# Patient Record
Sex: Male | Born: 1985 | Race: Black or African American | Hispanic: No | Marital: Single | Smoking: Current every day smoker
Health system: Southern US, Community
[De-identification: ages and names within clinical notes are randomized; demographics above are authoritative.]

## PROBLEM LIST (undated history)

## (undated) DIAGNOSIS — C189 Malignant neoplasm of colon, unspecified: Secondary | ICD-10-CM

## (undated) DIAGNOSIS — F319 Bipolar disorder, unspecified: Secondary | ICD-10-CM

## (undated) DIAGNOSIS — F4310 Post-traumatic stress disorder, unspecified: Secondary | ICD-10-CM

## (undated) HISTORY — DX: Malignant neoplasm of colon, unspecified: C18.9

---

## 2017-10-12 ENCOUNTER — Other Ambulatory Visit: Payer: Self-pay

## 2017-10-12 ENCOUNTER — Emergency Department
Admission: EM | Admit: 2017-10-12 | Discharge: 2017-10-12 | Disposition: A | Discharge status: Home / Self Care | Payer: Medicaid Other | Attending: Emergency Medicine | Admitting: Emergency Medicine

## 2017-10-12 ENCOUNTER — Encounter: Payer: Self-pay | Admitting: Emergency Medicine

## 2017-10-12 DIAGNOSIS — E860 Dehydration: Secondary | ICD-10-CM | POA: Diagnosis not present

## 2017-10-12 DIAGNOSIS — R55 Syncope and collapse: Secondary | ICD-10-CM

## 2017-10-12 DIAGNOSIS — F17210 Nicotine dependence, cigarettes, uncomplicated: Secondary | ICD-10-CM | POA: Insufficient documentation

## 2017-10-12 HISTORY — DX: Post-traumatic stress disorder, unspecified: F43.10

## 2017-10-12 HISTORY — DX: Bipolar disorder, unspecified: F31.9

## 2017-10-12 LAB — CBC
HCT: 42.8 % (ref 40.0–52.0)
HEMOGLOBIN: 14.5 g/dL (ref 13.0–18.0)
MCH: 31.5 pg (ref 26.0–34.0)
MCHC: 33.8 g/dL (ref 32.0–36.0)
MCV: 93.3 fL (ref 80.0–100.0)
Platelets: 238 10*3/uL (ref 150–440)
RBC: 4.59 MIL/uL (ref 4.40–5.90)
RDW: 12.9 % (ref 11.5–14.5)
WBC: 6.8 10*3/uL (ref 3.8–10.6)

## 2017-10-12 LAB — URINALYSIS, COMPLETE (UACMP) WITH MICROSCOPIC
Bacteria, UA: NONE SEEN
Bilirubin Urine: NEGATIVE
Glucose, UA: NEGATIVE mg/dL
Hgb urine dipstick: NEGATIVE
KETONES UR: NEGATIVE mg/dL
LEUKOCYTES UA: NEGATIVE
NITRITE: NEGATIVE
PH: 8 (ref 5.0–8.0)
Protein, ur: NEGATIVE mg/dL
Specific Gravity, Urine: 1.01 (ref 1.005–1.030)
Squamous Epithelial / LPF: NONE SEEN (ref 0–5)

## 2017-10-12 LAB — BASIC METABOLIC PANEL
ANION GAP: 6 (ref 5–15)
BUN: 10 mg/dL (ref 6–20)
CALCIUM: 8.7 mg/dL — AB (ref 8.9–10.3)
CHLORIDE: 108 mmol/L (ref 98–111)
CO2: 24 mmol/L (ref 22–32)
Creatinine, Ser: 1.06 mg/dL (ref 0.61–1.24)
Glucose, Bld: 123 mg/dL — ABNORMAL HIGH (ref 70–99)
POTASSIUM: 4 mmol/L (ref 3.5–5.1)
SODIUM: 138 mmol/L (ref 135–145)

## 2017-10-12 LAB — URINE DRUG SCREEN, QUALITATIVE (ARMC ONLY)
AMPHETAMINES, UR SCREEN: NOT DETECTED
BENZODIAZEPINE, UR SCRN: NOT DETECTED
COCAINE METABOLITE, UR ~~LOC~~: NOT DETECTED
Cannabinoid 50 Ng, Ur ~~LOC~~: NOT DETECTED
MDMA (Ecstasy)Ur Screen: NOT DETECTED
METHADONE SCREEN, URINE: NOT DETECTED
OPIATE, UR SCREEN: NOT DETECTED
PHENCYCLIDINE (PCP) UR S: NOT DETECTED
Tricyclic, Ur Screen: NOT DETECTED

## 2017-10-12 LAB — TROPONIN I: Troponin I: <0.03 ng/mL (ref <0.03)

## 2017-10-12 MED ORDER — SODIUM CHLORIDE 0.9 % IV BOLUS
1000.0000 mL | Freq: Once | INTRAVENOUS | Status: AC
  Administered 2017-10-12: 1000 mL via INTRAVENOUS

## 2017-10-12 NOTE — Discharge Instructions (Signed)

## 2017-10-12 NOTE — ED Notes (Signed)
Spoke with Dorris Singh from group home who states that pt is new to him but pt has told him that he has hx/o a seizure while he was in jail. Per [place], pt was not diagnosed with seizure disorder.

## 2017-10-12 NOTE — ED Notes (Signed)
Pt reports that this morning when he woke up that the was not feeling well. Pt states that he went outside to smoke and while sitting outside he had a syncopal episode. Pt reports that has had similar episodes in the past. Pt states that he is having generalized weakness but denies any pain currently. Pt is A & O x 4.

## 2017-10-12 NOTE — ED Triage Notes (Signed)
Pt to ED via [facility] from group home for syncopal episode this morning. Pt hypotensive on scene at 77/51, Sinus brady on monitor, c/o weakness and pain all over. CBG 117. Pt give 500 bolus in NS in route with blood pressure improving to 108/64 with HR in the mid 60's. Pt has hx/o PTSD.  Pt currently c/o weakness, in NAD at this time.

## 2017-10-12 NOTE — ED Notes (Signed)
Pt given sandwich tray and sprite.  

## 2017-10-12 NOTE — ED Provider Notes (Signed)
[facility] Emergency Department Provider Note  ____________________________________________   First MD Initiated Contact with Patient 10/12/17 [phone]     (approximate)  I have reviewed the triage vital signs and the nursing notes.   HISTORY  Chief Complaint Near Syncope    HPI Connor Wilkinson is a 32 y.o. male reports a previous history of seizure, but no other major history except for depression  Patient reports that he went outside, was sitting in a chair smoking a cigarette when he became lightheaded felt as though he was going to faint and then fainted.  Evidently he did not fall, EMS reports on scene he arrived and the patient was seated in a chair blood pressure 70/40 and his heart rate was also in the 40s.  He was somnolent at that time, but alert.  Patient reports he felt like he was going to blackout, felt very weak and tired during the episode which lasted a few minutes.  No chest pain or trouble breathing.  No fevers or chills.  No headache.  He did not bite his tongue, did not urinate on himself.  He also relates that over the last year he has had 2 other episodes one where he went to a hospital and reports he was told they did not find anything wrong, and then had another episode a few months back where he passed out similarly but did not seek medical care  Denies any fall or injury.  No headaches.  Not complaining of any chest pain or chest discomfort.  No fevers or chills  Patient reports he feels a whole lot better now has no complaints.  Was given 500 mL's of normal saline by EMS with improvement in heart rate and blood pressure.  EMS reports he seems much improved now.  Reports he was out yesterday in the hot sun fair amount, did not drink much water.  Also reports he had not had anything to eat or drink yet this morning.  Past Medical History:  Diagnosis Date  . Bipolar 1 disorder ([place])   . PTSD (post-traumatic stress disorder)      There are no active problems to display for this patient.  No leg swelling.  No history of blood clots.   Prior to Admission medications   Not on File    Allergies Patient has no allergy information on record.  No family history on file.  Social History Social History   Tobacco Use  . Smoking status: Current Every Day Smoker  . Smokeless tobacco: Never Used  Substance Use Topics  . Alcohol use: Not Currently  . Drug use: Not Currently  Denies drug use Reports he is a smoker  Review of Systems Constitutional: No fever/chills Eyes: No visual changes. ENT: No sore throat. Cardiovascular: Denies chest pain. Respiratory: Denies shortness of breath. Gastrointestinal: No abdominal pain.  No nausea, no vomiting.  No diarrhea.  No constipation. Genitourinary: Negative for dysuria. Musculoskeletal: Negative for back pain. Skin: Negative for rash. Neurological: Negative for headaches, focal weakness or numbness.  Reports he felt like his whole body felt fatigued this morning.    ____________________________________________   PHYSICAL EXAM:  VITAL SIGNS: ED Triage Vitals  Enc Vitals Group     BP      Pulse      Resp      Temp      Temp src      SpO2      Weight      Height  Head Circumference      Peak Flow      Pain Score      Pain Loc      Pain Edu?      Excl. in [place]?     Constitutional: Alert and oriented. Well appearing and in no acute distress. Eyes: Conjunctivae are normal. Head: Atraumatic. Nose: No congestion/rhinnorhea. Mouth/Throat: Mucous membranes are moist. Neck: No stridor.   Cardiovascular: Normal rate, regular rhythm. Grossly normal heart sounds.  Good peripheral circulation. Respiratory: Normal respiratory effort.  No retractions. Lungs CTAB. Gastrointestinal: Soft and nontender. No distention. Musculoskeletal: No lower extremity tenderness nor edema. Neurologic:  Normal speech and language. No gross focal neurologic deficits are  appreciated.  Skin:  Skin is warm, dry and intact. No rash noted. Psychiatric: Mood and affect are normal. Speech and behavior are normal.  ____________________________________________   LABS (all labs ordered are listed, but only abnormal results are displayed)  Labs Reviewed  BASIC METABOLIC PANEL - Abnormal; Notable for the following components:      Result Value   Glucose, Bld 123 (*)    Calcium 8.7 (*)    All other components within normal limits  URINALYSIS, COMPLETE (UACMP) WITH MICROSCOPIC - Abnormal; Notable for the following components:   Color, Urine YELLOW (*)    APPearance CLEAR (*)    All other components within normal limits  URINE DRUG SCREEN, QUALITATIVE ([facility] ONLY) - Abnormal; Notable for the following components:   Barbiturates, Ur Screen   (*)    Value: Result not available. Reagent lot number recalled by manufacturer.   All other components within normal limits  CBC  TROPONIN I   ____________________________________________  EKG  Reviewed enterotomy at 8:05 AM Heart rate 70 QRS 80 QTc 400 Normal sinus rhythm, mild ST elevation across majority of leads, appears likely early repolarization abnormality.  No convexity or ischemic changes denoted ____________________________________________  RADIOLOGY  No indication for imaging denoted at this time.  No chest pain no headache.  No focal deficits.  Symptoms seem inconsistent with seizure at this time. ____________________________________________   PROCEDURES  Procedure(s) performed: None  Procedures  Critical Care performed: No  ____________________________________________   INITIAL IMPRESSION / ASSESSMENT AND PLAN / ED COURSE  Pertinent labs & imaging results that were available during my care of the patient were reviewed by me and considered in my medical decision making (see chart for details).  Patient returns for evaluation after episode of syncope.  Seems to be associated with being  outside yesterday, feeling he did not drink enough fluid, and hypotension with some bradycardia on EMS arrival.  After fluids his vital signs have normalized and he reports he feels much improved.  He denies any seizure symptoms, did not bite his tongue, no incontinence, no headache.  Does not appear that there is a clear postictal state.  Does report syncope at least twice in the past, roughly in the last year.  EKG does not demonstrate any prolonged QT, [place] or notable electrocardiographic abnormality other than some mild what appears to be repolarization abnormality.  Is not complaining of any chest pain has no fever, does not appear consistent with acute pericarditis.  Does not appear consistent with acute ischemia either, patient denies any chest pain or other cardiac or pulmonary symptoms.    Clinical Course as of Oct 12 1129  Fri Oct 12, 2017  0908 Patient reports he is feeling better, but feels that he was going to stand up he probably little lightheaded.  Will give additional fluid and he is hungry requesting for some to eat this time.  Appears well.  Not complaining of any pain.  No headache no chest pain no trouble breathing.   [MQ]    Clinical Course User Index [MQ] Delman Kitten, MD   ----------------------------------------- 11:30 AM on 10/12/2017 -----------------------------------------  Patient ambulatory, no distress.  Reports he feels much improved.  Well-appearing with stable hemodynamics.  Suspect mild dehydration with likely vasovagal type syncope, no evidence of acute cardiac etiology.  Asymptomatic reassuring exam at this time.  Return precautions and treatment recommendations and follow-up discussed with the patient who is agreeable with the plan.   ____________________________________________   FINAL CLINICAL IMPRESSION(S) / ED DIAGNOSES  Final diagnoses:  Mild dehydration  Syncope and collapse      NEW MEDICATIONS STARTED DURING THIS VISIT:  New  Prescriptions   No medications on file     Note:  This document was prepared using Dragon voice recognition software and may include unintentional dictation errors.     Delman Kitten, MD 10/12/17 1131

## 2017-10-15 ENCOUNTER — Telehealth: Payer: Self-pay

## 2017-10-15 NOTE — Telephone Encounter (Signed)
Lmov for patient to call and schedule ED fu  Seen on 10/12/17 Will try again at a later time

## 2017-10-16 NOTE — Telephone Encounter (Signed)
Scheduled for 10/23/17 with Dr Rockey Situ

## 2017-10-19 DIAGNOSIS — R55 Syncope and collapse: Secondary | ICD-10-CM | POA: Insufficient documentation

## 2017-10-19 NOTE — Progress Notes (Signed)
Cardiology Office Note  Date:  10/23/2017   ID:  Connor Wilkinson, DOB 01-01-1986, MRN [id number]  PCP:  [name], MD   Chief Complaint  Patient presents with  . Other    ED follow up. Seen on 10/13/17 for syncope and collapse. Patient c/o being very weak. Meds reviewed verbally with patient.     HPI:  Connor Franta is a 32 y.o. male history of  seizure depression Smoker Who presents by referral from the emergency room for hypotension and syncope  He reports that he lost significant weight approximately 2 years ago Weight down from 200 pounds down to 180 pounds Since that time has struggled with near syncope and syncope Frequent episodes of near syncope as well as syncope  Recent episode was sitting in a chair smoking a cigarette, still up became lightheaded then fainted.   EMS reports  the patient was seated in a chair blood pressure 70/40  He was somnolent at that time, but alert. He reports this is a chronic issue In the emergency room was given IV fluids and discharged Reports he was out in the hot sun Since then has been trying to drink more fluids daily even when he wakes up but has still been symptomatic  Several other episodes detailed typically worse with standing associated with syncope Many episodes associated with near-syncope with standing Denies having significant symptoms from sitting or supine position Other episodes did not seek medical care  EKG personally reviewed by myself on todays visit Shows normal sinus rhythm with rate 67 bpm no significant ST or T-wave changes Repolarization abnormality noted  Orthostatics done in the office today show climb in heart rate from 67 up to 93 supine to standing position Drop in pressure 118/80 down to 99/64 with standing recovery 115/80 after 3 minutes   PMH:   has a past medical history of Bipolar 1 disorder ([facility]) and PTSD (post-traumatic stress disorder).  PSH:   History reviewed. No pertinent surgical  history.  Current Outpatient Medications  Medication Sig Dispense Refill  . benztropine (COGENTIN) 1 MG tablet Take 1 mg by mouth 2 (two) times daily.    . midodrine (PROAMATINE) 10 MG tablet Take 1 tablet (10 mg total) by mouth 3 (three) times daily as needed. 90 tablet 1  . prazosin (MINIPRESS) 1 MG capsule Take 1 capsule (1 mg total) by mouth at bedtime.     No current facility-administered medications for this visit.      Allergies:   Patient has no allergy information on record.   Social History:  The patient  reports that he has been smoking.  He has never used smokeless tobacco. He reports that he drank alcohol. He reports that he has current or past drug history.   Family History:   family history is not on file.    Review of Systems: Review of Systems  Constitutional: Negative.   Respiratory: Negative.   Cardiovascular: Negative.   Gastrointestinal: Negative.   Musculoskeletal: Negative.   Neurological: Positive for dizziness and loss of consciousness.  Psychiatric/Behavioral: Negative.   All other systems reviewed and are negative.    PHYSICAL EXAM: VS:  BP 123/83 (BP Location: Right Arm, Patient Position: Sitting, Cuff Size: Normal)   Pulse 67   Ht 6' (1.829 m)   Wt 187 lb 4 oz (84.9 kg)   BMI 25.40 kg/m  , BMI Body mass index is 25.4 kg/m. GEN: Well nourished, well developed, in no acute distress  HEENT: normal  Neck:  no JVD, carotid bruits, or masses Cardiac: RRR; no murmurs, rubs, or gallops,no edema  Respiratory:  clear to auscultation bilaterally, normal work of breathing GI: soft, nontender, nondistended, + BS MS: no deformity or atrophy  Skin: warm and dry, no rash Neuro:  Strength and sensation are intact Psych: euthymic mood, full affect   Recent Labs: 10/12/2017: BUN 10; Creatinine, Ser 1.06; Hemoglobin 14.5; Platelets 238; Potassium 4.0; Sodium 138    Lipid Panel No results found for: CHOL, HDL, LDLCALC, TRIG    Wt Readings from Last 3  Encounters:  10/23/17 187 lb 4 oz (84.9 kg)  10/12/17 170 lb (77.1 kg)       ASSESSMENT AND PLAN:  Syncope, unspecified syncope type - Plan: EKG 12-Lead Secondary to orthostasis Recommended he continue fluid loading, salt loading Compression hose back brace if needed Reports that he is doing much of this and continues to have symptoms Drop in pressure likely exacerbated by 20 pound weight loss 2 years ago He would like medication given he is very symptomatic Discussed midodrine and Florinef with him in detail We'll start midodrine at his request 5 mill grams 3 times a day titrating up to 10 mg 3 times a day if needed He likes be able to take this as needed  Orthostasis Plan as above Etiology of his symptoms possibly exacerbated by weight loss 2 years ago We'll try midodrine for symptom relief orthostatic on today's visit drop 20 points with standing  Weight loss Recommended he try not to lose any further weight  Nightmares Takes prazosin at nighttime This can potentially drop pressures but orthostasis symptoms presented prior to starting the medication  Seizures ([place]) Denies any recent seizures  Depression, unspecified depression type Management primary care On Cogentin  Disposition:   F/U as needed   Total encounter time more than 60 minutes  Greater than 50% was spent in counseling and coordination of care with the patient    Orders Placed This Encounter  Procedures  . EKG 12-Lead     Signed, Esmond Plants, M.D., Ph.D. 10/23/2017  [place], [place]

## 2017-10-23 ENCOUNTER — Encounter: Payer: Self-pay | Admitting: Cardiovascular Disease

## 2017-10-23 ENCOUNTER — Ambulatory Visit (INDEPENDENT_AMBULATORY_CARE_PROVIDER_SITE_OTHER): Payer: Medicaid Other | Admitting: Cardiovascular Disease

## 2017-10-23 DIAGNOSIS — R569 Unspecified convulsions: Secondary | ICD-10-CM

## 2017-10-23 DIAGNOSIS — R55 Syncope and collapse: Secondary | ICD-10-CM | POA: Diagnosis not present

## 2017-10-23 DIAGNOSIS — R634 Abnormal weight loss: Secondary | ICD-10-CM | POA: Diagnosis not present

## 2017-10-23 DIAGNOSIS — F32A Depression, unspecified: Secondary | ICD-10-CM

## 2017-10-23 DIAGNOSIS — I951 Orthostatic hypotension: Secondary | ICD-10-CM | POA: Insufficient documentation

## 2017-10-23 DIAGNOSIS — F329 Major depressive disorder, single episode, unspecified: Secondary | ICD-10-CM | POA: Diagnosis not present

## 2017-10-23 DIAGNOSIS — F515 Nightmare disorder: Secondary | ICD-10-CM | POA: Insufficient documentation

## 2017-10-23 MED ORDER — MIDODRINE HCL 10 MG PO TABS: 10.0000 mg | ORAL_TABLET | Freq: Three times a day (TID) | ORAL | 1 refills | Status: DC | PRN

## 2017-10-23 NOTE — Patient Instructions (Addendum)
Try not to lose weight Gain weight Stay hydrated Drink first thing in the morning Compression socks, back brace  Medication Instructions:   Please start midodrine 5 to 10 mg three times a day as needed  Labwork:  No new labs needed  Testing/Procedures:  No further testing at this time   Follow-Up: It was a pleasure seeing you in the office today. Please call us if you have new issues that need to be addressed before your next appt.  [phone]  Your physician wants you to follow-up in:  As needed  If you need a refill on your cardiac medications before your next appointment, please call your pharmacy.  For educational health videos Log in to : [email or web address] Or : [email or web address], password : [email or web address]

## 2017-11-29 ENCOUNTER — Other Ambulatory Visit: Payer: Self-pay | Admitting: *Deleted

## 2017-11-29 MED ORDER — MIDODRINE HCL 10 MG PO TABS: 10.0000 mg | ORAL_TABLET | Freq: Three times a day (TID) | ORAL | 2 refills | Status: DC | PRN

## 2021-07-05 ENCOUNTER — Inpatient Hospital Stay
Admission: EM | Admit: 2021-07-05 | Discharge: 2021-07-15 | DRG: 330 | Disposition: A | Discharge status: Nursing Home (Includes ICF) | Payer: Medicaid Other | Attending: Obstetrics and Gynecology | Admitting: Obstetrics and Gynecology

## 2021-07-05 ENCOUNTER — Other Ambulatory Visit: Payer: Self-pay

## 2021-07-05 ENCOUNTER — Encounter: Payer: Self-pay | Admitting: Internal Medicine

## 2021-07-05 DIAGNOSIS — D75838 Other thrombocytosis: Secondary | ICD-10-CM | POA: Diagnosis present

## 2021-07-05 DIAGNOSIS — D630 Anemia in neoplastic disease: Secondary | ICD-10-CM | POA: Diagnosis present

## 2021-07-05 DIAGNOSIS — C772 Secondary and unspecified malignant neoplasm of intra-abdominal lymph nodes: Secondary | ICD-10-CM | POA: Diagnosis present

## 2021-07-05 DIAGNOSIS — C184 Malignant neoplasm of transverse colon: Principal | ICD-10-CM | POA: Diagnosis present

## 2021-07-05 DIAGNOSIS — F4310 Post-traumatic stress disorder, unspecified: Secondary | ICD-10-CM | POA: Diagnosis present

## 2021-07-05 DIAGNOSIS — D509 Iron deficiency anemia, unspecified: Secondary | ICD-10-CM | POA: Diagnosis not present

## 2021-07-05 DIAGNOSIS — F17290 Nicotine dependence, other tobacco product, uncomplicated: Secondary | ICD-10-CM | POA: Diagnosis present

## 2021-07-05 DIAGNOSIS — C189 Malignant neoplasm of colon, unspecified: Secondary | ICD-10-CM

## 2021-07-05 DIAGNOSIS — F319 Bipolar disorder, unspecified: Secondary | ICD-10-CM | POA: Diagnosis present

## 2021-07-05 DIAGNOSIS — E611 Iron deficiency: Secondary | ICD-10-CM | POA: Diagnosis not present

## 2021-07-05 DIAGNOSIS — Z888 Allergy status to other drugs, medicaments and biological substances status: Secondary | ICD-10-CM

## 2021-07-05 DIAGNOSIS — F17200 Nicotine dependence, unspecified, uncomplicated: Secondary | ICD-10-CM | POA: Diagnosis present

## 2021-07-05 DIAGNOSIS — D508 Other iron deficiency anemias: Secondary | ICD-10-CM | POA: Diagnosis present

## 2021-07-05 DIAGNOSIS — K6389 Other specified diseases of intestine: Secondary | ICD-10-CM | POA: Diagnosis present

## 2021-07-05 DIAGNOSIS — Z806 Family history of leukemia: Secondary | ICD-10-CM

## 2021-07-05 DIAGNOSIS — D649 Anemia, unspecified: Principal | ICD-10-CM

## 2021-07-05 DIAGNOSIS — Z79899 Other long term (current) drug therapy: Secondary | ICD-10-CM

## 2021-07-05 LAB — HEPATIC FUNCTION PANEL
ALT: 9 U/L (ref 0–44)
AST: 14 U/L — ABNORMAL LOW (ref 15–41)
Albumin: 3.5 g/dL (ref 3.5–5.0)
Alkaline Phosphatase: 56 U/L (ref 38–126)
Bilirubin, Direct: <0.1 mg/dL (ref 0.0–0.2)
Total Bilirubin: 0.4 mg/dL (ref 0.3–1.2)
Total Protein: 6.5 g/dL (ref 6.5–8.1)

## 2021-07-05 LAB — CBC
HCT: 18.7 % — ABNORMAL LOW (ref 39.0–52.0)
Hemoglobin: 4.5 g/dL — CL (ref 13.0–17.0)
MCH: 15.2 pg — ABNORMAL LOW (ref 26.0–34.0)
MCHC: 24.1 g/dL — ABNORMAL LOW (ref 30.0–36.0)
MCV: 63 fL — ABNORMAL LOW (ref 80.0–100.0)
Platelets: 667 10*3/uL — ABNORMAL HIGH (ref 150–400)
RBC: 2.97 MIL/uL — ABNORMAL LOW (ref 4.22–5.81)
RDW: 19.9 % — ABNORMAL HIGH (ref 11.5–15.5)
WBC: 8.8 10*3/uL (ref 4.0–10.5)
nRBC: 0.3 % — ABNORMAL HIGH (ref 0.0–0.2)

## 2021-07-05 LAB — URINALYSIS, ROUTINE W REFLEX MICROSCOPIC
Bilirubin Urine: NEGATIVE
Glucose, UA: NEGATIVE mg/dL
Hgb urine dipstick: NEGATIVE
Ketones, ur: NEGATIVE mg/dL
Leukocytes,Ua: NEGATIVE
Nitrite: NEGATIVE
Protein, ur: NEGATIVE mg/dL
Specific Gravity, Urine: 1.024 (ref 1.005–1.030)
pH: 5 (ref 5.0–8.0)

## 2021-07-05 LAB — BASIC METABOLIC PANEL
Anion gap: 7 (ref 5–15)
BUN: 17 mg/dL (ref 6–20)
CO2: 25 mmol/L (ref 22–32)
Calcium: 8.6 mg/dL — ABNORMAL LOW (ref 8.9–10.3)
Chloride: 105 mmol/L (ref 98–111)
Creatinine, Ser: 0.9 mg/dL (ref 0.61–1.24)
GFR, Estimated: >60 mL/min (ref >60)
Glucose, Bld: 119 mg/dL — ABNORMAL HIGH (ref 70–99)
Potassium: 4 mmol/L (ref 3.5–5.1)
Sodium: 137 mmol/L (ref 135–145)

## 2021-07-05 LAB — IRON AND TIBC
Iron: 9 ug/dL — ABNORMAL LOW (ref 45–182)
Saturation Ratios: 2 % — ABNORMAL LOW (ref 17.9–39.5)
TIBC: 428 ug/dL (ref 250–450)
UIBC: 419 ug/dL

## 2021-07-05 LAB — FOLATE: Folate: 17.2 ng/mL (ref >5.9)

## 2021-07-05 LAB — FERRITIN: Ferritin: 1 ng/mL — ABNORMAL LOW (ref 24–336)

## 2021-07-05 LAB — PREPARE RBC (CROSSMATCH)

## 2021-07-05 LAB — LACTATE DEHYDROGENASE: LDH: 83 U/L — ABNORMAL LOW (ref 98–192)

## 2021-07-05 LAB — ABO/RH: ABO/RH(D): O POS

## 2021-07-05 MED ORDER — ACETAMINOPHEN 325 MG PO TABS: 650.0000 mg | ORAL_TABLET | Freq: Four times a day (QID) | ORAL | Status: DC | PRN

## 2021-07-05 MED ORDER — PRAZOSIN HCL 1 MG PO CAPS
2.0000 mg | ORAL_CAPSULE | Freq: Two times a day (BID) | ORAL | Status: DC
  Administered 2021-07-05 – 2021-07-15 (×20): 2 mg via ORAL
  Filled 2021-07-05 (×10): qty 2
  Filled 2021-07-05: qty 1
  Filled 2021-07-05 (×10): qty 2

## 2021-07-05 MED ORDER — BUSPIRONE HCL 10 MG PO TABS
10.0000 mg | ORAL_TABLET | Freq: Two times a day (BID) | ORAL | Status: DC
  Administered 2021-07-05 – 2021-07-15 (×20): 10 mg via ORAL
  Filled 2021-07-05 (×20): qty 1

## 2021-07-05 MED ORDER — BENZTROPINE MESYLATE 1 MG PO TABS
2.0000 mg | ORAL_TABLET | Freq: Every day | ORAL | Status: DC
  Administered 2021-07-05 – 2021-07-14 (×10): 2 mg via ORAL
  Filled 2021-07-05 (×10): qty 2

## 2021-07-05 MED ORDER — SODIUM CHLORIDE 0.9 % IV SOLN: 250.0000 mL | INTRAVENOUS | Status: DC | PRN

## 2021-07-05 MED ORDER — POLYETHYLENE GLYCOL 3350 17 GM/SCOOP PO POWD
1.0000 | Freq: Once | ORAL | Status: AC
  Administered 2021-07-05: 255 g via ORAL
  Filled 2021-07-05 (×2): qty 255

## 2021-07-05 MED ORDER — SODIUM CHLORIDE 0.9% FLUSH: 3.0000 mL | INTRAVENOUS | Status: DC | PRN

## 2021-07-05 MED ORDER — FLUOXETINE HCL 20 MG PO CAPS
20.0000 mg | ORAL_CAPSULE | Freq: Every day | ORAL | Status: DC
  Administered 2021-07-06 – 2021-07-15 (×10): 20 mg via ORAL
  Filled 2021-07-05 (×10): qty 1

## 2021-07-05 MED ORDER — NICOTINE POLACRILEX 2 MG MT GUM
2.0000 mg | CHEWING_GUM | OROMUCOSAL | Status: DC | PRN
  Administered 2021-07-05 – 2021-07-15 (×22): 2 mg via ORAL
  Filled 2021-07-05 (×31): qty 1

## 2021-07-05 MED ORDER — SODIUM CHLORIDE 0.9 % IV SOLN: 10.0000 mL/h | Freq: Once | INTRAVENOUS | Status: DC

## 2021-07-05 MED ORDER — ONDANSETRON HCL 4 MG PO TABS: 4.0000 mg | ORAL_TABLET | Freq: Four times a day (QID) | ORAL | Status: DC | PRN

## 2021-07-05 MED ORDER — ONDANSETRON HCL 4 MG/2ML IJ SOLN: 4.0000 mg | Freq: Four times a day (QID) | INTRAMUSCULAR | Status: DC | PRN

## 2021-07-05 MED ORDER — ARIPIPRAZOLE 10 MG PO TABS
10.0000 mg | ORAL_TABLET | Freq: Every day | ORAL | Status: DC
  Administered 2021-07-06 – 2021-07-15 (×10): 10 mg via ORAL
  Filled 2021-07-05 (×11): qty 1

## 2021-07-05 MED ORDER — SODIUM CHLORIDE 0.9% FLUSH
3.0000 mL | Freq: Two times a day (BID) | INTRAVENOUS | Status: DC
  Administered 2021-07-05 – 2021-07-12 (×15): 3 mL via INTRAVENOUS

## 2021-07-05 MED ORDER — ACETAMINOPHEN 650 MG RE SUPP: 650.0000 mg | Freq: Four times a day (QID) | RECTAL | Status: DC | PRN

## 2021-07-05 MED ORDER — SODIUM CHLORIDE 0.9 % IV SOLN: INTRAVENOUS | Status: DC

## 2021-07-05 NOTE — Assessment & Plan Note (Addendum)
Status post 4 units total packed red blood cells.  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  Underwent iron transfusion on 3/23.  Hemoglobin 8.6. Overall, anemia likely should improve once mass is resected.  IV iron and blood transfusions as needed before surgery to get hemoglobin > 8.5. ?

## 2021-07-05 NOTE — Assessment & Plan Note (Signed)
Smoking cessation has been discussed with patient in detail ?We will start him on nicotine gum 2 mg as needed ?

## 2021-07-05 NOTE — Assessment & Plan Note (Addendum)
Stable ?Continue Abilify, Cogentin, fluoxetine and buspirone.  Patient is a ward of the state and decisions to be made through his guardian. ?

## 2021-07-05 NOTE — Progress Notes (Signed)
Admission profile updated. ?

## 2021-07-05 NOTE — Consult Note (Signed)
? ? ? ?Connor Darby, MD ?[address]  ?[address]  ?[place], [name] [address]  ?Main: [phone]  ?Fax: [phone] ?Pager: [phone] ? ? Consultation ? ?Referring Provider:     No ref. provider found ?Primary Care Physician:  [name], MD ?Primary Gastroenterologist: [name]    ?Reason for Consultation:     Severe iron deficiency anemia ? ?Date of Admission:  07/05/2021 ?Date of Consultation:  07/05/2021 ?       ? HPI:   ?Connor Wilkinson is a 36 y.o. male with history of bipolar, PTSD is admitted with low hemoglobin.  He got his labs checked as outpatient, found to have hemoglobin of 4, therefore he was advised to go to the ER.  Patient reports generalized weakness, severe fatigue, lightheadedness over the last few weeks, also has unusual cravings to eat ice.  Patient denies any vomiting, abdominal pain, hematemesis, melena, rectal bleeding, weight loss.  He states that he has not been eating well in the last 2 weeks.  Repeat hemoglobin in the ER was 4.5, MCV 63, ferritin 1, serum iron 9, reactive thrombocytosis 667.  Hemoglobin was 14 in 2019, patient is admitted, receiving blood transfusion, GI is consulted for severe iron deficiency anemia. ? ?Patient denies any family history of GI malignancy ? ?NSAIDs: None ? ?Antiplts/Anticoagulants/Anti thrombotics: None ? ?GI Procedures: None ? ?Past Medical History:  ?Diagnosis Date  ? Bipolar 1 disorder ([place])   ? PTSD (post-traumatic stress disorder)   ? ? ?History reviewed. No pertinent surgical history. ? ?Prior to Admission medications   ?Medication Sig Start Date End Date Taking? Authorizing Provider  ?ARIPiprazole (ABILIFY) 10 MG tablet Take 10 mg by mouth daily.   Yes [provider]  ?ARIPiprazole ER (ABILIFY MAINTENA) 400 MG PRSY prefilled syringe Inject 400 mg into the muscle every 21 ( twenty-one) days.   Yes [provider]  ?benztropine (COGENTIN) 2 MG tablet Take 2 mg by mouth at bedtime.   Yes [provider]   ?busPIRone (BUSPAR) 10 MG tablet Take 10 mg by mouth 2 (two) times daily.   Yes [provider]  ?FLUoxetine (PROZAC) 20 MG capsule Take 20 mg by mouth daily.   Yes [provider]  ?prazosin (MINIPRESS) 2 MG capsule Take 2 mg by mouth 2 (two) times daily.   Yes [name], MD  ? ? ?Current Facility-Administered Medications:  ?  0.9 %  sodium chloride infusion, 10 mL/hr, Intravenous, Once, [name], MD ?  0.9 %  sodium chloride infusion, 250 mL, Intravenous, PRN, Agbata, Tochukwu, MD ?  0.9 %  sodium chloride infusion, , Intravenous, Continuous, [name], Tally Due, MD ?  acetaminophen (TYLENOL) tablet 650 mg, 650 mg, Oral, Q6H PRN **OR** acetaminophen (TYLENOL) suppository 650 mg, 650 mg, Rectal, Q6H PRN, Agbata, Tochukwu, MD ?  ARIPiprazole (ABILIFY) tablet 10 mg, 10 mg, Oral, Daily, Agbata, Tochukwu, MD ?  benztropine (COGENTIN) tablet 2 mg, 2 mg, Oral, QHS, Agbata, Tochukwu, MD ?  busPIRone (BUSPAR) tablet 10 mg, 10 mg, Oral, BID, Agbata, Tochukwu, MD ?  FLUoxetine (PROZAC) capsule 20 mg, 20 mg, Oral, Daily, Agbata, Tochukwu, MD ?  nicotine polacrilex (NICORETTE) gum 2 mg, 2 mg, Oral, PRN, Agbata, Tochukwu, MD ?  ondansetron (ZOFRAN) tablet 4 mg, 4 mg, Oral, Q6H PRN **OR** ondansetron (ZOFRAN) injection 4 mg, 4 mg, Intravenous, Q6H PRN, Agbata, Tochukwu, MD ?  polyethylene glycol powder (GLYCOLAX/MIRALAX) container 255 g, 1 Container, Oral, Once, [name], Tally Due, MD ?  prazosin (MINIPRESS)  capsule 2 mg, 2 mg, Oral, BID, Agbata, Tochukwu, MD ?  sodium chloride flush (NS) 0.9 % injection 3 mL, 3 mL, Intravenous, Q12H, Agbata, Tochukwu, MD, 3 mL at 07/05/21 1455 ?  sodium chloride flush (NS) 0.9 % injection 3 mL, 3 mL, Intravenous, PRN, Agbata, Tochukwu, MD ? ?History reviewed. No pertinent family history.  ? ?Social History  ? ?Tobacco Use  ? Smoking status: Every Day  ? Smokeless tobacco: Never  ?Vaping Use  ? Vaping Use: Every day  ?Substance Use Topics  ? Alcohol use: Not  Currently  ? Drug use: Not Currently  ? ? ?Allergies as of 07/05/2021 - Review Complete 07/05/2021  ?Allergen Reaction Noted  ? Geodon [ziprasidone hcl] Anaphylaxis 07/05/2021  ? ? ?Review of Systems:    ?All systems reviewed and negative except where noted in HPI. ? ? Physical Exam:  ?Vital signs in last 24 hours: ?Temp:  [98.2 ?F (36.8 ?C)-98.3 ?F (36.8 ?C)] 98.2 ?F (36.8 ?C) (03/21 1445) ?Pulse Rate:  [76-106] 76 (03/21 1445) ?Resp:  [15-17] 16 (03/21 1445) ?BP: (112-129)/(63-75) 115/65 (03/21 1445) ?SpO2:  [95 %-100 %] 100 % (03/21 1445) ?Weight:  [79.4 kg] 79.4 kg (03/21 1006) ?Last BM Date : 07/03/21 ?General:   Pleasant, cooperative in NAD ?Head:  Normocephalic and atraumatic. ?Eyes:   No icterus.   Conjunctiva pale. PERRLA. ?Ears:  Normal auditory acuity. ?Neck:  Supple; no masses or thyroidomegaly ?Lungs: Respirations even and unlabored. Lungs clear to auscultation bilaterally.   No wheezes, crackles, or rhonchi.  ?Heart:  Regular rate and rhythm;  Without murmur, clicks, rubs or gallops ?Abdomen:  Soft, nondistended, nontender. Normal bowel sounds. No appreciable masses or hepatomegaly.  No rebound or guarding.  ?Rectal:  Not performed. ?Msk:  Symmetrical without gross deformities.  Strength generalized weakness ?Extremities:  Without edema, cyanosis or clubbing. ?Neurologic:  Alert and oriented x3;  grossly normal neurologically. ?Skin:  Intact without significant lesions or rashes. ?Psych:  Alert and cooperative. Normal affect. ? ?LAB RESULTS: ?CBC Latest Ref Rng & Units 07/05/2021 10/12/2017  ?WBC 4.0 - 10.5 K/uL 8.8 6.8  ?Hemoglobin 13.0 - 17.0 g/dL 4.5(LL) 14.5  ?Hematocrit 39.0 - 52.0 % 18.7(L) 42.8  ?Platelets 150 - 400 K/uL 667(H) 238  ? ? ?BMET ?BMP Latest Ref Rng & Units 07/05/2021 10/12/2017  ?Glucose 70 - 99 mg/dL 119(H) 123(H)  ?BUN 6 - 20 mg/dL 17 10  ?Creatinine 0.61 - 1.24 mg/dL 0.90 1.06  ?Sodium 135 - 145 mmol/L 137 138  ?Potassium 3.5 - 5.1 mmol/L 4.0 4.0  ?Chloride 98 - 111 mmol/L 105 108   ?CO2 22 - 32 mmol/L 25 24  ?Calcium 8.9 - 10.3 mg/dL 8.6(L) 8.7(L)  ? ? ?LFT ?Hepatic Function Latest Ref Rng & Units 07/05/2021  ?Total Protein 6.5 - 8.1 g/dL 6.5  ?Albumin 3.5 - 5.0 g/dL 3.5  ?AST 15 - 41 U/L 14(L)  ?ALT 0 - 44 U/L 9  ?Alk Phosphatase 38 - 126 U/L 56  ?Total Bilirubin 0.3 - 1.2 mg/dL 0.4  ?Bilirubin, Direct 0.0 - 0.2 mg/dL <0.1  ? ? ? ?STUDIES: ?No results found. ? ? ? Impression / Plan:  ? ?Connor Neglia is a 36 y.o. male with history of bipolar, PTSD is admitted with severe symptomatic iron deficiency anemia with no evidence of active GI bleed ? ?Severe iron deficiency anemia: ?No evidence of active GI bleed ?Blood transfusion to maintain hemoglobin above 7, currently receiving first unit of PRBCs ?Recommend parenteral iron therapy ?Check B12 and folate  levels, TSH levels ?Recommend upper endoscopy as well as colonoscopy +/- VCE for further evaluation, plan for tomorrow ?Continue clear liquid diet ?Bowel prep today, n.p.o. effective 5 AM tomorrow ?If GI work-up is negative, consult hematology for further evaluation ? ?Thank you for involving me in the care of this patient.   ? ? ? LOS: 0 days  ? ?Sherri Sear, MD  07/05/2021, 4:34 PM ? ? ? Note: This dictation was prepared with Dragon dictation along with smaller phrase technology. Any transcriptional errors that result from this process are unintentional.  ? ?

## 2021-07-05 NOTE — ED Notes (Signed)
Informed RN bed assigned 

## 2021-07-05 NOTE — ED Provider Notes (Signed)
? ?[facility] ?Provider Note ? ? ? Event Date/Time  ? First MD Initiated Contact with Patient 07/05/21 1100   ?  (approximate) ? ? ?History  ? ?abnormal labs and Weakness ? ? ?HPI ? ?Connor Wilkinson is a 36 y.o. male   with a history of bipolar disorder who is sent to the ED today due to abnormally low hemoglobin on outpatient labs performed yesterday.  He does report that he has been having generalized weakness, severe fatigue, lightheadedness with standing over the past week or 2.  He also has unusual cravings to eat ice. ? ?Denies any vomiting, no abdominal pain or black or bloody stool.  No recent wounds or hemorrhage.  No fever or chills. ? ?  ? ? ?Physical Exam  ? ?Triage Vital Signs: ?ED Triage Vitals  ?Enc Vitals Group  ?   BP 07/05/21 1005 129/70  ?   Pulse Rate 07/05/21 1005 (!) 106  ?   Resp 07/05/21 1005 16  ?   Temp 07/05/21 1005 98.3 ?F (36.8 ?C)  ?   Temp Source 07/05/21 1005 Oral  ?   SpO2 07/05/21 1005 98 %  ?   Weight 07/05/21 1006 175 lb (79.4 kg)  ?   Height 07/05/21 1006 6' (1.829 m)  ?   Head Circumference --   ?   Peak Flow --   ?   Pain Score 07/05/21 1006 0  ?   Pain Loc --   ?   Pain Edu? --   ?   Excl. in [place]? --   ? ? ?Most recent vital signs: ?Vitals:  ? 07/05/21 1100 07/05/21 1130  ?BP: 116/63 113/65  ?Pulse: 85 83  ?Resp: 15 15  ?Temp:    ?SpO2: 100% 100%  ? ? ? ?General: Awake, no distress.  ?CV:  Good peripheral perfusion.  Regular rate and rhythm ?Resp:  Normal effort.  Clear to auscultation bilaterally ?Abd:  No distention.  Soft nontender ?Other:  No jaundice, no rash, no petechia. ? ? ?ED Results / Procedures / Treatments  ? ?Labs ?(all labs ordered are listed, but only abnormal results are displayed) ?Labs Reviewed  ?BASIC METABOLIC PANEL - Abnormal; Notable for the following components:  ?    Result Value  ? Glucose, Bld 119 (*)   ? Calcium 8.6 (*)   ? All other components within normal limits  ?CBC - Abnormal; Notable for the following components:  ? RBC  2.97 (*)   ? Hemoglobin 4.5 (*)   ? HCT 18.7 (*)   ? MCV 63.0 (*)   ? MCH 15.2 (*)   ? MCHC 24.1 (*)   ? RDW 19.9 (*)   ? Platelets 667 (*)   ? nRBC 0.3 (*)   ? All other components within normal limits  ?URINALYSIS, ROUTINE W REFLEX MICROSCOPIC - Abnormal; Notable for the following components:  ? Color, Urine YELLOW (*)   ? APPearance CLEAR (*)   ? All other components within normal limits  ?IRON AND TIBC - Abnormal; Notable for the following components:  ? Iron 9 (*)   ? Saturation Ratios 2 (*)   ? All other components within normal limits  ?FERRITIN - Abnormal; Notable for the following components:  ? Ferritin 1 (*)   ? All other components within normal limits  ?HEPATIC FUNCTION PANEL - Abnormal; Notable for the following components:  ? AST 14 (*)   ? All other components within normal limits  ?LACTATE DEHYDROGENASE -  Abnormal; Notable for the following components:  ? LDH 83 (*)   ? All other components within normal limits  ?HAPTOGLOBIN  ?PREPARE RBC (CROSSMATCH)  ?TYPE AND SCREEN  ?ABO/RH  ? ? ? ?EKG ? ? ? ? ?RADIOLOGY ? ? ? ? ?PROCEDURES: ? ?Critical Care performed: Yes, see critical care procedure note(s) ? ?.Critical Care ?Performed by: [name], MD ?Authorized by: [name], MD  ? ?Critical care provider statement:  ?  Critical care time (minutes):  35 ?  Critical care time was exclusive of:  Separately billable procedures and treating other patients ?  Critical care was necessary to treat or prevent imminent or life-threatening deterioration of the following conditions:  Circulatory failure ?  Critical care was time spent personally by me on the following activities:  Development of treatment plan with patient or surrogate, discussions with consultants, evaluation of patient's response to treatment, examination of patient, obtaining history from patient or surrogate, ordering and performing treatments and interventions, ordering and review of laboratory studies, ordering and review of  radiographic studies, pulse oximetry, re-evaluation of patient's condition and review of old charts ? ? ?MEDICATIONS ORDERED IN ED: ?Medications  ?0.9 %  sodium chloride infusion (10 mL/hr Intravenous Not Given 07/05/21 1114)  ? ? ? ?IMPRESSION / MDM / ASSESSMENT AND PLAN / ED COURSE  ?I reviewed the triage vital signs and the nursing notes. ?             ?               ? ?Differential diagnosis includes, but is not limited to, iron deficiency anemia, hemolysis, hematologic malignancy ? ?Patient presents with dizziness, found to have a hemoglobin of 4.5.  Consents to transfusion for symptomatic anemia.  Iron panel shows complete iron depletion.  Bilirubin is normal, LDH is normal, doubt hemolysis.  No signs or symptoms of hemorrhage.  No signs or symptoms of infection.  Discussed with hospitalist for further management. ? ? ?  ? ? ?FINAL CLINICAL IMPRESSION(S) / ED DIAGNOSES  ? ?Final diagnoses:  ?Symptomatic anemia  ?Iron deficiency  ? ? ? ?Rx / DC Orders  ? ?ED Discharge Orders   ? ? None  ? ?  ? ? ? ?Note:  This document was prepared using Dragon voice recognition software and may include unintentional dictation errors. ?  ?[name], MD ?07/05/21 1240 ? ?

## 2021-07-05 NOTE — H&P (Signed)
?History and Physical  ? ? ?Patient: Connor Wilkinson IOE:[id number] DOB: [birth year] ?DOA: 07/05/2021 ?DOS: the patient was seen and examined on 07/05/2021 ?PCP: [name], MD  ?Patient coming from: Home ? ?Chief Complaint:  ?Chief Complaint  ?Patient presents with  ? abnormal labs  ? Weakness  ? ?HPI: Connor Arko is a 36 y.o. male with medical history significant for bipolar disorder, PTSD, nicotine dependence who currently resides in a group home and was referred to the ER for evaluation of low hemoglobin. ?Patient states that he has felt weak and fatigued for several weeks and had blood work done which revealed a hemoglobin of 4 and potassium of 5.3. ?He denies having any hematemesis, no hematochezia, no passage of melena stools, no chest pain, no shortness of breath, no dizziness, no lightheadedness, no falls, no loss of consciousness or NSAID use. ?He states that his father has a history of kidney cancer and he has an aunt with leukemia but no family history of colon cancer. ?Repeat labs in the ER showed a hemoglobin of 4.5g/dl, MCV of 63, platelet count of 667, serum iron of 9, ferritin of 1 ?He will be referred to observation status for further evaluation ?Review of Systems: As mentioned in the history of present illness. All other systems reviewed and are negative. ?Past Medical History:  ?Diagnosis Date  ? Bipolar 1 disorder ([place])   ? PTSD (post-traumatic stress disorder)   ? ?No past surgical history on file. ?Social History:  reports that he has been smoking. He has never used smokeless tobacco. He reports that he does not currently use alcohol. He reports that he does not currently use drugs. ? ?Allergies  ?Allergen Reactions  ? Geodon [Ziprasidone Hcl] Anaphylaxis  ? ? ?No family history on file. ? ?Prior to Admission medications   ?Medication Sig Start Date End Date Taking? Authorizing Provider  ?ARIPiprazole (ABILIFY) 10 MG tablet Take 10 mg by mouth daily.   Yes [provider]   ?ARIPiprazole ER (ABILIFY MAINTENA) 400 MG PRSY prefilled syringe Inject 400 mg into the muscle every 21 ( twenty-one) days.   Yes [provider]  ?benztropine (COGENTIN) 2 MG tablet Take 2 mg by mouth at bedtime.   Yes [provider]  ?busPIRone (BUSPAR) 10 MG tablet Take 10 mg by mouth 2 (two) times daily.   Yes [provider]  ?FLUoxetine (PROZAC) 20 MG capsule Take 20 mg by mouth daily.   Yes [provider]  ?prazosin (MINIPRESS) 2 MG capsule Take 2 mg by mouth 2 (two) times daily.   Yes [name], MD  ? ? ?Physical Exam: ?Vitals:  ? 07/05/21 1006 07/05/21 1100 07/05/21 1130 07/05/21 1200  ?BP:  116/63 113/65 118/68  ?Pulse:  85 83 91  ?Resp:  '15 15 15  '$ ?Temp:      ?TempSrc:      ?SpO2:  100% 100% 100%  ?Weight: 79.4 kg     ?Height: 6' (1.829 m)     ? ?Physical Exam ?Vitals and nursing note reviewed.  ?Constitutional:   ?   Appearance: Normal appearance. He is normal weight.  ?HENT:  ?   Head: Normocephalic.  ?   Nose: Nose normal.  ?   Mouth/Throat:  ?   Mouth: Mucous membranes are moist.  ?Eyes:  ?   Comments: Conjunctival pallor  ?Cardiovascular:  ?   Rate and Rhythm: Normal rate and regular rhythm.  ?   Pulses: Normal pulses.  ?Pulmonary:  ?  Effort: Pulmonary effort is normal.  ?   Breath sounds: Normal breath sounds.  ?Abdominal:  ?   General: Abdomen is flat. Bowel sounds are normal.  ?   Palpations: Abdomen is soft.  ?Musculoskeletal:     ?   General: Normal range of motion.  ?   Cervical back: Normal range of motion and neck supple.  ?Skin: ?   General: Skin is warm and dry.  ?Neurological:  ?   General: No focal deficit present.  ?   Mental Status: He is alert and oriented to person, place, and time.  ?Psychiatric:     ?   Mood and Affect: Mood normal.     ?   Behavior: Behavior normal.  ? ? ?Data Reviewed: ?Relevant notes from primary care and specialist visits, past discharge summaries as available in EHR, including Care Everywhere. ?Prior diagnostic  testing as pertinent to current admission diagnoses ?Updated medications and problem lists for reconciliation ?ED course, including vitals, labs, imaging, treatment and response to treatment ?Triage notes, nursing and pharmacy notes and ED provider's notes ?Notable results as noted in HPI ?Labs reviewed and essentially negative except for LDH 83, iron 9, total iron binding capacity 428, ferritin 1, hemoglobin 4.5, hematocrit 18.7, MCV 63, RDW 19.9, platelet count 667 ?Urine analysis sterile ?There are no new results to review at this time. ? ?Assessment and Plan: ?* Symptomatic anemia ?Patient presents for evaluation of weakness and fatigue admitted to have a hemoglobin of 4.5g/dl ?No obvious source of blood loss at this time ?He has macrocytosis with a low serum iron and ferritin level ?We will transfuse 2 units of packed RBC ?We will consult GI for further evaluation ? ?Nicotine dependence ?Smoking cessation has been discussed with patient in detail ?We will start him on nicotine gum 2 mg as needed ? ?Bipolar 1 disorder ([place]) ?Stable ?Continue Abilify, Cogentin, fluoxetine and buspirone ? ? ? ? ? Advance Care Planning:   Code Status: Full Code  ? ?Consults: Gastroenterology ? ?Family Communication: Greater than 50% of time was spent discussing patient's condition and plan of care with him at the bedside.  All questions and concerns have been addressed.  He verbalizes understanding and agrees with the plan. ? ?Severity of Illness: ?The appropriate patient status for this patient is OBSERVATION. Observation status is judged to be reasonable and necessary in order to provide the required intensity of service to ensure the patient's safety. The patient's presenting symptoms, physical exam findings, and initial radiographic and laboratory data in the context of their medical condition is felt to place them at decreased risk for further clinical deterioration. Furthermore, it is anticipated that the patient will be  medically stable for discharge from the hospital within 2 midnights of admission.  ? ?Author: ?Collier Bullock, MD ?07/05/2021 1:16 PM ? ?For on call review [email or web address].  ?

## 2021-07-05 NOTE — ED Triage Notes (Signed)
Pt reports that he had blood work done yesterday to check his levels due to his psych medications, pt states that he was notified that his hgb was critically low today, result of 4 and his potassium is 5.3, pt told to get it rechecked, pt does state for the past week he has had weakness and craving to drink a lot of water and gatorade and feeling like he was going to pass out ?

## 2021-07-06 ENCOUNTER — Observation Stay: Payer: Medicaid Other | Admitting: Certified Registered"

## 2021-07-06 ENCOUNTER — Encounter
Admission: EM | Disposition: A | Discharge status: Nursing Home (Includes ICF) | Payer: Self-pay | Source: Home / Self Care | Attending: Internal Medicine

## 2021-07-06 ENCOUNTER — Encounter: Payer: Self-pay | Admitting: Internal Medicine

## 2021-07-06 DIAGNOSIS — D509 Iron deficiency anemia, unspecified: Secondary | ICD-10-CM

## 2021-07-06 DIAGNOSIS — F17290 Nicotine dependence, other tobacco product, uncomplicated: Secondary | ICD-10-CM | POA: Diagnosis present

## 2021-07-06 DIAGNOSIS — D649 Anemia, unspecified: Secondary | ICD-10-CM | POA: Diagnosis not present

## 2021-07-06 DIAGNOSIS — D500 Iron deficiency anemia secondary to blood loss (chronic): Secondary | ICD-10-CM | POA: Diagnosis not present

## 2021-07-06 DIAGNOSIS — F319 Bipolar disorder, unspecified: Secondary | ICD-10-CM

## 2021-07-06 DIAGNOSIS — C189 Malignant neoplasm of colon, unspecified: Secondary | ICD-10-CM | POA: Diagnosis not present

## 2021-07-06 DIAGNOSIS — E611 Iron deficiency: Secondary | ICD-10-CM

## 2021-07-06 DIAGNOSIS — Z806 Family history of leukemia: Secondary | ICD-10-CM | POA: Diagnosis not present

## 2021-07-06 DIAGNOSIS — Z79899 Other long term (current) drug therapy: Secondary | ICD-10-CM | POA: Diagnosis not present

## 2021-07-06 DIAGNOSIS — C184 Malignant neoplasm of transverse colon: Secondary | ICD-10-CM | POA: Diagnosis present

## 2021-07-06 DIAGNOSIS — K6389 Other specified diseases of intestine: Secondary | ICD-10-CM | POA: Diagnosis not present

## 2021-07-06 DIAGNOSIS — D508 Other iron deficiency anemias: Secondary | ICD-10-CM | POA: Diagnosis present

## 2021-07-06 DIAGNOSIS — Z888 Allergy status to other drugs, medicaments and biological substances status: Secondary | ICD-10-CM | POA: Diagnosis not present

## 2021-07-06 DIAGNOSIS — D630 Anemia in neoplastic disease: Secondary | ICD-10-CM | POA: Diagnosis present

## 2021-07-06 DIAGNOSIS — C772 Secondary and unspecified malignant neoplasm of intra-abdominal lymph nodes: Secondary | ICD-10-CM | POA: Diagnosis present

## 2021-07-06 DIAGNOSIS — D75838 Other thrombocytosis: Secondary | ICD-10-CM | POA: Diagnosis present

## 2021-07-06 DIAGNOSIS — F17210 Nicotine dependence, cigarettes, uncomplicated: Secondary | ICD-10-CM | POA: Diagnosis not present

## 2021-07-06 DIAGNOSIS — F4310 Post-traumatic stress disorder, unspecified: Secondary | ICD-10-CM | POA: Diagnosis present

## 2021-07-06 HISTORY — PX: GIVENS CAPSULE STUDY: SHX5432

## 2021-07-06 HISTORY — PX: ESOPHAGOGASTRODUODENOSCOPY (EGD) WITH PROPOFOL: SHX5813

## 2021-07-06 HISTORY — PX: COLONOSCOPY WITH PROPOFOL: SHX5780

## 2021-07-06 LAB — BASIC METABOLIC PANEL
Anion gap: 4 — ABNORMAL LOW (ref 5–15)
BUN: 12 mg/dL (ref 6–20)
CO2: 27 mmol/L (ref 22–32)
Calcium: 8.8 mg/dL — ABNORMAL LOW (ref 8.9–10.3)
Chloride: 107 mmol/L (ref 98–111)
Creatinine, Ser: 0.88 mg/dL (ref 0.61–1.24)
GFR, Estimated: >60 mL/min (ref >60)
Glucose, Bld: 90 mg/dL (ref 70–99)
Potassium: 4.2 mmol/L (ref 3.5–5.1)
Sodium: 138 mmol/L (ref 135–145)

## 2021-07-06 LAB — CBC
HCT: 25.3 % — ABNORMAL LOW (ref 39.0–52.0)
Hemoglobin: 6.9 g/dL — ABNORMAL LOW (ref 13.0–17.0)
MCH: 18.2 pg — ABNORMAL LOW (ref 26.0–34.0)
MCHC: 27.3 g/dL — ABNORMAL LOW (ref 30.0–36.0)
MCV: 66.8 fL — ABNORMAL LOW (ref 80.0–100.0)
Platelets: 593 10*3/uL — ABNORMAL HIGH (ref 150–400)
RBC: 3.79 MIL/uL — ABNORMAL LOW (ref 4.22–5.81)
RDW: 24.5 % — ABNORMAL HIGH (ref 11.5–15.5)
WBC: 7.5 10*3/uL (ref 4.0–10.5)
nRBC: 0.7 % — ABNORMAL HIGH (ref 0.0–0.2)

## 2021-07-06 LAB — HEMOGLOBIN AND HEMATOCRIT, BLOOD
HCT: 29.9 % — ABNORMAL LOW (ref 39.0–52.0)
Hemoglobin: 8.1 g/dL — ABNORMAL LOW (ref 13.0–17.0)

## 2021-07-06 LAB — TSH: TSH: 1.151 u[IU]/mL (ref 0.350–4.500)

## 2021-07-06 LAB — VITAMIN B12: Vitamin B-12: 347 pg/mL (ref 180–914)

## 2021-07-06 LAB — PREPARE RBC (CROSSMATCH)

## 2021-07-06 LAB — HIV ANTIBODY (ROUTINE TESTING W REFLEX): HIV Screen 4th Generation wRfx: NONREACTIVE

## 2021-07-06 SURGERY — ESOPHAGOGASTRODUODENOSCOPY (EGD) WITH PROPOFOL
Anesthesia: General

## 2021-07-06 MED ORDER — SODIUM CHLORIDE 0.9 % IV SOLN: INTRAVENOUS | Status: DC

## 2021-07-06 MED ORDER — PROPOFOL 500 MG/50ML IV EMUL
INTRAVENOUS | Status: DC | PRN
Start: 2021-07-06 — End: 2021-07-06
  Administered 2021-07-06: 125 ug/kg/min via INTRAVENOUS

## 2021-07-06 MED ORDER — PROPOFOL 10 MG/ML IV BOLUS
INTRAVENOUS | Status: AC
  Filled 2021-07-06: qty 20

## 2021-07-06 MED ORDER — PROPOFOL 10 MG/ML IV BOLUS
INTRAVENOUS | Status: DC | PRN
  Administered 2021-07-06: 40 mg via INTRAVENOUS
  Administered 2021-07-06: 70 mg via INTRAVENOUS

## 2021-07-06 MED ORDER — SODIUM CHLORIDE 0.9% IV SOLUTION: Freq: Once | INTRAVENOUS | Status: AC

## 2021-07-06 MED ORDER — GLYCOPYRROLATE 0.2 MG/ML IJ SOLN
INTRAMUSCULAR | Status: DC | PRN
Start: 2021-07-06 — End: 2021-07-06
  Administered 2021-07-06: .2 mg via INTRAVENOUS

## 2021-07-06 MED ORDER — LIDOCAINE HCL (CARDIAC) PF 100 MG/5ML IV SOSY
PREFILLED_SYRINGE | INTRAVENOUS | Status: DC | PRN
  Administered 2021-07-06: 50 mg via INTRAVENOUS

## 2021-07-06 MED ORDER — PEG 3350-KCL-NA BICARB-NACL 420 G PO SOLR
4000.0000 mL | Freq: Once | ORAL | Status: AC
  Administered 2021-07-06: 4000 mL via ORAL
  Filled 2021-07-06: qty 4000

## 2021-07-06 MED ORDER — SODIUM CHLORIDE 0.9 % IV SOLN
300.0000 mg | Freq: Once | INTRAVENOUS | Status: AC
  Administered 2021-07-06: 300 mg via INTRAVENOUS
  Filled 2021-07-06: qty 15

## 2021-07-06 NOTE — Hospital Course (Addendum)
36 year old male with past medical history of tobacco abuse and bipolar disorder plus PTSD from a group home was receiving work-up for fatigue and found to have low hemoglobin and sent to the emergency room on 3/21.  Lab work revealed hemoglobin of 4.0 with repeat of 4.5 and MCV of 63, with further work-up noting iron deficiency anemia.  Patient admitted to the hospitalist service and transfused 2 units of packed red blood cells.  Patient seen by GI with plans for EGD and colonoscopy on 3/22. ? ?Prior to colonoscopy, hemoglobin on morning of 3/22 at 6.9 and as per anesthesia request, additional unit of packed red blood cells transfused.  EGD unremarkable.  Gastroenterology unable to do colonoscopy due to poor prep with still moderate amount of stool present.  Colonoscopy done 3/23 and found to have partially obstructing mass in transverse colon.  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  Oncology and general surgery consulted.  General surgery plans to take patient for mass removal and partial colectomy today. ?

## 2021-07-06 NOTE — Op Note (Signed)
[facility] ?Gastroenterology ?Patient Name: Connor Wilkinson ?Procedure Date: 07/06/2021 11:38 AM ?MRN: [id number] ?Account #: [id number] ?Date of Birth: June 02, 1985 ?Admit Type: Outpatient ?Age: 36 ?Room: [facility] ENDO ROOM 4 ?Gender: Male ?Note Status: Finalized ?Instrument Name: Upper Endoscope [id number] ?Procedure:             Upper GI endoscopy ?Indications:           Unexplained iron deficiency anemia ?Providers:             Lin Landsman MD, MD ?Medicines:             General Anesthesia ?Complications:         No immediate complications. Estimated blood loss: None. ?Procedure:             Pre-Anesthesia Assessment: ?                       - Prior to the procedure, a History and Physical was  ?                       performed, and patient medications and allergies were  ?                       reviewed. The patient is competent. The risks and  ?                       benefits of the procedure and the sedation options and  ?                       risks were discussed with the patient. All questions  ?                       were answered and informed consent was obtained.  ?                       Patient identification and proposed procedure were  ?                       verified by the physician, the nurse, the  ?                       anesthesiologist, the anesthetist and the technician  ?                       in the pre-procedure area in the procedure room in the  ?                       endoscopy suite. Mental Status Examination: alert and  ?                       oriented. Airway Examination: normal oropharyngeal  ?                       airway and neck mobility. Respiratory Examination:  ?                       clear to auscultation. CV Examination: normal.  ?                       Prophylactic  Antibiotics: The patient does not require  ?                       prophylactic antibiotics. Prior Anticoagulants: The  ?                       patient has taken no previous anticoagulant or  ?                        antiplatelet agents. ASA Grade Assessment: III - A  ?                       patient with severe systemic disease. After reviewing  ?                       the risks and benefits, the patient was deemed in  ?                       satisfactory condition to undergo the procedure. The  ?                       anesthesia plan was to use general anesthesia.  ?                       Immediately prior to administration of medications,  ?                       the patient was re-assessed for adequacy to receive  ?                       sedatives. The heart rate, respiratory rate, oxygen  ?                       saturations, blood pressure, adequacy of pulmonary  ?                       ventilation, and response to care were monitored  ?                       throughout the procedure. The physical status of the  ?                       patient was re-assessed after the procedure. ?                       After obtaining informed consent, the endoscope was  ?                       passed under direct vision. Throughout the procedure,  ?                       the patient's blood pressure, pulse, and oxygen  ?                       saturations were monitored continuously. The Endoscope  ?                       was introduced through the mouth, and advanced to the  ?  second part of duodenum. The upper GI endoscopy was  ?                       accomplished without difficulty. The patient tolerated  ?                       the procedure well. ?Findings: ?     The duodenal bulb and second portion of the duodenum were normal. ?     The entire examined stomach was normal. Biopsies were taken with a cold  ?     forceps for histology. ?     The cardia and gastric fundus were normal on retroflexion. ?     Esophagogastric landmarks were identified: the gastroesophageal junction  ?     was found at 40 cm from the incisors. ?     The gastroesophageal junction and examined esophagus were  normal. ?Impression:            - Normal duodenal bulb and second portion of the  ?                       duodenum. ?                       - Normal stomach. Biopsied. ?                       - Esophagogastric landmarks identified. ?                       - Normal gastroesophageal junction and esophagus. ?Recommendation:        - Await pathology results. ?                       - Proceed with colonoscopy as scheduled ?                       See colonoscopy report ?Procedure Code(s):     --- Professional --- ?                       [phone], Esophagogastroduodenoscopy, flexible,  ?                       transoral; with biopsy, single or multiple ?Diagnosis Code(s):     --- Professional --- ?                       D50.9, Iron deficiency anemia, unspecified ?CPT copyright 2019 American Medical Association. All rights reserved. ?The codes documented in this report are preliminary and upon coder review may  ?be revised to meet current compliance requirements. ?Dr. Ulyess Mort ?Jaiceon Collister Raeanne Gathers MD, MD ?07/06/2021 12:04:50 PM ?This report has been signed electronically. ?Number of Addenda: 0 ?Note Initiated On: 07/06/2021 11:38 AM ?Estimated Blood Loss:  Estimated blood loss: none. ?     [facility] ?

## 2021-07-06 NOTE — Progress Notes (Signed)
?   07/06/21 2025  ?Clinical Encounter Type  ?Visited With Patient  ?Visit Type Initial;Spiritual support;Social support  ?Referral From Patient  ?Consult/Referral To Chaplain  ?Spiritual Encounters  ?Spiritual Needs Prayer;Sacred text  ? ?Chaplain Burris responded to a request for sacred text. Chaplain Burris provided compassionate presence as well as active and reflective listening. Pt shared aspects of his life story and how he finds meaning through his faith for coping and healing. Chaplain B affirmed this use of faith to make meaning and to find comfort during current life transitions and to cope with past trauma. ? ?If there are resources for trauma-informed counseling in addition to ongoing support for mental health treatment needs this would be beneficial. ?

## 2021-07-06 NOTE — Anesthesia Postprocedure Evaluation (Signed)
Anesthesia Post Note ? ?Patient: Martinique Coone ? ?Procedure(s) Performed: ESOPHAGOGASTRODUODENOSCOPY (EGD) WITH PROPOFOL ?COLONOSCOPY WITH PROPOFOL ?GIVENS CAPSULE STUDY ? ?Patient location during evaluation: PACU ?Anesthesia Type: General ?Level of consciousness: awake and alert, oriented and patient cooperative ?Pain management: pain level controlled ?Vital Signs Assessment: post-procedure vital signs reviewed and stable ?Respiratory status: spontaneous breathing, nonlabored ventilation and respiratory function stable ?Cardiovascular status: blood pressure returned to baseline and stable ?Postop Assessment: adequate PO intake ?Anesthetic complications: no ? ? ?No notable events documented. ? ? ?Last Vitals:  ?Vitals:  ? 07/06/21 1100 07/06/21 1213  ?BP: 117/75 114/73  ?Pulse: 69   ?Resp: 17   ?Temp: 36.5 ?C (!) 36.4 ?C  ?SpO2: 100%   ?  ?Last Pain:  ?Vitals:  ? 07/06/21 1243  ?TempSrc:   ?PainSc: 0-No pain  ? ? ?  ?  ?  ?  ?  ?  ? ?Darrin Nipper ? ? ? ? ?

## 2021-07-06 NOTE — Op Note (Signed)
[facility] ?Gastroenterology ?Patient Name: Connor Wilkinson ?Procedure Date: 07/06/2021 11:41 AM ?MRN: [id number] ?Account #: [id number] ?Date of Birth: 1986-01-09 ?Admit Type: Outpatient ?Age: 36 ?Room: [facility] ENDO ROOM 4 ?Gender: Male ?Note Status: Finalized ?Instrument Name: Colonoscope [id number] ?Procedure:             Colonoscopy ?Indications:           This is the patient's first colonoscopy, Unexplained  ?                       iron deficiency anemia ?Providers:             Lin Landsman MD, MD ?Medicines:             General Anesthesia ?Complications:         No immediate complications. Estimated blood loss: None. ?Procedure:             Pre-Anesthesia Assessment: ?                       - Prior to the procedure, a History and Physical was  ?                       performed, and patient medications and allergies were  ?                       reviewed. The patient is competent. The risks and  ?                       benefits of the procedure and the sedation options and  ?                       risks were discussed with the patient. All questions  ?                       were answered and informed consent was obtained.  ?                       Patient identification and proposed procedure were  ?                       verified by the physician, the nurse, the  ?                       anesthesiologist, the anesthetist and the technician  ?                       in the pre-procedure area in the procedure room in the  ?                       endoscopy suite. Mental Status Examination: alert and  ?                       oriented. Airway Examination: normal oropharyngeal  ?                       airway and neck mobility. Respiratory Examination:  ?                       clear to auscultation. CV  Examination: normal.  ?                       Prophylactic Antibiotics: The patient does not require  ?                       prophylactic antibiotics. Prior Anticoagulants: The  ?                        patient has taken no previous anticoagulant or  ?                       antiplatelet agents. ASA Grade Assessment: III - A  ?                       patient with severe systemic disease. After reviewing  ?                       the risks and benefits, the patient was deemed in  ?                       satisfactory condition to undergo the procedure. The  ?                       anesthesia plan was to use general anesthesia.  ?                       Immediately prior to administration of medications,  ?                       the patient was re-assessed for adequacy to receive  ?                       sedatives. The heart rate, respiratory rate, oxygen  ?                       saturations, blood pressure, adequacy of pulmonary  ?                       ventilation, and response to care were monitored  ?                       throughout the procedure. The physical status of the  ?                       patient was re-assessed after the procedure. ?                       After obtaining informed consent, the colonoscope was  ?                       passed under direct vision. Throughout the procedure,  ?                       the patient's blood pressure, pulse, and oxygen  ?                       saturations were monitored continuously. The  ?  Colonoscope was introduced through the anus with the  ?                       intention of advancing to the cecum. The scope was  ?                       advanced to the sigmoid colon before the procedure was  ?                       aborted. Medications were given. The colonoscopy was  ?                       extremely difficult due to inadequate bowel prep.  ?                       Successful completion of the procedure was aided by  ?                       withdrawal of scope, procedure aborted. The patient  ?                       tolerated the procedure well. The quality of the bowel  ?                       preparation was poor. ?Findings: ?     The  perianal and digital rectal examinations were normal. Pertinent  ?     negatives include normal sphincter tone and no palpable rectal lesions. ?     Copious quantities of solid stool was found in the rectum, in the  ?     recto-sigmoid colon and in the sigmoid colon, precluding visualization. ?Impression:            - Preparation of the colon was poor. ?                       - Stool in the rectum, in the recto-sigmoid colon and  ?                       in the sigmoid colon. ?                       - No specimens collected. ?Recommendation:        - Return patient to hospital ward for ongoing care. ?                       - Clear liquid diet today. ?                       - Continue present medications. ?                       - Repeat colonoscopy tomorrow because the bowel  ?                       preparation was suboptimal. ?                       - Clear liquid diet today. ?Procedure Code(s):     --- Professional --- ?  91478, 53, Colonoscopy, flexible; diagnostic,  ?                       including collection of specimen(s) by brushing or  ?                       washing, when performed (separate procedure) ?Diagnosis Code(s):     --- Professional --- ?                       D50.9, Iron deficiency anemia, unspecified ?CPT copyright 2019 American Medical Association. All rights reserved. ?The codes documented in this report are preliminary and upon coder review may  ?be revised to meet current compliance requirements. ?Dr. Ulyess Mort ?Sukhman Kocher Raeanne Gathers MD, MD ?07/06/2021 12:11:33 PM ?This report has been signed electronically. ?Number of Addenda: 0 ?Note Initiated On: 07/06/2021 11:41 AM ?Total Procedure Duration: 0 hours 2 minutes 16 seconds  ?Estimated Blood Loss:  Estimated blood loss: none. ?     [facility] ?

## 2021-07-06 NOTE — Anesthesia Preprocedure Evaluation (Signed)
Anesthesia Evaluation  ?Patient identified by MRN, date of birth, ID band ?Patient awake ? ? ? ?Reviewed: ?Allergy & Precautions, NPO status , Patient's Chart, lab work & pertinent test results ? ?History of Anesthesia Complications ?Negative for: history of anesthetic complications ? ?Airway ?Mallampati: IV ? ? ?Neck ROM: Full ? ? ? Dental ?no notable dental hx. ? ?  ?Pulmonary ?Current Smoker (3-4 cigarettes per day) and Patient abstained from smoking.,  ?  ?Pulmonary exam normal ?breath sounds clear to auscultation ? ? ? ? ? ? Cardiovascular ?Exercise Tolerance: Good ?negative cardio ROS ?Normal cardiovascular exam ?Rhythm:Regular Rate:Normal ? ? ?  ?Neuro/Psych ?Seizures - (none in several years),  PSYCHIATRIC DISORDERS (PTSD) Bipolar Disorder   ? GI/Hepatic ?negative GI ROS,   ?Endo/Other  ?negative endocrine ROS ? Renal/GU ?negative Renal ROS  ? ?  ?Musculoskeletal ? ? Abdominal ?  ?Peds ? Hematology ? ?(+) Blood dyscrasia, anemia ,   ?Anesthesia Other Findings ? ? Reproductive/Obstetrics ? ?  ? ? ? ? ? ? ? ? ? ? ? ? ? ?  ?  ? ? ? ? ? ? ? ? ?Anesthesia Physical ?Anesthesia Plan ? ?ASA: 3 ? ?Anesthesia Plan: General  ? ?Post-op Pain Management:   ? ?Induction: Intravenous ? ?PONV Risk Score and Plan: 1 and Propofol infusion, TIVA and Treatment may vary due to age or medical condition ? ?Airway Management Planned: Natural Airway ? ?Additional Equipment:  ? ?Intra-op Plan:  ? ?Post-operative Plan:  ? ?Informed Consent: I have reviewed the patients History and Physical, chart, labs and discussed the procedure including the risks, benefits and alternatives for the proposed anesthesia with the patient or authorized representative who has indicated his/her understanding and acceptance.  ? ? ? ? ? ?Plan Discussed with: CRNA ? ?Anesthesia Plan Comments: (LMA/GETA backup discussed.  Patient consented for risks of anesthesia including but not limited to:  ?- adverse reactions to  medications ?- damage to eyes, teeth, lips or other oral mucosa ?- nerve damage due to positioning  ?- sore throat or hoarseness ?- damage to heart, brain, nerves, lungs, other parts of body or loss of life ? ?Informed patient about role of CRNA in peri- and intra-operative care.  Patient voiced understanding.)  ? ? ? ? ? ? ?Anesthesia Quick Evaluation ? ?

## 2021-07-06 NOTE — TOC Initial Note (Signed)
Transition of Care (TOC) - Initial/Assessment Note  ? ? ?Patient Details  ?Name: Martinique Steyer ?MRN: [id number] ?Date of Birth: 1986-02-25 ? ?Transition of Care (TOC) CM/SW Contact:    ?Pete Pelt, RN ?Phone Number: ?07/06/2021, 4:20 PM ? ?Clinical Narrative:   Patient is a resident of [facility].  RNCM spoke to [employer] facilitator  [phone] [facility])  who confirmed that plan is for patient to return to group home.  He will provide transportation for patient to return.   ? ?Mr. White states he will contact patient's physician for follow up appointment.  No concerns for TOC at this time, TOC contact information provided to facilitator.          ? ? ?Expected Discharge Plan: Group Home ([facility]) ?Barriers to Discharge: Continued Medical Work up ? ? ?Patient Goals and CMS Choice ?  ?  ?Choice offered to / list presented to : NA ? ?Expected Discharge Plan and Services ?Expected Discharge Plan: Group Home ([place]) ?  ?Discharge Planning Services: CM Consult ?  ?Living arrangements for the past 2 months: Group Home ?                ?  ?  ?  ?  ?  ?  ?  ?  ?  ?  ? ?Prior Living Arrangements/Services ?Living arrangements for the past 2 months: Group Home ?Lives with:: Facility Resident ?Patient language and need for interpreter reviewed:: Yes (Spoke to group home facilitator) ?Do you feel safe going back to the place where you live?: Yes      ?Need for Family Participation in Patient Care: Yes (Comment) ?Care giver support system in place?: Yes (comment) ?  ?Criminal Activity/Legal Involvement Pertinent to Current Situation/Hospitalization: No - Comment as needed ? ?Activities of Daily Living ?Home Assistive Devices/Equipment: Eyeglasses ?ADL Screening (condition at time of admission) ?Patient's cognitive ability adequate to safely complete daily activities?: Yes ?Is the patient deaf or have difficulty hearing?: No ?Does the patient have difficulty seeing, even when wearing  glasses/contacts?: No ?Does the patient have difficulty concentrating, remembering, or making decisions?: No ?Patient able to express need for assistance with ADLs?: Yes ?Does the patient have difficulty dressing or bathing?: No ?Independently performs ADLs?: Yes (appropriate for developmental age) ?Does the patient have difficulty walking or climbing stairs?: No ?Weakness of Legs: None ?Weakness of Arms/Hands: None ? ?Permission Sought/Granted ?Permission sought to share information with : Case Manager ?  ?   ? Permission granted to share info w AGENCY: [facility] ?   ?   ? ?Emotional Assessment ?Appearance:: Appears stated age ?  ?  ?Orientation: : Oriented to Self, Oriented to Place, Oriented to  Time, Oriented to Situation ?Alcohol / Substance Use: Not Applicable ?Psych Involvement: No (comment) ? ?Admission diagnosis:  Iron deficiency [E61.1] ?Symptomatic anemia [D64.9] ?Iron deficiency anemia [D50.9] ?Patient Active Problem List  ? Diagnosis Date Noted  ? Iron deficiency anemia 07/05/2021  ? Bipolar 1 disorder ([place])   ? Nicotine dependence   ? Orthostasis 10/23/2017  ? Weight loss 10/23/2017  ? Nightmares 10/23/2017  ? Seizures ([place]) 10/23/2017  ? Depression 10/23/2017  ? Syncope 10/19/2017  ? ?PCP:  [name], MD ?Pharmacy:   ?[id number] [place]?Phone: [phone]: [phone]? ?[place]?Phone: [phone]  Fax: [phone] ? ?[place] [address] ?Phone: [phone] Fax: [phone] ? ? ? ? ?Social Determinants of Health (SDOH) Interventions ?  ? ?Readmission Risk Interventions ? ?  07/06/2021  ?  4:18 PM  ?Readmission Risk Prevention Plan  ?Post Dischage Appt Complete  ?Medication Screening Complete  ?Transportation Screening  Complete  ? ? ? ?

## 2021-07-06 NOTE — Care Management (Signed)
white, byron (Other)  ?[phone] (Mobile) ? ?Patient is from [place].  Dorris Singh facilitator.  Patient has plans to return to group home following hospital admission. ?

## 2021-07-06 NOTE — Transfer of Care (Signed)
Immediate Anesthesia Transfer of Care Note ? ?Patient: Connor Wilkinson ? ?Procedure(s) Performed: ESOPHAGOGASTRODUODENOSCOPY (EGD) WITH PROPOFOL ?COLONOSCOPY WITH PROPOFOL ?GIVENS CAPSULE STUDY ? ?Patient Location: PACU and Endoscopy Unit ? ?Anesthesia Type:General ? ?Level of Consciousness: drowsy ? ?Airway & Oxygen Therapy: Patient Spontanous Breathing ? ?Post-op Assessment: Report given to RN ? ?Post vital signs: Reviewed and stable ? ?Last Vitals:  ?Vitals Value Taken Time  ?BP 114/73 07/06/21 1213  ?Temp    ?Pulse 64 07/06/21 1214  ?Resp 14 07/06/21 1214  ?SpO2 100 % 07/06/21 1214  ?Vitals shown include unvalidated device data. ? ?Last Pain:  ?Vitals:  ? 07/06/21 1100  ?TempSrc: Temporal  ?PainSc: 0-No pain  ?   ? ?  ? ?Complications: No notable events documented. ?

## 2021-07-06 NOTE — Progress Notes (Signed)
[facility] Progress Note ? ?Patient: Connor Wilkinson    BJS:[id number]  DOA: 07/05/2021    ?Date of Service: the patient was seen and examined on 07/06/2021 ? ?Brief hospital course: ?36 year old male with past medical history of tobacco abuse and bipolar disorder plus PTSD from a group home was receiving work-up for fatigue and found to have low hemoglobin and sent to the emergency room on 3/21.  Lab work revealed hemoglobin of 4.0 with repeat of 4.5 and MCV of 63, with further work-up noting iron deficiency anemia.  Patient admitted to the hospitalist service and transfused 2 units of packed red blood cells.  Patient seen by GI with plans for EGD and colonoscopy on 3/22. ? ?Prior to colonoscopy, hemoglobin on morning of 3/22 at 6.9 and as per anesthesia request, additional unit of packed red blood cells transfused.  EGD unremarkable.  Gastroenterology unable to do colonoscopy due to poor prep with still moderate amount of stool present.  Colonoscopy rescheduled for 3/23. ? ?Assessment and Plan: ?Assessment and Plan: ?* Iron deficiency anemia ?No obvious source.  Status post transfusion now 2 units packed red blood cells with increased to 6.9 and 1 additional unit to be transfused.  TSH within normal limits.  EGD unremarkable.  Colonoscopy rescheduled for 3/23 due to poor bowel prep and presence of stool in colon.  If no discernible GI etiology, hematology to be consulted.  Blood pressure overall stable. ? ?Nicotine dependence ?Smoking cessation has been discussed with patient in detail ?We will start him on nicotine gum 2 mg as needed ? ?Bipolar 1 disorder ([place]) ?Stable ?Continue Abilify, Cogentin, fluoxetine and buspirone ? ? ? ? ? ? ?Body mass index is 23.73 kg/m?.  ?  ?   ? ?Consultants: ?Gastroenterology ? ?Procedures: ?Endoscopy 3/22: Unremarkable ? ?Antimicrobials: ?Preop Ancef ? ?Code Status: Full code ? ? ?Subjective: Resting comfortably ? ?Objective: ?Vital signs were reviewed and  unremarkable. ?Vitals:  ? 07/06/21 1100 07/06/21 1213  ?BP: 117/75 114/73  ?Pulse: 69   ?Resp: 17   ?Temp: 97.7 ?F (36.5 ?C) (!) 97.5 ?F (36.4 ?C)  ?SpO2: 100%   ? ? ?Intake/Output Summary (Last 24 hours) at 07/06/2021 1452 ?Last data filed at 07/06/2021 1210 ?Gross per 24 hour  ?Intake 2287.16 ml  ?Output 1100 ml  ?Net 1187.16 ml  ? ?Filed Weights  ? 07/05/21 1006 07/06/21 1100  ?Weight: 79.4 kg 79.4 kg  ? ?Body mass index is 23.73 kg/m?. ? ?Exam: ? ?General: Resting comfortably ?HEENT: Normocephalic, atraumatic, mucous membranes are moist ?Cardiovascular: Regular rate and rhythm, S1-S2 ?Respiratory: Clear to auscultation bilaterally ?Abdomen: Soft, nontender,, positive bowel sounds ?Musculoskeletal: No clubbing cyanosis or edema ?Skin: No skin breaks, tears or lesions ?Psychiatry: Appropriate, no evidence of psychoses ?Neurology: No focal deficits ? ?Data Reviewed: ?Hemoglobin of 6.9 this morning, up from 4.518 hours prior of which she had received 2 units packed red blood cells.  Additional unit of blood transfused. ? ?Normal TSH at 1.151 ? ?Disposition:  ?Status is: Inpatient ?Patient is inpatient status and needs to continue as he is receiving acute care and services  ?  ? ?Anticipated discharge date: 3/24 ? ?Remaining issues to be resolved so that patient can be discharged: Colonoscopy 3/23.  Determination of patient's anemia ? ? ?Family Communication: Left message for contact listed ?DVT Prophylaxis: ?SCDs Start: 07/05/21 1313 ? ? ? ?Author: ?Annita Brod ,MD ?07/06/2021 2:52 PM ? ?To reach On-call, see care teams to locate the attending and reach out via [email or web address]. ?Between  7PM-7AM, please contact night-coverage ?If you still have difficulty reaching the attending provider, please page the [facility] (Director on Call) for [facility] on amion for assistance. ? ?

## 2021-07-07 ENCOUNTER — Inpatient Hospital Stay: Payer: Medicaid Other

## 2021-07-07 ENCOUNTER — Inpatient Hospital Stay: Payer: Medicaid Other | Admitting: Anesthesiology

## 2021-07-07 ENCOUNTER — Other Ambulatory Visit: Payer: Self-pay

## 2021-07-07 ENCOUNTER — Encounter
Admission: EM | Disposition: A | Discharge status: Nursing Home (Includes ICF) | Payer: Self-pay | Source: Home / Self Care | Attending: Internal Medicine

## 2021-07-07 DIAGNOSIS — F319 Bipolar disorder, unspecified: Secondary | ICD-10-CM | POA: Diagnosis not present

## 2021-07-07 DIAGNOSIS — K6389 Other specified diseases of intestine: Secondary | ICD-10-CM

## 2021-07-07 DIAGNOSIS — C189 Malignant neoplasm of colon, unspecified: Secondary | ICD-10-CM | POA: Diagnosis present

## 2021-07-07 DIAGNOSIS — D500 Iron deficiency anemia secondary to blood loss (chronic): Secondary | ICD-10-CM | POA: Diagnosis not present

## 2021-07-07 DIAGNOSIS — D649 Anemia, unspecified: Secondary | ICD-10-CM | POA: Diagnosis not present

## 2021-07-07 HISTORY — PX: COLONOSCOPY WITH PROPOFOL: SHX5780

## 2021-07-07 LAB — CBC
HCT: 27.4 % — ABNORMAL LOW (ref 39.0–52.0)
Hemoglobin: 7.4 g/dL — ABNORMAL LOW (ref 13.0–17.0)
MCH: 18.9 pg — ABNORMAL LOW (ref 26.0–34.0)
MCHC: 27 g/dL — ABNORMAL LOW (ref 30.0–36.0)
MCV: 70.1 fL — ABNORMAL LOW (ref 80.0–100.0)
Platelets: 524 10*3/uL — ABNORMAL HIGH (ref 150–400)
RBC: 3.91 MIL/uL — ABNORMAL LOW (ref 4.22–5.81)
RDW: 25.4 % — ABNORMAL HIGH (ref 11.5–15.5)
WBC: 7.4 10*3/uL (ref 4.0–10.5)
nRBC: 0.8 % — ABNORMAL HIGH (ref 0.0–0.2)

## 2021-07-07 LAB — HAPTOGLOBIN: Haptoglobin: 243 mg/dL (ref 17–317)

## 2021-07-07 LAB — BASIC METABOLIC PANEL
Anion gap: 5 (ref 5–15)
BUN: 9 mg/dL (ref 6–20)
CO2: 26 mmol/L (ref 22–32)
Calcium: 9 mg/dL (ref 8.9–10.3)
Chloride: 110 mmol/L (ref 98–111)
Creatinine, Ser: 0.95 mg/dL (ref 0.61–1.24)
GFR, Estimated: >60 mL/min (ref >60)
Glucose, Bld: 89 mg/dL (ref 70–99)
Potassium: 4.3 mmol/L (ref 3.5–5.1)
Sodium: 141 mmol/L (ref 135–145)

## 2021-07-07 LAB — BPAM RBC
Blood Product Expiration Date: 202304202359
Blood Product Expiration Date: 202304222359
Blood Product Expiration Date: 202304242359
ISSUE DATE / TIME: 202303211430
ISSUE DATE / TIME: 202303211830
ISSUE DATE / TIME: 202303220923
Unit Type and Rh: 5100
Unit Type and Rh: 5100
Unit Type and Rh: 5100

## 2021-07-07 LAB — TYPE AND SCREEN
ABO/RH(D): O POS
Antibody Screen: NEGATIVE
Unit division: 0
Unit division: 0
Unit division: 0

## 2021-07-07 LAB — COPPER, SERUM: Copper: 161 ug/dL — ABNORMAL HIGH (ref 69–132)

## 2021-07-07 LAB — SURGICAL PATHOLOGY

## 2021-07-07 SURGERY — COLONOSCOPY WITH PROPOFOL
Anesthesia: General

## 2021-07-07 MED ORDER — PROPOFOL 10 MG/ML IV BOLUS
INTRAVENOUS | Status: DC | PRN
  Administered 2021-07-07: 80 mg via INTRAVENOUS

## 2021-07-07 MED ORDER — ENSURE ENLIVE PO LIQD
237.0000 mL | Freq: Two times a day (BID) | ORAL | Status: DC
  Administered 2021-07-07 – 2021-07-10 (×7): 237 mL via ORAL

## 2021-07-07 MED ORDER — PROPOFOL 500 MG/50ML IV EMUL
INTRAVENOUS | Status: DC | PRN
  Administered 2021-07-07: 150 ug/kg/min via INTRAVENOUS

## 2021-07-07 MED ORDER — IOHEXOL 9 MG/ML PO SOLN
500.0000 mL | ORAL | Status: AC
  Administered 2021-07-07 (×2): 500 mL via ORAL

## 2021-07-07 MED ORDER — LIDOCAINE HCL (CARDIAC) PF 100 MG/5ML IV SOSY
PREFILLED_SYRINGE | INTRAVENOUS | Status: DC | PRN
  Administered 2021-07-07: 50 mg via INTRAVENOUS

## 2021-07-07 MED ORDER — IRON SUCROSE 20 MG/ML IV SOLN
300.0000 mg | Freq: Once | INTRAVENOUS | Status: AC
  Administered 2021-07-07: 300 mg via INTRAVENOUS
  Filled 2021-07-07: qty 300

## 2021-07-07 MED ORDER — SODIUM CHLORIDE 0.9 % IV SOLN: INTRAVENOUS | Status: DC

## 2021-07-07 MED ORDER — IOHEXOL 300 MG/ML  SOLN
100.0000 mL | Freq: Once | INTRAMUSCULAR | Status: AC | PRN
  Administered 2021-07-07: 100 mL via INTRAVENOUS

## 2021-07-07 NOTE — Progress Notes (Signed)
[facility] Progress Note ? ?Patient: Connor Wilkinson    HCW:[id number]  DOA: 07/05/2021    ?Date of Service: the patient was seen and examined on 07/07/2021 ? ?Brief hospital course: ?36 year old male with past medical history of tobacco abuse and bipolar disorder plus PTSD from a group home was receiving work-up for fatigue and found to have low hemoglobin and sent to the emergency room on 3/21.  Lab work revealed hemoglobin of 4.0 with repeat of 4.5 and MCV of 63, with further work-up noting iron deficiency anemia.  Patient admitted to the hospitalist service and transfused 2 units of packed red blood cells.  Patient seen by GI with plans for EGD and colonoscopy on 3/22. ? ?Prior to colonoscopy, hemoglobin on morning of 3/22 at 6.9 and as per anesthesia request, additional unit of packed red blood cells transfused.  EGD unremarkable.  Gastroenterology unable to do colonoscopy due to poor prep with still moderate amount of stool present.  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  Oncology and general surgery consulted. ? ?Assessment and Plan: ?Assessment and Plan: ?* Iron deficiency anemia ?Status post 3 units packed red blood cells.  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 ? ?Colonic mass ?This is the cause of patient's iron deficiency anemia.  May have been going on for some time now.  Appreciate GI help.  Have consulted general surgery and medical oncology.  Checking CEA level. ? ?Nicotine dependence ?Smoking cessation has been discussed with patient in detail ?We will start him on nicotine gum 2 mg as needed ? ?Bipolar 1 disorder ([place]) ?Stable ?Continue Abilify, Cogentin, fluoxetine and buspirone ? ? ? ? ? ? ?Body mass index is 23.73 kg/m?.  ?  ?   ? ?Consultants: ?Gastroenterology ?General surgery ?Medical oncology ? ?Procedures: ?Endoscopy 3/22: Unremarkable ?Colonoscopy 3/23: Near occluding mass in the transverse colon ?Transfusion 3 units packed red  blood cells ? ?Antimicrobials: ?Preop Ancef ? ?Code Status: Full code ? ? ?Subjective: Seen prior to scope.  No complaints, hungry ? ?Objective: ?Vital signs were reviewed and unremarkable. ?Vitals:  ? 07/07/21 1242 07/07/21 1252  ?BP: 115/69 101/68  ?Pulse: 67 71  ?Resp: 14 12  ?Temp:    ?SpO2: 100% 100%  ? ? ?Intake/Output Summary (Last 24 hours) at 07/07/2021 1314 ?Last data filed at 07/07/2021 1224 ?Gross per 24 hour  ?Intake 403 ml  ?Output 925 ml  ?Net -522 ml  ? ? ?Filed Weights  ? 07/05/21 1006 07/06/21 1100  ?Weight: 79.4 kg 79.4 kg  ? ?Body mass index is 23.73 kg/m?. ? ?Exam: ? ?General: Alert and oriented x3, no acute distress ?HEENT: Normocephalic, atraumatic, mucous membranes are moist ?Cardiovascular: Regular rate and rhythm, S1-S2 ?Respiratory: Clear to auscultation bilaterally ?Abdomen: Soft, nontender,, positive bowel sounds ?Musculoskeletal: No clubbing cyanosis or edema ?Skin: No skin breaks, tears or lesions ?Psychiatry: Appropriate, no evidence of psychoses ?Neurology: No focal deficits ? ?Data Reviewed: ?Post third unit of blood transfused, hemoglobin at 8.1.  This morning down to 7.4.  Platelets at 524. ? ?Normal TSH at 1.151 ? ?Disposition:  ?Status is: Inpatient ?Patient is inpatient status and needs to continue as he is receiving acute care and services  ?  ? ?Anticipated discharge date: 3/28 ? ?Remaining issues to be resolved so that patient can be discharged: Evaluation by oncology.  Likely will need surgery. ? ? ?Family Communication: Left message for contact listed ?DVT Prophylaxis: ?SCDs Start: 07/05/21 1313 ? ? ? ?Author: ?Trenton Gammon  [place] Pink ,MD ?07/07/2021 1:14 PM ? ?To reach On-call, see care teams to locate the attending and reach out via [email or web address]. ?Between 7PM-7AM, please contact night-coverage ?If you still have difficulty reaching the attending provider, please page the [facility] (Director on Call) for [facility] on amion for assistance. ? ?

## 2021-07-07 NOTE — Consult Note (Signed)
Patient ID: Connor Wilkinson, male   DOB: 01-Dec-1985, 36 y.o.   MRN: [id number] ? ?HPI ?Connor Peckham is a 36 y.o. male seen in consultation at the request of Dr. [place] Pink for recently diagnosed near obstructing transverse colon mass.  He does have a history of bipolar disorder and PTSD and currently lives in a group home.  He gets routine labs given the mental health medication he needs to take.  When he was doing routine laboratory data it showed that his hemoglobin dropped down to 4.  At the same time he did have dyspnea on exertion.  This prompted a referral to the emergency room and hospitalization.  He underwent a colonoscopy today by Dr. Marius Ditch.  Please note I have personally reviewed the images showing a near obstructing lesion 80 cm from the anal verge.  He also had a CT scan of the chest and abdomen and pelvis that I have personally reviewed showing evidence of a large mass in the proximal transverse colon consistent with primary colon cancer.  There is a few nodes, but no evidence of distant metastatic disease. ?Currently the patient just had supper.  He is passing gas and having bowel movement.  He does not have any abdominal pain.  Labs today show a hemoglobin of 7.4 this is after 3 units of blood.  White count and platelets are fine.  BMP is completely normal. ?He is able to perform more than 4 METS of activity on a baseline without shortness of breath or chest pain.  Please note that recently because of the severe anemia he did have dyspnea on exertion. ?Final pathology is pending at this time.  He is competent, w/o any positive symptoms. ?NEgative fam hx of CRC. HE does smoke daily. ? ?HPI ? ?Past Medical History:  ?Diagnosis Date  ? Bipolar 1 disorder ([place])   ? PTSD (post-traumatic stress disorder)   ? ? ?History reviewed. No pertinent surgical history. ? ?History reviewed. No pertinent family history. ? ?Social History ?Social History  ? ?Tobacco Use  ? Smoking status: Every Day  ? Smokeless  tobacco: Never  ?Vaping Use  ? Vaping Use: Every day  ?Substance Use Topics  ? Alcohol use: Not Currently  ? Drug use: Not Currently  ? ? ?Allergies  ?Allergen Reactions  ? Geodon [Ziprasidone Hcl] Anaphylaxis  ? ? ?Current Facility-Administered Medications  ?Medication Dose Route Frequency Provider Last Rate Last Admin  ? 0.9 %  sodium chloride infusion  10 mL/hr Intravenous Once [name], MD      ? 0.9 %  sodium chloride infusion  250 mL Intravenous PRN Agbata, Tochukwu, MD      ? 0.9 %  sodium chloride infusion   Intravenous Continuous Marius Ditch, Tally Due, MD 10 mL/hr at 07/06/21 1154 Continued from Pre-op at 07/06/21 1154  ? 0.9 %  sodium chloride infusion   Intravenous Continuous Vanga, Tally Due, MD   Stopped at 07/06/21 1311  ? acetaminophen (TYLENOL) tablet 650 mg  650 mg Oral Q6H PRN Agbata, Tochukwu, MD      ? Or  ? acetaminophen (TYLENOL) suppository 650 mg  650 mg Rectal Q6H PRN Agbata, Tochukwu, MD      ? ARIPiprazole (ABILIFY) tablet 10 mg  10 mg Oral Daily Agbata, Tochukwu, MD   10 mg at 07/07/21 0900  ? benztropine (COGENTIN) tablet 2 mg  2 mg Oral QHS Agbata, Tochukwu, MD   2 mg at 07/06/21 2144  ? busPIRone (BUSPAR) tablet 10 mg  10 mg  Oral BID Collier Bullock, MD   10 mg at 07/07/21 0859  ? feeding supplement (ENSURE ENLIVE / ENSURE PLUS) liquid 237 mL  237 mL Oral BID BM Lin Landsman, MD   237 mL at 07/07/21 1331  ? FLUoxetine (PROZAC) capsule 20 mg  20 mg Oral Daily Agbata, Tochukwu, MD   20 mg at 07/07/21 9798  ? nicotine polacrilex (NICORETTE) gum 2 mg  2 mg Oral PRN Agbata, Tochukwu, MD   2 mg at 07/07/21 0559  ? ondansetron (ZOFRAN) tablet 4 mg  4 mg Oral Q6H PRN Agbata, Tochukwu, MD      ? Or  ? ondansetron (ZOFRAN) injection 4 mg  4 mg Intravenous Q6H PRN Agbata, Tochukwu, MD      ? prazosin (MINIPRESS) capsule 2 mg  2 mg Oral BID Agbata, Tochukwu, MD   2 mg at 07/07/21 0859  ? sodium chloride flush (NS) 0.9 % injection 3 mL  3 mL Intravenous Q12H Agbata, Tochukwu, MD    3 mL at 07/07/21 0902  ? sodium chloride flush (NS) 0.9 % injection 3 mL  3 mL Intravenous PRN Agbata, Tochukwu, MD      ? ? ? ?Review of Systems ?Full ROS  was asked and was negative except for the information on the HPI ? ?Physical Exam ?Blood pressure (!) 138/98, pulse 91, temperature 98.3 ?F (36.8 ?C), temperature source Oral, resp. rate 16, height 6' (1.829 m), weight 79.4 kg, SpO2 100 %. ?CONSTITUTIONAL: NAD. ?EYES: Pupils are equal, round, , Sclera are non-icteric. ?EARS, NOSE, MOUTH AND THROAT:  He is wearing a mask. Hearing is intact to voice. ?LYMPH NODES:  Lymph nodes in the neck are normal. ?RESPIRATORY:  Lungs are clear. There is normal respiratory effort, with equal breath sounds bilaterally, and without pathologic use of accessory muscles. ?CARDIOVASCULAR: Heart is regular without murmurs, gallops, or rubs. ?GI: The abdomen is  soft, nontender, and nondistended. There are no palpable masses. There is no hepatosplenomegaly. There are normal bowel sounds in all quadrants. ?GU: Rectal deferred.   ?MUSCULOSKELETAL: Normal muscle strength and tone. No cyanosis or edema.   ?SKIN: Turgor is good and there are no pathologic skin lesions or ulcers. ?NEUROLOGIC: Motor and sensation is grossly normal. Cranial nerves are grossly intact. ?PSYCH:  Oriented to person, place and time. Affect is normal. ? ?Data Reviewed ? ?I have personally reviewed the patient's imaging, laboratory findings and medical records.   ? 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  Clinically no evidence of large bowel obstruction.  There is no evidence of significant metastatic disease other than presumed mesenteric nodal involvement. ?I do think that he will need surgical intervention.  I will like to for his hemoglobin to be in the 8.5 and he is not quite there yet.  This will allow some wiggle room in the OR.  I do understand that multiple blood transfusions is associated  with worse prognosis regarding colon cancer but at the same time I do think that is prudent to get him optimized from oxygen carrying capacity so he can tolerate the operation appropriately.  I had an extensive discussion with the patient regarding his disease process.  I do think that we can schedule him for right colectomy in the next few days.  He understands that he will need another colon preparation.  In the meantime I will like him to get his diet he already had supper and he is very hungry.  I have  also discussed with Dr. Janese Banks and she will continue iron as well as perioperative transfusion.  Currently there is no need for emergent surgical intervention.  He understands that in the event that he will obstruct he may require emergent surgery.  I do not think were quite there yet.  Given that he is in a group home I do think that is more practical to keep him here until we are able to perform his definitive surgery in 3 to 4 days.  Please note that I have spent greater than 75 minutes in this encounter including personally reviewing imaging studies, coordination of his care, placing orders, discussing care with multiple physicians and performing appropriate documentation ? ?Caroleen Hamman, MD FACS ?General Surgeon ?07/07/2021, 6:11 PM ? ?  ?

## 2021-07-07 NOTE — Anesthesia Preprocedure Evaluation (Signed)
Anesthesia Evaluation  ?Patient identified by MRN, date of birth, ID band ?Patient awake ? ?General Assessment Comment: ? ?Had EGD/colon yesterday but had inadequate prep ? ?Reviewed: ?Allergy & Precautions, NPO status , Patient's Chart, lab work & pertinent test results ? ?History of Anesthesia Complications ?Negative for: history of anesthetic complications ? ?Airway ?Mallampati: III ? ? ?Neck ROM: Full ? ? ? Dental ?no notable dental hx. ?(+) Teeth Intact ?  ?Pulmonary ?neg pulmonary ROS, neg sleep apnea, neg COPD, Current Smoker and Patient abstained from smoking.,  ?  ?Pulmonary exam normal ?breath sounds clear to auscultation ? ? ? ? ? ? Cardiovascular ?Exercise Tolerance: Good ?METS(-) hypertension(-) CAD and (-) Past MI negative cardio ROS ?Normal cardiovascular exam(-) dysrhythmias  ?Rhythm:Regular Rate:Normal ? ? ?  ?Neuro/Psych ?Seizures - (none in several years), Well Controlled,  PSYCHIATRIC DISORDERS (PTSD) Anxiety Depression Bipolar Disorder   ? GI/Hepatic ?negative GI ROS, neg GERD  ,(+)  ?  ? (-) substance abuse ? ,   ?Endo/Other  ?negative endocrine ROSneg diabetes ? Renal/GU ?negative Renal ROS  ? ?  ?Musculoskeletal ? ? Abdominal ?  ?Peds ? Hematology ? ?(+) Blood dyscrasia, anemia ,   ?Anesthesia Other Findings ?Past Medical History: ?No date: Bipolar 1 disorder ([place]) ?No date: PTSD (post-traumatic stress disorder) ? Reproductive/Obstetrics ? ?  ? ? ? ? ? ? ? ? ? ? ? ? ? ?  ?  ? ? ? ? ? ? ? ? ?Anesthesia Physical ? ?Anesthesia Plan ? ?ASA: 3 ? ?Anesthesia Plan: General  ? ?Post-op Pain Management: Minimal or no pain anticipated  ? ?Induction: Intravenous ? ?PONV Risk Score and Plan: 1 and Propofol infusion, TIVA and Treatment may vary due to age or medical condition ? ?Airway Management Planned: Natural Airway ? ?Additional Equipment: None ? ?Intra-op Plan:  ? ?Post-operative Plan:  ? ?Informed Consent: I have reviewed the patients History and Physical, chart,  labs and discussed the procedure including the risks, benefits and alternatives for the proposed anesthesia with the patient or authorized representative who has indicated his/her understanding and acceptance.  ? ? ? ?Dental advisory given ? ?Plan Discussed with: CRNA ? ?Anesthesia Plan Comments: (LMA/GETA backup discussed.  Patient consented for risks of anesthesia including but not limited to:  ?- adverse reactions to medications ?- damage to eyes, teeth, lips or other oral mucosa ?- nerve damage due to positioning  ?- sore throat or hoarseness ?- damage to heart, brain, nerves, lungs, other parts of body or loss of life ? ?Informed patient about role of CRNA in peri- and intra-operative care.  Patient voiced understanding.)  ? ? ? ? ? ? ?Anesthesia Quick Evaluation ? ?

## 2021-07-07 NOTE — Transfer of Care (Signed)
Immediate Anesthesia Transfer of Care Note ? ?Patient: Connor Wilkinson ? ?Procedure(s) Performed: COLONOSCOPY WITH PROPOFOL ? ?Patient Location: PACU ? ?Anesthesia Type:General ? ?Level of Consciousness: sedated ? ?Airway & Oxygen Therapy: Patient Spontanous Breathing ? ?Post-op Assessment: Report given to RN and Post -op Vital signs reviewed and stable ? ?Post vital signs: Reviewed and stable ? ?Last Vitals:  ?Vitals Value Taken Time  ?BP 105/60 07/07/21 1232  ?Temp    ?Pulse 70 07/07/21 1232  ?Resp 16 07/07/21 1232  ?SpO2 98 % 07/07/21 1232  ? ? ?Last Pain:  ?Vitals:  ? 07/07/21 0749  ?TempSrc: Oral  ?PainSc:   ?   ? ?  ? ?Complications: No notable events documented. ?

## 2021-07-07 NOTE — Assessment & Plan Note (Addendum)
This is the cause of patient's iron deficiency anemia.  May have been going on for some time now.  Appreciate GI help.  Confirmed with pathology.  CEA level pending.  Oncology following.  CT of chest abdomen pelvis notes no evidence of metastatic disease.  Possible some adjacent lymph node spread.  General surgery plans to take patient for mass removal and partial colectomy today. ?

## 2021-07-07 NOTE — Op Note (Signed)
[facility] ?Gastroenterology ?Patient Name: Connor Wilkinson ?Procedure Date: 07/07/2021 11:48 AM ?MRN: [id number] ?Account #: [id number] ?Date of Birth: [birth year] ?Admit Type: Inpatient ?Age: 36 ?Room: [facility] ENDO ROOM 2 ?Gender: Male ?Note Status: Finalized ?Instrument Name: Colonoscope [id number] ?Procedure:             Colonoscopy ?Indications:           Unexplained iron deficiency anemia ?Providers:             Connor Landsman MD, MD ?Referring MD:          Connor Carls, MD (Referring MD) ?Medicines:             General Anesthesia ?Complications:         No immediate complications. Estimated blood loss: None. ?Procedure:             Pre-Anesthesia Assessment: ?                       - Prior to the procedure, a History and Physical was  ?                       performed, and patient medications and allergies were  ?                       reviewed. The patient is competent. The risks and  ?                       benefits of the procedure and the sedation options and  ?                       risks were discussed with the patient. All questions  ?                       were answered and informed consent was obtained.  ?                       Patient identification and proposed procedure were  ?                       verified by the physician, the nurse, the  ?                       anesthesiologist, the anesthetist and the technician  ?                       in the pre-procedure area in the procedure room in the  ?                       endoscopy suite. Mental Status Examination: alert and  ?                       oriented. Airway Examination: normal oropharyngeal  ?                       airway and neck mobility. Respiratory Examination:  ?                       clear to auscultation. CV Examination: normal.  ?  Prophylactic Antibiotics: The patient does not require  ?                       prophylactic antibiotics. Prior Anticoagulants: The  ?                       patient  has taken no previous anticoagulant or  ?                       antiplatelet agents. ASA Grade Assessment: III - A  ?                       patient with severe systemic disease. After reviewing  ?                       the risks and benefits, the patient was deemed in  ?                       satisfactory condition to undergo the procedure. The  ?                       anesthesia plan was to use general anesthesia.  ?                       Immediately prior to administration of medications,  ?                       the patient was re-assessed for adequacy to receive  ?                       sedatives. The heart rate, respiratory rate, oxygen  ?                       saturations, blood pressure, adequacy of pulmonary  ?                       ventilation, and response to care were monitored  ?                       throughout the procedure. The physical status of the  ?                       patient was re-assessed after the procedure. ?                       After obtaining informed consent, the colonoscope was  ?                       passed under direct vision. Throughout the procedure,  ?                       the patient's blood pressure, pulse, and oxygen  ?                       saturations were monitored continuously. The  ?                       Colonoscope was introduced through the anus and  ?  advanced to the the transverse colon to examine a  ?                       mass. This was the intended extent. The colonoscopy  ?                       was extremely difficult due to a partially obstructing  ?                       mass. The patient tolerated the procedure well. The  ?                       quality of the bowel preparation was poor. ?Findings: ?     The perianal and digital rectal examinations were normal. Pertinent  ?     negatives include normal sphincter tone and no palpable rectal lesions. ?     A fungating, infiltrative and ulcerated partially obstructing large mass  ?      was found at 80 cm proximal to the anus. The mass was circumferential.  ?     No bleeding was present. Biopsies were taken with a cold forceps for  ?     histology. Estimated blood loss was minimal. Area was tattooed with an  ?     injection of Spot (carbon black). ?     Copious quantities of liquid solid stool was found in the entire colon. ?     The retroflexed view of the distal rectum and anal verge was normal and  ?     showed no anal or rectal abnormalities. ?Impression:            - Preparation of the colon was poor. ?                       - Malignant partially obstructing tumor at 80 cm  ?                       proximal to the anus. Biopsied. Tattooed. ?                       - Stool in the entire examined colon. ?                       - The distal rectum and anal verge are normal on  ?                       retroflexion view. ?Recommendation:        - Return patient to hospital ward for ongoing care. ?                       - Clear liquid diet today. ?                       - Refer to a Psychologist, sport and exercise. ?                       - Refer to an oncologist. ?                       - Await pathology results. ?Procedure Code(s):     --- Professional --- ?  91478, 52, Colonoscopy, flexible; with directed  ?                       submucosal injection(s), any substance ?                       45380, 52, Colonoscopy, flexible; with biopsy, single  ?                       or multiple ?Diagnosis Code(s):     --- Professional --- ?                       C18.9, Malignant neoplasm of colon, unspecified ?                       K56.690, Other partial intestinal obstruction ?                       D50.9, Iron deficiency anemia, unspecified ?CPT copyright 2019 American Medical Association. All rights reserved. ?The codes documented in this report are preliminary and upon coder review may  ?be revised to meet current compliance requirements. ?Dr. Ulyess Wilkinson ?Connor Asaro Raeanne Gathers MD, MD ?07/07/2021 12:32:21  PM ?This report has been signed electronically. ?Number of Addenda: 0 ?Note Initiated On: 07/07/2021 11:48 AM ?Scope Withdrawal Time: 0 hours 5 minutes 6 seconds  ?Total Procedure Duration: 0 hours 13 minutes 47 seconds  ?Estimated Blood Loss:  Estimated blood loss: none. ?     [facility] ?

## 2021-07-07 NOTE — Anesthesia Procedure Notes (Addendum)
Date/Time: 07/07/2021 12:04 PM ?Performed by: Johnna Acosta, CRNA ?Pre-anesthesia Checklist: Patient identified, Emergency Drugs available, Suction available, Patient being monitored and Timeout performed ?Patient Re-evaluated:Patient Re-evaluated prior to induction ?Oxygen Delivery Method: Nasal cannula ?Preoxygenation: Pre-oxygenation with 100% oxygen ?Induction Type: IV induction ? ? ? ? ?

## 2021-07-08 ENCOUNTER — Encounter: Payer: Self-pay | Admitting: Gastroenterology

## 2021-07-08 DIAGNOSIS — F17210 Nicotine dependence, cigarettes, uncomplicated: Secondary | ICD-10-CM | POA: Diagnosis not present

## 2021-07-08 DIAGNOSIS — C189 Malignant neoplasm of colon, unspecified: Secondary | ICD-10-CM

## 2021-07-08 DIAGNOSIS — D500 Iron deficiency anemia secondary to blood loss (chronic): Secondary | ICD-10-CM | POA: Diagnosis not present

## 2021-07-08 DIAGNOSIS — F319 Bipolar disorder, unspecified: Secondary | ICD-10-CM | POA: Diagnosis not present

## 2021-07-08 LAB — CBC WITH DIFFERENTIAL/PLATELET
Abs Immature Granulocytes: 0.08 10*3/uL — ABNORMAL HIGH (ref 0.00–0.07)
Basophils Absolute: 0 10*3/uL (ref 0.0–0.1)
Basophils Relative: 0 %
Eosinophils Absolute: 0.1 10*3/uL (ref 0.0–0.5)
Eosinophils Relative: 1 %
HCT: 29.4 % — ABNORMAL LOW (ref 39.0–52.0)
Hemoglobin: 8 g/dL — ABNORMAL LOW (ref 13.0–17.0)
Immature Granulocytes: 1 %
Lymphocytes Relative: 13 %
Lymphs Abs: 1.2 10*3/uL (ref 0.7–4.0)
MCH: 19.3 pg — ABNORMAL LOW (ref 26.0–34.0)
MCHC: 27.2 g/dL — ABNORMAL LOW (ref 30.0–36.0)
MCV: 70.8 fL — ABNORMAL LOW (ref 80.0–100.0)
Monocytes Absolute: 0.8 10*3/uL (ref 0.1–1.0)
Monocytes Relative: 8 %
Neutro Abs: 7.1 10*3/uL (ref 1.7–7.7)
Neutrophils Relative %: 77 %
Platelets: 577 10*3/uL — ABNORMAL HIGH (ref 150–400)
RBC: 4.15 MIL/uL — ABNORMAL LOW (ref 4.22–5.81)
RDW: 26.7 % — ABNORMAL HIGH (ref 11.5–15.5)
Smear Review: NORMAL
WBC: 9.3 10*3/uL (ref 4.0–10.5)
nRBC: 0.8 % — ABNORMAL HIGH (ref 0.0–0.2)

## 2021-07-08 LAB — COMPREHENSIVE METABOLIC PANEL
ALT: 9 U/L (ref 0–44)
AST: 17 U/L (ref 15–41)
Albumin: 3.4 g/dL — ABNORMAL LOW (ref 3.5–5.0)
Alkaline Phosphatase: 64 U/L (ref 38–126)
Anion gap: 7 (ref 5–15)
BUN: 12 mg/dL (ref 6–20)
CO2: 26 mmol/L (ref 22–32)
Calcium: 8.6 mg/dL — ABNORMAL LOW (ref 8.9–10.3)
Chloride: 105 mmol/L (ref 98–111)
Creatinine, Ser: 0.79 mg/dL (ref 0.61–1.24)
GFR, Estimated: >60 mL/min (ref >60)
Glucose, Bld: 85 mg/dL (ref 70–99)
Potassium: 4.3 mmol/L (ref 3.5–5.1)
Sodium: 138 mmol/L (ref 135–145)
Total Bilirubin: 0.5 mg/dL (ref 0.3–1.2)
Total Protein: 6.1 g/dL — ABNORMAL LOW (ref 6.5–8.1)

## 2021-07-08 LAB — PATHOLOGIST SMEAR REVIEW

## 2021-07-08 LAB — SURGICAL PATHOLOGY

## 2021-07-08 MED ORDER — SODIUM CHLORIDE 0.9 % IV SOLN
300.0000 mg | INTRAVENOUS | Status: DC
  Administered 2021-07-10: 300 mg via INTRAVENOUS
  Filled 2021-07-08: qty 300

## 2021-07-08 MED ORDER — PEG 3350-KCL-NA BICARB-NACL 420 G PO SOLR
4000.0000 mL | Freq: Once | ORAL | Status: AC
  Administered 2021-07-11: 4000 mL via ORAL
  Filled 2021-07-08: qty 4000

## 2021-07-08 NOTE — Progress Notes (Signed)
[name] is legal guardian d/w her in detail about Mr Kindred dx and recommendation for Right colectomy. She is willing to sign consent. She understands the procedure, risks, benefits and possible complications.  ?Mr Mehringer is a ward of the state and Tommie Ard is the legal guardian. ?I have spent total of 50 minutes today in the care and coordination of Mr Moga. Including counseling legal guardian and assessing him and performing appropriate documentation. ?

## 2021-07-08 NOTE — Progress Notes (Signed)
CC:  Adenocarcinoma of colon ([place]) ? ?Subjective: ?He HAd dinner and breakfast, tolerated it well. NO N/V ?Hb 8 thi am ? ?Objective: ?Vital signs in last 24 hours: ?Temp:  [98 ?F (36.7 ?C)-98.3 ?F (36.8 ?C)] 98.2 ?F (36.8 ?C) (03/24 9381) ?Pulse Rate:  [72-103] 103 (03/24 0733) ?Resp:  [16-18] 17 (03/24 0733) ?BP: (108-138)/(64-98) 108/64 (03/24 8299) ?SpO2:  [99 %-100 %] 100 % (03/24 0733) ?Last BM Date : 07/07/21 (per pt) ? ?Intake/Output from previous day: ?03/23 0701 - 03/24 0700 ?In: 403 [I.V.:403] ?Out: 1375 [BZJIR:[id number]] ?Intake/Output this shift: ?No intake/output data recorded. ? ?Physical exam: ?NAD , alert ?ABd: soft, + bs,  ?Ext: no edema, well perfused ?Neuro: gcs 15 no motor or sens deficits ?Psych: competent, he is calm and pleasant understands the situation in detail, no evidence of positive symptoms. ? ? ?Lab Results: ?CBC  ?Recent Labs  ?  07/07/21 ?0446 07/08/21 ?0958  ?WBC 7.4 9.3  ?HGB 7.4* 8.0*  ?HCT 27.4* 29.4*  ?PLT 524* 577*  ? ?BMET ?Recent Labs  ?  07/07/21 ?0446 07/08/21 ?0958  ?NA 141 138  ?K 4.3 4.3  ?CL 110 105  ?CO2 26 26  ?GLUCOSE 89 85  ?BUN 9 12  ?CREATININE 0.95 0.79  ?CALCIUM 9.0 8.6*  ? ?PT/INR ?No results for input(s): LABPROT, INR in the last 72 hours. ?ABG ?No results for input(s): PHART, HCO3 in the last 72 hours. ? ?Invalid input(s): PCO2, PO2 ? ?Studies/Results: ?CT CHEST ABDOMEN PELVIS W CONTRAST ? ?Result Date: 07/07/2021 ?CLINICAL DATA:  Colon carcinoma, evaluate for metastatic disease EXAM: CT CHEST, ABDOMEN, AND PELVIS WITH CONTRAST TECHNIQUE: Multidetector CT imaging of the chest, abdomen and pelvis was performed following the standard protocol during bolus administration of intravenous contrast. RADIATION DOSE REDUCTION: This exam was performed according to the departmental dose-optimization program which includes automated exposure control, adjustment of the mA and/or kV according to patient size and/or use of iterative reconstruction technique. CONTRAST:  173m  OMNIPAQUE IOHEXOL 300 MG/ML  SOLN COMPARISON:  None. FINDINGS: CT CHEST FINDINGS Cardiovascular: Unremarkable. Mediastinum/Nodes: No significant lymphadenopathy seen in the mediastinum. Lungs/Pleura: There are no discrete lung nodules. There is no focal pulmonary consolidation. There is no pleural effusion or pneumothorax. CT ABDOMEN PELVIS FINDINGS Hepatobiliary: No focal abnormality is seen in the liver. There is no dilation of bile ducts. Gallbladder is unremarkable. There is minimal amount of ascites adjacent to the liver. Pancreas: No focal abnormality is seen. Spleen: Unremarkable. Adrenals/Urinary Tract: Adrenals are not enlarged. There is no hydronephrosis. There are no renal or ureteral stones. Urinary bladder is unremarkable. Stomach/Bowel: Stomach is unremarkable. Small bowel loops are not dilated. Appendix is not dilated. There is abnormal circumferential wall thickening in the proximal transverse colon close to the hepatic flexure. Length of the lesion is proximally 6.2 cm. Wall thickness measures up to 2.2 cm. No other focal abnormality is seen in the colon. There is fluid in the lumen of rectosigmoid. Vascular/Lymphatic: Vascular structures are unremarkable. There are abnormally enlarged lymph nodes immediately superior to the circumferential colonic mass. Lymph nodes measure up to 2.5 cm in short axis. Reproductive: Unremarkable. Other: Small amount of ascites is present in the pelvic cavity. No definite demonstrable discrete nodules seen in the peritoneum. Musculoskeletal: There is 6 mm sclerotic density in the right acetabulum. There is 15 x 6 mm linear sclerotic density in the neck of right femur. IMPRESSION: There is circumferential soft tissue mass lesion in the proximal transverse colon measuring 6.2 cm in length and  2.2 cm in maximal wall thickness. This finding would be consistent with history of colon carcinoma. There are pathologically enlarged lymph nodes immediately superior to the  circumferential colonic lesion suggesting metastatic lymphadenopathy. There are 2 small sclerotic densities in the right acetabulum and neck of proximal right femur which may suggest incidental benign process such as bone islands or sclerotic metastatic disease. No other discrete skeletal lesions are seen. If clinically warranted, radionuclide bone scan or PET-CT should be considered. If the patient has any prior studies in outside institutions, they would be of help to assess stability or interval change. Small amount of ascites is present in the pelvic cavity and adjacent to the liver. This may be due to nonspecific enteritis or suggest peritoneal metastatic disease. As far as seen, no discrete soft tissue nodules are seen in the peritoneal surface. No other signs metastatic disease are noted. Electronically Signed   By: Elmer Picker M.D.   On: 07/07/2021 16:20   ? ?Anti-infectives: ?Anti-infectives (From admission, onward)  ? ? None  ? ?  ? ? ?Assessment/Plan: ?[place] Right colon ?Scheduled for lap right colectomy Tuesday next week ?D/w procedure  w pt in detail. Risks, benefits and possible complications .  Including but not limited to: Bleeding, infection need for ostomy.  We will start bowel prep on Monday morning. ?Discussed with Dr. Janese Banks in detail. ? ? ? ? ?Caroleen Hamman, MD, FACS ? ?07/08/2021 ? ? ? ?  ?

## 2021-07-08 NOTE — Progress Notes (Signed)
[facility] Progress Note ? ?Patient: Connor Wilkinson    OJJ:[id number]  DOA: 07/05/2021    ?Date of Service: the patient was seen and examined on 07/08/2021 ? ?Brief hospital course: ?36 year old male with past medical history of tobacco abuse and bipolar disorder plus PTSD from a group home was receiving work-up for fatigue and found to have low hemoglobin and sent to the emergency room on 3/21.  Lab work revealed hemoglobin of 4.0 with repeat of 4.5 and MCV of 63, with further work-up noting iron deficiency anemia.  Patient admitted to the hospitalist service and transfused 2 units of packed red blood cells.  Patient seen by GI with plans for EGD and colonoscopy on 3/22. ? ?Prior to colonoscopy, hemoglobin on morning of 3/22 at 6.9 and as per anesthesia request, additional unit of packed red blood cells transfused.  EGD unremarkable.  Gastroenterology unable to do colonoscopy due to poor prep with still moderate amount of stool present.  Colonoscopy done 3/23 and found to have partially obstructing mass in transverse colon.  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  Oncology and general surgery consulted.  General surgery plans to take patient for mass removal and partial colectomy early next week once hemoglobin has improved. ? ?Assessment and Plan: ?Assessment and Plan: ?* Adenocarcinoma of colon ([place]) ?This is the cause of patient's iron deficiency anemia.  May have been going on for some time now.  Appreciate GI help.  Confirmed with pathology.  CEA level pending.  Oncology consulted.  General surgery plans to take patient for mass removal and partial colectomy early next week once hemoglobin > 8.5. ? ?Iron deficiency anemia ?Status post 3 units packed red blood cells.  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  Underwent iron transfusion on 3/23 ? ?Nicotine dependence ?Smoking cessation has been discussed with patient in detail ?We will start him on nicotine gum 2 mg as needed ? ?Bipolar 1 disorder  ([place]) ?Stable ?Continue Abilify, Cogentin, fluoxetine and buspirone ? ? ? ? ? ? ?Body mass index is 23.73 kg/m?.  ?  ?   ? ?Consultants: ?Gastroenterology ?General surgery ?Medical oncology ? ?Procedures: ?Endoscopy 3/22: Unremarkable ?Colonoscopy 3/23: Near occluding mass in the transverse colon ?Transfusion 3 units packed red blood cells ? ?Antimicrobials: ?Preop Ancef ? ?Code Status: Full code ? ? ?Subjective: Patient doing okay, no complaints.  Tolerating p.o. ? ?Objective: ?Vital signs were reviewed and unremarkable. ?Vitals:  ? 07/08/21 0523 07/08/21 0733  ?BP: 114/71 108/64  ?Pulse: 72 (!) 103  ?Resp: 18 17  ?Temp: 98.1 ?F (36.7 ?C) 98.2 ?F (36.8 ?C)  ?SpO2: 99% 100%  ? ? ?Intake/Output Summary (Last 24 hours) at 07/08/2021 1310 ?Last data filed at 07/07/2021 2206 ?Gross per 24 hour  ?Intake 3 ml  ?Output 1375 ml  ?Net -1372 ml  ? ?Filed Weights  ? 07/05/21 1006 07/06/21 1100  ?Weight: 79.4 kg 79.4 kg  ? ?Body mass index is 23.73 kg/m?. ? ?Exam: ? ?General: Alert and oriented x3, no acute distress ?HEENT: Normocephalic, atraumatic, mucous membranes are moist ?Cardiovascular: Regular rate and rhythm, S1-S2 ?Respiratory: Clear to auscultation bilaterally ?Abdomen: Soft, nontender,, positive bowel sounds ?Musculoskeletal: No clubbing cyanosis or edema ?Skin: No skin breaks, tears or lesions ?Psychiatry: Appropriate, no evidence of psychoses ?Neurology: No focal deficits ? ?Data Reviewed: ?Hemoglobin today at 8.0 with an MCV of 71. ? ?Disposition:  ?Status is: Inpatient ?Patient is inpatient status and needs to continue as he is receiving acute care and services  ?  ? ?Anticipated  discharge date: 3/30 ? ?Remaining issues to be resolved so that patient can be discharged: Plans for surgery early next week ? ? ?Family Communication: Updated legal guardian by phone ?DVT Prophylaxis: ?SCDs Start: 07/05/21 1313 ? ? ? ?Author: ?Annita Brod ,MD ?07/08/2021 1:10 PM ? ?To reach On-call, see care teams to locate the  attending and reach out via [email or web address]. ?Between 7PM-7AM, please contact night-coverage ?If you still have difficulty reaching the attending provider, please page the [facility] (Director on Call) for [facility] on amion for assistance. ? ?

## 2021-07-08 NOTE — Anesthesia Postprocedure Evaluation (Signed)
Anesthesia Post Note ? ?Patient: Connor Wilkinson ? ?Procedure(s) Performed: COLONOSCOPY WITH PROPOFOL ? ?Patient location during evaluation: Endoscopy ?Anesthesia Type: General ?Level of consciousness: awake and alert ?Pain management: pain level controlled ?Vital Signs Assessment: post-procedure vital signs reviewed and stable ?Respiratory status: spontaneous breathing, nonlabored ventilation, respiratory function stable and patient connected to nasal cannula oxygen ?Cardiovascular status: blood pressure returned to baseline and stable ?Postop Assessment: no apparent nausea or vomiting ?Anesthetic complications: no ? ? ?No notable events documented. ? ? ?Last Vitals:  ?Vitals:  ? 07/08/21 0523 07/08/21 0733  ?BP: 114/71 108/64  ?Pulse: 72 (!) 103  ?Resp: 18 17  ?Temp: 36.7 ?C 36.8 ?C  ?SpO2: 99% 100%  ?  ?Last Pain:  ?Vitals:  ? 07/08/21 0800  ?TempSrc:   ?PainSc: 0-No pain  ? ? ?  ?  ?  ?  ?  ?  ? ?Arita Miss ? ? ? ? ?

## 2021-07-08 NOTE — Consult Note (Signed)
? ?Hematology/Oncology Consult note ?[place] ?[phone] [phone] ? ?Patient Care Team: ?[name], MD as PCP - General (Internal Medicine)  ? ?Name of the patient: Connor Wilkinson  ?[id number]  ?Mar 29, 1986  ? ? ?Reason for consult: New diagnosis of colon cancer ?  ?Requesting physician: Dr. Maryland Pink ? ?Date of visit:07/08/2021 ? ? ? ?History of presenting illness-patient is a 36 year old male with a past medical history significant for mild bipolar disorder who is a ward of the stateAnd was admitted to the hospital for severe anemia.  He was found to have a hemoglobin of 4.5/18.7 on admission.  He received blood transfusion as well as IV iron and his hemoglobin is presently up to an 8.  He underwent colonoscopy which showed malignant partially obstructing tumor that was circumferential 80 cm proximal to the anus.  Biopsy consistent with moderately differentiated adenocarcinoma with mucinous features.Baseline CEA is currently pending.  CT chest abdomen and pelvis with contrast did not show any evidence of distant metastatic disease.  2 small sclerotic densities in the acetabulum and proximal right femur likely benign process.  Pathologically enlarged lymph nodes superior to circumferential colonic lesion suggesting metastatic adenopathy. ? ?ECOG PS- 1 ? ?Pain scale- 0 ? ? ?Review of systems- Review of Systems  ?Constitutional:  Positive for malaise/fatigue. Negative for chills, fever and weight loss.  ?HENT:  Negative for congestion, ear discharge and nosebleeds.   ?Eyes:  Negative for blurred vision.  ?Respiratory:  Negative for cough, hemoptysis, sputum production, shortness of breath and wheezing.   ?Cardiovascular:  Negative for chest pain, palpitations, orthopnea and claudication.  ?Gastrointestinal:  Negative for abdominal pain, blood in stool, constipation, diarrhea, heartburn, melena, nausea and vomiting.  ?Genitourinary:  Negative for dysuria, flank pain,  frequency, hematuria and urgency.  ?Musculoskeletal:  Negative for back pain, joint pain and myalgias.  ?Skin:  Negative for rash.  ?Neurological:  Negative for dizziness, tingling, focal weakness, seizures, weakness and headaches.  ?Endo/Heme/Allergies:  Does not bruise/bleed easily.  ?Psychiatric/Behavioral:  Negative for depression and suicidal ideas. The patient does not have insomnia.   ? ?Allergies  ?Allergen Reactions  ? Geodon [Ziprasidone Hcl] Anaphylaxis  ? ? ?Patient Active Problem List  ? Diagnosis Date Noted  ? Adenocarcinoma of colon ([place])   ? Iron deficiency anemia 07/05/2021  ? Bipolar 1 disorder ([place])   ? Nicotine dependence   ? Orthostasis 10/23/2017  ? Weight loss 10/23/2017  ? Nightmares 10/23/2017  ? Seizures ([place]) 10/23/2017  ? Depression 10/23/2017  ? Syncope 10/19/2017  ? ? ? ?Past Medical History:  ?Diagnosis Date  ? Bipolar 1 disorder ([place])   ? PTSD (post-traumatic stress disorder)   ? ? ? ?Past Surgical History:  ?Procedure Laterality Date  ? COLONOSCOPY WITH PROPOFOL N/A 07/06/2021  ? Procedure: COLONOSCOPY WITH PROPOFOL;  Surgeon: Lin Landsman, MD;  Location: [facility] ENDOSCOPY;  Service: Gastroenterology;  Laterality: N/A;  ? COLONOSCOPY WITH PROPOFOL N/A 07/07/2021  ? Procedure: COLONOSCOPY WITH PROPOFOL;  Surgeon: Lin Landsman, MD;  Location: [facility] ENDOSCOPY;  Service: Gastroenterology;  Laterality: N/A;  ? ESOPHAGOGASTRODUODENOSCOPY (EGD) WITH PROPOFOL N/A 07/06/2021  ? Procedure: ESOPHAGOGASTRODUODENOSCOPY (EGD) WITH PROPOFOL;  Surgeon: Lin Landsman, MD;  Location: [facility] ENDOSCOPY;  Service: Gastroenterology;  Laterality: N/A;  ? GIVENS CAPSULE STUDY N/A 07/06/2021  ? Procedure: GIVENS CAPSULE STUDY;  Surgeon: Lin Landsman, MD;  Location: [facility] ENDOSCOPY;  Service: Gastroenterology;  Laterality: N/A;  ? ? ?Social History  ? ?Socioeconomic History  ? Marital status:  Single  ?  Spouse name: Not on file  ? Number of children: Not on file  ? Years of education: Not on  file  ? Highest education level: Not on file  ?Occupational History  ? Not on file  ?Tobacco Use  ? Smoking status: Every Day  ? Smokeless tobacco: Never  ?Vaping Use  ? Vaping Use: Every day  ?Substance and Sexual Activity  ? Alcohol use: Not Currently  ? Drug use: Not Currently  ? Sexual activity: Not on file  ?Other Topics Concern  ? Not on file  ?Social History Narrative  ? Not on file  ? ?Social Determinants of Health  ? ?Financial Resource Strain: Not on file  ?Food Insecurity: Not on file  ?Transportation Needs: Not on file  ?Physical Activity: Not on file  ?Stress: Not on file  ?Social Connections: Not on file  ?Intimate Partner Violence: Not on file  ? ?  ?History reviewed. No pertinent family history. ? ? ?Current Facility-Administered Medications:  ?  0.9 %  sodium chloride infusion, 10 mL/hr, Intravenous, Once, [name], MD ?  0.9 %  sodium chloride infusion, 250 mL, Intravenous, PRN, Agbata, Tochukwu, MD ?  0.9 %  sodium chloride infusion, , Intravenous, Continuous, Vanga, Tally Due, MD, Last Rate: 10 mL/hr at 07/06/21 1154, Continued from Pre-op at 07/06/21 1154 ?  0.9 %  sodium chloride infusion, , Intravenous, Continuous, Vanga, Tally Due, MD, Stopped at 07/06/21 1311 ?  acetaminophen (TYLENOL) tablet 650 mg, 650 mg, Oral, Q6H PRN **OR** acetaminophen (TYLENOL) suppository 650 mg, 650 mg, Rectal, Q6H PRN, Agbata, Tochukwu, MD ?  ARIPiprazole (ABILIFY) tablet 10 mg, 10 mg, Oral, Daily, Agbata, Tochukwu, MD, 10 mg at 07/08/21 0806 ?  benztropine (COGENTIN) tablet 2 mg, 2 mg, Oral, QHS, Agbata, Tochukwu, MD, 2 mg at 07/07/21 2205 ?  busPIRone (BUSPAR) tablet 10 mg, 10 mg, Oral, BID, Agbata, Tochukwu, MD, 10 mg at 07/08/21 0806 ?  feeding supplement (ENSURE ENLIVE / ENSURE PLUS) liquid 237 mL, 237 mL, Oral, BID BM, Vanga, Tally Due, MD, 237 mL at 07/08/21 1314 ?  FLUoxetine (PROZAC) capsule 20 mg, 20 mg, Oral, Daily, Agbata, Tochukwu, MD, 20 mg at 07/08/21 0806 ?  [START ON 07/10/2021]  iron sucrose (VENOFER) 300 mg in sodium chloride 0.9 % 250 mL IVPB, 300 mg, Intravenous, Q3 days, Beers, Brandon D, RPH ?  nicotine polacrilex (NICORETTE) gum 2 mg, 2 mg, Oral, PRN, Agbata, Tochukwu, MD, 2 mg at 07/08/21 0808 ?  ondansetron (ZOFRAN) tablet 4 mg, 4 mg, Oral, Q6H PRN **OR** ondansetron (ZOFRAN) injection 4 mg, 4 mg, Intravenous, Q6H PRN, Agbata, Tochukwu, MD ?  [START ON 07/11/2021] polyethylene glycol-electrolytes (NuLYTELY) solution 4,000 mL, 4,000 mL, Oral, Once, Pabon, Diego F, MD ?  prazosin (MINIPRESS) capsule 2 mg, 2 mg, Oral, BID, Agbata, Tochukwu, MD, 2 mg at 07/08/21 0806 ?  sodium chloride flush (NS) 0.9 % injection 3 mL, 3 mL, Intravenous, Q12H, Agbata, Tochukwu, MD, 3 mL at 07/08/21 0807 ?  sodium chloride flush (NS) 0.9 % injection 3 mL, 3 mL, Intravenous, PRN, Agbata, Tochukwu, MD ? ? ?Physical exam:  ?Vitals:  ? 07/07/21 2138 07/08/21 0523 07/08/21 0733 07/08/21 1533  ?BP: 117/69 114/71 108/64 127/78  ?Pulse: 79 72 (!) 103 90  ?Resp: '16 18 17 18  '$ ?Temp: 98 ?F (36.7 ?C) 98.1 ?F (36.7 ?C) 98.2 ?F (36.8 ?C) 98.2 ?F (36.8 ?C)  ?TempSrc: Oral Oral Oral Oral  ?SpO2: 100% 99% 100% 100%  ?Weight:      ?  Height:      ? ?Physical Exam ?Constitutional:   ?   General: He is not in acute distress. ?Cardiovascular:  ?   Rate and Rhythm: Normal rate and regular rhythm.  ?   Heart sounds: Normal heart sounds.  ?Pulmonary:  ?   Effort: Pulmonary effort is normal.  ?   Breath sounds: Normal breath sounds.  ?Abdominal:  ?   General: Bowel sounds are normal.  ?   Palpations: Abdomen is soft.  ?Skin: ?   General: Skin is warm and dry.  ?Neurological:  ?   Mental Status: He is alert and oriented to person, place, and time.  ?  ? ? ? ? ?  Latest Ref Rng & Units 07/08/2021  ?  9:58 AM  ?CMP  ?Glucose 70 - 99 mg/dL 85    ?BUN 6 - 20 mg/dL 12    ?Creatinine 0.61 - 1.24 mg/dL 0.79    ?Sodium 135 - 145 mmol/L 138    ?Potassium 3.5 - 5.1 mmol/L 4.3    ?Chloride 98 - 111 mmol/L 105    ?CO2 22 - 32 mmol/L 26     ?Calcium 8.9 - 10.3 mg/dL 8.6    ?Total Protein 6.5 - 8.1 g/dL 6.1    ?Total Bilirubin 0.3 - 1.2 mg/dL 0.5    ?Alkaline Phos 38 - 126 U/L 64    ?AST 15 - 41 U/L 17    ?ALT 0 - 44 U/L 9    ? ? ?  Latest Ref Rng

## 2021-07-08 NOTE — Plan of Care (Signed)

## 2021-07-08 NOTE — TOC Progression Note (Signed)
Transition of Care (TOC) - Progression Note  ? ? ?Patient Details  ?Name: Martinique Masoud ?MRN: [id number] ?Date of Birth: Sep 11, 1985 ? ?Transition of Care (TOC) CM/SW Contact  ?Pete Pelt, RN ?Phone Number: ?07/08/2021, 1:55 PM ? ?Clinical Narrative:   Potential surgery next week.  Continued medical work up being conducted at this time.TOC to follow. ? ? ? ?Expected Discharge Plan: Group Home ([place]) ?Barriers to Discharge: Continued Medical Work up ? ?Expected Discharge Plan and Services ?Expected Discharge Plan: Group Home ([place]) ?  ?Discharge Planning Services: CM Consult ?  ?Living arrangements for the past 2 months: Group Home ?                ?  ?  ?  ?  ?  ?  ?  ?  ?  ?  ? ? ?Social Determinants of Health (SDOH) Interventions ?  ? ?Readmission Risk Interventions ? ?  07/06/2021  ?  4:18 PM  ?Readmission Risk Prevention Plan  ?Post Dischage Appt Complete  ?Medication Screening Complete  ?Transportation Screening Complete  ? ? ?

## 2021-07-09 LAB — CBC
HCT: 27.3 % — ABNORMAL LOW (ref 39.0–52.0)
Hemoglobin: 7.4 g/dL — ABNORMAL LOW (ref 13.0–17.0)
MCH: 19.3 pg — ABNORMAL LOW (ref 26.0–34.0)
MCHC: 27.1 g/dL — ABNORMAL LOW (ref 30.0–36.0)
MCV: 71.3 fL — ABNORMAL LOW (ref 80.0–100.0)
Platelets: 498 10*3/uL — ABNORMAL HIGH (ref 150–400)
RBC: 3.83 MIL/uL — ABNORMAL LOW (ref 4.22–5.81)
RDW: 27.5 % — ABNORMAL HIGH (ref 11.5–15.5)
WBC: 8.7 10*3/uL (ref 4.0–10.5)
nRBC: 0.3 % — ABNORMAL HIGH (ref 0.0–0.2)

## 2021-07-09 LAB — CEA: CEA: 1.8 ng/mL (ref 0.0–4.7)

## 2021-07-09 NOTE — Progress Notes (Signed)
[facility] Progress Note ? ?Patient: Connor Wilkinson    [id number]  DOA: 07/05/2021    ?Date of Service: the patient was seen and examined on 07/09/2021 ? ?Brief hospital course: ?36 year old male with past medical history of tobacco abuse and bipolar disorder plus PTSD from a group home was receiving work-up for fatigue and found to have low hemoglobin and sent to the emergency room on 3/21.  Lab work revealed hemoglobin of 4.0 with repeat of 4.5 and MCV of 63, with further work-up noting iron deficiency anemia.  Patient admitted to the hospitalist service and transfused 2 units of packed red blood cells.  Patient seen by GI with plans for EGD and colonoscopy on 3/22. ? ?Prior to colonoscopy, hemoglobin on morning of 3/22 at 6.9 and as per anesthesia request, additional unit of packed red blood cells transfused.  EGD unremarkable.  Gastroenterology unable to do colonoscopy due to poor prep with still moderate amount of stool present.  Colonoscopy done 3/23 and found to have partially obstructing mass in transverse colon.  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  Oncology and general surgery consulted.  General surgery plans to take patient for mass removal and partial colectomy early next week once hemoglobin has improved. ? ?Assessment and Plan: ?Assessment and Plan: ?* Adenocarcinoma of colon ([place]) ?This is the cause of patient's iron deficiency anemia.  May have been going on for some time now.  Appreciate GI help.  Confirmed with pathology.  CEA level pending.  Oncology following.  CT of chest abdomen pelvis notes no evidence of metastatic disease.  Possible some adjacent lymph node spread.  General surgery plans to take patient for mass removal and partial colectomy early next week once hemoglobin > 8.5. ? ?Iron deficiency anemia ?Status post 3 units packed red blood cells.  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  Underwent iron transfusion on 3/23.  Hemoglobin today at 7.4.  Likely should improve once  mass is resected.  IV iron and blood transfusion before surgery to get hemoglobin > 8.5. ? ?Nicotine dependence ?Smoking cessation has been discussed with patient in detail ?We will start him on nicotine gum 2 mg as needed ? ?Bipolar 1 disorder ([place]) ?Stable ?Continue Abilify, Cogentin, fluoxetine and buspirone.  Patient is a ward of the state and decisions to be made through his guardian. ? ? ? ? ? ? ?Body mass index is 23.73 kg/m?.  ?  ?   ? ?Consultants: ?Gastroenterology ?General surgery ?Medical oncology ? ?Procedures: ?Endoscopy 3/22: Unremarkable ?Colonoscopy 3/23: Near occluding mass in the transverse colon ?Transfusion 3 units packed red blood cells ? ?Antimicrobials: ?Preop Ancef ? ?Code Status: Full code ? ? ?Subjective: Patient doing okay, no complaints.  Ready to have surgery. ? ?Objective: ?Vital signs were reviewed and unremarkable. ?Vitals:  ? 07/09/21 0814 07/09/21 1153  ?BP: 131/83 127/77  ?Pulse: 83 94  ?Resp: 18 16  ?Temp: 98.5 ?F (36.9 ?C) 98.1 ?F (36.7 ?C)  ?SpO2: 100% 100%  ? ? ?Intake/Output Summary (Last 24 hours) at 07/09/2021 1351 ?Last data filed at 07/09/2021 0900 ?Gross per 24 hour  ?Intake 480 ml  ?Output --  ?Net 480 ml  ? ? ?Filed Weights  ? 07/05/21 1006 07/06/21 1100  ?Weight: 79.4 kg 79.4 kg  ? ?Body mass index is 23.73 kg/m?. ? ?Exam: ? ?General: Alert and oriented x3, no acute distress ?HEENT: Normocephalic, atraumatic, mucous membranes are moist ?Cardiovascular: Regular rate and rhythm, S1-S2 ?Respiratory: Clear to auscultation bilaterally ?Abdomen: Soft, nontender,, positive bowel sounds ?  Musculoskeletal: No clubbing cyanosis or edema ?Skin: No skin breaks, tears or lesions ?Psychiatry: Appropriate, no evidence of psychoses ?Neurology: No focal deficits ? ?Data Reviewed: ?Hemoglobin at 7.4 today. ? ?Disposition:  ?Status is: Inpatient ?Patient is inpatient status and needs to continue as he is receiving acute care and services  ?  ? ?Anticipated discharge date: 3/30 ? ?Remaining  issues to be resolved so that patient can be discharged: Plans for surgery early next week ? ? ?Family Communication: Updated legal guardian by phone ?DVT Prophylaxis: ?SCDs Start: 07/05/21 1313 ? ? ? ?Author: ?Annita Brod ,MD ?07/09/2021 1:51 PM ? ?To reach On-call, see care teams to locate the attending and reach out via [email or web address]. ?Between 7PM-7AM, please contact night-coverage ?If you still have difficulty reaching the attending provider, please page the [facility] (Director on Call) for [facility] on amion for assistance. ? ?

## 2021-07-10 LAB — CBC
HCT: 29.2 % — ABNORMAL LOW (ref 39.0–52.0)
Hemoglobin: 8.1 g/dL — ABNORMAL LOW (ref 13.0–17.0)
MCH: 20.4 pg — ABNORMAL LOW (ref 26.0–34.0)
MCHC: 27.7 g/dL — ABNORMAL LOW (ref 30.0–36.0)
MCV: 73.4 fL — ABNORMAL LOW (ref 80.0–100.0)
Platelets: 481 10*3/uL — ABNORMAL HIGH (ref 150–400)
RBC: 3.98 MIL/uL — ABNORMAL LOW (ref 4.22–5.81)
RDW: 29.2 % — ABNORMAL HIGH (ref 11.5–15.5)
WBC: 9.7 10*3/uL (ref 4.0–10.5)
nRBC: 0 % (ref 0.0–0.2)

## 2021-07-10 NOTE — Progress Notes (Signed)
[facility] Progress Note ? ?Patient: Connor Wilkinson    CHE:[id number]  DOA: 07/05/2021    ?Date of Service: the patient was seen and examined on 07/10/2021 ? ?Brief hospital course: ?36 year old male with past medical history of tobacco abuse and bipolar disorder plus PTSD from a group home was receiving work-up for fatigue and found to have low hemoglobin and sent to the emergency room on 3/21.  Lab work revealed hemoglobin of 4.0 with repeat of 4.5 and MCV of 63, with further work-up noting iron deficiency anemia.  Patient admitted to the hospitalist service and transfused 2 units of packed red blood cells.  Patient seen by GI with plans for EGD and colonoscopy on 3/22. ? ?Prior to colonoscopy, hemoglobin on morning of 3/22 at 6.9 and as per anesthesia request, additional unit of packed red blood cells transfused.  EGD unremarkable.  Gastroenterology unable to do colonoscopy due to poor prep with still moderate amount of stool present.  Colonoscopy done 3/23 and found to have partially obstructing mass in transverse colon.  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  Oncology and general surgery consulted.  General surgery plans to take patient for mass removal and partial colectomy on Tuesday once hemoglobin has improved. ? ?Assessment and Plan: ?Assessment and Plan: ?* Adenocarcinoma of colon ([place]) ?This is the cause of patient's iron deficiency anemia.  May have been going on for some time now.  Appreciate GI help.  Confirmed with pathology.  CEA level pending.  Oncology following.  CT of chest abdomen pelvis notes no evidence of metastatic disease.  Possible some adjacent lymph node spread.  General surgery plans to take patient for mass removal and partial colectomy Tuesday once hemoglobin > 8.5. ? ?Iron deficiency anemia ?Status post 3 units packed red blood cells.  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  Underwent iron transfusion on 3/23.  Hemoglobin today at 8.1.  Likely should improve once mass is  resected.  IV iron and blood transfusion before surgery to get hemoglobin > 8.5. ? ?Nicotine dependence ?Smoking cessation has been discussed with patient in detail ?We will start him on nicotine gum 2 mg as needed ? ?Bipolar 1 disorder ([place]) ?Stable ?Continue Abilify, Cogentin, fluoxetine and buspirone.  Patient is a ward of the state and decisions to be made through his guardian. ? ? ? ? ? ? ?Body mass index is 23.73 kg/m?.  ?  ?   ? ?Consultants: ?Gastroenterology ?General surgery ?Medical oncology ? ?Procedures: ?Endoscopy 3/22: Unremarkable ?Colonoscopy 3/23: Near occluding mass in the transverse colon ?Transfusion 3 units packed red blood cells ? ?Antimicrobials: ?Preop Ancef ? ?Code Status: Full code ? ? ?Subjective: Patient doing okay, no complaints.  Ready to have surgery. ? ?Objective: ?Vital signs were reviewed and unremarkable. ?Vitals:  ? 07/10/21 0345 07/10/21 0725  ?BP: 124/75 (!) 142/98  ?Pulse: 93 89  ?Resp: 18 16  ?Temp: 98.4 ?F (36.9 ?C) 98.6 ?F (37 ?C)  ?SpO2: 100% 98%  ? ? ?Intake/Output Summary (Last 24 hours) at 07/10/2021 1410 ?Last data filed at 07/10/2021 0900 ?Gross per 24 hour  ?Intake 840 ml  ?Output --  ?Net 840 ml  ? ?Filed Weights  ? 07/05/21 1006 07/06/21 1100  ?Weight: 79.4 kg 79.4 kg  ? ?Body mass index is 23.73 kg/m?. ? ?Exam: ? ?General: Alert and oriented x3, no acute distress ?HEENT: Normocephalic, atraumatic, mucous membranes are moist ?Cardiovascular: Regular rate and rhythm, S1-S2 ?Respiratory: Clear to auscultation bilaterally ?Abdomen: Soft, nontender,, positive bowel sounds ?Musculoskeletal: No clubbing  cyanosis or edema ?Skin: No skin breaks, tears or lesions ?Psychiatry: Appropriate, no evidence of psychoses ?Neurology: No focal deficits ? ?Data Reviewed: ?Hemoglobin at 7.4 today. ? ?Disposition:  ?Status is: Inpatient ?Patient is inpatient status and needs to continue as he is receiving acute care and services  ?  ? ?Anticipated discharge date: 3/30 ? ?Remaining issues  to be resolved so that patient can be discharged: Plans for surgery early next week ? ? ?Family Communication: Updated legal guardian by phone ?DVT Prophylaxis: ?SCDs Start: 07/05/21 1313 ? ? ? ?Author: ?Annita Brod ,MD ?07/10/2021 2:10 PM ? ?To reach On-call, see care teams to locate the attending and reach out via [email or web address]. ?Between 7PM-7AM, please contact night-coverage ?If you still have difficulty reaching the attending provider, please page the [facility] (Director on Call) for [facility] on amion for assistance. ? ?

## 2021-07-11 DIAGNOSIS — C189 Malignant neoplasm of colon, unspecified: Secondary | ICD-10-CM | POA: Diagnosis not present

## 2021-07-11 DIAGNOSIS — F17210 Nicotine dependence, cigarettes, uncomplicated: Secondary | ICD-10-CM | POA: Diagnosis not present

## 2021-07-11 DIAGNOSIS — D500 Iron deficiency anemia secondary to blood loss (chronic): Secondary | ICD-10-CM

## 2021-07-11 DIAGNOSIS — F319 Bipolar disorder, unspecified: Secondary | ICD-10-CM | POA: Diagnosis not present

## 2021-07-11 LAB — PREPARE RBC (CROSSMATCH)

## 2021-07-11 LAB — CBC
HCT: 29 % — ABNORMAL LOW (ref 39.0–52.0)
Hemoglobin: 7.9 g/dL — ABNORMAL LOW (ref 13.0–17.0)
MCH: 20.3 pg — ABNORMAL LOW (ref 26.0–34.0)
MCHC: 27.2 g/dL — ABNORMAL LOW (ref 30.0–36.0)
MCV: 74.6 fL — ABNORMAL LOW (ref 80.0–100.0)
Platelets: 475 10*3/uL — ABNORMAL HIGH (ref 150–400)
RBC: 3.89 MIL/uL — ABNORMAL LOW (ref 4.22–5.81)
RDW: 30.1 % — ABNORMAL HIGH (ref 11.5–15.5)
WBC: 10.2 10*3/uL (ref 4.0–10.5)
nRBC: 0 % (ref 0.0–0.2)

## 2021-07-11 MED ORDER — METRONIDAZOLE 500 MG PO TABS: 500.0000 mg | ORAL_TABLET | Freq: Three times a day (TID) | ORAL | Status: DC

## 2021-07-11 MED ORDER — SODIUM CHLORIDE 0.9 % IV SOLN
2.0000 g | INTRAVENOUS | Status: AC
  Administered 2021-07-12: 2 g via INTRAVENOUS

## 2021-07-11 MED ORDER — SODIUM CHLORIDE 0.9% IV SOLUTION: Freq: Once | INTRAVENOUS | Status: AC

## 2021-07-11 MED ORDER — METRONIDAZOLE 500 MG PO TABS
500.0000 mg | ORAL_TABLET | ORAL | Status: AC
  Administered 2021-07-11 (×3): 500 mg via ORAL
  Filled 2021-07-11 (×3): qty 1

## 2021-07-11 MED ORDER — PEG 3350-KCL-NA BICARB-NACL 420 G PO SOLR
2000.0000 mL | Freq: Once | ORAL | Status: AC
Start: 2021-07-11 — End: 2021-07-11
  Administered 2021-07-11: 2000 mL via ORAL
  Filled 2021-07-11: qty 4000

## 2021-07-11 MED ORDER — NEOMYCIN SULFATE 500 MG PO TABS
500.0000 mg | ORAL_TABLET | Freq: Three times a day (TID) | ORAL | Status: AC
  Administered 2021-07-11 (×3): 500 mg via ORAL
  Filled 2021-07-11 (×3): qty 1

## 2021-07-11 NOTE — Progress Notes (Signed)
[facility] ?SURGICAL PROGRESS NOTE (cpt [phone]) ? ?Hospital Day(s): 5.  ? ?Post op day(s): 4 Days Post-Op.  ? ?Interval History: Patient seen and examined, no acute events or new complaints overnight. Patient reports he is doing well this morning; very nervous. He denies abdominal pain, fever, chills, nausea, emesis. Hgb relatively stable; 7.9 this morning. Pan to transfuse 1 unit pRBCs this morning in anticipation of surgery tomorrow (03/28). He is on CLD. He is having bowel function and does not appreciate any more blood in stool but is not completely sure.  ? ?Review of Systems:  ?Constitutional: denies fever, chills  ?HEENT: denies cough or congestion  ?Respiratory: denies any shortness of breath  ?Cardiovascular: denies chest pain or palpitations  ?Gastrointestinal: denies abdominal pain, N/V ?Genitourinary: denies burning with urination or urinary frequency ? ?Vital signs in last 24 hours: [min-max] current  ?Temp:  [98.4 ?F (36.9 ?C)-99 ?F (37.2 ?C)] 98.7 ?F (37.1 ?C) (03/27 0710) ?Pulse Rate:  [82-110] 82 (03/27 0710) ?Resp:  [16-18] 16 (03/27 0710) ?BP: (113-129)/(73-88) 126/88 (03/27 0710) ?SpO2:  [98 %-100 %] 98 % (03/27 0710)     Height: 6' (182.9 cm) Weight: 79.4 kg BMI (Calculated): 23.73  ? ?Intake/Output last 2 shifts:  ?03/26 0701 - 03/27 0700 ?In: 614.6 [P.O.:480; IV Piggyback:134.6] ?Out: -   ? ?Physical Exam:  ?Constitutional: alert, cooperative and no distress  ?HENT: normocephalic without obvious abnormality  ?Eyes: PERRL, EOM's grossly intact and symmetric  ?Respiratory: breathing non-labored at rest  ?Cardiovascular: regular rate and sinus rhythm  ?Gastrointestinal: soft, non-tender, and non-distended ?Musculoskeletal: no edema or wounds, motor and sensation grossly intact, NT  ? ? ?Labs:  ? ?  Latest Ref Rng & Units 07/11/2021  ?  6:00 AM 07/10/2021  ?  5:53 AM 07/09/2021  ?  4:44 AM  ?CBC  ?WBC 4.0 - 10.5 K/uL 10.2   9.7   8.7    ?Hemoglobin 13.0 - 17.0 g/dL 7.9   8.1   7.4     ?Hematocrit 39.0 - 52.0 % 29.0   29.2   27.3    ?Platelets 150 - 400 K/uL 475   481   498    ? ? ?  Latest Ref Rng & Units 07/08/2021  ?  9:58 AM 07/07/2021  ?  4:46 AM 07/06/2021  ?  4:44 AM  ?CMP  ?Glucose 70 - 99 mg/dL 85   89   90    ?BUN 6 - 20 mg/dL '12   9   12    '$ ?Creatinine 0.61 - 1.24 mg/dL 0.79   0.95   0.88    ?Sodium 135 - 145 mmol/L 138   141   138    ?Potassium 3.5 - 5.1 mmol/L 4.3   4.3   4.2    ?Chloride 98 - 111 mmol/L 105   110   107    ?CO2 22 - 32 mmol/L '26   26   27    '$ ?Calcium 8.9 - 10.3 mg/dL 8.6   9.0   8.8    ?Total Protein 6.5 - 8.1 g/dL 6.1      ?Total Bilirubin 0.3 - 1.2 mg/dL 0.5      ?Alkaline Phos 38 - 126 U/L 64      ?AST 15 - 41 U/L 17      ?ALT 0 - 44 U/L 9      ? ? ? ?Imaging studies: No new pertinent imaging studies ? ? ?Assessment/Plan: (ICD-10's: C18.9) ?[place]  y.o. 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 ? ? - Okay to continue CLD today; NPO at midnight ? - Will transfuse 1 unit pRBCs this morning in anticipation of OR tomorrow (03/28) ? - Initiate bowel prep today with Neomycin, Flagyl, GoLytely ? - Plan for hand assisted laparoscopic right colectomy tomorrow (03/28) with Dr Dahlia Byes pending OR/Anesthesia availability.  ? - Dr Dahlia Byes has discussed all risks, benefits, and alternatives to above procedure(s) were discussed with the patient's legal guardian [name]), all of their questions were answered to their expressed satisfaction, patient's legal guarding ([name] Joe) expresses wish to proceed, and informed consent was obtained.   ? - Monitor abdominal examination; on-going bowel function ?- Pain control prn; antiemetics prn ?- Monitor H&H; stable  ? - Mobilization as tolerated ?- Further management per primary service; we will follow   ? ?All of the above findings and recommendations were discussed with the patient, and the medical team, and all of patient's questions were answered to his expressed  satisfaction. ? ?-- ?Edison Simon, PA-C ?[facility] ?07/11/2021, 8:18 AM ?[phone] ?M-F: 7am - 4pm ? ?

## 2021-07-11 NOTE — Progress Notes (Signed)
[facility] Progress Note ? ?Patient: Connor Wilkinson    GHW:[id number]  DOA: 07/05/2021    ?Date of Service: the patient was seen and examined on 07/11/2021 ? ?Brief hospital course: ?36 year old male with past medical history of tobacco abuse and bipolar disorder plus PTSD from a group home was receiving work-up for fatigue and found to have low hemoglobin and sent to the emergency room on 3/21.  Lab work revealed hemoglobin of 4.0 with repeat of 4.5 and MCV of 63, with further work-up noting iron deficiency anemia.  Patient admitted to the hospitalist service and transfused 2 units of packed red blood cells.  Patient seen by GI with plans for EGD and colonoscopy on 3/22. ? ?Prior to colonoscopy, hemoglobin on morning of 3/22 at 6.9 and as per anesthesia request, additional unit of packed red blood cells transfused.  EGD unremarkable.  Gastroenterology unable to do colonoscopy due to poor prep with still moderate amount of stool present.  Colonoscopy done 3/23 and found to have partially obstructing mass in transverse colon.  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  Oncology and general surgery consulted.  General surgery plans to take patient for mass removal and partial colectomy on Tuesday once hemoglobin has improved. ? ?Assessment and Plan: ?Assessment and Plan: ?* Adenocarcinoma of colon ([place]) ?This is the cause of patient's iron deficiency anemia.  May have been going on for some time now.  Appreciate GI help.  Confirmed with pathology.  CEA level pending.  Oncology following.  CT of chest abdomen pelvis notes no evidence of metastatic disease.  Possible some adjacent lymph node spread.  General surgery plans to take patient for mass removal and partial colectomy Tuesday once hemoglobin > 8.5. ? ?Iron deficiency anemia ?Status post 3 units packed red blood cells.  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  Underwent iron transfusion on 3/23.  Hemoglobin today at 7.9 and general surgery has ordered 1 unit  packed red blood cells today.  Overall, anemia likely should improve once mass is resected.  IV iron and blood transfusions as needed before surgery to get hemoglobin > 8.5. ? ?Nicotine dependence ?Smoking cessation has been discussed with patient in detail ?We will start him on nicotine gum 2 mg as needed ? ?Bipolar 1 disorder ([place]) ?Stable ?Continue Abilify, Cogentin, fluoxetine and buspirone.  Patient is a ward of the state and decisions to be made through his guardian. ? ? ? ? ? ? ?Body mass index is 23.73 kg/m?.  ?  ?   ? ?Consultants: ?Gastroenterology ?General surgery ?Medical oncology ? ?Procedures: ?Endoscopy 3/22: Unremarkable ?Colonoscopy 3/23: Near occluding mass in the transverse colon ?Transfusion 4 units to date, packed red blood cells ? ?Antimicrobials: ?Preop Ancef ? ?Code Status: Full code ? ? ?Subjective: Patient doing okay, no complaints.  Tolerating clear liquids and bowel prep for surgery for tomorrow. ? ?Objective: ?Vital signs were reviewed and unremarkable. ?Vitals:  ? 07/11/21 1112 07/11/21 1140  ?BP: 117/70 130/83  ?Pulse: 86 93  ?Resp: 15 16  ?Temp: 98 ?F (36.7 ?C) 98.2 ?F (36.8 ?C)  ?SpO2: 100% 100%  ? ? ?Intake/Output Summary (Last 24 hours) at 07/11/2021 1346 ?Last data filed at 07/11/2021 1000 ?Gross per 24 hour  ?Intake 374.61 ml  ?Output --  ?Net 374.61 ml  ? ? ?Filed Weights  ? 07/05/21 1006 07/06/21 1100  ?Weight: 79.4 kg 79.4 kg  ? ?Body mass index is 23.73 kg/m?. ? ?Exam: ? ?General: Alert and oriented x3, no acute distress ?HEENT: Normocephalic, atraumatic,  mucous membranes are moist ?Cardiovascular: Regular rate and rhythm, S1-S2 ?Respiratory: Clear to auscultation bilaterally ?Abdomen: Soft, nontender,, positive bowel sounds ?Musculoskeletal: No clubbing cyanosis or edema ?Skin: No skin breaks, tears or lesions ?Psychiatry: Appropriate, no evidence of psychoses ?Neurology: No focal deficits ? ?Data Reviewed: ?Hemoglobin at 7.9 today, receiving 1 additional unit of  blood ? ?Disposition:  ?Status is: Inpatient ?Patient is inpatient status and needs to continue as he is receiving acute care and services  ?  ? ?Anticipated discharge date: 3/31 ? ?Remaining issues to be resolved so that patient can be discharged: Plans for surgery tomorrow followed by clearance by surgery and plan for by oncology ? ? ?Family Communication: Updated legal guardian by phone ?DVT Prophylaxis: ?SCDs Start: 07/05/21 1313 ? ? ? ?Author: ?Annita Brod ,MD ?07/11/2021 1:46 PM ? ?To reach On-call, see care teams to locate the attending and reach out via [email or web address]. ?Between 7PM-7AM, please contact night-coverage ?If you still have difficulty reaching the attending provider, please page the [facility] (Director on Call) for [facility] on amion for assistance. ? ?

## 2021-07-12 ENCOUNTER — Inpatient Hospital Stay: Payer: Medicaid Other | Admitting: Certified Registered"

## 2021-07-12 ENCOUNTER — Encounter: Payer: Self-pay | Admitting: Internal Medicine

## 2021-07-12 ENCOUNTER — Other Ambulatory Visit: Payer: Self-pay

## 2021-07-12 ENCOUNTER — Encounter
Admission: EM | Disposition: A | Discharge status: Nursing Home (Includes ICF) | Payer: Self-pay | Source: Home / Self Care | Attending: Internal Medicine

## 2021-07-12 DIAGNOSIS — C189 Malignant neoplasm of colon, unspecified: Secondary | ICD-10-CM | POA: Diagnosis not present

## 2021-07-12 HISTORY — PX: COLON RESECTION: SHX5231

## 2021-07-12 LAB — TYPE AND SCREEN
ABO/RH(D): O POS
Antibody Screen: NEGATIVE
Unit division: 0

## 2021-07-12 LAB — BPAM RBC
Blood Product Expiration Date: 202304292359
ISSUE DATE / TIME: 202303271116
Unit Type and Rh: 5100

## 2021-07-12 LAB — CBC
HCT: 31.1 % — ABNORMAL LOW (ref 39.0–52.0)
Hemoglobin: 8.6 g/dL — ABNORMAL LOW (ref 13.0–17.0)
MCH: 21.2 pg — ABNORMAL LOW (ref 26.0–34.0)
MCHC: 27.7 g/dL — ABNORMAL LOW (ref 30.0–36.0)
MCV: 76.6 fL — ABNORMAL LOW (ref 80.0–100.0)
Platelets: 439 10*3/uL — ABNORMAL HIGH (ref 150–400)
RBC: 4.06 MIL/uL — ABNORMAL LOW (ref 4.22–5.81)
RDW: 29.8 % — ABNORMAL HIGH (ref 11.5–15.5)
WBC: 7.2 10*3/uL (ref 4.0–10.5)
nRBC: 0 % (ref 0.0–0.2)

## 2021-07-12 SURGERY — COLECTOMY, HAND-ASSISTED, LAPAROSCOPIC
Anesthesia: General | Site: Abdomen

## 2021-07-12 MED ORDER — PANTOPRAZOLE SODIUM 40 MG IV SOLR
40.0000 mg | Freq: Two times a day (BID) | INTRAVENOUS | Status: DC
  Administered 2021-07-12 – 2021-07-15 (×6): 40 mg via INTRAVENOUS
  Filled 2021-07-12 (×6): qty 10

## 2021-07-12 MED ORDER — OXYCODONE HCL 5 MG PO TABS
10.0000 mg | ORAL_TABLET | ORAL | Status: DC | PRN
  Administered 2021-07-13 (×2): 10 mg via ORAL
  Filled 2021-07-12 (×2): qty 2

## 2021-07-12 MED ORDER — ACETAMINOPHEN 10 MG/ML IV SOLN
INTRAVENOUS | Status: AC
  Administered 2021-07-12: 1000 mg via INTRAVENOUS
  Filled 2021-07-12: qty 100

## 2021-07-12 MED ORDER — DEXAMETHASONE SODIUM PHOSPHATE 10 MG/ML IJ SOLN
INTRAMUSCULAR | Status: DC | PRN
  Administered 2021-07-12: 10 mg via INTRAVENOUS

## 2021-07-12 MED ORDER — PHENYLEPHRINE 40 MCG/ML (10ML) SYRINGE FOR IV PUSH (FOR BLOOD PRESSURE SUPPORT)
PREFILLED_SYRINGE | INTRAVENOUS | Status: AC
  Filled 2021-07-12: qty 10

## 2021-07-12 MED ORDER — FENTANYL CITRATE (PF) 100 MCG/2ML IJ SOLN
INTRAMUSCULAR | Status: AC
  Filled 2021-07-12: qty 2

## 2021-07-12 MED ORDER — SUGAMMADEX SODIUM 200 MG/2ML IV SOLN
INTRAVENOUS | Status: DC | PRN
Start: 2021-07-12 — End: 2021-07-12
  Administered 2021-07-12: 200 mg via INTRAVENOUS

## 2021-07-12 MED ORDER — PHENYLEPHRINE HCL-NACL 20-0.9 MG/250ML-% IV SOLN: INTRAVENOUS | Status: DC | PRN

## 2021-07-12 MED ORDER — LIDOCAINE HCL (CARDIAC) PF 100 MG/5ML IV SOSY
PREFILLED_SYRINGE | INTRAVENOUS | Status: DC | PRN
  Administered 2021-07-12: 100 mg via INTRAVENOUS

## 2021-07-12 MED ORDER — BUPIVACAINE-EPINEPHRINE (PF) 0.25% -1:200000 IJ SOLN
INTRAMUSCULAR | Status: AC
  Filled 2021-07-12: qty 30

## 2021-07-12 MED ORDER — FENTANYL CITRATE (PF) 100 MCG/2ML IJ SOLN
INTRAMUSCULAR | Status: DC | PRN
  Administered 2021-07-12: 50 ug via INTRAVENOUS

## 2021-07-12 MED ORDER — ALBUMIN HUMAN 5 % IV SOLN: INTRAVENOUS | Status: DC | PRN

## 2021-07-12 MED ORDER — MIDAZOLAM HCL 2 MG/2ML IJ SOLN
INTRAMUSCULAR | Status: AC
  Filled 2021-07-12: qty 2

## 2021-07-12 MED ORDER — SODIUM CHLORIDE 0.9 % IV SOLN: INTRAVENOUS | Status: DC

## 2021-07-12 MED ORDER — KETOROLAC TROMETHAMINE 30 MG/ML IJ SOLN
30.0000 mg | Freq: Four times a day (QID) | INTRAMUSCULAR | Status: DC
  Administered 2021-07-12 – 2021-07-15 (×9): 30 mg via INTRAVENOUS
  Filled 2021-07-12 (×9): qty 1

## 2021-07-12 MED ORDER — OXYCODONE HCL 5 MG PO TABS: 5.0000 mg | ORAL_TABLET | Freq: Once | ORAL | Status: DC | PRN

## 2021-07-12 MED ORDER — ONDANSETRON HCL 4 MG/2ML IJ SOLN
INTRAMUSCULAR | Status: DC | PRN
  Administered 2021-07-12: 4 mg via INTRAVENOUS

## 2021-07-12 MED ORDER — STERILE WATER FOR IRRIGATION IR SOLN
Status: DC | PRN
  Administered 2021-07-12: 1000 mL

## 2021-07-12 MED ORDER — 0.9 % SODIUM CHLORIDE (POUR BTL) OPTIME
TOPICAL | Status: DC | PRN
  Administered 2021-07-12: 1000 mL

## 2021-07-12 MED ORDER — MIDAZOLAM HCL 2 MG/2ML IJ SOLN
INTRAMUSCULAR | Status: DC | PRN
  Administered 2021-07-12: 2 mg via INTRAVENOUS

## 2021-07-12 MED ORDER — FENTANYL CITRATE (PF) 100 MCG/2ML IJ SOLN
25.0000 ug | INTRAMUSCULAR | Status: DC | PRN
  Administered 2021-07-12: 50 ug via INTRAVENOUS
  Administered 2021-07-12: 25 ug via INTRAVENOUS

## 2021-07-12 MED ORDER — PHENYLEPHRINE HCL-NACL 20-0.9 MG/250ML-% IV SOLN
INTRAVENOUS | Status: DC | PRN
  Administered 2021-07-12: 50 ug/min via INTRAVENOUS

## 2021-07-12 MED ORDER — LACTATED RINGERS IV SOLN: INTRAVENOUS | Status: DC

## 2021-07-12 MED ORDER — ONDANSETRON HCL 4 MG/2ML IJ SOLN: 4.0000 mg | Freq: Once | INTRAMUSCULAR | Status: DC | PRN

## 2021-07-12 MED ORDER — PHENYLEPHRINE HCL-NACL 20-0.9 MG/250ML-% IV SOLN
INTRAVENOUS | Status: AC
  Filled 2021-07-12: qty 250

## 2021-07-12 MED ORDER — LACTATED RINGERS IV SOLN: INTRAVENOUS | Status: DC | PRN

## 2021-07-12 MED ORDER — PROPOFOL 10 MG/ML IV BOLUS
INTRAVENOUS | Status: DC | PRN
  Administered 2021-07-12: 200 mg via INTRAVENOUS

## 2021-07-12 MED ORDER — DEXAMETHASONE SODIUM PHOSPHATE 10 MG/ML IJ SOLN
INTRAMUSCULAR | Status: AC
  Filled 2021-07-12: qty 1

## 2021-07-12 MED ORDER — PHENYLEPHRINE HCL (PRESSORS) 10 MG/ML IV SOLN
INTRAVENOUS | Status: DC | PRN
  Administered 2021-07-12: 80 ug via INTRAVENOUS

## 2021-07-12 MED ORDER — SODIUM CHLORIDE (PF) 0.9 % IJ SOLN
INTRAMUSCULAR | Status: DC | PRN
  Administered 2021-07-12: 100 mL

## 2021-07-12 MED ORDER — OXYCODONE HCL 5 MG/5ML PO SOLN: 5.0000 mg | Freq: Once | ORAL | Status: DC | PRN

## 2021-07-12 MED ORDER — SODIUM CHLORIDE (PF) 0.9 % IJ SOLN
INTRAMUSCULAR | Status: AC
  Filled 2021-07-12: qty 50

## 2021-07-12 MED ORDER — ACETAMINOPHEN 500 MG PO TABS
1000.0000 mg | ORAL_TABLET | Freq: Four times a day (QID) | ORAL | Status: DC
  Administered 2021-07-13 – 2021-07-15 (×8): 1000 mg via ORAL
  Filled 2021-07-12 (×8): qty 2

## 2021-07-12 MED ORDER — FENTANYL CITRATE (PF) 100 MCG/2ML IJ SOLN
INTRAMUSCULAR | Status: AC
  Administered 2021-07-12: 25 ug via INTRAVENOUS
  Filled 2021-07-12: qty 2

## 2021-07-12 MED ORDER — ACETAMINOPHEN 10 MG/ML IV SOLN: 1000.0000 mg | Freq: Once | INTRAVENOUS | Status: DC | PRN

## 2021-07-12 MED ORDER — ROCURONIUM BROMIDE 100 MG/10ML IV SOLN
INTRAVENOUS | Status: DC | PRN
  Administered 2021-07-12: 40 mg via INTRAVENOUS
  Administered 2021-07-12: 30 mg via INTRAVENOUS
  Administered 2021-07-12: 50 mg via INTRAVENOUS
  Administered 2021-07-12: 10 mg via INTRAVENOUS

## 2021-07-12 MED ORDER — MORPHINE SULFATE (PF) 2 MG/ML IV SOLN
2.0000 mg | INTRAVENOUS | Status: DC | PRN
  Administered 2021-07-13: 2 mg via INTRAVENOUS
  Filled 2021-07-12: qty 1

## 2021-07-12 MED ORDER — ALBUMIN HUMAN 5 % IV SOLN
INTRAVENOUS | Status: AC
  Filled 2021-07-12: qty 250

## 2021-07-12 MED ORDER — SODIUM CHLORIDE 0.9 % IV SOLN: 2.0000 g | INTRAVENOUS | Status: DC

## 2021-07-12 MED ORDER — OXYCODONE HCL 5 MG PO TABS: 5.0000 mg | ORAL_TABLET | ORAL | Status: DC | PRN

## 2021-07-12 MED ORDER — BUPIVACAINE LIPOSOME 1.3 % IJ SUSP
INTRAMUSCULAR | Status: AC
  Filled 2021-07-12: qty 20

## 2021-07-12 MED ORDER — ONDANSETRON HCL 4 MG/2ML IJ SOLN
INTRAMUSCULAR | Status: AC
  Filled 2021-07-12: qty 2

## 2021-07-12 SURGICAL SUPPLY — 76 items
APPLIER CLIP 13 LRG OPEN (CLIP) ×2
BAG DECANTER FOR FLEXI CONT (MISCELLANEOUS) ×2 IMPLANT
BLADE CLIPPER SURG (BLADE) ×2 IMPLANT
BLADE SURG SZ10 CARB STEEL (BLADE) ×2 IMPLANT
BLADE SURG SZ11 CARB STEEL (BLADE) ×2 IMPLANT
BULB RESERV EVAC DRAIN JP 100C (MISCELLANEOUS) ×1 IMPLANT
CATH ROBINSON RED 14FR (CATHETERS) ×1 IMPLANT
CATH URET ROBINSON RED 14FR (CATHETERS)
CLIP APPLIE 13 LRG OPEN (CLIP) ×1 IMPLANT
DERMABOND ADVANCED (GAUZE/BANDAGES/DRESSINGS) ×1
DERMABOND ADVANCED .7 DNX12 (GAUZE/BANDAGES/DRESSINGS) ×2 IMPLANT
DRAPE INCISE IOBAN 66X45 STRL (DRAPES) ×2 IMPLANT
DRAPE LEGGINS SURG 28X43 STRL (DRAPES) ×2 IMPLANT
DRAPE UNDER BUTTOCK W/FLU (DRAPES) ×2 IMPLANT
DRSG OPSITE POSTOP 3X4 (GAUZE/BANDAGES/DRESSINGS) ×2 IMPLANT
DRSG OPSITE POSTOP 4X8 (GAUZE/BANDAGES/DRESSINGS) ×1 IMPLANT
ELECT BLADE 6.5 EXT (BLADE) ×2 IMPLANT
ELECT CAUTERY BLADE 6.4 (BLADE) ×2 IMPLANT
ELECT REM PT RETURN 9FT ADLT (ELECTROSURGICAL) ×2
ELECTRODE REM PT RTRN 9FT ADLT (ELECTROSURGICAL) ×1 IMPLANT
GAUZE SPONGE 4X4 12PLY STRL (GAUZE/BANDAGES/DRESSINGS) ×2 IMPLANT
GLOVE SURG ENC MOIS LTX SZ7 (GLOVE) ×4 IMPLANT
GOWN STRL REUS W/ TWL LRG LVL3 (GOWN DISPOSABLE) ×4 IMPLANT
GOWN STRL REUS W/TWL LRG LVL3 (GOWN DISPOSABLE) ×8
HANDLE SUCTION POOLE (INSTRUMENTS) ×1 IMPLANT
HANDLE YANKAUER SUCT BULB TIP (MISCELLANEOUS) ×2 IMPLANT
IRRIGATION STRYKERFLOW (MISCELLANEOUS) ×1 IMPLANT
IRRIGATOR STRYKERFLOW (MISCELLANEOUS) ×2
IV NS 1000ML (IV SOLUTION) ×2
IV NS 1000ML BAXH (IV SOLUTION) ×1 IMPLANT
JACKSON PRATT 10 (INSTRUMENTS) ×2 IMPLANT
L-HOOK LAP DISP 36CM (ELECTROSURGICAL)
LHOOK LAP DISP 36CM (ELECTROSURGICAL) ×1 IMPLANT
MANIFOLD NEPTUNE II (INSTRUMENTS) ×2 IMPLANT
MARKER SKIN DUAL TIP RULER LAB (MISCELLANEOUS) ×2 IMPLANT
NEEDLE HYPO 22GX1.5 SAFETY (NEEDLE) ×2 IMPLANT
NS IRRIG 1000ML POUR BTL (IV SOLUTION) ×2 IMPLANT
PACK COLON CLEAN CLOSURE (MISCELLANEOUS) ×2 IMPLANT
PACK LAP CHOLECYSTECTOMY (MISCELLANEOUS) ×2 IMPLANT
PENCIL ELECTRO HAND CTR (MISCELLANEOUS) ×2 IMPLANT
RELOAD PROXIMATE 75MM BLUE (ENDOMECHANICALS) ×10 IMPLANT
RELOAD STAPLE 60 3.6 BLU REG (STAPLE) ×4 IMPLANT
RELOAD STAPLE 75 3.8 BLU REG (ENDOMECHANICALS) IMPLANT
RELOAD STAPLER BLUE 60MM (STAPLE) IMPLANT
SCISSORS METZENBAUM CVD 33 (INSTRUMENTS) ×2 IMPLANT
SET TUBE SMOKE EVAC HIGH FLOW (TUBING) ×2 IMPLANT
SHEARS HARMONIC ACE PLUS 36CM (ENDOMECHANICALS) ×2 IMPLANT
SLEEVE ENDOPATH XCEL 5M (ENDOMECHANICALS) ×2 IMPLANT
SPONGE T-LAP 18X18 ~~LOC~~+RFID (SPONGE) ×7 IMPLANT
STAPLE ECHEON FLEX 60 POW ENDO (STAPLE) IMPLANT
STAPLER CIRCULAR MANUAL XL 25 (STAPLE) IMPLANT
STAPLER CIRCULAR MANUAL XL 29 (STAPLE) IMPLANT
STAPLER CIRCULAR MANUAL XL 33 (STAPLE) IMPLANT
STAPLER PROXIMATE 75MM BLUE (STAPLE) ×1 IMPLANT
STAPLER RELOAD BLUE 60MM (STAPLE)
STAPLER SKIN PROX 35W (STAPLE) ×2 IMPLANT
SUCT SIGMOIDOSCOPE TIP 18 W/TU (SUCTIONS) ×1 IMPLANT
SUCTION POOLE HANDLE (INSTRUMENTS) ×2
SUT MNCRL AB 4-0 PS2 18 (SUTURE) ×4 IMPLANT
SUT PDS AB 0 CT1 27 (SUTURE) ×4 IMPLANT
SUT SILK 2 0 (SUTURE) ×4
SUT SILK 2 0SH CR/8 30 (SUTURE) ×2 IMPLANT
SUT SILK 2-0 (SUTURE) ×2 IMPLANT
SUT SILK 2-0 18XBRD TIE 12 (SUTURE) ×1 IMPLANT
SUT SILK 3-0 (SUTURE) ×2 IMPLANT
SUT VIC AB 2-0 SH 27 (SUTURE) ×4
SUT VIC AB 2-0 SH 27XBRD (SUTURE) ×2 IMPLANT
SYR 20ML LL LF (SYRINGE) ×4 IMPLANT
SYR 50ML LL SCALE MARK (SYRINGE) ×2 IMPLANT
SYS LAPSCP GELPORT 120MM (MISCELLANEOUS) ×2
SYSTEM LAPSCP GELPORT 120MM (MISCELLANEOUS) ×1 IMPLANT
TOWEL OR 17X26 4PK STRL BLUE (TOWEL DISPOSABLE) ×2 IMPLANT
TRAY FOLEY MTR SLVR 16FR STAT (SET/KITS/TRAYS/PACK) ×2 IMPLANT
TROCAR XCEL 12X100 BLDLESS (ENDOMECHANICALS) ×2 IMPLANT
TROCAR XCEL NON-BLD 5MMX100MML (ENDOMECHANICALS) ×2 IMPLANT
WATER STERILE IRR 500ML POUR (IV SOLUTION) ×1 IMPLANT

## 2021-07-12 NOTE — Progress Notes (Signed)
[facility] Progress Note ? ?Patient: Connor Wilkinson    ZJQ:[id number]  DOA: 07/05/2021    ?Date of Service: the patient was seen and examined on 07/12/2021 ? ?Brief hospital course: ?36 year old male with past medical history of tobacco abuse and bipolar disorder plus PTSD from a group home was receiving work-up for fatigue and found to have low hemoglobin and sent to the emergency room on 3/21.  Lab work revealed hemoglobin of 4.0 with repeat of 4.5 and MCV of 63, with further work-up noting iron deficiency anemia.  Patient admitted to the hospitalist service and transfused 2 units of packed red blood cells.  Patient seen by GI with plans for EGD and colonoscopy on 3/22. ? ?Prior to colonoscopy, hemoglobin on morning of 3/22 at 6.9 and as per anesthesia request, additional unit of packed red blood cells transfused.  EGD unremarkable.  Gastroenterology unable to do colonoscopy due to poor prep with still moderate amount of stool present.  Colonoscopy done 3/23 and found to have partially obstructing mass in transverse colon.  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  Oncology and general surgery consulted.  General surgery plans to take patient for mass removal and partial colectomy today. ? ?Assessment and Plan: ?Assessment and Plan: ?* Adenocarcinoma of colon ([place]) ?This is the cause of patient's iron deficiency anemia.  May have been going on for some time now.  Appreciate GI help.  Confirmed with pathology.  CEA level pending.  Oncology following.  CT of chest abdomen pelvis notes no evidence of metastatic disease.  Possible some adjacent lymph node spread.  General surgery plans to take patient for mass removal and partial colectomy today. ? ?Iron deficiency anemia ?Status post 4 units total packed red blood cells.  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  Underwent iron transfusion on 3/23.  Hemoglobin 8.6. Overall, anemia likely should improve once mass is resected.  IV iron and blood transfusions as needed  before surgery to get hemoglobin > 8.5. ? ?Nicotine dependence ?Smoking cessation has been discussed with patient in detail ?We will start him on nicotine gum 2 mg as needed ? ?Bipolar 1 disorder ([place]) ?Stable ?Continue Abilify, Cogentin, fluoxetine and buspirone.  Patient is a ward of the state and decisions to be made through his guardian. ? ? ? ? ? ? ?Body mass index is 23.74 kg/m?.  ?  ?   ? ?Consultants: ?Gastroenterology ?General surgery ?Medical oncology ? ?Procedures: ?Endoscopy 3/22: Unremarkable ?Colonoscopy 3/23: Near occluding mass in the transverse colon ?Transfusion 4 units to date, packed red blood cells ?Surgery 3/28: Mass removal and partial colectomy ? ?Antimicrobials: ?Preop Ancef ? ?Code Status: Full code ? ? ?Subjective: Patient doing okay, no complaints.  Slightly nervous for surgery ? ?Objective: ?Vital signs were reviewed and unremarkable. ?Vitals:  ? 07/12/21 0731 07/12/21 1435  ?BP: (!) 111/59 124/69  ?Pulse: 64 (!) 53  ?Resp: 16 16  ?Temp: 98.4 ?F (36.9 ?C) 98.1 ?F (36.7 ?C)  ?SpO2: 100% 100%  ? ? ?Intake/Output Summary (Last 24 hours) at 07/12/2021 1756 ?Last data filed at 07/12/2021 1655 ?Gross per 24 hour  ?Intake 1830 ml  ?Output 1 ml  ?Net 1829 ml  ? ? ?Filed Weights  ? 07/05/21 1006 07/06/21 1100 07/12/21 1435  ?Weight: 79.4 kg 79.4 kg 79.4 kg  ? ?Body mass index is 23.74 kg/m?. ? ?Exam: ? ?General: Alert and oriented x3, no acute distress ?HEENT: Normocephalic, atraumatic, mucous membranes are moist ?Cardiovascular: Regular rate and rhythm, S1-S2 ?Respiratory: Clear to auscultation bilaterally ?  Abdomen: Soft, nontender,, positive bowel sounds ?Musculoskeletal: No clubbing cyanosis or edema ?Skin: No skin breaks, tears or lesions ?Psychiatry: Appropriate, no evidence of psychoses ?Neurology: No focal deficits ? ?Data Reviewed: ?Hemoglobin today at 8.6 ? ?Disposition:  ?Status is: Inpatient ?Patient is inpatient status and needs to continue as he is receiving acute care and services  ?   ? ?Anticipated discharge date: 3/31 ? ?Remaining issues to be resolved so that patient can be discharged: Plans for surgery tomorrow followed by clearance by surgery and plan for by oncology ? ? ?Family Communication: Updated legal guardian by phone ?DVT Prophylaxis: ?SCDs Start: 07/05/21 1313 ? ? ? ?Author: ?Annita Brod ,MD ?07/12/2021 5:56 PM ? ?To reach On-call, see care teams to locate the attending and reach out via [email or web address]. ?Between 7PM-7AM, please contact night-coverage ?If you still have difficulty reaching the attending provider, please page the [facility] (Director on Call) for [facility] on amion for assistance. ? ?

## 2021-07-12 NOTE — Transfer of Care (Addendum)
Immediate Anesthesia Transfer of Care Note ? ?Patient: Connor Wilkinson ? ?Procedure(s) Performed: HAND ASSISTED LAPAROSCOPIC COLON RESECTION (Abdomen) ? ?Patient Location: PACU ? ?Anesthesia Type:General ? ?Level of Consciousness: drowsy ? ?Airway & Oxygen Therapy: Patient Spontanous Breathing and Patient connected to face mask oxygen ? ?Post-op Assessment: Report given to RN and Post -op Vital signs reviewed and stable ? ?Post vital signs: stable ? ?Last Vitals:  ?Vitals Value Taken Time  ?BP    ?Temp    ?Pulse    ?Resp    ?SpO2    ? ? ?Last Pain:  ?Vitals:  ? 07/12/21 1435  ?TempSrc: Core (Comment)  ?PainSc: 0-No pain  ?   ? ?Patients Stated Pain Goal: 0 (07/12/21 1435) ? ?Complications: No notable events documented. ?

## 2021-07-12 NOTE — Anesthesia Procedure Notes (Signed)
Procedure Name: Intubation ?Date/Time: 07/12/2021 4:06 PM ?Performed by: Cammie Sickle, CRNA ?Pre-anesthesia Checklist: Patient identified, Patient being monitored, Timeout performed, Emergency Drugs available and Suction available ?Patient Re-evaluated:Patient Re-evaluated prior to induction ?Oxygen Delivery Method: Circle system utilized ?Preoxygenation: Pre-oxygenation with 100% oxygen ?Induction Type: IV induction ?Ventilation: Mask ventilation without difficulty ?Laryngoscope Size: 3 and McGraph ?Grade View: Grade I ?Tube type: Oral ?Tube size: 7.5 mm ?Number of attempts: 1 ?Airway Equipment and Method: Stylet ?Placement Confirmation: ETT inserted through vocal cords under direct vision, positive ETCO2 and breath sounds checked- equal and bilateral ?Secured at: 22 cm ?Tube secured with: Tape ?Dental Injury: Teeth and Oropharynx as per pre-operative assessment  ? ? ? ? ?

## 2021-07-12 NOTE — Anesthesia Postprocedure Evaluation (Signed)
Anesthesia Post Note ? ?Patient: Connor Wilkinson ? ?Procedure(s) Performed: HAND ASSISTED LAPAROSCOPIC COLON RESECTION (Abdomen) ? ?Patient location during evaluation: PACU ?Anesthesia Type: General ?Level of consciousness: awake and alert ?Pain management: pain level controlled ?Vital Signs Assessment: post-procedure vital signs reviewed and stable ?Respiratory status: spontaneous breathing, nonlabored ventilation, respiratory function stable and patient connected to nasal cannula oxygen ?Cardiovascular status: blood pressure returned to baseline and stable ?Postop Assessment: no apparent nausea or vomiting ?Anesthetic complications: no ? ? ?No notable events documented. ? ? ?Last Vitals:  ?Vitals:  ? 07/12/21 2000 07/12/21 2020  ?BP: 130/74 124/73  ?Pulse: 82 85  ?Resp: 16 16  ?Temp: (!) 36.2 ?C 36.7 ?C  ?SpO2: 100% 100%  ?  ?Last Pain:  ?Vitals:  ? 07/12/21 2020  ?TempSrc: Oral  ?PainSc:   ? ? ?  ?  ?  ?  ?  ?  ? ?Martha Clan ? ? ? ? ?

## 2021-07-12 NOTE — Op Note (Signed)
PROCEDURES: ?1. Hand assisted Laparoscopic Right Extended Hemicolectomy ?With stapled ileocolostomy ? ?Pre-operative Diagnosis: Transverse  Colon CA ? ?Post-operative Diagnosis: Same ? ?Surgeon: Marjory Lies Omayra Tulloch  ? ?Anesthesia: General endotracheal anesthesia ? ?ASA Class: 2 ? ?Surgeon: Caroleen Hamman , MD FACS ? ?Anesthesia: Gen. with endotracheal tube ? ? ?Findings: ?Large Right colon mass with bulky nodal mesentery disease, no evidence of distant metastasis ?Tension free anastomosis, no evidence of intraop leak and good perfusion ? ?Estimated Blood Loss: 100cc ?        ?Drains: none ?        ?Specimens: Right colon    ?      ?Complications: none ?        ? ?Procedure Details  ?The patient was seen again in the Holding Room. The benefits, complications, treatment options, and expected outcomes were discussed with the patient. The risks of bleeding, infection, recurrence of symptoms, failure to resolve symptoms,  bowel injury, any of which could require further surgery were reviewed with the patient.   The patient was taken to Operating Room, identified as Wetzel [place]ornstad and the procedure verified.  A Time Out was held and the above information confirmed. ? ?Prior to the induction of general anesthesia, antibiotic prophylaxis was administered. VTE prophylaxis was in place. General endotracheal anesthesia was then administered and tolerated well. After the induction, the abdomen was prepped with Chloraprep and draped in the sterile fashion. The patient was positioned in the supine position. ? ?7 cm incision was created as a midline mini laparotomy. The abdominal cavity was entered under direct visualization and the GelPort device was placed. two 5 mm ports were placed  under direct visualization and pneumoperitoneum was obtained.   NO hemodynamic changes were observed. ?The greater omentum was divided and the hepatic flexure was taken down using harmonic scalpel.  The white line of Toldt was incised and a lateral to medial  dissection was performed.  We identified the right ureter as well as the duodenum and preserve both structures at all times. ?We Were also able to mobilize the attachments of the cecum and terminal ileum.  Once we had an adequate mobilization were able to remove the GelPort and exteriorized the right colon.  A 10 cm margin on the terminal ileum was identified and we created a window with electrocautery and divided the terminal ileum.  Attention then was turned to the distal excision margin.  We identified the middle colic artery on selected a spot Left to the middle colic artery with greater than 8 cms from mass. We  Were able to also use a 75 GIA stapler to divide this area.  The mesentery was scored with electrocautery.  We identified the right colic artery and suture ligated with 2 0 silks in the standard fashion.  The rest of the mesentery was divided using the harmonic scalpel.  Please note that we went as low as possible to the base of the mesentery to obtain adequate lymph nodes and adequate margins of dissection. ?Specimen was passed and sent to permanent pathology.  A standard side-to-side functional end to end staple anastomosis was created with multiple loads of a 75 GIA stapler device.  We check for patency as well as leak.  There was a tension-free anastomosis with good perfusion and no evidence of intraoperative leak.  The mesenteric defect was closed with a running 2-0 Vicryl in the standard fashion. ?We changed gloves and place a clean closure tray.   ?Liposomal Marcaine was injected  throughout the abdominal wall on both sides under direct visualization and palpation.  The fascia was closed with a running 0 PDS using the small bite techniques.  Incisions were closed with Staples.  .  Needle and laparotomy counts were correct and there were no immediate complications ? ? ? ? ?Caroleen Hamman, MD, FACS ?  ?

## 2021-07-12 NOTE — Progress Notes (Signed)
This RN reviewed check in information with patient's legal guardian [name] via phone call [phone]) requested documentation of guardianship to be sent; sending via e-mail to assistant director's e-mail address to be added to patient's chart.  ?

## 2021-07-12 NOTE — Progress Notes (Signed)
[name] ?SURGICAL PROGRESS NOTE ? ?Hospital Day(s): 6.  ? ?Post op day(s): 5 Days Post-Op.  ? ?Interval History:  ?Patient seen and examined ?No acute events or new complaints overnight.  ?Patient reports he is doing well; expectedly nervous ?No abdominal pain, nausea ?Hgb improved to 8.6 this morning s/p transfusion 1 unit pRBCs yesterday ?He tolerated bowel prep ?Plan for hand assisted laparoscopic right colectomy this afternoon  ? ?Vital signs in last 24 hours: [min-max] current  ?Temp:  [97.4 ?F (36.3 ?C)-98.6 ?F (37 ?C)] 98.2 ?F (36.8 ?C) (03/28 0356) ?Pulse Rate:  [68-93] 74 (03/28 0356) ?Resp:  [15-16] 16 (03/28 0356) ?BP: (116-130)/(69-83) 119/69 (03/28 0356) ?SpO2:  [99 %-100 %] 99 % (03/28 0356)     Height: 6' (182.9 cm) Weight: 79.4 kg BMI (Calculated): 23.73  ? ?Intake/Output last 2 shifts:  ?03/27 0701 - 03/28 0700 ?In: 2080 [P.O.:1720; Blood:360] ?Out: -   ? ?Physical Exam:  ?Constitutional: alert, cooperative and no distress  ?HENT: normocephalic without obvious abnormality  ?Eyes: PERRL, EOM's grossly intact and symmetric  ?Respiratory: breathing non-labored at rest  ?Cardiovascular: regular rate and sinus rhythm  ?Gastrointestinal: soft, non-tender, and non-distended ?Musculoskeletal: no edema or wounds, motor and sensation grossly intact, NT  ? ?Labs:  ? ?  Latest Ref Rng & Units 07/12/2021  ?  6:01 AM 07/11/2021  ?  6:00 AM 07/10/2021  ?  5:53 AM  ?CBC  ?WBC 4.0 - 10.5 K/uL 7.2   10.2   9.7    ?Hemoglobin 13.0 - 17.0 g/dL 8.6   7.9   8.1    ?Hematocrit 39.0 - 52.0 % 31.1   29.0   29.2    ?Platelets 150 - 400 K/uL 439   475   481    ? ? ?  Latest Ref Rng & Units 07/08/2021  ?  9:58 AM 07/07/2021  ?  4:46 AM 07/06/2021  ?  4:44 AM  ?CMP  ?Glucose 70 - 99 mg/dL 85   89   90    ?BUN 6 - 20 mg/dL '12   9   12    '$ ?Creatinine 0.61 - 1.24 mg/dL 0.79   0.95   0.88    ?Sodium 135 - 145 mmol/L 138   141   138    ?Potassium 3.5 - 5.1 mmol/L 4.3   4.3   4.2    ?Chloride 98 - 111 mmol/L 105   110    107    ?CO2 22 - 32 mmol/L '26   26   27    '$ ?Calcium 8.9 - 10.3 mg/dL 8.6   9.0   8.8    ?Total Protein 6.5 - 8.1 g/dL 6.1      ?Total Bilirubin 0.3 - 1.2 mg/dL 0.5      ?Alkaline Phos 38 - 126 U/L 64      ?AST 15 - 41 U/L 17      ?ALT 0 - 44 U/L 9      ? ? ? ?Imaging studies: No new pertinent imaging studies ? ? ?Assessment/Plan: (ICD-10's: C18.9) ?36 y.o. 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 ?  ?           - NPO this morning  ?           - Plan for hand assisted laparoscopic right colectomy today with Dr Dahlia Byes pending OR/Anesthesia availability.  ?           -  Dr Dahlia Byes has discussed all risks, benefits, and alternatives to above procedure(s) were discussed with the patient's legal guardian Connor Wilkinson), all of their questions were answered to their expressed satisfaction, patient's legal guarding (Connor Wilkinson) expresses wish to proceed, and informed consent was obtained.   ?           - Monitor abdominal examination; on-going bowel function ?- Pain control prn; antiemetics prn ?- Monitor H&H; stable  ?           - Mobilization as tolerated ?- Further management per primary service; we will follow   ?  ?All of the above findings and recommendations were discussed with the patient, and the medical team, and all of patient's questions were answered to his expressed satisfaction. ? ?-- ?Connor Simon, PA-C ?[facility] ?07/12/2021, 7:10 AM ?[phone] ?M-F: 7am - 4pm ? ?

## 2021-07-12 NOTE — Anesthesia Preprocedure Evaluation (Addendum)
Anesthesia Evaluation  ?Patient identified by MRN, date of birth, ID band ?Patient awake ? ? ? ?Reviewed: ?Allergy & Precautions, NPO status , Patient's Chart, lab work & pertinent test results ? ?History of Anesthesia Complications ?Negative for: history of anesthetic complications ? ?Airway ?Mallampati: IV ? ? ?Neck ROM: Full ? ? ? Dental ?no notable dental hx. ? ?  ?Pulmonary ?Current Smoker and Patient abstained from smoking.,  ?  ?Pulmonary exam normal ?breath sounds clear to auscultation ? ? ? ? ? ? Cardiovascular ?Exercise Tolerance: Good ?negative cardio ROS ?Normal cardiovascular exam ?Rhythm:Regular Rate:Normal ? ? ?  ?Neuro/Psych ?Seizures - (none in several years),  PSYCHIATRIC DISORDERS (PTSD) Bipolar Disorder   ? GI/Hepatic ?Colon adenocarcinoma ?  ?Endo/Other  ?negative endocrine ROS ? Renal/GU ?negative Renal ROS  ? ?  ?Musculoskeletal ? ? Abdominal ?  ?Peds ? Hematology ? ?(+) Blood dyscrasia, anemia ,   ?Anesthesia Other Findings ? ? Reproductive/Obstetrics ? ?  ? ? ? ? ? ? ? ? ? ? ? ? ? ?  ?  ? ? ? ? ? ? ? ? ?Anesthesia Physical ? ?Anesthesia Plan ? ?ASA: 3 ? ?Anesthesia Plan: General  ? ?Post-op Pain Management: Ofirmev IV (intra-op)* and Dilaudid IV  ? ?Induction: Intravenous ? ?PONV Risk Score and Plan: 3 and Treatment may vary due to age or medical condition, Ondansetron, Dexamethasone and Midazolam ? ?Airway Management Planned: Oral ETT ? ?Additional Equipment: None ? ?Intra-op Plan:  ? ?Post-operative Plan: Extubation in OR ? ?Informed Consent: I have reviewed the patients History and Physical, chart, labs and discussed the procedure including the risks, benefits and alternatives for the proposed anesthesia with the patient or authorized representative who has indicated his/her understanding and acceptance.  ? ? ? ?Dental advisory given and Consent reviewed with POA ? ?Plan Discussed with: CRNA ? ?Anesthesia Plan Comments: (Discussed risks of anesthesia with  patient's legal guardian [name] Joe via phone, including PONV, sore throat, lip/dental/eye damage. Rare risks discussed as well, such as cardiorespiratory and neurological sequelae, and allergic reactions. Discussed the role of CRNA in patient's perioperative care. She understands. Also discussed all of the above with the patient himself, who understood.)  ? ? ? ? ? ?Anesthesia Quick Evaluation ? ?

## 2021-07-13 ENCOUNTER — Encounter: Payer: Self-pay | Admitting: Surgery

## 2021-07-13 DIAGNOSIS — C189 Malignant neoplasm of colon, unspecified: Secondary | ICD-10-CM | POA: Diagnosis not present

## 2021-07-13 LAB — CBC
HCT: 30.5 % — ABNORMAL LOW (ref 39.0–52.0)
Hemoglobin: 8.6 g/dL — ABNORMAL LOW (ref 13.0–17.0)
MCH: 21.5 pg — ABNORMAL LOW (ref 26.0–34.0)
MCHC: 28.2 g/dL — ABNORMAL LOW (ref 30.0–36.0)
MCV: 76.3 fL — ABNORMAL LOW (ref 80.0–100.0)
Platelets: 433 10*3/uL — ABNORMAL HIGH (ref 150–400)
RBC: 4 MIL/uL — ABNORMAL LOW (ref 4.22–5.81)
RDW: 29.5 % — ABNORMAL HIGH (ref 11.5–15.5)
WBC: 12.4 10*3/uL — ABNORMAL HIGH (ref 4.0–10.5)
nRBC: 0 % (ref 0.0–0.2)

## 2021-07-13 MED ORDER — POLYETHYLENE GLYCOL 3350 17 G PO PACK
17.0000 g | PACK | Freq: Every day | ORAL | Status: DC
Start: 2021-07-13 — End: 2021-07-15
  Administered 2021-07-13 – 2021-07-15 (×3): 17 g via ORAL
  Filled 2021-07-13 (×2): qty 1

## 2021-07-13 MED ORDER — ENOXAPARIN SODIUM 40 MG/0.4ML IJ SOSY
40.0000 mg | PREFILLED_SYRINGE | INTRAMUSCULAR | Status: DC
  Administered 2021-07-13 – 2021-07-14 (×2): 40 mg via SUBCUTANEOUS
  Filled 2021-07-13 (×2): qty 0.4

## 2021-07-13 NOTE — Progress Notes (Signed)
[facility] Progress Note ? ?Patient: Connor Wilkinson    VPX:[id number]  DOA: 07/05/2021    ?Date of Service: the patient was seen and examined on 07/13/2021 ? ?Brief hospital course: ?36 year old male with past medical history of tobacco abuse and bipolar disorder plus PTSD from a group home was receiving work-up for fatigue and found to have low hemoglobin and sent to the emergency room on 3/21.  Lab work revealed hemoglobin of 4.0 with repeat of 4.5 and MCV of 63, with further work-up noting iron deficiency anemia.  Patient admitted to the hospitalist service and transfused 2 units of packed red blood cells.  Patient seen by GI with plans for EGD and colonoscopy on 3/22. ?  ?Prior to colonoscopy, hemoglobin on morning of 3/22 at 6.9 and as per anesthesia request, additional unit of packed red blood cells transfused.  EGD unremarkable.  Gastroenterology unable to do colonoscopy due to poor prep with still moderate amount of stool present.  Colonoscopy done 3/23 and found to have partially obstructing mass in transverse colon.  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  Oncology and general surgery consulted.  hemicolectomy performed 3/28. ? ?Assessment and Plan: ?Assessment and Plan: ?* Adenocarcinoma of colon ([place]) ?This is the cause of patient's iron deficiency anemia.  May have been going on for some time now.  Appreciate GI help.  Confirmed with pathology.  CEA level pending.  Oncology following.  CT of chest abdomen pelvis notes no evidence of metastatic disease; possible some adjacent lymph node spread.  POD1 from hemicolectomy with ileocolostomy. On clear liquid diet. Pain controlled. PT consulted. ? ?Iron deficiency anemia ?Status post 4 units total packed red blood cells.  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  Underwent iron transfusion on 3/23.  Hemoglobin 8.6. Overall, anemia likely should improve once mass is resected.  IV iron and blood transfusions as needed before surgery to get hemoglobin >  8.5. ? ?Nicotine dependence ?Smoking cessation has been discussed with patient in detail ?We will start him on nicotine gum 2 mg as needed ? ?Bipolar 1 disorder ([place]) ?Stable ?Continue Abilify, Cogentin, fluoxetine and buspirone.  Patient is a ward of the state and decisions to be made through his guardian. ? ? ? ? ? ? ?Body mass index is 23.74 kg/m?.  ?  ?   ? ?Consultants: ?Gastroenterology ?General surgery ?Medical oncology ? ?Procedures: ?Endoscopy 3/22: Unremarkable ?Colonoscopy 3/23: Near occluding mass in the transverse colon ?Transfusion 4 units to date, packed red blood cells ?Surgery 3/28: Mass removal and partial colectomy ? ?Antimicrobials: ?Preop Ancef ? ?Code Status: Full code ? ? ?Subjective: Patient doing okay, no complaints.  Slightly nervous for surgery ? ?Objective: ?Vital signs were reviewed and unremarkable. ?Vitals:  ? 07/13/21 0519 07/13/21 0833  ?BP: 123/82 136/80  ?Pulse: 78 87  ?Resp: 18 18  ?Temp: 97.8 ?F (36.6 ?C) (!) 97.5 ?F (36.4 ?C)  ?SpO2: 100% 100%  ? ? ?Intake/Output Summary (Last 24 hours) at 07/13/2021 1124 ?Last data filed at 07/13/2021 0522 ?Gross per 24 hour  ?Intake 1624.64 ml  ?Output 1975 ml  ?Net -350.36 ml  ? ?Filed Weights  ? 07/05/21 1006 07/06/21 1100 07/12/21 1435  ?Weight: 79.4 kg 79.4 kg 79.4 kg  ? ?Body mass index is 23.74 kg/m?. ? ?Exam: ? ?General: Alert and oriented x3, no acute distress ?HEENT: Normocephalic, atraumatic, mucous membranes are moist ?Cardiovascular: Regular rate and rhythm, S1-S2 ?Respiratory: Clear to auscultation bilaterally ?Abdomen: Soft, nontender,, positive bowel sounds ?Musculoskeletal: No clubbing cyanosis or edema ?  Skin: No skin breaks, tears or lesions ?Psychiatry: Appropriate, no evidence of psychoses ?Neurology: No focal deficits ? ?Data Reviewed: ?Hemoglobin today at 8.6 ? ?Disposition:  ?Status is: Inpatient ?Patient is inpatient status and needs to continue as he is receiving acute care and services  ?  ? ?Anticipated discharge date:  3/31 ? ?Remaining issues to be resolved so that patient can be discharged: Plans for surgery tomorrow followed by clearance by surgery and plan for by oncology ? ? ?Family Communication: Henrene Hawking From [place] [facility] at [phone]  updated today ? ?DVT Prophylaxis: ?SCDs Start: 07/05/21 1313 ? ? ? ?Author: ?Desma Maxim ,MD ?07/13/2021 11:24 AM ? ?To reach On-call, see care teams to locate the attending and reach out via [email or web address]. ?Between 7PM-7AM, please contact night-coverage ?If you still have difficulty reaching the attending provider, please page the [facility] (Director on Call) for [facility] on amion for assistance. ? ?

## 2021-07-13 NOTE — Progress Notes (Signed)
[facility] ?SURGICAL PROGRESS NOTE ? ?Hospital Day(s): 7.  ? ?Post op day(s): 1 Day Post-Op.  ? ?Interval History:  ?Patient seen and examined ?No acute events or new complaints overnight.  ?Patient reports he is doing okay this morning; still significantly sore ?No fever, chills, nausea, emesis ?He does have a leukocytosis this morning to 12.4K; most likely reactive from OR ?Hgb stable at 8.6 ?UO - 1.8L ?He is on CLD ?He has not passed flatus ? ?Vital signs in last 24 hours: [min-max] current  ?Temp:  [97.2 ?F (36.2 ?C)-98.4 ?F (36.9 ?C)] 97.8 ?F (36.6 ?C) (03/29 0519) ?Pulse Rate:  [53-94] 78 (03/29 0519) ?Resp:  [16-20] 18 (03/29 0519) ?BP: (111-142)/(59-90) 123/82 (03/29 0519) ?SpO2:  [100 %] 100 % (03/29 0519) ?Weight:  [79.4 kg] 79.4 kg (03/28 1435)     Height: 6' (182.9 cm) Weight: 79.4 kg BMI (Calculated): 23.74  ? ?Intake/Output last 2 shifts:  ?03/28 0701 - 03/29 0700 ?In: 1624.6 [I.V.:1274.6; IV Piggyback:350] ?Out: 1975 [Urine:1825; Blood:150]  ? ?Physical Exam:  ?Constitutional: alert, cooperative and no distress  ?Respiratory: breathing non-labored at rest  ?Cardiovascular: regular rate and sinus rhythm  ?Gastrointestinal: Soft, incisional soreness expectedly, non-distended, no rebound/guarding ?Genitourinary: Foley in place ?Integumentary: Mini-laparotomy and laparoscopic incisions are CDI with staples and honeycomb, no erythema  ? ?Labs:  ? ?  Latest Ref Rng & Units 07/13/2021  ?  6:19 AM 07/12/2021  ?  6:01 AM 07/11/2021  ?  6:00 AM  ?CBC  ?WBC 4.0 - 10.5 K/uL 12.4   7.2   10.2    ?Hemoglobin 13.0 - 17.0 g/dL 8.6   8.6   7.9    ?Hematocrit 39.0 - 52.0 % 30.5   31.1   29.0    ?Platelets 150 - 400 K/uL 433   439   475    ? ? ?  Latest Ref Rng & Units 07/08/2021  ?  9:58 AM 07/07/2021  ?  4:46 AM 07/06/2021  ?  4:44 AM  ?CMP  ?Glucose 70 - 99 mg/dL 85   89   90    ?BUN 6 - 20 mg/dL '12   9   12    '$ ?Creatinine 0.61 - 1.24 mg/dL 0.79   0.95   0.88    ?Sodium 135 - 145 mmol/L 138   141   138     ?Potassium 3.5 - 5.1 mmol/L 4.3   4.3   4.2    ?Chloride 98 - 111 mmol/L 105   110   107    ?CO2 22 - 32 mmol/L '26   26   27    '$ ?Calcium 8.9 - 10.3 mg/dL 8.6   9.0   8.8    ?Total Protein 6.5 - 8.1 g/dL 6.1      ?Total Bilirubin 0.3 - 1.2 mg/dL 0.5      ?Alkaline Phos 38 - 126 U/L 64      ?AST 15 - 41 U/L 17      ?ALT 0 - 44 U/L 9      ? ? ?Imaging studies: No new pertinent imaging studies ? ? ?Assessment/Plan:  ?36 y.o. male 1 Day Post-Op s/p hand-assisted laparoscopic right extended hemicolectomy with ileocolostomy for transverse colon mass. ? ? - Would continue CLD for now; Once better ROBF can begin advancing diet  ? - Discontinue foley catheter today  ? - Complete perioperative Abx  ?- Continue IVF support  ?- Monitor abdominal examination; on-going bowel function ?-  Pain control prn; antiemetics prn ?- Mobilization encouraged; would engage PT tomorrow   ? ?All of the above findings and recommendations were discussed with the patient, and the medical team, and all of patient's questions were answered to his expressed satisfaction. ? ?-- ?Edison Simon, PA-C ?[facility] ?07/13/2021, 7:15 AM ?[phone] ?M-F: 7am - 4pm ? ?

## 2021-07-13 NOTE — Evaluation (Signed)
Physical Therapy Evaluation ?Patient Details ?Name: Connor Wilkinson ?MRN: [id number] ?DOB: 07-29-1985 ?Today's Date: 07/13/2021 ? ?History of Present Illness ? 36 year old male with past medical history of tobacco abuse and bipolar disorder plus PTSD from a group home was receiving work-up for fatigue and found to have low hemoglobin and sent to the emergency room on 3/21.  Lab work revealed hemoglobin of 4.0 with repeat of 4.5 and MCV of 63, with further work-up noting iron deficiency anemia.  Patient admitted to the hospitalist service and transfused 2 units of packed red blood cells.  Patient seen by GI with plans for EGD and colonoscopy on 3/22.     Prior to colonoscopy, hemoglobin on morning of 3/22 at 6.9 and as per anesthesia request, additional unit of packed red blood cells transfused.  EGD unremarkable.  Gastroenterology unable to do colonoscopy due to poor prep with still moderate amount of stool present.  Colonoscopy done 3/23 and found to have partially obstructing mass in transverse colon.  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  Oncology and general surgery consulted.  hemicolectomy performed 3/28. ?  ?Clinical Impression ? Pt admitted with above diagnosis. Pt received upright in recliner agreeable to PT services. Pt endorsing 6/10 pain in abdomen from surgery. Reports he is indep with all mobility and ADL's/IADL's at group home. Lives on 3rd story in apartment style housing with room mates that assist in chores. Pt is indep in transfers, mobility and stair completion completing >16 steps with single rail use without balance deficits without any adverse signs and symptoms throughout. Education provided on energy conservation techniques due to reports of increased fatigue and in presence of low Hg with assistance from room mates for household tasks if needed with rest breaks PRN (standing or sitting), and stair navigation with use of rails as needed for support. Pt also educated with demo on  log roll technique to reduce pain/discomfort with bed mobility due to hemicolectomy. Pt able to perform sit <> supine then supine <> sit with correct form/technique with min VC's for sequencing.  Pt denies any further questions or concerns for his mobility and left in room with all needs. PT to sign off with education complete as pt appears to be at baseline mobility. ?  ? ?Recommendations for follow up therapy are one component of a multi-disciplinary discharge planning process, led by the attending physician.  Recommendations may be updated based on patient status, additional functional criteria and insurance authorization. ? ?Follow Up Recommendations No PT follow up ? ?  ?Assistance Recommended at Discharge PRN  ?Patient can return home with the following ? Assistance with cooking/housework ? ?  ?Equipment Recommendations None recommended by PT  ?Recommendations for Other Services ?    ?  ?Functional Status Assessment Patient has had a recent decline in their functional status and demonstrates the ability to make significant improvements in function in a reasonable and predictable amount of time.  ? ?  ?Precautions / Restrictions Precautions ?Precautions: None ?Restrictions ?Weight Bearing Restrictions: No  ? ?  ? ?Mobility ? Bed Mobility ?Overal bed mobility: Independent ?  ?  ?  ?  ?  ?  ?  ?Patient Response: Cooperative ? ?Transfers ?Overall transfer level: Independent ?  ?  ?  ?  ?  ?  ?  ?  ?  ?  ? ?Ambulation/Gait ?Ambulation/Gait assistance: Independent ?Gait Distance (Feet): 600 Feet ?Assistive device: None ?Gait Pattern/deviations: WFL(Within Functional Limits), Step-through pattern ?  ?  ?  ?  ? ?  Stairs ?  ?  ?  ?  ?  ? ?Wheelchair Mobility ?  ? ?Modified Rankin (Stroke Patients Only) ?  ? ?  ? ?Balance Overall balance assessment: No apparent balance deficits (not formally assessed) ?  ?  ?  ?  ?  ?  ?  ?  ?  ?  ?  ?  ?  ?  ?  ?  ?  ?  ?   ? ? ? ?Pertinent Vitals/Pain Pain Assessment ?Pain Assessment:  0-10 ?Pain Score: 6  ?Pain Location: Abdomen at incision ?Pain Descriptors / Indicators: Guarding ?Pain Intervention(s): Limited activity within patient's tolerance  ? ? ?Home Living Family/patient expects to be discharged to:: Group home (apartment style housing on 3rd floor with 3 small flights of stairs.) ?  ?  ?  ?  ?  ?  ?  ?  ?  ?Additional Comments: Simulated independent living environment  ?  ?Prior Function Prior Level of Function : Independent/Modified Independent ?  ?  ?  ?  ?  ?  ?  ?  ?  ? ? ?Hand Dominance  ?   ? ?  ?Extremity/Trunk Assessment  ? Upper Extremity Assessment ?Upper Extremity Assessment: Overall WFL for tasks assessed ?  ? ?Lower Extremity Assessment ?Lower Extremity Assessment: Overall WFL for tasks assessed ?  ? ?   ?Communication  ? Communication: No difficulties  ?Cognition Arousal/Alertness: Awake/alert ?Behavior During Therapy: [facility] for tasks assessed/performed ?Overall Cognitive Status: Within Functional Limits for tasks assessed ?  ?  ?  ?  ?  ?  ?  ?  ?  ?  ?  ?  ?  ?  ?  ?  ?General Comments: Super pleasant and interactive ?  ?  ? ?  ?General Comments   ? ?  ?Exercises Other Exercises ?Other Exercises: Role of PT in acute setting, energy conservation techniques, stair assessment, log roll technique for reducing pain in abdomen  ? ?Assessment/Plan  ?  ?PT Assessment Patient does not need any further PT services  ?PT Problem List   ? ?   ?  ?PT Treatment Interventions     ? ?PT Goals (Current goals can be found in the Care Plan section)  ?Acute Rehab PT Goals ?Patient Stated Goal: to go home ?PT Goal Formulation: With patient ?Time For Goal Achievement: 07/27/21 ?Potential to Achieve Goals: Good ? ?  ?Frequency   ?  ? ? ?Co-evaluation   ?  ?  ?  ?  ? ? ?  ?AM-PAC PT "6 Clicks" Mobility  ?Outcome Measure Help needed turning from your back to your side while in a flat bed without using bedrails?: None ?Help needed moving from lying on your back to sitting on the side of a flat bed  without using bedrails?: None ?Help needed moving to and from a bed to a chair (including a wheelchair)?: None ?Help needed standing up from a chair using your arms (e.g., wheelchair or bedside chair)?: None ?Help needed to walk in hospital room?: None ?Help needed climbing 3-5 steps with a railing? : None ?6 Click Score: 24 ? ?  ?End of Session   ?Activity Tolerance: Patient tolerated treatment well ?Patient left: in chair ?Nurse Communication: Mobility status ?PT Visit Diagnosis: Pain;Muscle weakness (generalized) (M62.81) ?Pain - part of body:  (abdomen) ?  ? ?Time: 7035-0093 ?PT Time Calculation (min) (ACUTE ONLY): 19 min ? ? ?Charges:     ?PT Treatments ?$Therapeutic Activity: 8-22 mins ?  ?   ? ? ?  Salem Caster. Fairly IV, PT, DPT ?Physical Therapist- [place]  ?[facility]  ?07/13/2021, 1:39 PM ? ?

## 2021-07-14 ENCOUNTER — Other Ambulatory Visit: Payer: Self-pay | Admitting: Anatomic Pathology & Clinical Pathology

## 2021-07-14 ENCOUNTER — Encounter: Payer: Self-pay | Admitting: *Deleted

## 2021-07-14 LAB — BASIC METABOLIC PANEL
Anion gap: 6 (ref 5–15)
BUN: 9 mg/dL (ref 6–20)
CO2: 25 mmol/L (ref 22–32)
Calcium: 8.3 mg/dL — ABNORMAL LOW (ref 8.9–10.3)
Chloride: 102 mmol/L (ref 98–111)
Creatinine, Ser: 0.93 mg/dL (ref 0.61–1.24)
GFR, Estimated: >60 mL/min (ref >60)
Glucose, Bld: 78 mg/dL (ref 70–99)
Potassium: 3.9 mmol/L (ref 3.5–5.1)
Sodium: 133 mmol/L — ABNORMAL LOW (ref 135–145)

## 2021-07-14 LAB — CBC
HCT: 27.8 % — ABNORMAL LOW (ref 39.0–52.0)
Hemoglobin: 7.8 g/dL — ABNORMAL LOW (ref 13.0–17.0)
MCH: 21.4 pg — ABNORMAL LOW (ref 26.0–34.0)
MCHC: 28.1 g/dL — ABNORMAL LOW (ref 30.0–36.0)
MCV: 76.4 fL — ABNORMAL LOW (ref 80.0–100.0)
Platelets: 423 10*3/uL — ABNORMAL HIGH (ref 150–400)
RBC: 3.64 MIL/uL — ABNORMAL LOW (ref 4.22–5.81)
WBC: 9.3 10*3/uL (ref 4.0–10.5)
nRBC: 0 % (ref 0.0–0.2)

## 2021-07-14 NOTE — Progress Notes (Signed)
[facility] Progress Note ? ?Patient: Connor Wilkinson    SLH:[id number]  DOA: 07/05/2021    ?Date of Service: the patient was seen and examined on 07/14/2021 ? ?Brief hospital course: ?36 year old male with past medical history of tobacco abuse and bipolar disorder plus PTSD from a group home was receiving work-up for fatigue and found to have low hemoglobin and sent to the emergency room on 3/21.  Lab work revealed hemoglobin of 4.0 with repeat of 4.5 and MCV of 63, with further work-up noting iron deficiency anemia.  Patient admitted to the hospitalist service and transfused 2 units of packed red blood cells.  Patient seen by GI with plans for EGD and colonoscopy on 3/22. ?  ?Prior to colonoscopy, hemoglobin on morning of 3/22 at 6.9 and as per anesthesia request, additional unit of packed red blood cells transfused.  EGD unremarkable.  Gastroenterology unable to do colonoscopy due to poor prep with still moderate amount of stool present.  Colonoscopy done 3/23 and found to have partially obstructing mass in transverse colon.  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  Oncology and general surgery consulted.  hemicolectomy performed 3/28. ? ?Assessment and Plan: ?Assessment and Plan: ?* Adenocarcinoma of colon ([place]) ?This is the cause of patient's iron deficiency anemia.  May have been going on for some time now.  Appreciate GI help.  Confirmed with pathology.  CEA level pending.  Oncology following.  CT of chest abdomen pelvis notes no evidence of metastatic disease; possible some adjacent lymph node spread.  POD2 from hemicolectomy with ileocolostomy. Advancing diet. Healing well, pain controlled. Quite possibly discharge tomorrow ? ?Iron deficiency anemia ?Status post 4 units total packed red blood cells.  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  Underwent iron transfusion on 3/23.  Hgb stable since surgery, 7.8 today. ? ?Nicotine dependence ?Smoking cessation has been discussed with patient in detail ?We will  start him on nicotine gum 2 mg as needed ? ?Bipolar 1 disorder ([place]) ?Stable ?Continue Abilify, Cogentin, fluoxetine and buspirone.  Patient is a ward of the state and decisions to be made through his guardian. ? ? ? ? ? ? ?Body mass index is 23.74 kg/m?.  ?  ?   ? ?Consultants: ?Gastroenterology ?General surgery ?Medical oncology ? ?Procedures: ?Endoscopy 3/22: Unremarkable ?Colonoscopy 3/23: Near occluding mass in the transverse colon ?Transfusion 4 units to date, packed red blood cells ?Surgery 3/28: Mass removal and partial colectomy ? ?Antimicrobials: ?Preop Ancef ? ?Code Status: Full code ? ? ?Subjective: Patient doing okay, no complaints.  Tolerating diet. Some flatus, small bm. No n/v.  ? ?Objective: ?Vital signs were reviewed and unremarkable. ?Vitals:  ? 07/14/21 0558 07/14/21 0840  ?BP: 121/89 128/69  ?Pulse: 89 88  ?Resp: 16 16  ?Temp: (!) 97.5 ?F (36.4 ?C) 97.8 ?F (36.6 ?C)  ?SpO2: 100% 100%  ? ? ?Intake/Output Summary (Last 24 hours) at 07/14/2021 1250 ?Last data filed at 07/14/2021 1032 ?Gross per 24 hour  ?Intake 720 ml  ?Output --  ?Net 720 ml  ? ?Filed Weights  ? 07/05/21 1006 07/06/21 1100 07/12/21 1435  ?Weight: 79.4 kg 79.4 kg 79.4 kg  ? ?Body mass index is 23.74 kg/m?. ? ?Exam: ? ?General: Alert and oriented x3, no acute distress ?HEENT: Normocephalic, atraumatic, mucous membranes are moist ?Cardiovascular: Regular rate and rhythm, S1-S2 ?Respiratory: Clear to auscultation bilaterally ?Abdomen: Soft, nontender,, positive bowel sounds, honeycomb bandage in place ?Musculoskeletal: No clubbing cyanosis or edema ?Skin: No skin breaks, tears or lesions ?Psychiatry: Appropriate, no  evidence of psychoses ?Neurology: No focal deficits ? ?Disposition:  ?Status is: Inpatient ?Patient is inpatient status and needs to continue as he is receiving acute care and services  ?  ? ?Anticipated discharge date: 3/31 ? ? ? ?Family Communication: Henrene Hawking From [place] [facility] at [phone]  updated 3/29.  Message left 3/30. ? ?DVT Prophylaxis: ?enoxaparin (LOVENOX) injection 40 mg Start: 07/13/21 2200 ?SCDs Start: 07/05/21 1313 ? ? ? ?Author: ?Desma Maxim ,MD ?07/14/2021 12:50 PM ? ?To reach On-call, see care teams to locate the attending and reach out via [email or web address]. ?Between 7PM-7AM, please contact night-coverage ?If you still have difficulty reaching the attending provider, please page the [facility] (Director on Call) for [facility] on amion for assistance. ? ?

## 2021-07-14 NOTE — H&P (View-Only) (Signed)
[facility] ?SURGICAL PROGRESS NOTE ? ?Hospital Day(s): 8.  ? ?Post op day(s): 2 Days Post-Op.  ? ?Interval History:  ?Patient seen and examined ?No acute events or new complaints overnight.  ?Patient reports he is feeling much better this morning ?Minimal abdominal soreness ?No fever, chills, nausea, emesis ?Labs are in process this morning  ?He is on CLD ?He has passed flatus and had small BM ?He worked well with therapy yesterday ? ?Vital signs in last 24 hours: [min-max] current  ?Temp:  [97.5 ?F (36.4 ?C)-97.8 ?F (36.6 ?C)] 97.5 ?F (36.4 ?C) (03/30 8882) ?Pulse Rate:  [82-89] 89 (03/30 0558) ?Resp:  [16-18] 16 (03/30 0558) ?BP: (121-136)/(75-89) 121/89 (03/30 0558) ?SpO2:  [100 %] 100 % (03/30 0558)     Height: 6' (182.9 cm) Weight: 79.4 kg BMI (Calculated): 23.74  ? ?Intake/Output last 2 shifts:  ?03/29 0701 - 03/30 0700 ?In: 720 [P.O.:720] ?Out: -   ? ?Physical Exam:  ?Constitutional: alert, cooperative and no distress  ?Respiratory: breathing non-labored at rest  ?Cardiovascular: regular rate and sinus rhythm  ?Gastrointestinal: Soft, incisional soreness expectedly, non-distended, no rebound/guarding ?Integumentary: Mini-laparotomy and laparoscopic incisions are CDI with staples and honeycomb, no erythema ? ?Labs:  ? ?  Latest Ref Rng & Units 07/13/2021  ?  6:19 AM 07/12/2021  ?  6:01 AM 07/11/2021  ?  6:00 AM  ?CBC  ?WBC 4.0 - 10.5 K/uL 12.4   7.2   10.2    ?Hemoglobin 13.0 - 17.0 g/dL 8.6   8.6   7.9    ?Hematocrit 39.0 - 52.0 % 30.5   31.1   29.0    ?Platelets 150 - 400 K/uL 433   439   475    ? ? ?  Latest Ref Rng & Units 07/08/2021  ?  9:58 AM 07/07/2021  ?  4:46 AM 07/06/2021  ?  4:44 AM  ?CMP  ?Glucose 70 - 99 mg/dL 85   89   90    ?BUN 6 - 20 mg/dL '12   9   12    '$ ?Creatinine 0.61 - 1.24 mg/dL 0.79   0.95   0.88    ?Sodium 135 - 145 mmol/L 138   141   138    ?Potassium 3.5 - 5.1 mmol/L 4.3   4.3   4.2    ?Chloride 98 - 111 mmol/L 105   110   107    ?CO2 22 - 32 mmol/L '26   26   27     '$ ?Calcium 8.9 - 10.3 mg/dL 8.6   9.0   8.8    ?Total Protein 6.5 - 8.1 g/dL 6.1      ?Total Bilirubin 0.3 - 1.2 mg/dL 0.5      ?Alkaline Phos 38 - 126 U/L 64      ?AST 15 - 41 U/L 17      ?ALT 0 - 44 U/L 9      ? ? ?Imaging studies: No new pertinent imaging studies ? ? ?Assessment/Plan:  ?37 y.o. male 2 Days Post-Op s/p hand-assisted laparoscopic right extended hemicolectomy with ileocolostomy for transverse colon mass. ? ? - Advance to full liquids this morning. If he is doing well this afternoon, he can advance to soft diet  ? - Discontinue IVF support  ?- Monitor abdominal examination; on-going bowel function ?- Pain control prn; antiemetics prn ?- Mobilization encouraged; PT on-board; no issues; mobilizing well ? ? - Discharge Planning: Pending advancement of diet today. He  is doing well. He may be ready for DC home tomorrow vs Saturday. ? ?All of the above findings and recommendations were discussed with the patient, and the medical team, and all of patient's questions were answered to his expressed satisfaction. ? ?-- ?Edison Simon, PA-C ?[facility] ?07/14/2021, 7:59 AM ?[phone] ?M-F: 7am - 4pm ? ?Pt seen and examined. I agree w Mr. [facility]. Doing very well . May be DC in am. Advance to soft diet ?

## 2021-07-14 NOTE — TOC Progression Note (Addendum)
Transition of Care (TOC) - Progression Note  ? ? ?Patient Details  ?Name: Martinique Coltrane ?MRN: [id number] ?Date of Birth: Mar 29, 1986 ? ?Transition of Care (TOC) CM/SW Contact  ?Pete Pelt, RN ?Phone Number: ?07/14/2021, 11:39 AM ? ?Clinical Narrative:   [place] [phone] anticipate DC tomorrow if tolerates diet ? ?Addendum 1312 Attempted to contact group home.  No answer and vm not set up. ? ?Expected Discharge Plan: Group Home ([place]) ?Barriers to Discharge: Continued Medical Work up ? ?Expected Discharge Plan and Services ?Expected Discharge Plan: Group Home ([place]) ?  ?Discharge Planning Services: CM Consult ?  ?Living arrangements for the past 2 months: Group Home ?                ?  ?  ?  ?  ?  ?  ?  ?  ?  ?  ? ? ?Social Determinants of Health (SDOH) Interventions ?  ? ?Readmission Risk Interventions ? ?  07/06/2021  ?  4:18 PM  ?Readmission Risk Prevention Plan  ?Post Dischage Appt Complete  ?Medication Screening Complete  ?Transportation Screening Complete  ? ? ?

## 2021-07-14 NOTE — Progress Notes (Addendum)
[facility] ?SURGICAL PROGRESS NOTE ? ?Hospital Day(s): 8.  ? ?Post op day(s): 2 Days Post-Op.  ? ?Interval History:  ?Patient seen and examined ?No acute events or new complaints overnight.  ?Patient reports he is feeling much better this morning ?Minimal abdominal soreness ?No fever, chills, nausea, emesis ?Labs are in process this morning  ?He is on CLD ?He has passed flatus and had small BM ?He worked well with therapy yesterday ? ?Vital signs in last 24 hours: [min-max] current  ?Temp:  [97.5 ?F (36.4 ?C)-97.8 ?F (36.6 ?C)] 97.5 ?F (36.4 ?C) (03/30 3716) ?Pulse Rate:  [82-89] 89 (03/30 0558) ?Resp:  [16-18] 16 (03/30 0558) ?BP: (121-136)/(75-89) 121/89 (03/30 0558) ?SpO2:  [100 %] 100 % (03/30 0558)     Height: 6' (182.9 cm) Weight: 79.4 kg BMI (Calculated): 23.74  ? ?Intake/Output last 2 shifts:  ?03/29 0701 - 03/30 0700 ?In: 720 [P.O.:720] ?Out: -   ? ?Physical Exam:  ?Constitutional: alert, cooperative and no distress  ?Respiratory: breathing non-labored at rest  ?Cardiovascular: regular rate and sinus rhythm  ?Gastrointestinal: Soft, incisional soreness expectedly, non-distended, no rebound/guarding ?Integumentary: Mini-laparotomy and laparoscopic incisions are CDI with staples and honeycomb, no erythema ? ?Labs:  ? ?  Latest Ref Rng & Units 07/13/2021  ?  6:19 AM 07/12/2021  ?  6:01 AM 07/11/2021  ?  6:00 AM  ?CBC  ?WBC 4.0 - 10.5 K/uL 12.4   7.2   10.2    ?Hemoglobin 13.0 - 17.0 g/dL 8.6   8.6   7.9    ?Hematocrit 39.0 - 52.0 % 30.5   31.1   29.0    ?Platelets 150 - 400 K/uL 433   439   475    ? ? ?  Latest Ref Rng & Units 07/08/2021  ?  9:58 AM 07/07/2021  ?  4:46 AM 07/06/2021  ?  4:44 AM  ?CMP  ?Glucose 70 - 99 mg/dL 85   89   90    ?BUN 6 - 20 mg/dL '12   9   12    '$ ?Creatinine 0.61 - 1.24 mg/dL 0.79   0.95   0.88    ?Sodium 135 - 145 mmol/L 138   141   138    ?Potassium 3.5 - 5.1 mmol/L 4.3   4.3   4.2    ?Chloride 98 - 111 mmol/L 105   110   107    ?CO2 22 - 32 mmol/L '26   26   27     '$ ?Calcium 8.9 - 10.3 mg/dL 8.6   9.0   8.8    ?Total Protein 6.5 - 8.1 g/dL 6.1      ?Total Bilirubin 0.3 - 1.2 mg/dL 0.5      ?Alkaline Phos 38 - 126 U/L 64      ?AST 15 - 41 U/L 17      ?ALT 0 - 44 U/L 9      ? ? ?Imaging studies: No new pertinent imaging studies ? ? ?Assessment/Plan:  ?36 y.o. male 2 Days Post-Op s/p hand-assisted laparoscopic right extended hemicolectomy with ileocolostomy for transverse colon mass. ? ? - Advance to full liquids this morning. If he is doing well this afternoon, he can advance to soft diet  ? - Discontinue IVF support  ?- Monitor abdominal examination; on-going bowel function ?- Pain control prn; antiemetics prn ?- Mobilization encouraged; PT on-board; no issues; mobilizing well ? ? - Discharge Planning: Pending advancement of diet today. He  is doing well. He may be ready for DC home tomorrow vs Saturday. ? ?All of the above findings and recommendations were discussed with the patient, and the medical team, and all of patient's questions were answered to his expressed satisfaction. ? ?-- ?Edison Simon, PA-C ?[facility] ?07/14/2021, 7:59 AM ?[phone] ?M-F: 7am - 4pm ? ?Pt seen and examined. I agree w Mr. [facility]. Doing very well . May be DC in am. Advance to soft diet ?

## 2021-07-15 ENCOUNTER — Telehealth: Payer: Self-pay | Admitting: *Deleted

## 2021-07-15 LAB — CBC
HCT: 29.8 % — ABNORMAL LOW (ref 39.0–52.0)
Hemoglobin: 8.3 g/dL — ABNORMAL LOW (ref 13.0–17.0)
MCH: 21.6 pg — ABNORMAL LOW (ref 26.0–34.0)
MCHC: 27.9 g/dL — ABNORMAL LOW (ref 30.0–36.0)
MCV: 77.6 fL — ABNORMAL LOW (ref 80.0–100.0)
Platelets: 447 10*3/uL — ABNORMAL HIGH (ref 150–400)
RBC: 3.84 MIL/uL — ABNORMAL LOW (ref 4.22–5.81)
WBC: 7.5 10*3/uL (ref 4.0–10.5)
nRBC: 0 % (ref 0.0–0.2)

## 2021-07-15 MED ORDER — POLYETHYLENE GLYCOL 3350 17 G PO PACK: 17.0000 g | PACK | Freq: Every day | ORAL | 0 refills | Status: DC | PRN

## 2021-07-15 MED ORDER — OXYCODONE HCL 5 MG PO TABS: 5.0000 mg | ORAL_TABLET | Freq: Four times a day (QID) | ORAL | 0 refills | Status: DC | PRN

## 2021-07-15 MED ORDER — IBUPROFEN 600 MG PO TABS: 600.0000 mg | ORAL_TABLET | Freq: Four times a day (QID) | ORAL | 0 refills | Status: DC | PRN

## 2021-07-15 NOTE — TOC Transition Note (Addendum)
Transition of Care (TOC) - CM/SW Discharge Note ? ? ?Patient Details  ?Name: Connor Wilkinson ?MRN: [id number] ?Date of Birth: 1985/08/30 ? ?Transition of Care (TOC) CM/SW Contact:  ?Donielle Kaigler E Linder Prajapati, LCSW ?Phone Number: ?07/15/2021, 10:21 AM ? ? ?Clinical Narrative:   Patient to DC back to [facility] today. They will pick up around 1 or 130 pm. RN and MD aware. Legal Guardian [name] aware. ? ?2:00- Notified by RN that the group home has not come to pick patient up yet. ?Attempted call to [facility], no answer and VM full. ?Attempted call to Guardian [name], left VM.  ? ?2:10- Call from [place] who said she spoke with [facility] and they will be on the way to the hospital to get patient soon. Notified RN. ? ?Final next level of care: Group Home ?Barriers to Discharge: Barriers Resolved ? ? ?Patient Goals and CMS Choice ?Patient states their goals for this hospitalization and ongoing recovery are:: back to group home ?CMS Medicare.gov Compare Post Acute Care list provided to:: Legal Guardian ?Choice offered to / list presented to : [facility] POA / Guardian ? ?Discharge Placement ?  ?           ?  ?  ?  ?Patient and family notified of of transfer: 07/15/21 ? ?Discharge Plan and Services ?  ?Discharge Planning Services: CM Consult ?           ?  ?  ?  ?  ?  ?  ?  ?  ?  ?  ? ?Social Determinants of Health (SDOH) Interventions ?  ? ? ?Readmission Risk Interventions ? ?  07/06/2021  ?  4:18 PM  ?Readmission Risk Prevention Plan  ?Post Dischage Appt Complete  ?Medication Screening Complete  ?Transportation Screening Complete  ? ? ? ? ? ?

## 2021-07-15 NOTE — Discharge Summary (Signed)
Connor Wilkinson GHW:[id number] DOB: 08-24-1985 DOA: 07/05/2021 ? ?PCP: [name], MD ? ?Admit date: 07/05/2021 ?Discharge date: 07/15/2021 ? ?Time spent: 35 minutes ? ?Recommendations for Outpatient Follow-up:  ?Oncology f/u 1 week ?General surgery f/u 10 days for staple removal  ? ? ? ?Discharge Diagnoses:  ?Principal Problem: ?  Adenocarcinoma of colon ([place]) ?Active Problems: ?  Iron deficiency anemia ?  Bipolar 1 disorder ([place]) ?  Nicotine dependence ? ? ?Discharge Condition: stable ? ?Diet recommendation: regular ? ?Filed Weights  ? 07/05/21 1006 07/06/21 1100 07/12/21 1435  ?Weight: 79.4 kg 79.4 kg 79.4 kg  ? ? ?History of present illness:  ?Connor Wilkinson is a 36 y.o. male with medical history significant for bipolar disorder, PTSD, nicotine dependence who currently resides in a group home and was referred to the ER for evaluation of low hemoglobin. ?Patient states that he has felt weak and fatigued for several weeks and had blood work done which revealed a hemoglobin of 4 and potassium of 5.3. ?He denies having any hematemesis, no hematochezia, no passage of melena stools, no chest pain, no shortness of breath, no dizziness, no lightheadedness, no falls, no loss of consciousness or NSAID use. ?He states that his father has a history of kidney cancer and he has an aunt with leukemia but no family history of colon cancer. ?Repeat labs in the ER showed a hemoglobin of 4.5g/dl, MCV of 63, platelet count of 667, serum iron of 9, ferritin of 1 ? ?Hospital Course:  ?Adenocarcinoma of colon ([place]) ?This is the cause of patient's iron deficiency anemia.  May have been going on for some time now.  CT of chest abdomen pelvis notes no evidence of metastatic disease; possible some adjacent lymph node spread.  Now post-op from hemicolectomy with ileocolostomy. Diet advanced, pain controlled. Will need gen surg f/u 10 days for staple removal, oncology f/u 1 week. ?  ?Iron deficiency anemia ?Status post 4 units total packed  red blood cells.  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  Underwent iron transfusion on 3/23.  Hgb stable since surgery ?  ?Nicotine dependence ?Smoking cessation has been discussed with patient in detail ?  ?Bipolar 1 disorder ([place]) ?Stable ?Continue Abilify, Cogentin, fluoxetine and buspirone.  Patient is a ward of the state and decisions were made through his guardian. ? ?Procedures: ?Hemicolectomy with ileocolostomy 3/28  ? ?Consultations: ?Gen surg, oncology ? ?Discharge Exam: ?Vitals:  ? 07/15/21 0610 07/15/21 0824  ?BP: 112/63 112/72  ?Pulse: 80 86  ?Resp: 16 16  ?Temp: 98.1 ?F (36.7 ?C) 98.9 ?F (37.2 ?C)  ?SpO2: 100% 100%  ? ? ?General: no distress ?Cardiovascular: rrr ?Respiratory: ctab ?Abd: soft, mild tenderness, staples in place surgical site c/d/i ? ?Discharge Instructions ? ? ?Discharge Instructions   ? ? Diet - low sodium heart healthy   Complete by: As directed ?  ? Increase activity slowly   Complete by: As directed ?  ? No wound care   Complete by: As directed ?  ? ?  ? ?Allergies as of 07/15/2021   ? ?   Reactions  ? Geodon [ziprasidone Hcl] Anaphylaxis  ? ?  ? ?  ?Medication List  ?  ? ?TAKE these medications   ? ?ARIPiprazole 10 MG tablet ?Commonly known as: ABILIFY ?Take 10 mg by mouth daily. ?  ?ARIPiprazole ER 400 MG Prsy prefilled syringe ?Commonly known as: ABILIFY MAINTENA ?Inject 400 mg into the muscle every 21 ( twenty-one) days. ?  ?benztropine 2 MG tablet ?Commonly known as:  COGENTIN ?Take 2 mg by mouth at bedtime. ?  ?busPIRone 10 MG tablet ?Commonly known as: BUSPAR ?Take 10 mg by mouth 2 (two) times daily. ?  ?FLUoxetine 20 MG capsule ?Commonly known as: PROZAC ?Take 20 mg by mouth daily. ?  ?ibuprofen 600 MG tablet ?Commonly known as: ADVIL ?Take 1 tablet (600 mg total) by mouth every 6 (six) hours as needed. ?  ?oxyCODONE 5 MG immediate release tablet ?Commonly known as: Oxy IR/ROXICODONE ?Take 1 tablet (5 mg total) by mouth every 6 (six) hours as needed for severe pain or breakthrough pain. ?   ?polyethylene glycol 17 g packet ?Commonly known as: MIRALAX / GLYCOLAX ?Take 17 g by mouth daily as needed (constipation). ?  ?prazosin 2 MG capsule ?Commonly known as: MINIPRESS ?Take 2 mg by mouth 2 (two) times daily. ?  ? ?  ? ?Allergies  ?Allergen Reactions  ? Geodon [Ziprasidone Hcl] Anaphylaxis  ? ? Follow-up Information   ? ? Tylene Fantasia, PA-C Follow up in 10 day(s).   ?Specialty: Physician Assistant ?Why: s/p laparoscopic right colectomy ?Contact information: ?[address] ?[address] ?[place] [address] ?[phone] ? ? ?  ?  ? ? Sindy Guadeloupe, MD. Schedule an appointment as soon as possible for a visit in 1 week(s).   ?Specialty: Oncology ?Contact information: ?[place] [address] ?[phone] ? ? ?  ?  ? ?  ?  ? ?  ? ? ? ?The results of significant diagnostics from this hospitalization (including imaging, microbiology, ancillary and laboratory) are listed below for reference.   ? ?Significant Diagnostic Studies: ?CT CHEST ABDOMEN PELVIS W CONTRAST ? ?Result Date: 07/07/2021 ?CLINICAL DATA:  Colon carcinoma, evaluate for metastatic disease EXAM: CT CHEST, ABDOMEN, AND PELVIS WITH CONTRAST TECHNIQUE: Multidetector CT imaging of the chest, abdomen and pelvis was performed following the standard protocol during bolus administration of intravenous contrast. RADIATION DOSE REDUCTION: This exam was performed according to the departmental dose-optimization program which includes automated exposure control, adjustment of the mA and/or kV according to patient size and/or use of iterative reconstruction technique. CONTRAST:  183m OMNIPAQUE IOHEXOL 300 MG/ML  SOLN COMPARISON:  None. FINDINGS: CT CHEST FINDINGS Cardiovascular: Unremarkable. Mediastinum/Nodes: No significant lymphadenopathy seen in the mediastinum. Lungs/Pleura: There are no discrete lung nodules. There is no focal pulmonary consolidation. There is no pleural effusion or pneumothorax. CT ABDOMEN PELVIS FINDINGS  Hepatobiliary: No focal abnormality is seen in the liver. There is no dilation of bile ducts. Gallbladder is unremarkable. There is minimal amount of ascites adjacent to the liver. Pancreas: No focal abnormality is seen. Spleen: Unremarkable. Adrenals/Urinary Tract: Adrenals are not enlarged. There is no hydronephrosis. There are no renal or ureteral stones. Urinary bladder is unremarkable. Stomach/Bowel: Stomach is unremarkable. Small bowel loops are not dilated. Appendix is not dilated. There is abnormal circumferential wall thickening in the proximal transverse colon close to the hepatic flexure. Length of the lesion is proximally 6.2 cm. Wall thickness measures up to 2.2 cm. No other focal abnormality is seen in the colon. There is fluid in the lumen of rectosigmoid. Vascular/Lymphatic: Vascular structures are unremarkable. There are abnormally enlarged lymph nodes immediately superior to the circumferential colonic mass. Lymph nodes measure up to 2.5 cm in short axis. Reproductive: Unremarkable. Other: Small amount of ascites is present in the pelvic cavity. No definite demonstrable discrete nodules seen in the peritoneum. Musculoskeletal: There is 6 mm sclerotic density in the right acetabulum. There is 15 x 6 mm linear sclerotic  density in the neck of right femur. IMPRESSION: There is circumferential soft tissue mass lesion in the proximal transverse colon measuring 6.2 cm in length and 2.2 cm in maximal wall thickness. This finding would be consistent with history of colon carcinoma. There are pathologically enlarged lymph nodes immediately superior to the circumferential colonic lesion suggesting metastatic lymphadenopathy. There are 2 small sclerotic densities in the right acetabulum and neck of proximal right femur which may suggest incidental benign process such as bone islands or sclerotic metastatic disease. No other discrete skeletal lesions are seen. If clinically warranted, radionuclide bone scan or  PET-CT should be considered. If the patient has any prior studies in outside institutions, they would be of help to assess stability or interval change. Small amount of ascites is present in the pelvic cavit

## 2021-07-15 NOTE — Progress Notes (Signed)
[facility] ?SURGICAL PROGRESS NOTE ? ?Hospital Day(s): 9.  ? ?Post op day(s): 3 Days Post-Op.  ? ?Interval History:  ?Patient seen and examined ?No acute events or new complaints overnight.  ?Patient reports he is doing much better; u sitting in chair on computer ?Abdominal soreness now 4/10 ?No fever, chills, nausea, emesis ?No new labs ?He is tolerating regular diet ?Passing flatus and had BM x3 ? ?Vital signs in last 24 hours: [min-max] current  ?Temp:  [97.8 ?F (36.6 ?C)-98.8 ?F (37.1 ?C)] 98.1 ?F (36.7 ?C) (03/31 2440) ?Pulse Rate:  [80-88] 80 (03/31 0610) ?Resp:  [16] 16 (03/31 0610) ?BP: (112-128)/(63-87) 112/63 (03/31 1027) ?SpO2:  [100 %] 100 % (03/31 0610)     Height: 6' (182.9 cm) Weight: 79.4 kg BMI (Calculated): 23.74  ? ?Intake/Output last 2 shifts:  ?03/30 0701 - 03/31 0700 ?In: 600 [P.O.:600] ?Out: -   ? ?Physical Exam:  ?Constitutional: alert, cooperative and no distress  ?Respiratory: breathing non-labored at rest  ?Cardiovascular: regular rate and sinus rhythm  ?Gastrointestinal: Soft, incisional soreness expectedly, non-distended, no rebound/guarding ?Integumentary: Mini-laparotomy and laparoscopic incisions are CDI with staples, no erythema ? ?Labs:  ? ?  Latest Ref Rng & Units 07/14/2021  ?  7:05 AM 07/13/2021  ?  6:19 AM 07/12/2021  ?  6:01 AM  ?CBC  ?WBC 4.0 - 10.5 K/uL 9.3   12.4   7.2    ?Hemoglobin 13.0 - 17.0 g/dL 7.8   8.6   8.6    ?Hematocrit 39.0 - 52.0 % 27.8   30.5   31.1    ?Platelets 150 - 400 K/uL 423   433   439    ? ? ?  Latest Ref Rng & Units 07/14/2021  ?  7:05 AM 07/08/2021  ?  9:58 AM 07/07/2021  ?  4:46 AM  ?CMP  ?Glucose 70 - 99 mg/dL 78   85   89    ?BUN 6 - 20 mg/dL '9   12   9    '$ ?Creatinine 0.61 - 1.24 mg/dL 0.93   0.79   0.95    ?Sodium 135 - 145 mmol/L 133   138   141    ?Potassium 3.5 - 5.1 mmol/L 3.9   4.3   4.3    ?Chloride 98 - 111 mmol/L 102   105   110    ?CO2 22 - 32 mmol/L '25   26   26    '$ ?Calcium 8.9 - 10.3 mg/dL 8.3   8.6   9.0    ?Total Protein  6.5 - 8.1 g/dL  6.1     ?Total Bilirubin 0.3 - 1.2 mg/dL  0.5     ?Alkaline Phos 38 - 126 U/L  64     ?AST 15 - 41 U/L  17     ?ALT 0 - 44 U/L  9     ? ? ?Imaging studies: No new pertinent imaging studies ? ? ?Assessment/Plan:  ?36 y.o. male 3 Days Post-Op s/p hand-assisted laparoscopic right extended hemicolectomy with ileocolostomy for transverse colon mass. ? ? - Okay to continue soft/regular diet ?- Monitor abdominal examination; on-going bowel function ?- Pain control prn; antiemetics prn ?- Mobilization encouraged; PT on-board; no issues; mobilizing well ? ? - Discharge Planning: Okay for discharge home this morning. He can follow up in ~10 days for staple removal. Reviewed discharge instructions  ? ?All of the above findings and recommendations were discussed with the patient, and the  medical team, and all of patient's questions were answered to his expressed satisfaction. ? ?-- ?Edison Simon, PA-C ?[facility] ?07/15/2021, 7:07 AM ?[phone] ?M-F: 7am - 4pm ?

## 2021-07-15 NOTE — TOC Progression Note (Addendum)
Transition of Care (TOC) - Progression Note  ? ? ?Patient Details  ?Name: Connor Wilkinson ?MRN: [id number] ?Date of Birth: 12-08-1985 ? ?Transition of Care (TOC) CM/SW Contact  ?Akanksha Bellmore E Drezden Seitzinger, LCSW ?Phone Number: ?07/15/2021, 9:18 AM ? ?Clinical Narrative:    ?Patient medically ready to DC back to group home. Attempted to reach out to [employer] at group home to arrange transport. No answer and VM not set up. Will continue to try. ?Attempted call to [place], left VM requesting return call.  ? ?9:40- Call from [place] who stated patient's Guardian told him to call CSW back. Per [facility], they will pick up patient at 1 or 130 pm today to DC back to group home. Updated RN and MD. ? ?10:00- Call from [place] who stated their Nurse thinks patient will need more care then the group home can provide since he was independent prior to coming. Explained that PT worked with patient and he was independent and that MD knows that patient lives at a group home and has medically cleared patient to DC back there. CSW spoke to MD again and confirmed patient is still independent and just needs transport to his cancer appts, MD said he can talk to group home staff if needed to explain patient's condition. [facility] stated he is going to call the Legal Guardian then call CSW back.  ? ?10:15- Call from [place] who stated she has spoken to the MD and [facility] at the group home. She feels patient is appropriate to return to the group home and said the plan is still for patient to be picked up around 1 or 1:30. She stated if patient does get to where he needs a higher level of care they can arrange it but she does not feel he needs a higher level of care at this point. [name] stated she will let CSW know if anything changes.  ? ? ?Expected Discharge Plan: Group Home ([place]) ?Barriers to Discharge: Continued Medical Work up ? ?Expected Discharge Plan and Services ?Expected Discharge Plan: Group Home ([place]) ?   ?Discharge Planning Services: CM Consult ?  ?Living arrangements for the past 2 months: Group Home ?Expected Discharge Date: 07/15/21               ?  ?  ?  ?  ?  ?  ?  ?  ?  ?  ? ? ?Social Determinants of Health (SDOH) Interventions ?  ? ?Readmission Risk Interventions ? ?  07/06/2021  ?  4:18 PM  ?Readmission Risk Prevention Plan  ?Post Dischage Appt Complete  ?Medication Screening Complete  ?Transportation Screening Complete  ? ? ?

## 2021-07-15 NOTE — Discharge Instructions (Signed)
In addition to included general post-operative instructions,  Diet: Resume home diet.   Activity: No heavy lifting >20 pounds (children, pets, laundry, garbage) for 6 weeks, but light activity and walking are encouraged. Do not drive or drink alcohol if taking narcotic pain medications or having pain that might distract from driving.  Wound care: You may shower/get incision wet with soapy water and pat dry (do not rub incisions), but no baths or submerging incision underwater until follow-up.   Medications: Resume all home medications. For mild to moderate pain: acetaminophen (Tylenol) or ibuprofen/naproxen (if no kidney disease). Combining Tylenol with alcohol can substantially increase your risk of causing liver disease. Narcotic pain medications, if prescribed, can be used for severe pain, though may cause nausea, constipation, and drowsiness. Do not combine Tylenol and Percocet (or similar) within a 6 hour period as Percocet (and similar) contain(s) Tylenol. If you do not need the narcotic pain medication, you do not need to fill the prescription.  Call office ([phone] / [phone]) at any time if any questions, worsening pain, fevers/chills, bleeding, drainage from incision site, or other concerns.  

## 2021-07-15 NOTE — Progress Notes (Signed)
AVS reviewed with patient. Pharmacy verified with patient. Patient had no IV present- verified. Group home to pick patient up at 1pm. No further needs.  ?

## 2021-07-15 NOTE — Telephone Encounter (Signed)
Called Connor Wilkinson and he said that he has the my chart for the pt to be hooked to his phone. He knows of the appt 4/5 and I also called Connor Wilkinson  joe [phone] and he knew about the appt and told me [facility] is the one that takes him to his appts ?

## 2021-07-15 NOTE — NC FL2 (Signed)
?  [place] MEDICAID FL2 LEVEL OF CARE SCREENING TOOL  ?  ? ?IDENTIFICATION  ?Patient Name: ?Connor Wilkinson Birthdate: 1986/03/30 Sex: male Admission Date (Current Location): ?07/05/2021  ?[place] and [place] Number: ? [place] ?  Facility and Address:  ?[facility], [address] [place], [place] [address] ?     Provider Number: ?[id number]  ?Attending Physician Name and Address:  ?Gwynne Edinger, MD ? Relative Name and Phone Number:  ?Joe,[name] (Legal Guardian)   [phone] [facility]) ?   ?Current Level of Care: ?Hospital Recommended Level of Care: ?Family Care Home Prior Approval Number: ?  ? ?Date Approved/Denied: ?  PASRR Number: ?  ? ?Discharge Plan: ?  ?  ? ?Current Diagnoses: ?Patient Active Problem List  ? Diagnosis Date Noted  ? Adenocarcinoma of colon ([place])   ? Iron deficiency anemia 07/05/2021  ? Bipolar 1 disorder ([place])   ? Nicotine dependence   ? Orthostasis 10/23/2017  ? Weight loss 10/23/2017  ? Nightmares 10/23/2017  ? Seizures ([place]) 10/23/2017  ? Depression 10/23/2017  ? Syncope 10/19/2017  ? ? ?Orientation RESPIRATION BLADDER Height & Weight   ?  ?Self, Time, Situation, Place ? Normal Continent Weight: 175 lb 0.7 oz (79.4 kg) ?Height:  6' (182.9 cm)  ?BEHAVIORAL SYMPTOMS/MOOD NEUROLOGICAL BOWEL NUTRITION STATUS  ?    Continent Diet (regular)  ?AMBULATORY STATUS COMMUNICATION OF NEEDS Skin   ?Independent Verbally  (abdomen- honeycomb dressing) ?  ?  ?  ?    ?     ?     ? ? ?Personal Care Assistance Level of Assistance  ?Bathing, Feeding, Dressing Bathing Assistance: Independent ?Feeding assistance: Independent ?Dressing Assistance: Independent ?   ? ?Functional Limitations Info  ?    ?  ?   ? ? ?SPECIAL CARE FACTORS FREQUENCY  ?    ?  ?  ?  ?  ?  ?  ?   ? ? ?Contractures    ? ? ?Additional Factors Info  ?Code Status, Allergies Code Status Info: full ?Allergies Info: geodon ?  ?  ?  ?   ? ?Current Medications (07/15/2021):   ?TAKE these medications   ?  ?ARIPiprazole 10  MG tablet ?Commonly known as: ABILIFY ?Take 10 mg by mouth daily. ?   ?ARIPiprazole ER 400 MG Prsy prefilled syringe ?Commonly known as: ABILIFY MAINTENA ?Inject 400 mg into the muscle every 21 ( twenty-one) days. ?   ?benztropine 2 MG tablet ?Commonly known as: COGENTIN ?Take 2 mg by mouth at bedtime. ?   ?busPIRone 10 MG tablet ?Commonly known as: BUSPAR ?Take 10 mg by mouth 2 (two) times daily. ?   ?FLUoxetine 20 MG capsule ?Commonly known as: PROZAC ?Take 20 mg by mouth daily. ?   ?ibuprofen 600 MG tablet ?Commonly known as: ADVIL ?Take 1 tablet (600 mg total) by mouth every 6 (six) hours as needed. ?   ?oxyCODONE 5 MG immediate release tablet ?Commonly known as: Oxy IR/ROXICODONE ?Take 1 tablet (5 mg total) by mouth every 6 (six) hours as needed for severe pain or breakthrough pain. ?   ?polyethylene glycol 17 g packet ?Commonly known as: MIRALAX / GLYCOLAX ?Take 17 g by mouth daily as needed (constipation). ?   ?prazosin 2 MG capsule ?Commonly known as: MINIPRESS ?Take 2 mg by mouth 2 (two) times daily.  ? ?Relevant Imaging Results: ? ?Relevant Lab Results: ? ? ?Additional Information ?SS #: [phone] [id number] [phone] ? ?Amie Cowens E Kyndall Chaplin, LCSW ? ? ? ? ?

## 2021-07-20 ENCOUNTER — Inpatient Hospital Stay (HOSPITAL_BASED_OUTPATIENT_CLINIC_OR_DEPARTMENT_OTHER): Payer: Medicaid Other | Admitting: Oncology

## 2021-07-20 ENCOUNTER — Telehealth: Payer: Self-pay | Admitting: Surgery

## 2021-07-20 ENCOUNTER — Inpatient Hospital Stay: Payer: Medicaid Other | Attending: Oncology

## 2021-07-20 ENCOUNTER — Encounter: Payer: Self-pay | Admitting: Oncology

## 2021-07-20 ENCOUNTER — Telehealth: Payer: Self-pay | Admitting: *Deleted

## 2021-07-20 VITALS — BP 122/72 | HR 71 | Temp 97.7°F | Resp 16 | Wt 164.5 lb

## 2021-07-20 DIAGNOSIS — Z803 Family history of malignant neoplasm of breast: Secondary | ICD-10-CM | POA: Diagnosis not present

## 2021-07-20 DIAGNOSIS — Z7189 Other specified counseling: Secondary | ICD-10-CM | POA: Diagnosis not present

## 2021-07-20 DIAGNOSIS — Z5111 Encounter for antineoplastic chemotherapy: Secondary | ICD-10-CM | POA: Insufficient documentation

## 2021-07-20 DIAGNOSIS — D509 Iron deficiency anemia, unspecified: Secondary | ICD-10-CM | POA: Insufficient documentation

## 2021-07-20 DIAGNOSIS — D649 Anemia, unspecified: Secondary | ICD-10-CM

## 2021-07-20 DIAGNOSIS — F17210 Nicotine dependence, cigarettes, uncomplicated: Secondary | ICD-10-CM | POA: Insufficient documentation

## 2021-07-20 DIAGNOSIS — Z8051 Family history of malignant neoplasm of kidney: Secondary | ICD-10-CM | POA: Diagnosis not present

## 2021-07-20 DIAGNOSIS — C189 Malignant neoplasm of colon, unspecified: Secondary | ICD-10-CM | POA: Diagnosis present

## 2021-07-20 DIAGNOSIS — D500 Iron deficiency anemia secondary to blood loss (chronic): Secondary | ICD-10-CM

## 2021-07-20 LAB — CBC
HCT: 32.9 % — ABNORMAL LOW (ref 39.0–52.0)
Hemoglobin: 9.3 g/dL — ABNORMAL LOW (ref 13.0–17.0)
MCH: 22.6 pg — ABNORMAL LOW (ref 26.0–34.0)
MCHC: 28.3 g/dL — ABNORMAL LOW (ref 30.0–36.0)
MCV: 79.9 fL — ABNORMAL LOW (ref 80.0–100.0)
Platelets: 558 10*3/uL — ABNORMAL HIGH (ref 150–400)
RBC: 4.12 MIL/uL — ABNORMAL LOW (ref 4.22–5.81)
WBC: 9.1 10*3/uL (ref 4.0–10.5)
nRBC: 0 % (ref 0.0–0.2)

## 2021-07-20 NOTE — Telephone Encounter (Signed)
Labs added.

## 2021-07-20 NOTE — Telephone Encounter (Signed)
Spoke with Connor Wilkinson for Connor Wilkinson.  He is made aware of port a cath placement for Connor Wilkinson scheduled for Friday July 22, 2021 with Dr. Dahlia Byes. ? ?Pre-Admission date/time, COVID Testing date and Surgery date. ? ?Surgery Date: 07/22/21 ?Preadmission Testing Date: 07/21/21 (phone 1p-5p) ?Covid Testing Date: Not needed.    ? ?Also they are to call at [phone], between 1-3:00pm the day before surgery, to find out what time to arrive for surgery.   ? ?

## 2021-07-21 ENCOUNTER — Encounter
Admission: RE | Admit: 2021-07-21 | Discharge: 2021-07-21 | Disposition: A | Discharge status: Home / Self Care | Payer: Medicaid Other | Source: Ambulatory Visit | Attending: Surgery | Admitting: Surgery

## 2021-07-21 ENCOUNTER — Other Ambulatory Visit: Payer: Self-pay

## 2021-07-21 ENCOUNTER — Encounter: Payer: Self-pay | Admitting: Oncology

## 2021-07-21 ENCOUNTER — Other Ambulatory Visit: Payer: Medicaid Other

## 2021-07-21 ENCOUNTER — Telehealth: Payer: Self-pay | Admitting: *Deleted

## 2021-07-21 MED ORDER — ONDANSETRON HCL 8 MG PO TABS: 8.0000 mg | ORAL_TABLET | Freq: Two times a day (BID) | ORAL | 1 refills | Status: DC | PRN

## 2021-07-21 MED ORDER — PROCHLORPERAZINE MALEATE 10 MG PO TABS: 10.0000 mg | ORAL_TABLET | Freq: Four times a day (QID) | ORAL | 1 refills | Status: DC | PRN

## 2021-07-21 MED ORDER — DEXAMETHASONE 4 MG PO TABS: 8.0000 mg | ORAL_TABLET | Freq: Every day | ORAL | 1 refills | Status: DC

## 2021-07-21 MED ORDER — LIDOCAINE-PRILOCAINE 2.5-2.5 % EX CREA: TOPICAL_CREAM | CUTANEOUS | 3 refills | Status: DC

## 2021-07-21 NOTE — Telephone Encounter (Signed)
Dr. Janese Banks did a call conference for Connor Wilkinson who is over the group home the pt is in and [name] Joe who is the gaurdian for this pt. [name] gave permission with Dr. Janese Banks and then I called her and she gave permission also. Call was 1 min. apart ?

## 2021-07-21 NOTE — Patient Instructions (Addendum)
Your procedure is scheduled on: 07/22/2021 ?Report to the Registration Desk on the 1st floor of the [facility]. ?To find out your arrival time, please call [phone] between 1PM - 3PM on: 07/22/2021 ? ?REMEMBER: ?Instructions that are not followed completely may result in serious medical risk, up to and including death; or upon the discretion of your surgeon and anesthesiologist your surgery may need to be rescheduled. ? ?Do not eat food after midnight the night before surgery.  ?No gum chewing, lozengers or hard candies. ? ? ?TAKE THESE MEDICATIONS THE MORNING OF SURGERY WITH A SIP OF WATER: ?- busPIRone (BUSPAR) 10 MG ?- FLUoxetine (PROZAC) 20 MG  ?- prazosin (MINIPRESS) 2 MG  ? ?One week prior to surgery: ?Stop Anti-inflammatories (NSAIDS) such as Advil, Aleve, Ibuprofen, Motrin, Naproxen, Naprosyn and Aspirin based products such as Excedrin, Goodys Powder, BC Powder. You may however, continue to take Tylenol if needed for pain up until the day of surgery. ? ?Stop any over the counter vitamins and supplements until after surgery. ? ? ?No Alcohol for 24 hours before or after surgery. ? ?No Smoking including e-cigarettes for 24 hours prior to surgery.  ?No chewable tobacco products for at least 6 hours prior to surgery.  ?No nicotine patches on the day of surgery. ? ?Do not use any "recreational" drugs for at least a week prior to your surgery.  ?Please be advised that the combination of cocaine and anesthesia may have negative outcomes, up to and including death. ?If you test positive for cocaine, your surgery will be cancelled. ? ?On the morning of surgery brush your teeth with toothpaste and water, you may rinse your mouth with mouthwash if you wish. ?Do  ? ?Do not shave body from the neck down 48 hours prior to surgery just in case you cut yourself which could leave a site for infection.  ? ?Do not bring valuables to the hospital. [facility] is not responsible for any missing/lost belongings or  valuables.  ? ?Notify your doctor if there is any change in your medical condition (cold, fever, infection). ? ?Wear comfortable clothing (specific to your surgery type) to the hospital. ? ?If you are being admitted to the hospital overnight, leave your suitcase in the car. ?After surgery it may be brought to your room. ? ?If you are being discharged the day of surgery, you will not be allowed to drive home. ?You will need a responsible adult (18 years or older) to drive you home and stay with you that night.  ? ?If you are taking public transportation, you will need to have a responsible adult (18 years or older) with you. ?Please confirm with your physician that it is acceptable to use public transportation.  ? ?Please call the [place] Dept. at [phone] if you have any questions about these instructions. ? ?Surgery Visitation Policy: ? ?Patients undergoing a surgery or procedure may have one family member or support person in the surgical waiting area with them as long as that person is not COVID-19 positive or experiencing its symptoms.  ?The patient is not allowed to have any visitors in the pre-operative area. Visitors must remain in the surgical waiting room.  ? ?Inpatient Visitation:   ? ?Visiting hours are 7 a.m. to 8 p.m. ?Up to two visitors ages 16+ are allowed at one time in a patient room. The visitors may rotate out with other people during the day. Visitors must check out when they leave, or other visitors will  not be allowed. One designated support person may remain overnight. ?The visitor must pass COVID-19 screenings, use hand sanitizer when entering and exiting the patient?s room and wear a mask at all times, including in the patient?s room. ?Patients must also wear a mask when staff or their visitor are in the room. ?Masking is required regardless of vaccination status.   ?

## 2021-07-21 NOTE — Pre-Procedure Instructions (Signed)
I spoke with the patient's legal guardian Tommie Ard Joe) and obtained phone consent for the patient's procedure with a secondary witness. Surgical consent was signed and faxed to [facility] for review with anesthesia and the surgeon.  ?

## 2021-07-21 NOTE — Progress Notes (Signed)
START ON PATHWAY REGIMEN - Colorectal ? ? ?  A cycle is every 14 days: ?    Oxaliplatin  ?    Leucovorin  ?    Fluorouracil  ?    Fluorouracil  ? ?**Always confirm dose/schedule in your pharmacy ordering system** ? ?Patient Characteristics: ?Postoperative without Neoadjuvant Therapy (Pathologic Staging), Colon, Stage III, High Risk (pT4 or pN2) ?Tumor Location: Colon ?Therapeutic Status: Postoperative without Neoadjuvant Therapy (Pathologic Staging) ?AJCC M Category: cM0 ?AJCC T Category: pT4a ?AJCC N Category: pN2b ?AJCC 8 Stage Grouping: IIIC ?Intent of Therapy: ?Curative Intent, Discussed with Patient ?

## 2021-07-21 NOTE — Progress Notes (Signed)
Tumor Board Documentation ? ?Connor Wilkinson was presented by Dr Janese Banks at our Tumor Board on 07/21/2021, which included representatives from medical oncology, radiology, pathology, surgical, pharmacy, pulmonology, genetics, nutrition, research, navigation, radiation oncology, internal medicine, palliative care. ? ?Connor currently presents as a new patient, for Accident, for new positive pathology with history of the following treatments: active survellience, surgical intervention(s). ? ?Additionally, we reviewed previous medical and familial history, history of present illness, and recent lab results along with all available histopathologic and imaging studies. The tumor board considered available treatment options and made the following recommendations: ?Adjuvant chemotherapy, Additional screening (Bone Scan,) ?Referal to [employer]; Will discss with Dr Dahlia Byes regarding if patient needs additional surgery for the mesenteric margins being positive ? ?The following procedures/referrals were also placed: No orders of the defined types were placed in this encounter. ? ? ?Clinical Trial Status: not discussed  ? 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 ? ?National site-specific guidelines NCCN were discussed with respect to the case. ? ?Tumor board is a meeting of clinicians from various specialty areas who evaluate and discuss patients for whom a multidisciplinary approach is being considered. Final determinations in the plan of care are those of the provider(s). The responsibility for follow up of recommendations given during tumor board is that of the provider.  ? ?Today?s extended care, comprehensive team conference, Connor was not present for the discussion and was not examined.  ? ?Multidisciplinary Tumor Board is a multidisciplinary case peer review process.  Decisions discussed in the Multidisciplinary Tumor Board reflect  the opinions of the specialists present at the conference without having examined the patient.  Ultimately, treatment and diagnostic decisions rest with the primary provider(s) and the patient. ? ?

## 2021-07-21 NOTE — Anesthesia Preprocedure Evaluation (Addendum)
Anesthesia Evaluation  ?Patient identified by MRN, date of birth, ID band ?Patient awake ? ? ? ?Reviewed: ?Allergy & Precautions, NPO status , Patient's Chart, lab work & pertinent test results ? ?Airway ?Mallampati: III ? ?TM Distance: >3 FB ?Neck ROM: full ? ? ? Dental ? ?(+) Chipped, Poor Dentition, Missing ?  ?Pulmonary ?neg pulmonary ROS, Current Smoker and Patient abstained from smoking.,  ?  ?Pulmonary exam normal ?breath sounds clear to auscultation ? ? ? ? ? ? Cardiovascular ?Exercise Tolerance: Good ?(-) Past MI negative cardio ROS ?Normal cardiovascular exam ?Rhythm:Regular Rate:Normal ? ? ?  ?Neuro/Psych ?Seizures -,  Anxiety Depression negative neurological ROS ? negative psych ROS  ? GI/Hepatic ?negative GI ROS, Neg liver ROS,   ?Endo/Other  ?negative endocrine ROS ? Renal/GU ?negative Renal ROS  ?negative genitourinary ?  ?Musculoskeletal ? ? Abdominal ?Normal abdominal exam  (+)   ?Peds ?negative pediatric ROS ?(+)  Hematology ?negative hematology ROS ?(+) Blood dyscrasia, anemia ,   ?Anesthesia Other Findings ?Past Medical History: ?No date: Bipolar 1 disorder ([place]) ?No date: Colon cancer [facility]) ?No date: PTSD (post-traumatic stress disorder) ? ?Past Surgical History: ?07/12/2021: COLON RESECTION; N/A ?    Comment:  Procedure: HAND ASSISTED LAPAROSCOPIC COLON RESECTION;   ?             Surgeon: Jules Husbands, MD;  Location: [facility] ORS;   ?             Service: General;  Laterality: N/A; ?07/06/2021: COLONOSCOPY WITH PROPOFOL; N/A ?    Comment:  Procedure: COLONOSCOPY WITH PROPOFOL;  Surgeon: Marius Ditch,  ?             Tally Due, MD;  Location: [facility] ENDOSCOPY;  Service:  ?             Gastroenterology;  Laterality: N/A; ?07/07/2021: COLONOSCOPY WITH PROPOFOL; N/A ?    Comment:  Procedure: COLONOSCOPY WITH PROPOFOL;  Surgeon: Marius Ditch,  ?             Tally Due, MD;  Location: [facility] ENDOSCOPY;  Service:  ?             Gastroenterology;  Laterality: N/A; ?07/06/2021:  ESOPHAGOGASTRODUODENOSCOPY (EGD) WITH PROPOFOL; N/A ?    Comment:  Procedure: ESOPHAGOGASTRODUODENOSCOPY (EGD) WITH  ?             PROPOFOL;  Surgeon: Lin Landsman, MD;  Location:  ?             [facility] ENDOSCOPY;  Service: Gastroenterology;  Laterality:  ?             N/A; ?07/06/2021: GIVENS CAPSULE STUDY; N/A ?    Comment:  Procedure: GIVENS CAPSULE STUDY;  Surgeon: Sherri Sear ?             Reece Levy, MD;  Location: [facility];  Service:  ?             Gastroenterology;  Laterality: N/A; ? ? ? ? Reproductive/Obstetrics ?negative OB ROS ? ?  ? ? ? ? ? ? ? ? ? ? ? ? ? ?  ?  ? ? ? ? ? ? ? ?Anesthesia Physical ?Anesthesia Plan ? ?ASA: 3 ? ?Anesthesia Plan: General  ? ?Post-op Pain Management:   ? ?Induction: Intravenous ? ?PONV Risk Score and Plan: Propofol infusion and TIVA ? ?Airway Management Planned: LMA ? ?Additional Equipment:  ? ?Intra-op Plan:  ? ?Post-operative Plan:  ? ?Informed Consent: I have reviewed the patients  History and Physical, chart, labs and discussed the procedure including the risks, benefits and alternatives for the proposed anesthesia with the patient or authorized representative who has indicated his/her understanding and acceptance.  ? ? ? ?Dental Advisory Given ? ?Plan Discussed with: Anesthesiologist, CRNA and Surgeon ? ?Anesthesia Plan Comments: (History and phone consent from the patients Legal Guardian Jontae Joe at [phone] ? ?She consented for risks of anesthesia including but not limited to:  ?- adverse reactions to medications ?- risk of airway placement if required ?- damage to eyes, teeth, lips or other oral mucosa ?- nerve damage due to positioning  ?- sore throat or hoarseness ?- Damage to heart, brain, nerves, lungs, other parts of body or loss of life ? ?She voiced understanding.)  ? ? ? ? ? ?Anesthesia Quick Evaluation ? ?

## 2021-07-21 NOTE — Progress Notes (Signed)
? ? ? ?Hematology/Oncology Consult note ?[place]  ?[phone] [phone] ? ?Patient Care Team: ?[name], MD as PCP - General (Internal Medicine) ?Sindy Guadeloupe, MD as Consulting Physician (Hematology and Oncology) ?Jules Husbands, MD as Consulting Physician (General Surgery) ?Lin Landsman, MD as Consulting Physician (Gastroenterology)  ? ?Name of the patient: Connor Wilkinson  ?[id number]  ?08-06-1985  ? ?Date of visit: 07/21/21 ? ?Diagnosis-stage IIIc adenocarcinoma of the colon [id number] ? ?Chief complaint/ Reason for visit-discuss final pathology results and further management ? ?Heme/Onc history: patient is a 36 year old male with a past medical history significant for mild bipolar disorder who is a ward of the stateAnd was admitted to the hospital for severe anemia.  He was found to have a hemoglobin of 4.5/18.7 on admission.  He received blood transfusion as well as IV iron and his hemoglobin is presently up to an 8.  He underwent colonoscopy which showed malignant partially obstructing tumor that was circumferential 80 cm proximal to the anus.  Biopsy consistent with moderately differentiated adenocarcinoma with mucinous features.Baseline CEA is currently pending.  CT chest abdomen and pelvis with contrast did not show any evidence of distant metastatic disease.  2 small sclerotic densities in the acetabulum and proximal right femur likely benign process.  Pathologically enlarged lymph nodes superior to circumferential colonic lesion suggesting metastatic adenopathy. ?  ?Patient underwent right hemicolectomy with Dr. Adora Fridge on 07/12/2021.  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  Vermiform appendix negative for tumor.  Radial margin positive for tumor (2 lymph nodes at margin with adenocarcinoma).  Proximal and distal  margins negative.  7 out of 56 lymph nodes positive for metastatic adenocarcinoma.  Tumor deposits not applicable.  No lymphovascular or perineural invasion seen.  [id number] ? ?Interval history-patient reports doing well postsurgery.  He still has surgical staples in place.  Bowel movements are regular.  Energy levels are improving. ? ?ECOG PS- 0 ?Pain scale- 0 ? ? ?Review of systems- Review of Systems  ?Constitutional:  Positive for malaise/fatigue. Negative for chills, fever and weight loss.  ?HENT:  Negative for congestion, ear discharge and nosebleeds.   ?Eyes:  Negative for blurred vision.  ?Respiratory:  Negative for cough, hemoptysis, sputum production, shortness of breath and wheezing.   ?Cardiovascular:  Negative for chest pain, palpitations, orthopnea and claudication.  ?Gastrointestinal:  Negative for abdominal pain, blood in stool, constipation, diarrhea, heartburn, melena, nausea and vomiting.  ?Genitourinary:  Negative for dysuria, flank pain, frequency, hematuria and urgency.  ?Musculoskeletal:  Negative for back pain, joint pain and myalgias.  ?Skin:  Negative for rash.  ?Neurological:  Negative for dizziness, tingling, focal weakness, seizures, weakness and headaches.  ?Endo/Heme/Allergies:  Does not bruise/bleed easily.  ?Psychiatric/Behavioral:  Negative for depression and suicidal ideas. The patient does not have insomnia.    ? ? ? ?Allergies  ?Allergen Reactions  ? Geodon [Ziprasidone Hcl] Anaphylaxis  ? ? ? ?Past Medical History:  ?Diagnosis Date  ? Bipolar 1 disorder ([place])   ? Colon cancer ([place])   ? PTSD (post-traumatic stress disorder)   ? ? ? ?Past Surgical History:  ?Procedure Laterality Date  ? COLON RESECTION N/A 07/12/2021  ? Procedure: HAND ASSISTED LAPAROSCOPIC COLON RESECTION;  Surgeon: Jules Husbands, MD;  Location: [facility] ORS;  Service: General;  Laterality: N/A;  ? COLONOSCOPY WITH PROPOFOL N/A 07/06/2021  ? Procedure: COLONOSCOPY WITH PROPOFOL;  Surgeon: Lin Landsman, MD;   Location: [facility] ENDOSCOPY;  Service: Gastroenterology;  Laterality: N/A;  ? COLONOSCOPY WITH PROPOFOL N/A 07/07/2021  ? Procedure: COLONOSCOPY WITH PROPOFOL;  Surgeon: Lin Landsman, MD;  Location: [facility] ENDOSCOPY;  Service: Gastroenterology;  Laterality: N/A;  ? ESOPHAGOGASTRODUODENOSCOPY (EGD) WITH PROPOFOL N/A 07/06/2021  ? Procedure: ESOPHAGOGASTRODUODENOSCOPY (EGD) WITH PROPOFOL;  Surgeon: Lin Landsman, MD;  Location: [facility] ENDOSCOPY;  Service: Gastroenterology;  Laterality: N/A;  ? GIVENS CAPSULE STUDY N/A 07/06/2021  ? Procedure: GIVENS CAPSULE STUDY;  Surgeon: Lin Landsman, MD;  Location: [facility] ENDOSCOPY;  Service: Gastroenterology;  Laterality: N/A;  ? ? ?Social History  ? ?Socioeconomic History  ? Marital status: Single  ?  Spouse name: Not on file  ? Number of children: Not on file  ? Years of education: Not on file  ? Highest education level: Not on file  ?Occupational History  ? Not on file  ?Tobacco Use  ? Smoking status: Every Day  ? Smokeless tobacco: Never  ?Vaping Use  ? Vaping Use: Every day  ?Substance and Sexual Activity  ? Alcohol use: Not Currently  ? Drug use: Not Currently  ? Sexual activity: Not on file  ?Other Topics Concern  ? Not on file  ?Social History Narrative  ? Not on file  ? ?Social Determinants of Health  ? ?Financial Resource Strain: Not on file  ?Food Insecurity: Not on file  ?Transportation Needs: Not on file  ?Physical Activity: Not on file  ?Stress: Not on file  ?Social Connections: Not on file  ?Intimate Partner Violence: Not on file  ? ? ?History reviewed. No pertinent family history. ? ? ?Current Outpatient Medications:  ?  ARIPiprazole ER (ABILIFY MAINTENA) 400 MG PRSY prefilled syringe, Inject 400 mg into the muscle every 21 ( twenty-one) days., Disp: , Rfl:  ?  benztropine (COGENTIN) 2 MG tablet, Take 2 mg by mouth at bedtime., Disp: , Rfl:  ?  busPIRone (BUSPAR) 10 MG tablet, Take 10 mg by mouth 2 (two) times daily., Disp: , Rfl:  ?  FLUoxetine (PROZAC)  20 MG capsule, Take 20 mg by mouth daily., Disp: , Rfl:  ?  ibuprofen (ADVIL) 600 MG tablet, Take 1 tablet (600 mg total) by mouth every 6 (six) hours as needed., Disp: 30 tablet, Rfl: 0 ?  oxyCODONE (OXY IR/ROXICODONE) 5 MG immediate release tablet, Take 1 tablet (5 mg total) by mouth every 6 (six) hours as needed for severe pain or breakthrough pain., Disp: 25 tablet, Rfl: 0 ?  polyethylene glycol (MIRALAX / GLYCOLAX) 17 g packet, Take 17 g by mouth daily as needed (constipation)., Disp: 14 each, Rfl: 0 ?  prazosin (MINIPRESS) 2 MG capsule, Take 2 mg by mouth 2 (two) times daily., Disp: , Rfl:  ?  ARIPiprazole (ABILIFY) 10 MG tablet, Take 10 mg by mouth daily. (Patient not taking: Reported on 07/20/2021), Disp: , Rfl:  ?  dexamethasone (DECADRON) 4 MG tablet, Take 2 tablets (8 mg total) by mouth daily. Start the day after chemotherapy for 2 days. Take with food., Disp: 30 tablet, Rfl: 1 ?  lidocaine-prilocaine (EMLA) cream, Apply to affected area once, Disp: 30 g, Rfl: 3 ?  ondansetron (ZOFRAN) 8 MG tablet, Take 1 tablet (8 mg total) by mouth 2 (two) times daily as needed for refractory nausea / vomiting. Start on day 3 after chemotherapy., Disp: 30 tablet, Rfl: 1 ?  prochlorperazine (COMPAZINE) 10 MG tablet, Take 1 tablet (10 mg total) by mouth every 6 (  six) hours as needed (Nausea or vomiting)., Disp: 30 tablet, Rfl: 1 ? ?Physical exam:  ?Vitals:  ? 07/20/21 1113  ?BP: 122/72  ?Pulse: 71  ?Resp: 16  ?Temp: 97.7 ?F (36.5 ?C)  ?Weight: 164 lb 8 oz (74.6 kg)  ? ?Physical Exam ?Constitutional:   ?   General: He is not in acute distress. ?Eyes:  ?   Pupils: Pupils are equal, round, and reactive to light.  ?Cardiovascular:  ?   Rate and Rhythm: Normal rate and regular rhythm.  ?   Heart sounds: Normal heart sounds.  ?Pulmonary:  ?   Effort: Pulmonary effort is normal.  ?   Breath sounds: Normal breath sounds.  ?Abdominal:  ?   General: Bowel sounds are normal.  ?   Palpations: Abdomen is soft.  ?   Comments: Midline  surgical staples in place  ?Skin: ?   General: Skin is warm and dry.  ?Neurological:  ?   Mental Status: He is alert and oriented to person, place, and time.  ?  ? ? ?  Latest Ref Rng & Units 07/14/2021  ?  7:05 AM

## 2021-07-22 ENCOUNTER — Ambulatory Visit
Admission: RE | Admit: 2021-07-22 | Discharge: 2021-07-22 | Disposition: A | Discharge status: Home / Self Care | Payer: Medicaid Other | Source: Ambulatory Visit | Attending: Surgery | Admitting: Surgery

## 2021-07-22 ENCOUNTER — Other Ambulatory Visit: Payer: Medicaid Other

## 2021-07-22 ENCOUNTER — Ambulatory Visit: Payer: Medicaid Other | Admitting: Anesthesiology

## 2021-07-22 ENCOUNTER — Encounter: Payer: Self-pay | Admitting: Surgery

## 2021-07-22 ENCOUNTER — Other Ambulatory Visit: Payer: Self-pay

## 2021-07-22 ENCOUNTER — Ambulatory Visit: Payer: Medicaid Other

## 2021-07-22 ENCOUNTER — Encounter
Admission: RE | Disposition: A | Discharge status: Home / Self Care | Payer: Self-pay | Source: Ambulatory Visit | Attending: Surgery

## 2021-07-22 DIAGNOSIS — Z452 Encounter for adjustment and management of vascular access device: Secondary | ICD-10-CM | POA: Diagnosis not present

## 2021-07-22 DIAGNOSIS — C189 Malignant neoplasm of colon, unspecified: Secondary | ICD-10-CM

## 2021-07-22 DIAGNOSIS — F319 Bipolar disorder, unspecified: Secondary | ICD-10-CM | POA: Diagnosis not present

## 2021-07-22 DIAGNOSIS — C184 Malignant neoplasm of transverse colon: Secondary | ICD-10-CM

## 2021-07-22 DIAGNOSIS — F17200 Nicotine dependence, unspecified, uncomplicated: Secondary | ICD-10-CM | POA: Diagnosis not present

## 2021-07-22 DIAGNOSIS — F419 Anxiety disorder, unspecified: Secondary | ICD-10-CM | POA: Insufficient documentation

## 2021-07-22 HISTORY — PX: PORTACATH PLACEMENT: SHX2246

## 2021-07-22 SURGERY — INSERTION, TUNNELED CENTRAL VENOUS DEVICE, WITH PORT
Anesthesia: General | Site: Chest | Laterality: Right

## 2021-07-22 MED ORDER — DEXAMETHASONE SODIUM PHOSPHATE 10 MG/ML IJ SOLN
INTRAMUSCULAR | Status: AC
  Filled 2021-07-22: qty 1

## 2021-07-22 MED ORDER — PROPOFOL 10 MG/ML IV BOLUS
INTRAVENOUS | Status: DC | PRN
  Administered 2021-07-22: 200 mg via INTRAVENOUS

## 2021-07-22 MED ORDER — GABAPENTIN 300 MG PO CAPS: 300.0000 mg | ORAL_CAPSULE | ORAL | Status: AC

## 2021-07-22 MED ORDER — CHLORHEXIDINE GLUCONATE 0.12 % MT SOLN: 15.0000 mL | Freq: Once | OROMUCOSAL | Status: AC

## 2021-07-22 MED ORDER — SODIUM CHLORIDE 0.9 % IV SOLN
INTRAVENOUS | Status: DC | PRN
  Administered 2021-07-22: 5 mL

## 2021-07-22 MED ORDER — HEPARIN SODIUM (PORCINE) 5000 UNIT/ML IJ SOLN
INTRAMUSCULAR | Status: AC
  Filled 2021-07-22: qty 1

## 2021-07-22 MED ORDER — ONDANSETRON HCL 4 MG/2ML IJ SOLN
INTRAMUSCULAR | Status: AC
  Filled 2021-07-22: qty 2

## 2021-07-22 MED ORDER — CEFAZOLIN SODIUM-DEXTROSE 2-4 GM/100ML-% IV SOLN
INTRAVENOUS | Status: AC
  Filled 2021-07-22: qty 100

## 2021-07-22 MED ORDER — ONDANSETRON HCL 4 MG/2ML IJ SOLN: 4.0000 mg | Freq: Once | INTRAMUSCULAR | Status: DC | PRN

## 2021-07-22 MED ORDER — LIDOCAINE HCL (PF) 1 % IJ SOLN
INTRAMUSCULAR | Status: AC
  Filled 2021-07-22: qty 30

## 2021-07-22 MED ORDER — PHENYLEPHRINE 40 MCG/ML (10ML) SYRINGE FOR IV PUSH (FOR BLOOD PRESSURE SUPPORT)
PREFILLED_SYRINGE | INTRAVENOUS | Status: AC
Start: 2021-07-22 — End: ?
  Filled 2021-07-22: qty 10

## 2021-07-22 MED ORDER — EPHEDRINE SULFATE (PRESSORS) 50 MG/ML IJ SOLN
INTRAMUSCULAR | Status: DC | PRN
  Administered 2021-07-22: 10 mg via INTRAVENOUS
  Administered 2021-07-22 (×2): 5 mg via INTRAVENOUS

## 2021-07-22 MED ORDER — EPHEDRINE 5 MG/ML INJ
INTRAVENOUS | Status: AC
  Filled 2021-07-22: qty 5

## 2021-07-22 MED ORDER — LIDOCAINE HCL 1 % IJ SOLN
INTRAMUSCULAR | Status: DC | PRN
  Administered 2021-07-22: 11 mL via INTRAMUSCULAR

## 2021-07-22 MED ORDER — PROPOFOL 10 MG/ML IV BOLUS
INTRAVENOUS | Status: AC
  Filled 2021-07-22: qty 20

## 2021-07-22 MED ORDER — CHLORHEXIDINE GLUCONATE CLOTH 2 % EX PADS: 6.0000 | MEDICATED_PAD | Freq: Once | CUTANEOUS | Status: DC

## 2021-07-22 MED ORDER — CEFAZOLIN SODIUM-DEXTROSE 2-4 GM/100ML-% IV SOLN
2.0000 g | INTRAVENOUS | Status: AC
  Administered 2021-07-22: 2 g via INTRAVENOUS

## 2021-07-22 MED ORDER — PHENYLEPHRINE HCL (PRESSORS) 10 MG/ML IV SOLN
INTRAVENOUS | Status: DC | PRN
  Administered 2021-07-22 (×2): 80 ug via INTRAVENOUS

## 2021-07-22 MED ORDER — LIDOCAINE HCL (PF) 2 % IJ SOLN
INTRAMUSCULAR | Status: AC
  Filled 2021-07-22: qty 5

## 2021-07-22 MED ORDER — BUPIVACAINE-EPINEPHRINE (PF) 0.25% -1:200000 IJ SOLN
INTRAMUSCULAR | Status: AC
  Filled 2021-07-22: qty 30

## 2021-07-22 MED ORDER — SODIUM CHLORIDE FLUSH 0.9 % IV SOLN
INTRAVENOUS | Status: AC
  Filled 2021-07-22: qty 10

## 2021-07-22 MED ORDER — FAMOTIDINE 20 MG PO TABS
ORAL_TABLET | ORAL | Status: AC
  Administered 2021-07-22: 20 mg via ORAL
  Filled 2021-07-22: qty 1

## 2021-07-22 MED ORDER — ORAL CARE MOUTH RINSE: 15.0000 mL | Freq: Once | OROMUCOSAL | Status: AC

## 2021-07-22 MED ORDER — FENTANYL CITRATE (PF) 100 MCG/2ML IJ SOLN
INTRAMUSCULAR | Status: AC
Start: 2021-07-22 — End: ?
  Filled 2021-07-22: qty 2

## 2021-07-22 MED ORDER — MIDAZOLAM HCL 2 MG/2ML IJ SOLN
INTRAMUSCULAR | Status: AC
  Filled 2021-07-22: qty 2

## 2021-07-22 MED ORDER — FAMOTIDINE 20 MG PO TABS: 20.0000 mg | ORAL_TABLET | Freq: Every day | ORAL | Status: DC

## 2021-07-22 MED ORDER — FENTANYL CITRATE (PF) 100 MCG/2ML IJ SOLN: 25.0000 ug | INTRAMUSCULAR | Status: DC | PRN

## 2021-07-22 MED ORDER — ONDANSETRON HCL 4 MG/2ML IJ SOLN
INTRAMUSCULAR | Status: DC | PRN
  Administered 2021-07-22: 4 mg via INTRAVENOUS

## 2021-07-22 MED ORDER — ACETAMINOPHEN 500 MG PO TABS: 1000.0000 mg | ORAL_TABLET | ORAL | Status: AC

## 2021-07-22 MED ORDER — DEXAMETHASONE SODIUM PHOSPHATE 10 MG/ML IJ SOLN
INTRAMUSCULAR | Status: DC | PRN
  Administered 2021-07-22: 10 mg via INTRAVENOUS

## 2021-07-22 MED ORDER — ACETAMINOPHEN 500 MG PO TABS
ORAL_TABLET | ORAL | Status: AC
  Administered 2021-07-22: 1000 mg via ORAL
  Filled 2021-07-22: qty 2

## 2021-07-22 MED ORDER — LACTATED RINGERS IV SOLN
INTRAVENOUS | Status: DC
Start: 2021-07-22 — End: 2021-07-22

## 2021-07-22 MED ORDER — SODIUM CHLORIDE 0.9 % IV SOLN
INTRAVENOUS | Status: AC | PRN
  Administered 2021-07-22: 500 mL via INTRAMUSCULAR

## 2021-07-22 MED ORDER — LIDOCAINE HCL (CARDIAC) PF 100 MG/5ML IV SOSY
PREFILLED_SYRINGE | INTRAVENOUS | Status: DC | PRN
Start: 2021-07-22 — End: 2021-07-22
  Administered 2021-07-22: 100 mg via INTRAVENOUS

## 2021-07-22 MED ORDER — FENTANYL CITRATE (PF) 100 MCG/2ML IJ SOLN
INTRAMUSCULAR | Status: DC | PRN
  Administered 2021-07-22 (×2): 50 ug via INTRAVENOUS

## 2021-07-22 MED ORDER — MIDAZOLAM HCL 2 MG/2ML IJ SOLN
INTRAMUSCULAR | Status: DC | PRN
  Administered 2021-07-22: 2 mg via INTRAVENOUS

## 2021-07-22 MED ORDER — GABAPENTIN 300 MG PO CAPS
ORAL_CAPSULE | ORAL | Status: AC
  Administered 2021-07-22: 300 mg via ORAL
  Filled 2021-07-22: qty 1

## 2021-07-22 MED ORDER — CHLORHEXIDINE GLUCONATE 0.12 % MT SOLN
OROMUCOSAL | Status: AC
  Administered 2021-07-22: 15 mL via OROMUCOSAL
  Filled 2021-07-22: qty 15

## 2021-07-22 SURGICAL SUPPLY — 38 items
ADH SKN CLS APL DERMABOND .7 (GAUZE/BANDAGES/DRESSINGS) ×1
APL PRP STRL LF DISP 70% ISPRP (MISCELLANEOUS) ×1
BAG DECANTER FOR FLEXI CONT (MISCELLANEOUS) ×4 IMPLANT
BLADE SURG SZ11 CARB STEEL (BLADE) ×2 IMPLANT
BOOT SUTURE AID YELLOW STND (SUTURE) ×2 IMPLANT
CHLORAPREP W/TINT 26 (MISCELLANEOUS) ×2 IMPLANT
COVER LIGHT HANDLE STERIS (MISCELLANEOUS) ×4 IMPLANT
DERMABOND ADVANCED (GAUZE/BANDAGES/DRESSINGS) ×1
DERMABOND ADVANCED .7 DNX12 (GAUZE/BANDAGES/DRESSINGS) ×1 IMPLANT
DRAPE 3/4 80X56 (DRAPES) ×2 IMPLANT
DRAPE C-ARM XRAY 36X54 (DRAPES) ×4 IMPLANT
DRAPE INCISE IOBAN 66X45 STRL (DRAPES) ×2 IMPLANT
ELECT CAUTERY BLADE 6.4 (BLADE) ×2 IMPLANT
ELECT REM PT RETURN 9FT ADLT (ELECTROSURGICAL) ×2
ELECTRODE REM PT RTRN 9FT ADLT (ELECTROSURGICAL) ×1 IMPLANT
GAUZE 4X4 16PLY ~~LOC~~+RFID DBL (SPONGE) ×2 IMPLANT
GEL ULTRASOUND 20GR AQUASONIC (MISCELLANEOUS) ×2 IMPLANT
GLOVE SURG ENC MOIS LTX SZ7 (GLOVE) ×2 IMPLANT
GOWN STRL REUS W/ TWL LRG LVL3 (GOWN DISPOSABLE) ×2 IMPLANT
GOWN STRL REUS W/TWL LRG LVL3 (GOWN DISPOSABLE) ×4
IV NS 500ML (IV SOLUTION) ×2
IV NS 500ML BAXH (IV SOLUTION) ×1 IMPLANT
KIT PORT POWER 8FR ISP CVUE (Port) ×2 IMPLANT
MANIFOLD NEPTUNE II (INSTRUMENTS) ×2 IMPLANT
NEEDLE HYPO 22GX1.5 SAFETY (NEEDLE) ×2 IMPLANT
PACK PORT-A-CATH (MISCELLANEOUS) ×2 IMPLANT
SPONGE T-LAP 18X18 ~~LOC~~+RFID (SPONGE) ×2 IMPLANT
SUT MNCRL AB 4-0 PS2 18 (SUTURE) ×2 IMPLANT
SUT PROLENE 2-0 (SUTURE) ×2
SUT PROLENE 2-0 RB1 36X2 ARM (SUTURE) ×1
SUT VIC AB 3-0 SH 27 (SUTURE) ×2
SUT VIC AB 3-0 SH 27X BRD (SUTURE) ×1 IMPLANT
SUTURE PROLEN 2-0 RB1 36X2 ARM (SUTURE) ×1 IMPLANT
SYR 10ML LL (SYRINGE) ×2 IMPLANT
SYR 20ML LL LF (SYRINGE) ×2 IMPLANT
SYR 5ML LL (SYRINGE) ×2 IMPLANT
TOWEL OR 17X26 4PK STRL BLUE (TOWEL DISPOSABLE) ×2 IMPLANT
WATER STERILE IRR 500ML POUR (IV SOLUTION) ×2 IMPLANT

## 2021-07-22 NOTE — Progress Notes (Signed)
Patient awake/alert x4. Tolerated gingerale and crackers without event, no c/o's pain. ?Voided 662m while in pacu ?Dr. PDahlia Byesremoved staples from abd from previous surgery, steri strips in place, tolerated without event, site c/d ?

## 2021-07-22 NOTE — Interval H&P Note (Signed)
History and Physical Interval Note: ? ?07/22/2021 ?9:49 AM ? ?Connor Wilkinson  has presented today for surgery, with the diagnosis of adenocarcinoma colon.  The various methods of treatment have been discussed with the patient and POA. After consideration of risks, benefits and other options for treatment, the patient has consented to  Procedure(s): ?INSERTION PORT-A-CATH (N/A) as a surgical intervention.  The patient's history has been reviewed, patient examined, no change in status, stable for surgery.  I have reviewed the patient's chart and labs.  Questions were answered to the patient's satisfaction.   ? ? ?[place] ? ? ?

## 2021-07-22 NOTE — Transfer of Care (Signed)
Immediate Anesthesia Transfer of Care Note ? ?Patient: Connor Wilkinson ? ?Procedure(s) Performed: INSERTION PORT-A-CATH (Right: Chest) ? ?Patient Location: PACU ? ?Anesthesia Type: General ? ?Level of Consciousness: awake, alert  and oriented ? ?Airway & Oxygen Therapy: Patient Spontanous Breathing ? ?Post-op Assessment: Report given to RN and Post -op Vital signs reviewed and stable ? ?Post vital signs: Reviewed and stable ? ?Last Vitals:  ?Vitals Value Taken Time  ?BP 120/65 07/22/21 1123  ?Temp 36.2 ?C 07/22/21 1123  ?Pulse 74 07/22/21 1126  ?Resp 13 07/22/21 1125  ?SpO2 100 % 07/22/21 1126  ?Vitals shown include unvalidated device data. ? ?Last Pain:  ?Vitals:  ? 07/22/21 1123  ?TempSrc:   ?PainSc: 0-No pain  ?   ? ?  ? ?Complications: No notable events documented. ?

## 2021-07-22 NOTE — Anesthesia Procedure Notes (Signed)
Procedure Name: LMA Insertion ?Date/Time: 07/22/2021 10:07 AM ?Performed by: Hedda Slade, CRNA ?Pre-anesthesia Checklist: Patient identified, Patient being monitored, Timeout performed, Emergency Drugs available and Suction available ?Patient Re-evaluated:Patient Re-evaluated prior to induction ?Oxygen Delivery Method: Circle system utilized ?Preoxygenation: Pre-oxygenation with 100% oxygen ?Induction Type: IV induction ?Ventilation: Mask ventilation without difficulty ?LMA: LMA inserted ?LMA Size: 4.0 ?Tube type: Oral ?Number of attempts: 1 ?Placement Confirmation: positive ETCO2 and breath sounds checked- equal and bilateral ?Tube secured with: Tape ?Dental Injury: Teeth and Oropharynx as per pre-operative assessment  ? ? ? ? ?

## 2021-07-22 NOTE — Anesthesia Postprocedure Evaluation (Signed)
Anesthesia Post Note ? ?Patient: Connor Wilkinson ? ?Procedure(s) Performed: INSERTION PORT-A-CATH (Right: Chest) ? ?Patient location during evaluation: PACU ?Anesthesia Type: General ?Level of consciousness: awake and oriented ?Pain management: pain level controlled ?Vital Signs Assessment: post-procedure vital signs reviewed and stable ?Respiratory status: spontaneous breathing and nonlabored ventilation ?Cardiovascular status: stable ?Anesthetic complications: no ? ? ?No notable events documented. ? ? ?Last Vitals:  ?Vitals:  ? 07/22/21 1123 07/22/21 1203  ?BP: 120/65 122/68  ?Pulse: 78 81  ?Resp: 20 15  ?Temp: (!) 36.2 ?C (!) 36.4 ?C  ?SpO2: 100% 100%  ?  ?Last Pain:  ?Vitals:  ? 07/22/21 1203  ?TempSrc: Temporal  ?PainSc: 0-No pain  ? ? ?  ?  ?  ?  ?  ?  ? ?VAN STAVEREN,Caeley Dohrmann ? ? ? ? ?

## 2021-07-22 NOTE — Op Note (Signed)
?  Pre-operative Diagnosis:Colon CA ? ?Post-operative Diagnosis: same ? ? ?Surgeon: Caroleen Hamman, MD FACS ? ?Anesthesia: General LMA, marcaine .25% w epi and lidocaine 1% ? ?Procedure: right IJ  Port placement with fluoroscopy under U/S guidance ? ?Findings: ?Good position of the tip of the catheter by fluoroscopy ? ?Estimated Blood Loss: Minimal ?        ?Drains: None ?        ?Specimens: None ?      ?Complications: none ?        ? ?Procedure Details  ?The patient was seen again in the Holding Room. The benefits, complications, treatment options, and expected outcomes were discussed with the patient. The risks of bleeding, infection, recurrence of symptoms, failure to resolve symptoms,  thrombosis nonfunction breakage pneumothorax hemopneumothorax any of which could require chest tube or further surgery were reviewed with the patient.   The patient was taken to Operating Room, identified as [place] Fick and the procedure verified.  A Time Out was held and the above information confirmed. ? ?Prior to the induction of general anesthesia, antibiotic prophylaxis was administered. VTE prophylaxis was in place. Appropriate anesthesia was then administered and tolerated well. The chest was prepped with Chloraprep and draped in the sterile fashion. The patient was positioned in the supine position. Then the patient was placed in Trendelenburg position. ? ?Patient was prepped and draped in sterile fashion and in a Trendelenburg position local anesthetic was infiltrated into the skin and subcutaneous tissues in the neck and anterior chest wall. The large bore needle was placed into the internal jugular vein under U/S guidance without difficulty and then the Seldinger wire was advanced. Fluoroscopy was utilized to confirm that the Seldinger wire was in the superior vena cava. ? ?An incision was made and a port pocket developed with blunt and electrocautery dissection. The introducer dilator was placed over the Seldinger  wire the wire was removed. The previously flushed catheter was placed into the introducer dilator and the peel-away sheath was removed. The catheter length was confirmed and trimmed utilizing fluoroscopy for proper positioning. The catheter was then attached to the previously flushed port. The port was placed into the pocket. The port was held in with 2-0 Prolenes and flushed for function and heparin locked. ? ?The wound was closed with interrupted 3-0 Vicryl followed by 4-0 subcuticular Monocryl sutures. Dermabond used to coat the skin ? ?Patient was taken to the recovery room in stable condition where a postoperative chest film has been ordered. ? ?  ?

## 2021-07-22 NOTE — Discharge Instructions (Signed)

## 2021-07-25 ENCOUNTER — Encounter: Payer: Self-pay | Admitting: Surgery

## 2021-07-26 ENCOUNTER — Encounter: Payer: Self-pay | Admitting: Physician Assistant

## 2021-07-26 ENCOUNTER — Ambulatory Visit (INDEPENDENT_AMBULATORY_CARE_PROVIDER_SITE_OTHER): Payer: Medicaid Other | Admitting: Physician Assistant

## 2021-07-26 VITALS — BP 120/69 | HR 83 | Temp 98.5°F | Ht 72.0 in | Wt 167.4 lb

## 2021-07-26 DIAGNOSIS — Z09 Encounter for follow-up examination after completed treatment for conditions other than malignant neoplasm: Secondary | ICD-10-CM

## 2021-07-26 DIAGNOSIS — C184 Malignant neoplasm of transverse colon: Secondary | ICD-10-CM

## 2021-07-26 NOTE — Patient Instructions (Signed)
Please pack your wound daily and you may take it off to shower.  ? ?If you have any concerns or questions, please feel free to call our office. See follow up appointment below.  ? ?Minimally Invasive Small Bowel Resection, Care After ?This sheet gives you information about how to care for yourself after your procedure. Your health care provider may also give you more specific instructions. If you have problems or questions, contact your health care provider. ?What can I expect after the procedure? ?After the procedure, it is common to have: ?Pain in your abdomen, especially in the incision areas. You will be given pain medicines to control this. ?Tiredness. This is a normal part of the recovery process. Your energy level will return to normal over the next several weeks. ?Constipation. You may be given a stool softener to help prevent this. ?Follow these instructions at home: ?Medicines ?Take over-the-counter and prescription medicines only as told by your health care provider. ?Ask your health care provider if the medicine prescribed to you requires you to avoid driving or using heavy machinery. ?If you were prescribed an antibiotic medicine, use it as told by your health care provider. Do not stop using the antibiotic even if you start to feel better. ?Activity ?Return to your normal activities as told by your health care provider. Ask your health care provider what activities are safe for you. ?Do not lift anything that is heavier than 10 lb (4.5 kg), or the limit that you are told, for 4 weeks or until your health care provider says that it is safe. ?Rest as told by your health care provider. ?Avoid sitting for a long time without moving. Get up to take short walks every 1-2 hours. This is important to improve blood flow and breathing. Ask for help if you feel weak or unsteady. ?Avoid activities that take a lot of effort, such as running or aerobics, for at least 6 weeks or until your health care provider  approves. ?Do not drive until your health care provider approves. ?Incision care ? ?Follow instructions from your health care provider about how to take care of your incision areas. Make sure you: ?Keep your incisions clean and dry. ?Wash your hands with soap and water before and after you change your bandage (dressing). If soap and water are not available, use hand sanitizer. ?Change your dressing as told by your health care provider. ?Leave stitches (sutures), skin glue, or adhesive strips in place. These skin closures may need to stay in place for 2 weeks or longer. If adhesive strip edges start to loosen and curl up, you may trim the loose edges. Do not remove adhesive strips completely unless your health care provider tells you to do that. ?Check your incision areas every day for signs of infection. Check for: ?Redness, swelling, or pain. ?Fluid or blood. ?Warmth. ?Pus or a bad smell. ?Do not take baths, swim, or use a hot tub until your health care provider approves. Ask your health care provider if you may take showers. You may only be allowed to take sponge baths. ?Managing constipation ?You may need to take these actions to prevent or treat constipation: ?Drink enough fluid to keep your urine pale yellow. ?Take over-the-counter or prescription medicines. ?Eat foods that are high in fiber, such as beans, whole grains, and fresh fruits and vegetables. ?Limit foods that are high in fat and processed sugars, such as fried or sweet foods. ? ?General instructions ?You may eat what you usually do. ?Wear  compression stockings as told by your health care provider. These stockings help to prevent blood clots and reduce swelling in your legs. ?Do not use any products that contain nicotine or tobacco, such as cigarettes, e-cigarettes, and chewing tobacco. These can delay incision healing after surgery. If you need help quitting, ask your health care provider. ?Keep all follow-up visits as told by your health care  provider. This is important. ?Contact a health care provider if: ?You have pain that is not relieved with medicine. ?You do not feel like eating. ?You feel nauseous or you vomit. ?You have constipation that is not relieved with prescribed stool softeners. ?You have a fever or chills. ?You have any of these signs of infection: ?Redness, swelling, or pain in the incision areas. ?Fluid or blood coming from your incisions. ?Warmth coming from your incisions. ?Pus or a bad smell coming from the incision areas. ?Your incisions break open. ?You have bleeding from the rectum. ?You have swelling of your abdomen. ?Get help right away if: ?Your pain gets worse, even after you take pain medicine. ?Your legs or arms become painful, red, or swollen. ?You have chest pain. ?You have trouble breathing. ?You have persistent nausea or vomiting. ?You have increased abdominal pain. ?You cannot have a bowel movement or pass gas. ?These symptoms may represent a serious problem that is an emergency. Do not wait to see if the symptoms will go away. Get medical help right away. Call your local emergency services (911 in the [place] Do not drive yourself to the hospital. ?Summary ?After the procedure, it is common to have pain in your abdomen and incision areas, tiredness, and constipation. ?Take over-the-counter and prescription medicines only as told by your health care provider. ?Return to your normal activities as told by your health care provider. Ask your health care provider what activities are safe for you. ?Follow instructions from your health care provider about how to take care of your incision areas. ?Contact a health care provider if you have pain that is not relieved with medicine or if you have any signs of infection. ?This information is not intended to replace advice given to you by your health care provider. Make sure you discuss any questions you have with your health care provider. ?Document Revised: 09/05/2018 Document  Reviewed: 09/05/2018 ?Elsevier Patient Education ? [place]. ? ?

## 2021-07-26 NOTE — Progress Notes (Signed)
[facility] ?POST-OP OFFICE VISIT ? ?07/26/2021 ? ?HPI: ?Connor Wilkinson is a 36 y.o. 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 ?He has done remarkably well ?Only complaint is that the superior portion of his mini-laparotomy incision has opened some after staples were removed following port placement, no drainage, no erythema ?No fever, chills, nausea, emesis ?Tolerating PO; bowel function normal ?Plan to see oncology on 04/19 for chemotherapy  ? ?Vital signs: ?BP 120/69   Pulse 83   Temp 98.5 ?F (36.9 ?C) (Oral)   Ht 6' (1.829 m)   Wt 167 lb 6.4 oz (75.9 kg)   SpO2 99%   BMI 22.70 kg/m?   ? ?Physical Exam: ?Constitutional: Well appearing male, NAD ?Abdomen: Soft, non-tender, non-distended, no rebound/guarding ?Chest: Port to the right upper chest; incision is CDI with dermabond ?Skin: There is a small, 1 cm opening, to the superior portion of his mini-laparotomy incision, this is only 5 mm deep, no erythema, no drainage. His remaining incisions are well healed.  ? ?Assessment/Plan: ?This is a 36 y.o. 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 ? - Pain control prn ? - Reviewed wound care; He will need to pack open portion of laparotomy wound daily until this closes; updated instructions. He states he is moving to a new group home, so he will have more help.  ? - Reviewed lifting restrictions; 6 weeks total ? - Okay to access port ? - I will see him again in 2-3 weeks to ensure he continues to heal well  ? ?-- ?Edison Simon, PA-C ?[facility] ?07/26/2021, 3:08 PM ?M-F: 7am - 4pm ? ?

## 2021-07-28 ENCOUNTER — Inpatient Hospital Stay: Payer: Medicaid Other

## 2021-07-28 ENCOUNTER — Telehealth: Payer: Self-pay | Admitting: *Deleted

## 2021-07-28 NOTE — Telephone Encounter (Signed)
Dr. Janese Banks got the message that pt would like [place] to look into sperm banking before starting chemo and wants [place] to check with [facility]. Per Dr. Janese Banks says risk of infertility with folfox is low overall. we will see if  he can see fertility experts at [facility] asap and if sperm banking will be affordable for him.I called [facility] at [phone]- got a message and I left message about how long it would take to see pt, and cost of sperm banking. Later in the day I called back and tried to get to specialist that can give info about. Then it went to a voicemail and just said that the cost would be based on insurance coverage and they are taking pts' but do not have appts at this time. I still left my telephone number to call me back. Also the voicemail said that we can fax info about pt [facility] to review fax # [phone]. ?I also have to get in touch with social worker [name] that is his guardian and get approval. She was on vacation starting 4/6  at 12 noon.tand was suppose to be gone 1 week. I will call her tom. Since I am not at work today. ?

## 2021-07-29 ENCOUNTER — Telehealth: Payer: Self-pay | Admitting: *Deleted

## 2021-07-29 NOTE — Telephone Encounter (Addendum)
Martinique from [facility] sperm banking called today. She states that they do get pt's in quickly if they have cancer dx and he has colon cancer. They also give a better price to help them also. ?She says the upfront charge for collection  !41.00. ?Then they use reprotech in [place] for the storage and it would be 400.00 dollars each year. ?I also told Martinique that pt has a guardian of [place] and the social worker name is [name] and I will need to get approval from her to do the sperm banking and she is on vacation and I believe she will come back next week so I will attempt to call her on Monday 4/117 Martinique said to call back with approval from guardian and they will take it from there. Telephone number is  [phone]. ?

## 2021-08-03 ENCOUNTER — Inpatient Hospital Stay: Payer: Medicaid Other

## 2021-08-03 ENCOUNTER — Telehealth: Payer: Self-pay | Admitting: *Deleted

## 2021-08-03 ENCOUNTER — Encounter: Payer: Self-pay | Admitting: Oncology

## 2021-08-03 ENCOUNTER — Inpatient Hospital Stay (HOSPITAL_BASED_OUTPATIENT_CLINIC_OR_DEPARTMENT_OTHER): Payer: Medicaid Other | Admitting: Oncology

## 2021-08-03 VITALS — BP 107/65 | HR 89 | Temp 98.3°F | Resp 16 | Ht 72.0 in | Wt 164.1 lb

## 2021-08-03 DIAGNOSIS — Z7189 Other specified counseling: Secondary | ICD-10-CM

## 2021-08-03 DIAGNOSIS — C189 Malignant neoplasm of colon, unspecified: Secondary | ICD-10-CM

## 2021-08-03 DIAGNOSIS — Z5111 Encounter for antineoplastic chemotherapy: Secondary | ICD-10-CM | POA: Diagnosis not present

## 2021-08-03 DIAGNOSIS — D509 Iron deficiency anemia, unspecified: Secondary | ICD-10-CM | POA: Diagnosis not present

## 2021-08-03 LAB — COMPREHENSIVE METABOLIC PANEL
ALT: 34 U/L (ref 0–44)
AST: 23 U/L (ref 15–41)
Albumin: 4.2 g/dL (ref 3.5–5.0)
Alkaline Phosphatase: 57 U/L (ref 38–126)
Anion gap: 6 (ref 5–15)
BUN: 14 mg/dL (ref 6–20)
CO2: 26 mmol/L (ref 22–32)
Calcium: 9.3 mg/dL (ref 8.9–10.3)
Chloride: 105 mmol/L (ref 98–111)
Creatinine, Ser: 0.98 mg/dL (ref 0.61–1.24)
GFR, Estimated: >60 mL/min (ref >60)
Glucose, Bld: 89 mg/dL (ref 70–99)
Potassium: 3.9 mmol/L (ref 3.5–5.1)
Sodium: 137 mmol/L (ref 135–145)
Total Bilirubin: 0.7 mg/dL (ref 0.3–1.2)
Total Protein: 7 g/dL (ref 6.5–8.1)

## 2021-08-03 LAB — CBC WITH DIFFERENTIAL/PLATELET
Abs Immature Granulocytes: 0.02 10*3/uL (ref 0.00–0.07)
Basophils Absolute: 0 10*3/uL (ref 0.0–0.1)
Basophils Relative: 1 %
Eosinophils Absolute: 0.3 10*3/uL (ref 0.0–0.5)
Eosinophils Relative: 5 %
HCT: 36.9 % — ABNORMAL LOW (ref 39.0–52.0)
Hemoglobin: 10.8 g/dL — ABNORMAL LOW (ref 13.0–17.0)
Immature Granulocytes: 0 %
Lymphocytes Relative: 22 %
Lymphs Abs: 1.4 10*3/uL (ref 0.7–4.0)
MCH: 24 pg — ABNORMAL LOW (ref 26.0–34.0)
MCHC: 29.3 g/dL — ABNORMAL LOW (ref 30.0–36.0)
MCV: 82 fL (ref 80.0–100.0)
Monocytes Absolute: 0.6 10*3/uL (ref 0.1–1.0)
Monocytes Relative: 9 %
Neutro Abs: 4 10*3/uL (ref 1.7–7.7)
Neutrophils Relative %: 63 %
Platelets: 329 10*3/uL (ref 150–400)
RBC: 4.5 MIL/uL (ref 4.22–5.81)
RDW: 25 % — ABNORMAL HIGH (ref 11.5–15.5)
WBC: 6.3 10*3/uL (ref 4.0–10.5)
nRBC: 0 % (ref 0.0–0.2)

## 2021-08-03 LAB — SURGICAL PATHOLOGY

## 2021-08-03 LAB — IRON AND TIBC
Iron: 29 ug/dL — ABNORMAL LOW (ref 45–182)
Saturation Ratios: 6 % — ABNORMAL LOW (ref 17.9–39.5)
TIBC: 454 ug/dL — ABNORMAL HIGH (ref 250–450)
UIBC: 425 ug/dL

## 2021-08-03 LAB — FERRITIN: Ferritin: 31 ng/mL (ref 24–336)

## 2021-08-03 MED ORDER — LIDOCAINE-PRILOCAINE 2.5-2.5 % EX CREA: TOPICAL_CREAM | CUTANEOUS | 3 refills | Status: DC

## 2021-08-03 MED ORDER — DEXTROSE 5 % IV SOLN
Freq: Once | INTRAVENOUS | Status: AC
  Filled 2021-08-03: qty 250

## 2021-08-03 MED ORDER — HEPARIN SOD (PORK) LOCK FLUSH 100 UNIT/ML IV SOLN
500.0000 [IU] | Freq: Once | INTRAVENOUS | Status: DC | PRN
  Filled 2021-08-03: qty 5

## 2021-08-03 MED ORDER — PALONOSETRON HCL INJECTION 0.25 MG/5ML
0.2500 mg | Freq: Once | INTRAVENOUS | Status: AC
  Administered 2021-08-03: 0.25 mg via INTRAVENOUS
  Filled 2021-08-03: qty 5

## 2021-08-03 MED ORDER — LEUCOVORIN CALCIUM INJECTION 350 MG
800.0000 mg | Freq: Once | INTRAVENOUS | Status: AC
  Administered 2021-08-03: 800 mg via INTRAVENOUS
  Filled 2021-08-03: qty 40

## 2021-08-03 MED ORDER — FLUOROURACIL CHEMO INJECTION 2.5 GM/50ML
400.0000 mg/m2 | Freq: Once | INTRAVENOUS | Status: AC
  Administered 2021-08-03: 800 mg via INTRAVENOUS
  Filled 2021-08-03: qty 16

## 2021-08-03 MED ORDER — PROCHLORPERAZINE MALEATE 10 MG PO TABS: 10.0000 mg | ORAL_TABLET | Freq: Four times a day (QID) | ORAL | 1 refills | Status: DC | PRN

## 2021-08-03 MED ORDER — SODIUM CHLORIDE 0.9 % IV SOLN
10.0000 mg | Freq: Once | INTRAVENOUS | Status: AC
  Administered 2021-08-03: 10 mg via INTRAVENOUS
  Filled 2021-08-03: qty 1

## 2021-08-03 MED ORDER — SODIUM CHLORIDE 0.9 % IV SOLN
5000.0000 mg | INTRAVENOUS | Status: DC
  Administered 2021-08-03: 5000 mg via INTRAVENOUS
  Filled 2021-08-03: qty 100

## 2021-08-03 MED ORDER — DEXAMETHASONE 4 MG PO TABS: 8.0000 mg | ORAL_TABLET | Freq: Every day | ORAL | 1 refills | Status: DC

## 2021-08-03 MED ORDER — OXALIPLATIN CHEMO INJECTION 100 MG/20ML
85.0000 mg/m2 | Freq: Once | INTRAVENOUS | Status: AC
  Administered 2021-08-03: 165 mg via INTRAVENOUS
  Filled 2021-08-03: qty 33

## 2021-08-03 MED ORDER — ONDANSETRON HCL 8 MG PO TABS: 8.0000 mg | ORAL_TABLET | Freq: Two times a day (BID) | ORAL | 1 refills | Status: DC | PRN

## 2021-08-03 NOTE — Telephone Encounter (Signed)
I called Connor Wilkinson to speak about the pt wanted to do sperm banking. I told her that there was a cost for testing his sperm first about 141 dollars and then he could save the sperm and it would cost him 400 dollars  yearly as long as he wants to keep it. The storage is in [place]. Connor Wilkinson said she will need to speak to patient and his finances. Told me to call her late in day tom. And she will let me know. If pt goes ahead and want sperm banking then we will need to cancel chemo for 4/19. ?

## 2021-08-03 NOTE — Telephone Encounter (Signed)
Medications on 4 /6 were sent to wrong pharmacy, they need to go to [facility]. ?I have resent to correct pharmacy ?

## 2021-08-03 NOTE — Progress Notes (Signed)
? ? ? ?Hematology/Oncology Consult note ?[place]  ?[phone] [phone] ? ?Patient Care Team: ?[name], MD as PCP - General (Internal Medicine) ?Sindy Guadeloupe, MD as Consulting Physician (Hematology and Oncology) ?Jules Husbands, MD as Consulting Physician (General Surgery) ?Lin Landsman, MD as Consulting Physician (Gastroenterology) ?Clent Jacks, RN as Oncology Nurse Navigator  ? ?Name of the patient: Connor Wilkinson  ?[id number]  ?1985/04/19  ? ?Date of visit: 08/03/21 ? 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 ?Chief complaint/ Reason for visit-on treatment assessment prior to cycle 1 of adjuvant FOLFOX chemotherapy ? ?Heme/Onc history: patient is a 36 year old male with a past medical history significant for mild bipolar disorder who is a ward of the stateAnd was admitted to the hospital for severe anemia.  He was found to have a hemoglobin of 4.5/18.7 on admission.  He received blood transfusion as well as IV iron and his hemoglobin is presently up to an 8.  He underwent colonoscopy which showed malignant partially obstructing tumor that was circumferential 80 cm proximal to the anus.  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  CT chest abdomen and pelvis with contrast did not show any evidence of distant metastatic disease.  2 small sclerotic densities in the acetabulum and proximal right femur likely benign process.  Pathologically enlarged lymph nodes superior to circumferential colonic lesion suggesting metastatic adenopathy. ?  ?Patient underwent right hemicolectomy with Dr. Dahlia Byes on 07/12/2021.  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  Vermiform appendix negative for tumor.  Radial margin positive for tumor (2 lymph nodes at  margin with adenocarcinoma).  Proximal and distal margins negative.  7 out of 56 lymph nodes positive for metastatic adenocarcinoma.  Tumor deposits not applicable.  No lymphovascular or perineural invasion seen.  [id number] ? ?Fertility conservation was also discussed an option of sperm banking at [facility] was discussed with the patient and his healthcare power of attorney [name].  There would be a one-time cost involved for sperm banking and a yearly cost for preserving the specimen.  Overall chances of infertility with FOLFOX is low but possible.  Patient and his healthcare power of attorney have discussed their options and have decided not to proceed with fertility conservation at this time ?  ? ?Interval history-patient has a port in place and is otherwise doing well. ? ?ECOG PS- 0 ?Pain scale- 0 ? ?Review of systems- Review of Systems  ?Constitutional:  Positive for malaise/fatigue. Negative for chills, fever and weight loss.  ?HENT:  Negative for congestion, ear discharge and nosebleeds.   ?Eyes:  Negative for blurred vision.  ?Respiratory:  Negative for cough, hemoptysis, sputum production, shortness of breath and wheezing.   ?Cardiovascular:  Negative for chest pain, palpitations, orthopnea and claudication.  ?Gastrointestinal:  Negative for abdominal pain, blood in stool, constipation, diarrhea, heartburn, melena, nausea and vomiting.  ?Genitourinary:  Negative for dysuria, flank pain, frequency, hematuria and urgency.  ?Musculoskeletal:  Negative for back pain, joint pain and myalgias.  ?Skin:  Negative for rash.  ?Neurological:  Negative for dizziness, tingling, focal weakness, seizures, weakness and headaches.  ?Endo/Heme/Allergies:  Does not bruise/bleed easily.  ?Psychiatric/Behavioral:  Negative for depression and suicidal ideas. The patient does not have insomnia.    ? ? ? ?Allergies  ?Allergen Reactions  ? Geodon [Ziprasidone Hcl] Anaphylaxis  ? ? ? ?  Past Medical History:  ?Diagnosis Date  ? Bipolar 1  disorder ([place])   ? Colon cancer ([place])   ? PTSD (post-traumatic stress disorder)   ? ? ? ?Past Surgical History:  ?Procedure Laterality Date  ? COLON RESECTION N/A 07/12/2021  ? Procedure: HAND ASSISTED LAPAROSCOPIC COLON RESECTION;  Surgeon: Jules Husbands, MD;  Location: [facility] ORS;  Service: General;  Laterality: N/A;  ? COLONOSCOPY WITH PROPOFOL N/A 07/06/2021  ? Procedure: COLONOSCOPY WITH PROPOFOL;  Surgeon: Lin Landsman, MD;  Location: [facility] ENDOSCOPY;  Service: Gastroenterology;  Laterality: N/A;  ? COLONOSCOPY WITH PROPOFOL N/A 07/07/2021  ? Procedure: COLONOSCOPY WITH PROPOFOL;  Surgeon: Lin Landsman, MD;  Location: [facility] ENDOSCOPY;  Service: Gastroenterology;  Laterality: N/A;  ? ESOPHAGOGASTRODUODENOSCOPY (EGD) WITH PROPOFOL N/A 07/06/2021  ? Procedure: ESOPHAGOGASTRODUODENOSCOPY (EGD) WITH PROPOFOL;  Surgeon: Lin Landsman, MD;  Location: [facility] ENDOSCOPY;  Service: Gastroenterology;  Laterality: N/A;  ? GIVENS CAPSULE STUDY N/A 07/06/2021  ? Procedure: GIVENS CAPSULE STUDY;  Surgeon: Lin Landsman, MD;  Location: [facility] ENDOSCOPY;  Service: Gastroenterology;  Laterality: N/A;  ? PORTACATH PLACEMENT Right 07/22/2021  ? Procedure: INSERTION PORT-A-CATH;  Surgeon: Jules Husbands, MD;  Location: [facility] ORS;  Service: General;  Laterality: Right;  ? ? ?Social History  ? ?Socioeconomic History  ? Marital status: Single  ?  Spouse name: Not on file  ? Number of children: Not on file  ? Years of education: Not on file  ? Highest education level: Not on file  ?Occupational History  ? Not on file  ?Tobacco Use  ? Smoking status: Every Day  ?  Packs/day: 0.25  ?  Types: Cigarettes  ? Smokeless tobacco: Never  ? Tobacco comments:  ?  5 cig a day  ?Vaping Use  ? Vaping Use: Every day  ? Devices: elfbar  ?Substance and Sexual Activity  ? Alcohol use: Not Currently  ? Drug use: Not Currently  ? Sexual activity: Yes  ?Other Topics Concern  ? Not on file  ?Social History Narrative  ? Not on file  ? ?Social  Determinants of Health  ? ?Financial Resource Strain: Not on file  ?Food Insecurity: Not on file  ?Transportation Needs: Not on file  ?Physical Activity: Not on file  ?Stress: Not on file  ?Social Connections: Not on file  ?Intimate Partner Violence: Not on file  ? ? ?Family History  ?Problem Relation Age of Onset  ? Kidney cancer Father   ? Breast cancer Maternal Aunt   ? ? ? ?Current Outpatient Medications:  ?  ARIPiprazole ER (ABILIFY MAINTENA) 400 MG PRSY prefilled syringe, Inject 400 mg into the muscle every 21 ( twenty-one) days., Disp: , Rfl:  ?  benztropine (COGENTIN) 2 MG tablet, Take 2 mg by mouth at bedtime., Disp: , Rfl:  ?  busPIRone (BUSPAR) 10 MG tablet, Take 10 mg by mouth 2 (two) times daily., Disp: , Rfl:  ?  dexamethasone (DECADRON) 4 MG tablet, Take 2 tablets (8 mg total) by mouth daily. Start the day after chemotherapy for 2 days. Take with food., Disp: 30 tablet, Rfl: 1 ?  FLUoxetine (PROZAC) 20 MG capsule, Take 20 mg by mouth daily., Disp: , Rfl:  ?  ibuprofen (ADVIL) 600 MG tablet, Take 1 tablet (600 mg total) by mouth every 6 (six) hours as needed., Disp: 30 tablet, Rfl: 0 ?  lidocaine-prilocaine (EMLA) cream, Apply to affected area once, Disp: 30 g, Rfl: 3 ?  ondansetron (ZOFRAN) 8 MG tablet, Take  1 tablet (8 mg total) by mouth 2 (two) times daily as needed for refractory nausea / vomiting. Start on day 3 after chemotherapy., Disp: 30 tablet, Rfl: 1 ?  oxyCODONE (OXY IR/ROXICODONE) 5 MG immediate release tablet, Take 1 tablet (5 mg total) by mouth every 6 (six) hours as needed for severe pain or breakthrough pain., Disp: 25 tablet, Rfl: 0 ?  polyethylene glycol (MIRALAX / GLYCOLAX) 17 g packet, Take 17 g by mouth daily as needed (constipation)., Disp: 14 each, Rfl: 0 ?  prazosin (MINIPRESS) 2 MG capsule, Take 2 mg by mouth 2 (two) times daily., Disp: , Rfl:  ?  prochlorperazine (COMPAZINE) 10 MG tablet, Take 1 tablet (10 mg total) by mouth every 6 (six) hours as needed (Nausea or  vomiting)., Disp: 30 tablet, Rfl: 1 ? ?Physical exam:  ?Vitals:  ? 08/03/21 0842  ?BP: 107/65  ?Pulse: 89  ?Resp: 16  ?Temp: 98.3 ?F (36.8 ?C)  ?TempSrc: Oral  ?Weight: 164 lb 1.6 oz (74.4 kg)  ?Height: 6' (1.829 m

## 2021-08-03 NOTE — Patient Instructions (Signed)
[facility] AT [place]  Discharge Instructions: ?Thank you for choosing [facility] to provide your oncology and hematology care.  ?If you have a lab appointment with the [facility], please go directly to the [facility] and check in at the registration area. ? ?Wear comfortable clothing and clothing appropriate for easy access to any Portacath or PICC line.  ? ?We strive to give you quality time with your provider. You may need to reschedule your appointment if you arrive late (15 or more minutes).  Arriving late affects you and other patients whose appointments are after yours.  Also, if you miss three or more appointments without notifying the office, you may be dismissed from the clinic at the provider?s discretion.    ?  ?For prescription refill requests, have your pharmacy contact our office and allow 72 hours for refills to be completed.   ? ?Today you received the following chemotherapy and/or immunotherapy agents OXALIPLATIN, LEUCOVORIN, 5FU    ?  ?To help prevent nausea and vomiting after your treatment, we encourage you to take your nausea medication as directed. ? ?BELOW ARE SYMPTOMS THAT SHOULD BE REPORTED IMMEDIATELY: ?*FEVER GREATER THAN 100.4 F (38 ?C) OR HIGHER ?*CHILLS OR SWEATING ?*NAUSEA AND VOMITING THAT IS NOT CONTROLLED WITH YOUR NAUSEA MEDICATION ?*UNUSUAL SHORTNESS OF BREATH ?*UNUSUAL BRUISING OR BLEEDING ?*URINARY PROBLEMS (pain or burning when urinating, or frequent urination) ?*BOWEL PROBLEMS (unusual diarrhea, constipation, pain near the anus) ?TENDERNESS IN MOUTH AND THROAT WITH OR WITHOUT PRESENCE OF ULCERS (sore throat, sores in mouth, or a toothache) ?UNUSUAL RASH, SWELLING OR PAIN  ?UNUSUAL VAGINAL DISCHARGE OR ITCHING  ? ?Items with * indicate a potential emergency and should be followed up as soon as possible or go to the Emergency Department if any problems should occur. ? ?Please show the CHEMOTHERAPY ALERT CARD or IMMUNOTHERAPY ALERT  CARD at check-in to the Emergency Department and triage nurse. ? ?Should you have questions after your visit or need to cancel or reschedule your appointment, please contact [facility] [place] AT [place]  [phone] and follow the prompts.  Office hours are 8:00 a.m. to 4:30 p.m. Monday - Friday. Please note that voicemails left after 4:00 p.m. may not be returned until the following business day.  We are closed weekends and major holidays. You have access to a nurse at all times for urgent questions. Please call the main number to the clinic [phone] and follow the prompts. ? ?For any non-urgent questions, you may also contact your provider using MyChart. We now offer e-Visits for anyone 39 and older to request care online for non-urgent symptoms. For details visit [email or web address]. ?  ?Also download the MyChart app! Go to the app store, search "MyChart", open the app, select [facility], and log in with your MyChart username and password. ? ?Due to Covid, a mask is required upon entering the hospital/clinic. If you do not have a mask, one will be given to you upon arrival. For doctor visits, patients may have 1 support person aged 75 or older with them. For treatment visits, patients cannot have anyone with them due to current Covid guidelines and our immunocompromised population.  ? ?Oxaliplatin Injection ?What is this medication? ?OXALIPLATIN (ox AL i PLA tin) is a chemotherapy drug. It targets fast dividing cells, like cancer cells, and causes these cells to die. This medicine is used to treat cancers of the colon and rectum, and many other cancers. ?This medicine may be used for other  purposes; ask your health care provider or pharmacist if you have questions. ?COMMON BRAND NAME(S): Eloxatin ?What should I tell my care team before I take this medication? ?They need to know if you have any of these conditions: ?heart disease ?history of irregular heartbeat ?liver disease ?low blood  counts, like white cells, platelets, or red blood cells ?lung or breathing disease, like asthma ?take medicines that treat or prevent blood clots ?tingling of the fingers or toes, or other nerve disorder ?an unusual or allergic reaction to oxaliplatin, other chemotherapy, other medicines, foods, dyes, or preservatives ?pregnant or trying to get pregnant ?breast-feeding ?How should I use this medication? ?This drug is given as an infusion into a vein. It is administered in a hospital or clinic by a specially trained health care professional. ?Talk to your pediatrician regarding the use of this medicine in children. Special care may be needed. ?Overdosage: If you think you have taken too much of this medicine contact a poison control center or emergency room at once. ?NOTE: This medicine is only for you. Do not share this medicine with others. ?What if I miss a dose? ?It is important not to miss a dose. Call your doctor or health care professional if you are unable to keep an appointment. ?What may interact with this medication? ?Do not take this medicine with any of the following medications: ?cisapride ?dronedarone ?pimozide ?thioridazine ?This medicine may also interact with the following medications: ?aspirin and aspirin-like medicines ?certain medicines that treat or prevent blood clots like warfarin, apixaban, dabigatran, and rivaroxaban ?cisplatin ?cyclosporine ?diuretics ?medicines for infection like acyclovir, adefovir, amphotericin B, bacitracin, cidofovir, foscarnet, ganciclovir, gentamicin, pentamidine, vancomycin ?NSAIDs, medicines for pain and inflammation, like ibuprofen or naproxen ?other medicines that prolong the QT interval (an abnormal heart rhythm) ?pamidronate ?zoledronic acid ?This list may not describe all possible interactions. Give your health care provider a list of all the medicines, herbs, non-prescription drugs, or dietary supplements you use. Also tell them if you smoke, drink alcohol,  or use illegal drugs. Some items may interact with your medicine. ?What should I watch for while using this medication? ?Your condition will be monitored carefully while you are receiving this medicine. ?You may need blood work done while you are taking this medicine. ?This medicine may make you feel generally unwell. This is not uncommon as chemotherapy can affect healthy cells as well as cancer cells. Report any side effects. Continue your course of treatment even though you feel ill unless your healthcare professional tells you to stop. ?This medicine can make you more sensitive to cold. Do not drink cold drinks or use ice. Cover exposed skin before coming in contact with cold temperatures or cold objects. When out in cold weather wear warm clothing and cover your mouth and nose to warm the air that goes into your lungs. Tell your doctor if you get sensitive to the cold. ?Do not become pregnant while taking this medicine or for 9 months after stopping it. Women should inform their health care professional if they wish to become pregnant or think they might be pregnant. Men should not father a child while taking this medicine and for 6 months after stopping it. There is potential for serious side effects to an unborn child. Talk to your health care professional for more information. ?Do not breast-feed a child while taking this medicine or for 3 months after stopping it. ?This medicine has caused ovarian failure in some women. This medicine may make it more difficult to  get pregnant. Talk to your health care professional if you are concerned about your fertility. ?This medicine has caused decreased sperm counts in some men. This may make it more difficult to father a child. Talk to your health care professional if you are concerned about your fertility. ?This medicine may increase your risk of getting an infection. Call your health care professional for advice if you get a fever, chills, or sore throat, or other  symptoms of a cold or flu. Do not treat yourself. Try to avoid being around people who are sick. ?Avoid taking medicines that contain aspirin, acetaminophen, ibuprofen, naproxen, or ketoprofen unless instr

## 2021-08-03 NOTE — Telephone Encounter (Signed)
Called [place] [facility] because the pt lives in [facility] and [facility] is also in touch with [name] as well as the pt. I wanted to know if pt was going to do sperm banking or not. He states that pt . Changed his mind. Also called [name] after this and she said he decided not to do sperma banking. The pt. Also told this when I checked him in for the treatment. Also when I spoke to [place] I told her that pt. Has to sign consent for billing for the pump that he has to wear for 2 days with each treatment. Wanted to make sure she is ok with this. She asked if medicaid covers this and I told her we do not ave any issues with cost and payment ; the company that owns the pumps it is their requirement to sign or he can get a pump and his chemo regimen requires it. She is agreeable and pt will sign it ?

## 2021-08-03 NOTE — Progress Notes (Signed)
Pt here for new treatment folfox, no concerns ?

## 2021-08-03 NOTE — Telephone Encounter (Signed)
Called [name] and got her voicemail and I needed to know if pt wants to do sperm banking. Asked her to call back ?

## 2021-08-04 ENCOUNTER — Telehealth: Payer: Self-pay

## 2021-08-04 LAB — CEA: CEA: 2.6 ng/mL (ref 0.0–4.7)

## 2021-08-04 NOTE — Telephone Encounter (Signed)
Telephone call to patient for follow up after receiving first infusion and Connor Wilkinson, patients caregiver answered.  Connor Wilkinson states infusion went great.  States eating good and drinking plenty of fluids.   Denies any nausea or vomiting.  Encouraged caregiver to call for any concerns or questions.  ?

## 2021-08-05 ENCOUNTER — Inpatient Hospital Stay: Payer: Medicaid Other

## 2021-08-05 VITALS — BP 108/70 | HR 70 | Temp 97.2°F | Resp 18

## 2021-08-05 DIAGNOSIS — Z5111 Encounter for antineoplastic chemotherapy: Secondary | ICD-10-CM | POA: Diagnosis not present

## 2021-08-05 DIAGNOSIS — D508 Other iron deficiency anemias: Secondary | ICD-10-CM

## 2021-08-05 MED ORDER — SODIUM CHLORIDE 0.9 % IV SOLN
510.0000 mg | INTRAVENOUS | Status: DC
  Administered 2021-08-05: 510 mg via INTRAVENOUS
  Filled 2021-08-05: qty 17

## 2021-08-05 MED ORDER — HEPARIN SOD (PORK) LOCK FLUSH 100 UNIT/ML IV SOLN
INTRAVENOUS | Status: AC
  Filled 2021-08-05: qty 5

## 2021-08-05 MED ORDER — SODIUM CHLORIDE 0.9 % IV SOLN
Freq: Once | INTRAVENOUS | Status: AC
  Filled 2021-08-05: qty 250

## 2021-08-05 NOTE — Patient Instructions (Signed)

## 2021-08-11 ENCOUNTER — Encounter: Payer: Medicaid Other | Admitting: Physician Assistant

## 2021-08-17 ENCOUNTER — Encounter: Payer: Self-pay | Admitting: Oncology

## 2021-08-17 ENCOUNTER — Inpatient Hospital Stay: Payer: Medicaid Other

## 2021-08-17 ENCOUNTER — Inpatient Hospital Stay (HOSPITAL_BASED_OUTPATIENT_CLINIC_OR_DEPARTMENT_OTHER): Payer: Medicaid Other | Admitting: Oncology

## 2021-08-17 ENCOUNTER — Inpatient Hospital Stay: Payer: Medicaid Other | Attending: Oncology

## 2021-08-17 VITALS — BP 112/67 | HR 80 | Temp 98.3°F | Resp 16 | Ht 72.0 in | Wt 167.5 lb

## 2021-08-17 DIAGNOSIS — D509 Iron deficiency anemia, unspecified: Secondary | ICD-10-CM | POA: Diagnosis not present

## 2021-08-17 DIAGNOSIS — C189 Malignant neoplasm of colon, unspecified: Secondary | ICD-10-CM | POA: Insufficient documentation

## 2021-08-17 DIAGNOSIS — Z5111 Encounter for antineoplastic chemotherapy: Secondary | ICD-10-CM | POA: Insufficient documentation

## 2021-08-17 LAB — CBC WITH DIFFERENTIAL/PLATELET
Abs Immature Granulocytes: 0.01 10*3/uL (ref 0.00–0.07)
Basophils Absolute: 0 10*3/uL (ref 0.0–0.1)
Basophils Relative: 0 %
Eosinophils Absolute: 0.1 10*3/uL (ref 0.0–0.5)
Eosinophils Relative: 2 %
HCT: 36.5 % — ABNORMAL LOW (ref 39.0–52.0)
Hemoglobin: 11.1 g/dL — ABNORMAL LOW (ref 13.0–17.0)
Immature Granulocytes: 0 %
Lymphocytes Relative: 19 %
Lymphs Abs: 1 10*3/uL (ref 0.7–4.0)
MCH: 26 pg (ref 26.0–34.0)
MCHC: 30.4 g/dL (ref 30.0–36.0)
MCV: 85.5 fL (ref 80.0–100.0)
Monocytes Absolute: 0.4 10*3/uL (ref 0.1–1.0)
Monocytes Relative: 7 %
Neutro Abs: 3.9 10*3/uL (ref 1.7–7.7)
Neutrophils Relative %: 72 %
Platelets: 205 10*3/uL (ref 150–400)
RBC: 4.27 MIL/uL (ref 4.22–5.81)
RDW: 23.5 % — ABNORMAL HIGH (ref 11.5–15.5)
WBC: 5.5 10*3/uL (ref 4.0–10.5)
nRBC: 0 % (ref 0.0–0.2)

## 2021-08-17 LAB — COMPREHENSIVE METABOLIC PANEL
ALT: 17 U/L (ref 0–44)
AST: 23 U/L (ref 15–41)
Albumin: 4 g/dL (ref 3.5–5.0)
Alkaline Phosphatase: 66 U/L (ref 38–126)
Anion gap: 6 (ref 5–15)
BUN: 11 mg/dL (ref 6–20)
CO2: 25 mmol/L (ref 22–32)
Calcium: 8.8 mg/dL — ABNORMAL LOW (ref 8.9–10.3)
Chloride: 106 mmol/L (ref 98–111)
Creatinine, Ser: 0.88 mg/dL (ref 0.61–1.24)
GFR, Estimated: >60 mL/min (ref >60)
Glucose, Bld: 93 mg/dL (ref 70–99)
Potassium: 3.9 mmol/L (ref 3.5–5.1)
Sodium: 137 mmol/L (ref 135–145)
Total Bilirubin: 0.5 mg/dL (ref 0.3–1.2)
Total Protein: 6.6 g/dL (ref 6.5–8.1)

## 2021-08-17 MED ORDER — PALONOSETRON HCL INJECTION 0.25 MG/5ML
0.2500 mg | Freq: Once | INTRAVENOUS | Status: AC
  Administered 2021-08-17: 0.25 mg via INTRAVENOUS
  Filled 2021-08-17: qty 5

## 2021-08-17 MED ORDER — SODIUM CHLORIDE 0.9 % IV SOLN
2564.0000 mg/m2 | INTRAVENOUS | Status: DC
  Administered 2021-08-17: 5000 mg via INTRAVENOUS
  Filled 2021-08-17: qty 100

## 2021-08-17 MED ORDER — OXALIPLATIN CHEMO INJECTION 100 MG/20ML
85.0000 mg/m2 | Freq: Once | INTRAVENOUS | Status: AC
  Administered 2021-08-17: 165 mg via INTRAVENOUS
  Filled 2021-08-17: qty 33

## 2021-08-17 MED ORDER — DEXTROSE 5 % IV SOLN
Freq: Once | INTRAVENOUS | Status: AC
  Filled 2021-08-17: qty 250

## 2021-08-17 MED ORDER — FLUOROURACIL CHEMO INJECTION 2.5 GM/50ML
400.0000 mg/m2 | Freq: Once | INTRAVENOUS | Status: AC
  Administered 2021-08-17: 800 mg via INTRAVENOUS
  Filled 2021-08-17: qty 16

## 2021-08-17 MED ORDER — SODIUM CHLORIDE 0.9 % IV SOLN
INTRAVENOUS | Status: DC | PRN
  Filled 2021-08-17: qty 250

## 2021-08-17 MED ORDER — SODIUM CHLORIDE 0.9 % IV SOLN
10.0000 mg | Freq: Once | INTRAVENOUS | Status: AC
  Administered 2021-08-17: 10 mg via INTRAVENOUS
  Filled 2021-08-17: qty 10

## 2021-08-17 MED ORDER — LEUCOVORIN CALCIUM INJECTION 350 MG
800.0000 mg | Freq: Once | INTRAVENOUS | Status: AC
  Administered 2021-08-17: 800 mg via INTRAVENOUS
  Filled 2021-08-17: qty 40

## 2021-08-17 NOTE — Progress Notes (Signed)
Pt states that he had pain in stomach and  port site- took all the pain meds. He is not having pain today . He did have some cold sensitive around his lip pain with his lip ring ?

## 2021-08-17 NOTE — Progress Notes (Signed)
? ? ? ?Hematology/Oncology Consult note ?[place]  ?[phone]) [phone] [phone] ? ?Patient Care Team: ?[name], MD as PCP - General (Internal Medicine) ?Sindy Guadeloupe, MD as Consulting Physician (Hematology and Oncology) ?Jules Husbands, MD as Consulting Physician (General Surgery) ?Lin Landsman, MD as Consulting Physician (Gastroenterology) ?Clent Jacks, RN as Oncology Nurse Navigator  ? ?Name of the patient: Connor Wilkinson  ?[id number]  ?07/02/1985  ? ?Date of visit: 08/17/21 ? 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 ?Chief complaint/ Reason for visit-on treatment assessment prior to cycle 2 of adjuvant FOLFOX chemotherapy ? ?Heme/Onc history: patient is a 36 year old male with a past medical history significant for mild bipolar disorder who is a ward of the stateAnd was admitted to the hospital for severe anemia.  He was found to have a hemoglobin of 4.5/18.7 on admission.  He received blood transfusion as well as IV iron and his hemoglobin is presently up to an 8.  He underwent colonoscopy which showed malignant partially obstructing tumor that was circumferential 80 cm proximal to the anus.  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  CT chest abdomen and pelvis with contrast did not show any evidence of distant metastatic disease.  2 small sclerotic densities in the acetabulum and proximal right femur likely benign process.  Pathologically enlarged lymph nodes superior to circumferential colonic lesion suggesting metastatic adenopathy. ?  ?Patient underwent right hemicolectomy with Dr. Dahlia Byes on 07/12/2021.  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  Vermiform appendix negative for tumor.  Radial margin positive for tumor (2 lymph nodes at  margin with adenocarcinoma).  Proximal and distal margins negative.  7 out of 56 lymph nodes positive for metastatic adenocarcinoma.  Tumor deposits not applicable.  No lymphovascular or perineural invasion seen.  PT4APN2B ?  ?Fertility conservation was also discussed an option of sperm banking at [facility] was discussed with the patient and his healthcare power of attorney [name].  There would be a one-time cost involved for sperm banking and a yearly cost for preserving the specimen.  Overall chances of infertility with FOLFOX is low but possible.  Patient and his healthcare power of attorney have discussed their options and have decided not to proceed with fertility conservation at this time ?  ?  ? ?Interval history-tolerating chemotherapy well so far.  Denies any specific complaints at this time.  Has occasional cold sensitivity but it has not persisted.  Bowel movements are regular ? ?ECOG PS- 0 ?Pain scale- 0 ? ? ?Review of systems- Review of Systems  ?Constitutional:  Negative for chills, fever, malaise/fatigue and weight loss.  ?HENT:  Negative for congestion, ear discharge and nosebleeds.   ?Eyes:  Negative for blurred vision.  ?Respiratory:  Negative for cough, hemoptysis, sputum production, shortness of breath and wheezing.   ?Cardiovascular:  Negative for chest pain, palpitations, orthopnea and claudication.  ?Gastrointestinal:  Negative for abdominal pain, blood in stool, constipation, diarrhea, heartburn, melena, nausea and vomiting.  ?Genitourinary:  Negative for dysuria, flank pain, frequency, hematuria and urgency.  ?Musculoskeletal:  Negative for back pain, joint pain and myalgias.  ?Skin:  Negative for rash.  ?Neurological:  Negative for dizziness, tingling, focal weakness, seizures, weakness and headaches.  ?Endo/Heme/Allergies:  Does not bruise/bleed easily.  ?Psychiatric/Behavioral:  Negative for depression and suicidal ideas. The patient does not have insomnia.    ? ? ? ?  Allergies  ?Allergen  Reactions  ? Geodon [Ziprasidone Hcl] Anaphylaxis  ? ? ? ?Past Medical History:  ?Diagnosis Date  ? Bipolar 1 disorder ([place])   ? Colon cancer ([place])   ? PTSD (post-traumatic stress disorder)   ? ? ? ?Past Surgical History:  ?Procedure Laterality Date  ? COLON RESECTION N/A 07/12/2021  ? Procedure: HAND ASSISTED LAPAROSCOPIC COLON RESECTION;  Surgeon: Jules Husbands, MD;  Location: [facility] ORS;  Service: General;  Laterality: N/A;  ? COLONOSCOPY WITH PROPOFOL N/A 07/06/2021  ? Procedure: COLONOSCOPY WITH PROPOFOL;  Surgeon: Lin Landsman, MD;  Location: [facility] ENDOSCOPY;  Service: Gastroenterology;  Laterality: N/A;  ? COLONOSCOPY WITH PROPOFOL N/A 07/07/2021  ? Procedure: COLONOSCOPY WITH PROPOFOL;  Surgeon: Lin Landsman, MD;  Location: [facility] ENDOSCOPY;  Service: Gastroenterology;  Laterality: N/A;  ? ESOPHAGOGASTRODUODENOSCOPY (EGD) WITH PROPOFOL N/A 07/06/2021  ? Procedure: ESOPHAGOGASTRODUODENOSCOPY (EGD) WITH PROPOFOL;  Surgeon: Lin Landsman, MD;  Location: [facility] ENDOSCOPY;  Service: Gastroenterology;  Laterality: N/A;  ? GIVENS CAPSULE STUDY N/A 07/06/2021  ? Procedure: GIVENS CAPSULE STUDY;  Surgeon: Lin Landsman, MD;  Location: [facility] ENDOSCOPY;  Service: Gastroenterology;  Laterality: N/A;  ? PORTACATH PLACEMENT Right 07/22/2021  ? Procedure: INSERTION PORT-A-CATH;  Surgeon: Jules Husbands, MD;  Location: [facility] ORS;  Service: General;  Laterality: Right;  ? ? ?Social History  ? ?Socioeconomic History  ? Marital status: Single  ?  Spouse name: Not on file  ? Number of children: Not on file  ? Years of education: Not on file  ? Highest education level: Not on file  ?Occupational History  ? Not on file  ?Tobacco Use  ? Smoking status: Every Day  ?  Packs/day: 0.25  ?  Types: Cigarettes  ? Smokeless tobacco: Never  ? Tobacco comments:  ?  5 cig a day  ?Vaping Use  ? Vaping Use: Every day  ? Devices: elfbar  ?Substance and Sexual Activity  ? Alcohol use: Not Currently  ? Drug use: Not Currently  ? Sexual  activity: Yes  ?Other Topics Concern  ? Not on file  ?Social History Narrative  ? Not on file  ? ?Social Determinants of Health  ? ?Financial Resource Strain: Not on file  ?Food Insecurity: Not on file  ?Transportation Needs: Not on file  ?Physical Activity: Not on file  ?Stress: Not on file  ?Social Connections: Not on file  ?Intimate Partner Violence: Not on file  ? ? ?Family History  ?Problem Relation Age of Onset  ? Kidney cancer Father   ? Breast cancer Maternal Aunt   ? ? ? ?Current Outpatient Medications:  ?  ARIPiprazole ER (ABILIFY MAINTENA) 400 MG PRSY prefilled syringe, Inject 400 mg into the muscle every 21 ( twenty-one) days., Disp: , Rfl:  ?  benztropine (COGENTIN) 2 MG tablet, Take 2 mg by mouth at bedtime., Disp: , Rfl:  ?  busPIRone (BUSPAR) 10 MG tablet, Take 10 mg by mouth 2 (two) times daily., Disp: , Rfl:  ?  dexamethasone (DECADRON) 4 MG tablet, Take 2 tablets (8 mg total) by mouth daily. Start the day after chemotherapy for 2 days. Take with food., Disp: 30 tablet, Rfl: 1 ?  FLUoxetine (PROZAC) 20 MG capsule, Take 20 mg by mouth daily., Disp: , Rfl:  ?  ibuprofen (ADVIL) 600 MG tablet, Take 1 tablet (600 mg total) by mouth every 6 (six) hours as needed., Disp: 30 tablet, Rfl: 0 ?  lidocaine-prilocaine (EMLA) cream, Apply to affected area  once, Disp: 30 g, Rfl: 3 ?  polyethylene glycol (MIRALAX / GLYCOLAX) 17 g packet, Take 17 g by mouth daily as needed (constipation)., Disp: 14 each, Rfl: 0 ?  prazosin (MINIPRESS) 2 MG capsule, Take 2 mg by mouth 2 (two) times daily., Disp: , Rfl:  ?  ondansetron (ZOFRAN) 8 MG tablet, Take 1 tablet (8 mg total) by mouth 2 (two) times daily as needed for refractory nausea / vomiting. Start on day 3 after chemotherapy. (Patient not taking: Reported on 08/17/2021), Disp: 30 tablet, Rfl: 1 ?  oxyCODONE (OXY IR/ROXICODONE) 5 MG immediate release tablet, Take 1 tablet (5 mg total) by mouth every 6 (six) hours as needed for severe pain or breakthrough pain. (Patient  not taking: Reported on 08/17/2021), Disp: 25 tablet, Rfl: 0 ?  prochlorperazine (COMPAZINE) 10 MG tablet, Take 1 tablet (10 mg total) by mouth every 6 (six) hours as needed (Nausea or vomiting). (Patient n

## 2021-08-17 NOTE — Patient Instructions (Signed)
[facility] [place]  Discharge Instructions: ?Thank you for choosing [facility] to provide your oncology and hematology care.  ?If you have a lab appointment with the Cancer Center, please go directly to the Cancer Center and check in at the registration area. ? ?Wear comfortable clothing and clothing appropriate for easy access to any Portacath or PICC line.  ? ?We strive to give you quality time with your provider. You may need to reschedule your appointment if you arrive late (15 or more minutes).  Arriving late affects you and other patients whose appointments are after yours.  Also, if you miss three or more appointments without notifying the office, you may be dismissed from the clinic at the provider?s discretion.    ?  ?For prescription refill requests, have your pharmacy contact our office and allow 72 hours for refills to be completed.   ? ?  ?To help prevent nausea and vomiting after your treatment, we encourage you to take your nausea medication as directed. ? ?BELOW ARE SYMPTOMS THAT SHOULD BE REPORTED IMMEDIATELY: ?*FEVER GREATER THAN 100.4 F (38 ?C) OR HIGHER ?*CHILLS OR SWEATING ?*NAUSEA AND VOMITING THAT IS NOT CONTROLLED WITH YOUR NAUSEA MEDICATION ?*UNUSUAL SHORTNESS OF BREATH ?*UNUSUAL BRUISING OR BLEEDING ?*URINARY PROBLEMS (pain or burning when urinating, or frequent urination) ?*BOWEL PROBLEMS (unusual diarrhea, constipation, pain near the anus) ?TENDERNESS IN MOUTH AND THROAT WITH OR WITHOUT PRESENCE OF ULCERS (sore throat, sores in mouth, or a toothache) ?UNUSUAL RASH, SWELLING OR PAIN  ?UNUSUAL VAGINAL DISCHARGE OR ITCHING  ? ?Items with * indicate a potential emergency and should be followed up as soon as possible or go to the Emergency Department if any problems should occur. ? ?Please show the CHEMOTHERAPY ALERT CARD or IMMUNOTHERAPY ALERT CARD at check-in to the Emergency Department and triage nurse. ? ?Should you have questions after your visit  or need to cancel or reschedule your appointment, please contact [facility] [place]  [phone] and follow the prompts.  Office hours are 8:00 a.m. to 4:30 p.m. Monday - Friday. Please note that voicemails left after 4:00 p.m. may not be returned until the following business day.  We are closed weekends and major holidays. You have access to a nurse at all times for urgent questions. Please call the main number to the clinic [phone] and follow the prompts. ? ?For any non-urgent questions, you may also contact your provider using MyChart. We now offer e-Visits for anyone 18 and older to request care online for non-urgent symptoms. For details visit [email or web address]. ?  ?Also download the MyChart app! Go to the app store, search "MyChart", open the app, select [place], and log in with your MyChart username and password. ? ?Due to Covid, a mask is required upon entering the hospital/clinic. If you do not have a mask, one will be given to you upon arrival. For doctor visits, patients may have 1 support person aged 18 or older with them. For treatment visits, patients cannot have anyone with them due to current Covid guidelines and our immunocompromised population.  ?

## 2021-08-19 ENCOUNTER — Inpatient Hospital Stay: Payer: Medicaid Other

## 2021-08-19 VITALS — BP 131/78 | HR 81 | Temp 97.4°F | Resp 17

## 2021-08-19 DIAGNOSIS — C189 Malignant neoplasm of colon, unspecified: Secondary | ICD-10-CM

## 2021-08-19 DIAGNOSIS — Z5111 Encounter for antineoplastic chemotherapy: Secondary | ICD-10-CM | POA: Diagnosis not present

## 2021-08-19 DIAGNOSIS — D508 Other iron deficiency anemias: Secondary | ICD-10-CM

## 2021-08-19 MED ORDER — HEPARIN SOD (PORK) LOCK FLUSH 100 UNIT/ML IV SOLN
INTRAVENOUS | Status: AC
  Filled 2021-08-19: qty 5

## 2021-08-19 MED ORDER — HEPARIN SOD (PORK) LOCK FLUSH 100 UNIT/ML IV SOLN
500.0000 [IU] | Freq: Once | INTRAVENOUS | Status: AC | PRN
  Administered 2021-08-19: 500 [IU]
  Filled 2021-08-19: qty 5

## 2021-08-19 MED ORDER — SODIUM CHLORIDE 0.9 % IV SOLN
Freq: Once | INTRAVENOUS | Status: AC
  Filled 2021-08-19: qty 250

## 2021-08-19 MED ORDER — SODIUM CHLORIDE 0.9% FLUSH
10.0000 mL | INTRAVENOUS | Status: DC | PRN
  Filled 2021-08-19: qty 10

## 2021-08-19 MED ORDER — SODIUM CHLORIDE 0.9 % IV SOLN
510.0000 mg | INTRAVENOUS | Status: DC
  Administered 2021-08-19: 510 mg via INTRAVENOUS
  Filled 2021-08-19: qty 510

## 2021-08-30 MED FILL — Dexamethasone Sodium Phosphate Inj 100 MG/10ML: INTRAMUSCULAR | Qty: 1 | Status: AC

## 2021-08-31 ENCOUNTER — Inpatient Hospital Stay (HOSPITAL_BASED_OUTPATIENT_CLINIC_OR_DEPARTMENT_OTHER): Payer: Medicaid Other | Admitting: Oncology

## 2021-08-31 ENCOUNTER — Telehealth: Payer: Self-pay | Admitting: Oncology

## 2021-08-31 ENCOUNTER — Inpatient Hospital Stay: Payer: Medicaid Other

## 2021-08-31 ENCOUNTER — Encounter: Payer: Self-pay | Admitting: Oncology

## 2021-08-31 VITALS — BP 116/68 | HR 85 | Resp 16 | Wt 165.7 lb

## 2021-08-31 DIAGNOSIS — Z5111 Encounter for antineoplastic chemotherapy: Secondary | ICD-10-CM

## 2021-08-31 DIAGNOSIS — C189 Malignant neoplasm of colon, unspecified: Secondary | ICD-10-CM

## 2021-08-31 LAB — COMPREHENSIVE METABOLIC PANEL
ALT: 18 U/L (ref 0–44)
AST: 24 U/L (ref 15–41)
Albumin: 4.3 g/dL (ref 3.5–5.0)
Alkaline Phosphatase: 59 U/L (ref 38–126)
Anion gap: 4 — ABNORMAL LOW (ref 5–15)
BUN: 13 mg/dL (ref 6–20)
CO2: 26 mmol/L (ref 22–32)
Calcium: 9 mg/dL (ref 8.9–10.3)
Chloride: 105 mmol/L (ref 98–111)
Creatinine, Ser: 0.85 mg/dL (ref 0.61–1.24)
GFR, Estimated: >60 mL/min (ref >60)
Glucose, Bld: 107 mg/dL — ABNORMAL HIGH (ref 70–99)
Potassium: 3.8 mmol/L (ref 3.5–5.1)
Sodium: 135 mmol/L (ref 135–145)
Total Bilirubin: 0.7 mg/dL (ref 0.3–1.2)
Total Protein: 7 g/dL (ref 6.5–8.1)

## 2021-08-31 LAB — CBC WITH DIFFERENTIAL/PLATELET
Abs Immature Granulocytes: 0 10*3/uL (ref 0.00–0.07)
Basophils Absolute: 0 10*3/uL (ref 0.0–0.1)
Basophils Relative: 1 %
Eosinophils Absolute: 0 10*3/uL (ref 0.0–0.5)
Eosinophils Relative: 0 %
HCT: 39.2 % (ref 39.0–52.0)
Hemoglobin: 12.2 g/dL — ABNORMAL LOW (ref 13.0–17.0)
Immature Granulocytes: 0 %
Lymphocytes Relative: 33 %
Lymphs Abs: 1.3 10*3/uL (ref 0.7–4.0)
MCH: 27.2 pg (ref 26.0–34.0)
MCHC: 31.1 g/dL (ref 30.0–36.0)
MCV: 87.3 fL (ref 80.0–100.0)
Monocytes Absolute: 0.4 10*3/uL (ref 0.1–1.0)
Monocytes Relative: 10 %
Neutro Abs: 2.2 10*3/uL (ref 1.7–7.7)
Neutrophils Relative %: 56 %
Platelets: 160 10*3/uL (ref 150–400)
RBC: 4.49 MIL/uL (ref 4.22–5.81)
RDW: 22.2 % — ABNORMAL HIGH (ref 11.5–15.5)
WBC: 3.9 10*3/uL — ABNORMAL LOW (ref 4.0–10.5)
nRBC: 0 % (ref 0.0–0.2)

## 2021-08-31 MED ORDER — HEPARIN SOD (PORK) LOCK FLUSH 100 UNIT/ML IV SOLN
500.0000 [IU] | Freq: Once | INTRAVENOUS | Status: DC
  Filled 2021-08-31: qty 5

## 2021-08-31 MED ORDER — PALONOSETRON HCL INJECTION 0.25 MG/5ML
0.2500 mg | Freq: Once | INTRAVENOUS | Status: AC
  Administered 2021-08-31: 0.25 mg via INTRAVENOUS
  Filled 2021-08-31: qty 5

## 2021-08-31 MED ORDER — LEUCOVORIN CALCIUM INJECTION 350 MG
800.0000 mg | Freq: Once | INTRAVENOUS | Status: AC
  Administered 2021-08-31: 800 mg via INTRAVENOUS
  Filled 2021-08-31: qty 40

## 2021-08-31 MED ORDER — OXALIPLATIN CHEMO INJECTION 100 MG/20ML
85.0000 mg/m2 | Freq: Once | INTRAVENOUS | Status: AC
  Administered 2021-08-31: 165 mg via INTRAVENOUS
  Filled 2021-08-31: qty 33

## 2021-08-31 MED ORDER — FLUOROURACIL CHEMO INJECTION 2.5 GM/50ML
400.0000 mg/m2 | Freq: Once | INTRAVENOUS | Status: AC
  Administered 2021-08-31: 800 mg via INTRAVENOUS
  Filled 2021-08-31: qty 16

## 2021-08-31 MED ORDER — SODIUM CHLORIDE 0.9 % IV SOLN
2400.0000 mg/m2 | INTRAVENOUS | Status: DC
  Administered 2021-08-31: 4700 mg via INTRAVENOUS
  Filled 2021-08-31: qty 94

## 2021-08-31 MED ORDER — DEXTROSE 5 % IV SOLN
Freq: Once | INTRAVENOUS | Status: AC
  Filled 2021-08-31: qty 250

## 2021-08-31 MED ORDER — SODIUM CHLORIDE 0.9 % IV SOLN
10.0000 mg | Freq: Once | INTRAVENOUS | Status: AC
  Administered 2021-08-31: 10 mg via INTRAVENOUS
  Filled 2021-08-31: qty 10

## 2021-08-31 MED ORDER — SODIUM CHLORIDE 0.9% FLUSH
10.0000 mL | Freq: Once | INTRAVENOUS | Status: AC
  Administered 2021-08-31: 10 mL via INTRAVENOUS
  Filled 2021-08-31: qty 10

## 2021-08-31 NOTE — Telephone Encounter (Signed)
Pt caregiver called stating that she was unaware of pts appt this morning. She has missed lab and MD appt, syas that she can still make Chemo appt, please advise...KJ  ?

## 2021-08-31 NOTE — Progress Notes (Signed)
? ? ? ?Hematology/Oncology Consult note ?[place]  ?Telephone:([phone]) [phone] [phone] ? ?Patient Care Team: ?[name], MD as PCP - General (Internal Medicine) ?Sindy Guadeloupe, MD as Consulting Physician (Hematology and Oncology) ?Jules Husbands, MD as Consulting Physician (General Surgery) ?Lin Landsman, MD as Consulting Physician (Gastroenterology) ?Clent Jacks, RN as Oncology Nurse Navigator  ? ?Name of the patient: Connor Wilkinson  ?[id number]  ?May 18, 1985  ? ?Date of visit: 08/31/21 ? 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 ?Chief complaint/ Reason for visit-on treatment assessment prior to cycle 3 of FOLFOX chemotherapy ? ?Heme/Onc history: patient is a 36 year old male with a past medical history significant for mild bipolar disorder who is a ward of the stateAnd was admitted to the hospital for severe anemia.  He was found to have a hemoglobin of 4.5/18.7 on admission.  He received blood transfusion as well as IV iron and his hemoglobin is presently up to an 8.  He underwent colonoscopy which showed malignant partially obstructing tumor that was circumferential 80 cm proximal to the anus.  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  CT chest abdomen and pelvis with contrast did not show any evidence of distant metastatic disease.  2 small sclerotic densities in the acetabulum and proximal right femur likely benign process.  Pathologically enlarged lymph nodes superior to circumferential colonic lesion suggesting metastatic adenopathy. ?  ?Patient underwent right hemicolectomy with Dr. Dahlia Byes on 07/12/2021.  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  Vermiform appendix negative for tumor.  Radial margin positive for tumor (2 lymph nodes at margin  with adenocarcinoma).  Proximal and distal margins negative.  7 out of 56 lymph nodes positive for metastatic adenocarcinoma.  Tumor deposits not applicable.  No lymphovascular or perineural invasion seen.  PT4APN2B ?  ?Fertility conservation was also discussed an option of sperm banking at [facility] was discussed with the patient and his healthcare power of attorney [name].  There would be a one-time cost involved for sperm banking and a yearly cost for preserving the specimen.  Overall chances of infertility with FOLFOX is low but possible.  Patient and his healthcare power of attorney have discussed their options and have decided not to proceed with fertility conservation at this time ?  ? ?Interval history-he is tolerating chemotherapy well without any significant nausea or vomiting.  Reports tingling numbness in his hands which lasts for about 2 days after chemotherapy and then resolves. ? ?ECOG PS- 0 ?Pain scale- 0 ? ? ?Review of systems- Review of Systems  ?Constitutional:  Positive for malaise/fatigue. Negative for chills, fever and weight loss.  ?HENT:  Negative for congestion, ear discharge and nosebleeds.   ?Eyes:  Negative for blurred vision.  ?Respiratory:  Negative for cough, hemoptysis, sputum production, shortness of breath and wheezing.   ?Cardiovascular:  Negative for chest pain, palpitations, orthopnea and claudication.  ?Gastrointestinal:  Negative for abdominal pain, blood in stool, constipation, diarrhea, heartburn, melena, nausea and vomiting.  ?Genitourinary:  Negative for dysuria, flank pain, frequency, hematuria and urgency.  ?Musculoskeletal:  Negative for back pain, joint pain and myalgias.  ?Skin:  Negative for rash.  ?Neurological:  Negative for dizziness, tingling, focal weakness, seizures, weakness and headaches.  ?Endo/Heme/Allergies:  Does not bruise/bleed easily.  ?Psychiatric/Behavioral:  Negative for depression and suicidal ideas. The patient does not have insomnia.     ? ? ? ?  Allergies  ?Allergen Reactions  ? Geodon [Ziprasidone Hcl] Anaphylaxis  ? ? ? ?Past Medical History:  ?Diagnosis Date  ? Bipolar 1 disorder ([place])   ? Colon cancer ([place])   ? PTSD (post-traumatic stress disorder)   ? ? ? ?Past Surgical History:  ?Procedure Laterality Date  ? COLON RESECTION N/A 07/12/2021  ? Procedure: HAND ASSISTED LAPAROSCOPIC COLON RESECTION;  Surgeon: Jules Husbands, MD;  Location: [facility] ORS;  Service: General;  Laterality: N/A;  ? COLONOSCOPY WITH PROPOFOL N/A 07/06/2021  ? Procedure: COLONOSCOPY WITH PROPOFOL;  Surgeon: Lin Landsman, MD;  Location: [facility] ENDOSCOPY;  Service: Gastroenterology;  Laterality: N/A;  ? COLONOSCOPY WITH PROPOFOL N/A 07/07/2021  ? Procedure: COLONOSCOPY WITH PROPOFOL;  Surgeon: Lin Landsman, MD;  Location: [facility] ENDOSCOPY;  Service: Gastroenterology;  Laterality: N/A;  ? ESOPHAGOGASTRODUODENOSCOPY (EGD) WITH PROPOFOL N/A 07/06/2021  ? Procedure: ESOPHAGOGASTRODUODENOSCOPY (EGD) WITH PROPOFOL;  Surgeon: Lin Landsman, MD;  Location: [facility] ENDOSCOPY;  Service: Gastroenterology;  Laterality: N/A;  ? GIVENS CAPSULE STUDY N/A 07/06/2021  ? Procedure: GIVENS CAPSULE STUDY;  Surgeon: Lin Landsman, MD;  Location: [facility] ENDOSCOPY;  Service: Gastroenterology;  Laterality: N/A;  ? PORTACATH PLACEMENT Right 07/22/2021  ? Procedure: INSERTION PORT-A-CATH;  Surgeon: Jules Husbands, MD;  Location: [facility] ORS;  Service: General;  Laterality: Right;  ? ? ?Social History  ? ?Socioeconomic History  ? Marital status: Single  ?  Spouse name: Not on file  ? Number of children: Not on file  ? Years of education: Not on file  ? Highest education level: Not on file  ?Occupational History  ? Not on file  ?Tobacco Use  ? Smoking status: Every Day  ?  Packs/day: 0.25  ?  Types: Cigarettes  ? Smokeless tobacco: Never  ? Tobacco comments:  ?  5 cig a day  ?Vaping Use  ? Vaping Use: Every day  ? Devices: elfbar  ?Substance and Sexual Activity  ? Alcohol use: Not Currently  ? Drug  use: Not Currently  ? Sexual activity: Yes  ?Other Topics Concern  ? Not on file  ?Social History Narrative  ? Not on file  ? ?Social Determinants of Health  ? ?Financial Resource Strain: Not on file  ?Food Insecurity: Not on file  ?Transportation Needs: Not on file  ?Physical Activity: Not on file  ?Stress: Not on file  ?Social Connections: Not on file  ?Intimate Partner Violence: Not on file  ? ? ?Family History  ?Problem Relation Age of Onset  ? Kidney cancer Father   ? Breast cancer Maternal Aunt   ? ? ? ?Current Outpatient Medications:  ?  ARIPiprazole ER (ABILIFY MAINTENA) 400 MG PRSY prefilled syringe, Inject 400 mg into the muscle every 21 ( twenty-one) days., Disp: , Rfl:  ?  benztropine (COGENTIN) 2 MG tablet, Take 2 mg by mouth at bedtime., Disp: , Rfl:  ?  busPIRone (BUSPAR) 10 MG tablet, Take 10 mg by mouth 2 (two) times daily., Disp: , Rfl:  ?  dexamethasone (DECADRON) 4 MG tablet, Take 2 tablets (8 mg total) by mouth daily. Start the day after chemotherapy for 2 days. Take with food., Disp: 30 tablet, Rfl: 1 ?  FLUoxetine (PROZAC) 20 MG capsule, Take 20 mg by mouth daily., Disp: , Rfl:  ?  ibuprofen (ADVIL) 600 MG tablet, Take 1 tablet (600 mg total) by mouth every 6 (six) hours as needed., Disp: 30 tablet, Rfl: 0 ?  lidocaine-prilocaine (EMLA) cream, Apply to affected area once,  Disp: 30 g, Rfl: 3 ?  polyethylene glycol (MIRALAX / GLYCOLAX) 17 g packet, Take 17 g by mouth daily as needed (constipation)., Disp: 14 each, Rfl: 0 ?  prazosin (MINIPRESS) 2 MG capsule, Take 2 mg by mouth 2 (two) times daily., Disp: , Rfl:  ?  ondansetron (ZOFRAN) 8 MG tablet, Take 1 tablet (8 mg total) by mouth 2 (two) times daily as needed for refractory nausea / vomiting. Start on day 3 after chemotherapy. (Patient not taking: Reported on 08/17/2021), Disp: 30 tablet, Rfl: 1 ?  oxyCODONE (OXY IR/ROXICODONE) 5 MG immediate release tablet, Take 1 tablet (5 mg total) by mouth every 6 (six) hours as needed for severe pain or  breakthrough pain. (Patient not taking: Reported on 08/17/2021), Disp: 25 tablet, Rfl: 0 ?  prochlorperazine (COMPAZINE) 10 MG tablet, Take 1 tablet (10 mg total) by mouth every 6 (six) hours as needed (Nausea or vomit

## 2021-08-31 NOTE — Progress Notes (Signed)
Pt states on Sunday morning lots of mucus;nostrils are scabing up; vomiiting due to mucus. Only thing he is able to taste is coffee; everything else taste "nasty" ?

## 2021-08-31 NOTE — Patient Instructions (Signed)
[facility]CER CTR AT [place]  Discharge Instructions: ?Thank you for choosing [facility] to provide your oncology and hematology care.  ?If you have a lab appointment with the [facility], please go directly to the [facility] and check in at the registration area. ? ?Wear comfortable clothing and clothing appropriate for easy access to any Portacath or PICC line.  ? ?We strive to give you quality time with your provider. You may need to reschedule your appointment if you arrive late (15 or more minutes).  Arriving late affects you and other patients whose appointments are after yours.  Also, if you miss three or more appointments without notifying the office, you may be dismissed from the clinic at the provider?s discretion.    ?  ?For prescription refill requests, have your pharmacy contact our office and allow 72 hours for refills to be completed.   ? ?Today you received the following chemotherapy and/or immunotherapy agents: FOLFOX    ?  ?To help prevent nausea and vomiting after your treatment, we encourage you to take your nausea medication as directed. ? ?BELOW ARE SYMPTOMS THAT SHOULD BE REPORTED IMMEDIATELY: ?*FEVER GREATER THAN 100.4 F (38 ?C) OR HIGHER ?*CHILLS OR SWEATING ?*NAUSEA AND VOMITING THAT IS NOT CONTROLLED WITH YOUR NAUSEA MEDICATION ?*UNUSUAL SHORTNESS OF BREATH ?*UNUSUAL BRUISING OR BLEEDING ?*URINARY PROBLEMS (pain or burning when urinating, or frequent urination) ?*BOWEL PROBLEMS (unusual diarrhea, constipation, pain near the anus) ?TENDERNESS IN MOUTH AND THROAT WITH OR WITHOUT PRESENCE OF ULCERS (sore throat, sores in mouth, or a toothache) ?UNUSUAL RASH, SWELLING OR PAIN  ?UNUSUAL VAGINAL DISCHARGE OR ITCHING  ? ?Items with * indicate a potential emergency and should be followed up as soon as possible or go to the Emergency Department if any problems should occur. ? ?Please show the CHEMOTHERAPY ALERT CARD or IMMUNOTHERAPY ALERT CARD at check-in to the  Emergency Department and triage nurse. ? ?Should you have questions after your visit or need to cancel or reschedule your appointment, please contact [facility] CANCER [place] AT [place]  [phone] and follow the prompts.  Office hours are 8:00 a.m. to 4:30 p.m. Monday - Friday. Please note that voicemails left after 4:00 p.m. may not be returned until the following business day.  We are closed weekends and major holidays. You have access to a nurse at all times for urgent questions. Please call the main number to the clinic [phone] and follow the prompts. ? ?For any non-urgent questions, you may also contact your provider using MyChart. We now offer e-Visits for anyone 83 and older to request care online for non-urgent symptoms. For details visit [email or web address]. ?  ?Also download the MyChart app! Go to the app store, search "MyChart", open the app, select [place], and log in with your MyChart username and password. ? ?Due to Covid, a mask is required upon entering the hospital/clinic. If you do not have a mask, one will be given to you upon arrival. For doctor visits, patients may have 1 support person aged 3 or older with them. For treatment visits, patients cannot have anyone with them due to current Covid guidelines and our immunocompromised population.  ?

## 2021-09-02 ENCOUNTER — Other Ambulatory Visit: Payer: Self-pay | Admitting: Oncology

## 2021-09-02 ENCOUNTER — Inpatient Hospital Stay: Payer: Medicaid Other

## 2021-09-02 VITALS — BP 100/59 | HR 79 | Temp 97.0°F

## 2021-09-02 DIAGNOSIS — C189 Malignant neoplasm of colon, unspecified: Secondary | ICD-10-CM

## 2021-09-02 DIAGNOSIS — D508 Other iron deficiency anemias: Secondary | ICD-10-CM

## 2021-09-02 DIAGNOSIS — Z5111 Encounter for antineoplastic chemotherapy: Secondary | ICD-10-CM | POA: Diagnosis not present

## 2021-09-02 MED ORDER — SODIUM CHLORIDE 0.9 % IV SOLN
Freq: Once | INTRAVENOUS | Status: AC
  Filled 2021-09-02: qty 250

## 2021-09-02 MED ORDER — SODIUM CHLORIDE 0.9 % IV SOLN
510.0000 mg | INTRAVENOUS | Status: DC
  Administered 2021-09-02: 510 mg via INTRAVENOUS
  Filled 2021-09-02: qty 510

## 2021-09-02 MED ORDER — HEPARIN SOD (PORK) LOCK FLUSH 100 UNIT/ML IV SOLN
500.0000 [IU] | Freq: Once | INTRAVENOUS | Status: AC | PRN
  Filled 2021-09-02: qty 5

## 2021-09-02 MED ORDER — SODIUM CHLORIDE 0.9% FLUSH
10.0000 mL | INTRAVENOUS | Status: DC | PRN
  Administered 2021-09-02: 10 mL
  Filled 2021-09-02: qty 10

## 2021-09-02 MED ORDER — HEPARIN SOD (PORK) LOCK FLUSH 100 UNIT/ML IV SOLN
INTRAVENOUS | Status: AC
  Administered 2021-09-02: 500 [IU]
  Filled 2021-09-02: qty 5

## 2021-09-02 NOTE — Patient Instructions (Signed)

## 2021-09-13 MED FILL — Dexamethasone Sodium Phosphate Inj 100 MG/10ML: INTRAMUSCULAR | Qty: 1 | Status: AC

## 2021-09-14 ENCOUNTER — Inpatient Hospital Stay: Payer: Medicaid Other

## 2021-09-14 ENCOUNTER — Inpatient Hospital Stay (HOSPITAL_BASED_OUTPATIENT_CLINIC_OR_DEPARTMENT_OTHER): Payer: Medicaid Other | Admitting: Medical Oncology

## 2021-09-14 ENCOUNTER — Encounter: Payer: Self-pay | Admitting: Medical Oncology

## 2021-09-14 VITALS — BP 107/68 | HR 80 | Temp 96.3°F | Resp 18 | Wt 161.0 lb

## 2021-09-14 DIAGNOSIS — Z5111 Encounter for antineoplastic chemotherapy: Secondary | ICD-10-CM

## 2021-09-14 DIAGNOSIS — D508 Other iron deficiency anemias: Secondary | ICD-10-CM | POA: Diagnosis not present

## 2021-09-14 DIAGNOSIS — C189 Malignant neoplasm of colon, unspecified: Secondary | ICD-10-CM

## 2021-09-14 LAB — COMPREHENSIVE METABOLIC PANEL
ALT: 22 U/L (ref 0–44)
AST: 23 U/L (ref 15–41)
Albumin: 4 g/dL (ref 3.5–5.0)
Alkaline Phosphatase: 61 U/L (ref 38–126)
Anion gap: 6 (ref 5–15)
BUN: 13 mg/dL (ref 6–20)
CO2: 29 mmol/L (ref 22–32)
Calcium: 9 mg/dL (ref 8.9–10.3)
Chloride: 102 mmol/L (ref 98–111)
Creatinine, Ser: 0.98 mg/dL (ref 0.61–1.24)
GFR, Estimated: >60 mL/min (ref >60)
Glucose, Bld: 84 mg/dL (ref 70–99)
Potassium: 3.9 mmol/L (ref 3.5–5.1)
Sodium: 137 mmol/L (ref 135–145)
Total Bilirubin: 0.3 mg/dL (ref 0.3–1.2)
Total Protein: 6.6 g/dL (ref 6.5–8.1)

## 2021-09-14 LAB — CBC WITH DIFFERENTIAL/PLATELET
Abs Immature Granulocytes: 0.01 10*3/uL (ref 0.00–0.07)
Basophils Absolute: 0 10*3/uL (ref 0.0–0.1)
Basophils Relative: 0 %
Eosinophils Absolute: 0 10*3/uL (ref 0.0–0.5)
Eosinophils Relative: 1 %
HCT: 39 % (ref 39.0–52.0)
Hemoglobin: 12.5 g/dL — ABNORMAL LOW (ref 13.0–17.0)
Immature Granulocytes: 0 %
Lymphocytes Relative: 29 %
Lymphs Abs: 1.4 10*3/uL (ref 0.7–4.0)
MCH: 28.6 pg (ref 26.0–34.0)
MCHC: 32.1 g/dL (ref 30.0–36.0)
MCV: 89.2 fL (ref 80.0–100.0)
Monocytes Absolute: 0.7 10*3/uL (ref 0.1–1.0)
Monocytes Relative: 14 %
Neutro Abs: 2.7 10*3/uL (ref 1.7–7.7)
Neutrophils Relative %: 56 %
Platelets: 152 10*3/uL (ref 150–400)
RBC: 4.37 MIL/uL (ref 4.22–5.81)
RDW: 19.8 % — ABNORMAL HIGH (ref 11.5–15.5)
WBC: 4.7 10*3/uL (ref 4.0–10.5)
nRBC: 0 % (ref 0.0–0.2)

## 2021-09-14 MED ORDER — OXALIPLATIN CHEMO INJECTION 100 MG/20ML
85.0000 mg/m2 | Freq: Once | INTRAVENOUS | Status: AC
  Administered 2021-09-14: 165 mg via INTRAVENOUS
  Filled 2021-09-14: qty 33

## 2021-09-14 MED ORDER — PALONOSETRON HCL INJECTION 0.25 MG/5ML
0.2500 mg | Freq: Once | INTRAVENOUS | Status: AC
  Administered 2021-09-14: 0.25 mg via INTRAVENOUS
  Filled 2021-09-14: qty 5

## 2021-09-14 MED ORDER — SODIUM CHLORIDE 0.9 % IV SOLN
2400.0000 mg/m2 | INTRAVENOUS | Status: DC
  Administered 2021-09-14: 4700 mg via INTRAVENOUS
  Filled 2021-09-14: qty 94

## 2021-09-14 MED ORDER — DEXTROSE 5 % IV SOLN
Freq: Once | INTRAVENOUS | Status: AC
  Filled 2021-09-14: qty 250

## 2021-09-14 MED ORDER — SODIUM CHLORIDE 0.9% FLUSH
10.0000 mL | INTRAVENOUS | Status: DC | PRN
  Administered 2021-09-14: 10 mL via INTRAVENOUS
  Filled 2021-09-14: qty 10

## 2021-09-14 MED ORDER — FLUOROURACIL CHEMO INJECTION 2.5 GM/50ML
400.0000 mg/m2 | Freq: Once | INTRAVENOUS | Status: AC
  Administered 2021-09-14: 800 mg via INTRAVENOUS
  Filled 2021-09-14: qty 16

## 2021-09-14 MED ORDER — LEUCOVORIN CALCIUM INJECTION 350 MG
800.0000 mg | Freq: Once | INTRAVENOUS | Status: AC
  Administered 2021-09-14: 800 mg via INTRAVENOUS
  Filled 2021-09-14: qty 40

## 2021-09-14 MED ORDER — SODIUM CHLORIDE 0.9 % IV SOLN
10.0000 mg | Freq: Once | INTRAVENOUS | Status: AC
  Administered 2021-09-14: 10 mg via INTRAVENOUS
  Filled 2021-09-14: qty 10

## 2021-09-14 NOTE — Progress Notes (Signed)
Pt states he had a reaction sometime last week not sure if it was viral (vomiting, nausea, diarrhea) but sxs are better today.

## 2021-09-14 NOTE — Patient Instructions (Signed)
[facility]  Discharge Instructions: ?Thank you for choosing [facility] to provide your oncology and hematology care.  ?If you have a lab appointment with the Cancer Center, please go directly to the Cancer Center and check in at the registration area. ? ?Wear comfortable clothing and clothing appropriate for easy access to any Portacath or PICC line.  ? ?We strive to give you quality time with your provider. You may need to reschedule your appointment if you arrive late (15 or more minutes).  Arriving late affects you and other patients whose appointments are after yours.  Also, if you miss three or more appointments without notifying the office, you may be dismissed from the clinic at the provider?s discretion.    ?  ?For prescription refill requests, have your pharmacy contact our office and allow 72 hours for refills to be completed.   ? ?  ?To help prevent nausea and vomiting after your treatment, we encourage you to take your nausea medication as directed. ? ?BELOW ARE SYMPTOMS THAT SHOULD BE REPORTED IMMEDIATELY: ?*FEVER GREATER THAN 100.4 F (38 ?C) OR HIGHER ?*CHILLS OR SWEATING ?*NAUSEA AND VOMITING THAT IS NOT CONTROLLED WITH YOUR NAUSEA MEDICATION ?*UNUSUAL SHORTNESS OF BREATH ?*UNUSUAL BRUISING OR BLEEDING ?*URINARY PROBLEMS (pain or burning when urinating, or frequent urination) ?*BOWEL PROBLEMS (unusual diarrhea, constipation, pain near the anus) ?TENDERNESS IN MOUTH AND THROAT WITH OR WITHOUT PRESENCE OF ULCERS (sore throat, sores in mouth, or a toothache) ?UNUSUAL RASH, SWELLING OR PAIN  ?UNUSUAL VAGINAL DISCHARGE OR ITCHING  ? ?Items with * indicate a potential emergency and should be followed up as soon as possible or go to the Emergency Department if any problems should occur. ? ?Please show the CHEMOTHERAPY ALERT CARD or IMMUNOTHERAPY ALERT CARD at check-in to the Emergency Department and triage nurse. ? ?Should you have questions after your visit  or need to cancel or reschedule your appointment, please contact [facility]  [phone] and follow the prompts.  Office hours are 8:00 a.m. to 4:30 p.m. Monday - Friday. Please note that voicemails left after 4:00 p.m. may not be returned until the following business day.  We are closed weekends and major holidays. You have access to a nurse at all times for urgent questions. Please call the main number to the clinic [phone] and follow the prompts. ? ?For any non-urgent questions, you may also contact your provider using MyChart. We now offer e-Visits for anyone 18 and older to request care online for non-urgent symptoms. For details visit [email or web address]. ?  ?Also download the MyChart app! Go to the app store, search "MyChart", open the app, select [facility], and log in with your MyChart username and password. ? ?Due to Covid, a mask is required upon entering the hospital/clinic. If you do not have a mask, one will be given to you upon arrival. For doctor visits, patients may have 1 support person aged 18 or older with them. For treatment visits, patients cannot have anyone with them due to current Covid guidelines and our immunocompromised population.  ?

## 2021-09-14 NOTE — Progress Notes (Signed)
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  He was found to have a hemoglobin of 4.5/18.7 on admission.  He received blood transfusion as well as IV iron and his hemoglobin is presently up to an 8.  He underwent colonoscopy which showed malignant partially obstructing tumor that was circumferential 80 cm proximal to the anus.  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  CT chest abdomen and pelvis with contrast did not show any evidence of distant metastatic disease.  2 small sclerotic densities in the acetabulum and proximal right femur likely benign process.  Pathologically enlarged lymph nodes superior to circumferential colonic lesion suggesting metastatic adenopathy.   Patient underwent right hemicolectomy with Dr. Dahlia Byes on 07/12/2021.  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  Vermiform appendix negative for tumor.  Radial margin positive for tumor (2 lymph nodes at margin  with adenocarcinoma).  Proximal and distal margins negative.  7 out of 56 lymph nodes positive for metastatic adenocarcinoma.  Tumor deposits not applicable.  No lymphovascular or perineural invasion seen.  [id number]   Fertility conservation was also discussed an option of sperm banking at [facility] was discussed with the patient and his healthcare power of attorney [name].  There would be a one-time cost involved for sperm banking and a yearly cost for preserving the specimen.  Overall chances of infertility with FOLFOX is low but possible.  Patient and his healthcare power of attorney have discussed their options and have decided not to proceed with fertility conservation at this time    Interval history- Today he states that he is doing well. Over the weekend he had a few episodes of non-bloody vomiting and diarrhea however a few close contacts had similar symptoms and he suspects that this was due to a viral illness. Symptoms have fully resolved. No fevers, [place], abd pain. Wishes to proceed with Cycle 4 of Folfox treatment today.    ECOG PS- 0 Pain scale- 0   Review of systems- Review of Systems  Constitutional:  Negative for chills, fever, malaise/fatigue and weight loss.  HENT:  Negative for congestion, ear discharge and nosebleeds.   Eyes:  Negative for blurred vision.  Respiratory:  Negative for cough, hemoptysis, sputum production, shortness of breath and wheezing.   Cardiovascular:  Negative for chest pain, palpitations, orthopnea and claudication.  Gastrointestinal:  Negative for abdominal pain, blood in stool, constipation, diarrhea, heartburn, melena, nausea and vomiting.  Genitourinary:  Negative for dysuria, flank pain, frequency, hematuria and urgency.  Musculoskeletal:  Negative for back pain, joint pain and myalgias.  Skin:  Negative for rash.  Neurological:  Negative for  dizziness, tingling, focal weakness, seizures, weakness and headaches.  Endo/Heme/Allergies:  Does not  bruise/bleed easily.  Psychiatric/Behavioral:  Negative for depression and suicidal ideas. The patient does not have insomnia.       Allergies  Allergen Reactions   Geodon [Ziprasidone Hcl] Anaphylaxis     Past Medical History:  Diagnosis Date   Bipolar 1 disorder ([place])    Colon cancer ([place])    PTSD (post-traumatic stress disorder)      Past Surgical History:  Procedure Laterality Date   COLON RESECTION N/A 07/12/2021   Procedure: HAND ASSISTED LAPAROSCOPIC COLON RESECTION;  Surgeon: Jules Husbands, MD;  Location: [facility] ORS;  Service: General;  Laterality: N/A;   COLONOSCOPY WITH PROPOFOL N/A 07/06/2021   Procedure: COLONOSCOPY WITH PROPOFOL;  Surgeon: Lin Landsman, MD;  Location: [facility] ENDOSCOPY;  Service: Gastroenterology;  Laterality: N/A;   COLONOSCOPY WITH PROPOFOL N/A 07/07/2021   Procedure: COLONOSCOPY WITH PROPOFOL;  Surgeon: Lin Landsman, MD;  Location: [facility] ENDOSCOPY;  Service: Gastroenterology;  Laterality: N/A;   ESOPHAGOGASTRODUODENOSCOPY (EGD) WITH PROPOFOL N/A 07/06/2021   Procedure: ESOPHAGOGASTRODUODENOSCOPY (EGD) WITH PROPOFOL;  Surgeon: Lin Landsman, MD;  Location: [facility] ENDOSCOPY;  Service: Gastroenterology;  Laterality: N/A;   GIVENS CAPSULE STUDY N/A 07/06/2021   Procedure: GIVENS CAPSULE STUDY;  Surgeon: Lin Landsman, MD;  Location: [facility] ENDOSCOPY;  Service: Gastroenterology;  Laterality: N/A;   PORTACATH PLACEMENT Right 07/22/2021   Procedure: INSERTION PORT-A-CATH;  Surgeon: Jules Husbands, MD;  Location: [facility] ORS;  Service: General;  Laterality: Right;    Social History   Socioeconomic History   Marital status: Single    Spouse name: Not on file   Number of children: Not on file   Years of education: Not on file   Highest education level: Not on file  Occupational History   Not on file  Tobacco Use   Smoking status: Every Day    Packs/day: 0.25    Types: Cigarettes   Smokeless tobacco: Never   Tobacco comments:    5 cig a  day  Vaping Use   Vaping Use: Every day   Devices: elfbar  Substance and Sexual Activity   Alcohol use: Not Currently   Drug use: Not Currently   Sexual activity: Yes  Other Topics Concern   Not on file  Social History Narrative   Not on file   Social Determinants of Health   Financial Resource Strain: Not on file  Food Insecurity: Not on file  Transportation Needs: Not on file  Physical Activity: Not on file  Stress: Not on file  Social Connections: Not on file  Intimate Partner Violence: Not on file    Family History  Problem Relation Age of Onset   Kidney cancer Father    Breast cancer Maternal Aunt      Current Outpatient Medications:    ARIPiprazole ER (ABILIFY MAINTENA) 400 MG PRSY prefilled syringe, Inject 400 mg into the muscle every 21 ( twenty-one) days., Disp: , Rfl:    benztropine (COGENTIN) 2 MG tablet, Take 2 mg by mouth at bedtime., Disp: , Rfl:    busPIRone (BUSPAR) 10 MG tablet, Take 10 mg by mouth 2 (two) times daily., Disp: , Rfl:    dexamethasone (DECADRON) 4 MG tablet, Take 2 tablets (8 mg total) by mouth daily. Start the day after chemotherapy for 2 days. Take with food., Disp: 30 tablet, Rfl: 1   FLUoxetine (PROZAC) 20 MG capsule, Take 20 mg by mouth daily., Disp: , Rfl:  ibuprofen (ADVIL) 600 MG tablet, Take 1 tablet (600 mg total) by mouth every 6 (six) hours as needed., Disp: 30 tablet, Rfl: 0   lidocaine-prilocaine (EMLA) cream, Apply to affected area once, Disp: 30 g, Rfl: 3   ondansetron (ZOFRAN) 8 MG tablet, Take 1 tablet (8 mg total) by mouth 2 (two) times daily as needed for refractory nausea / vomiting. Start on day 3 after chemotherapy., Disp: 30 tablet, Rfl: 1   polyethylene glycol (MIRALAX / GLYCOLAX) 17 g packet, Take 17 g by mouth daily as needed (constipation)., Disp: 14 each, Rfl: 0   prazosin (MINIPRESS) 2 MG capsule, Take 2 mg by mouth 2 (two) times daily., Disp: , Rfl:    prochlorperazine (COMPAZINE) 10 MG tablet, Take 1 tablet  (10 mg total) by mouth every 6 (six) hours as needed (Nausea or vomiting)., Disp: 30 tablet, Rfl: 1   oxyCODONE (OXY IR/ROXICODONE) 5 MG immediate release tablet, Take 1 tablet (5 mg total) by mouth every 6 (six) hours as needed for severe pain or breakthrough pain. (Patient not taking: Reported on 08/17/2021), Disp: 25 tablet, Rfl: 0 No current facility-administered medications for this visit.  Facility-Administered Medications Ordered in Other Visits:    dexamethasone (DECADRON) 10 mg in sodium chloride 0.9 % 50 mL IVPB, 10 mg, Intravenous, Once, Sindy Guadeloupe, MD, Last Rate: 204 mL/hr at 09/14/21 0944, 10 mg at 09/14/21 0944   fluorouracil (ADRUCIL) 4,700 mg in sodium chloride 0.9 % 56 mL chemo infusion, 2,400 mg/m2 (Treatment Plan Recorded), Intravenous, 1 day or 1 dose, Sindy Guadeloupe, MD   fluorouracil (ADRUCIL) chemo injection 800 mg, 400 mg/m2 (Treatment Plan Recorded), Intravenous, Once, Sindy Guadeloupe, MD   leucovorin 800 mg in dextrose 5 % 250 mL infusion, 800 mg, Intravenous, Once, Sindy Guadeloupe, MD   oxaliplatin (ELOXATIN) 165 mg in dextrose 5 % 500 mL chemo infusion, 85 mg/m2 (Treatment Plan Recorded), Intravenous, Once, Sindy Guadeloupe, MD   sodium chloride flush (NS) 0.9 % injection 10 mL, 10 mL, Intravenous, PRN, Sindy Guadeloupe, MD, 10 mL at 09/14/21 0835  Physical exam:  Vitals:   09/14/21 0906  BP: 107/68  Pulse: 80  Resp: 18  Temp: (!) 96.3 F (35.7 C)  SpO2: 99%  Weight: 161 lb (73 kg)   Physical Exam Constitutional:      General: He is not in acute distress. Cardiovascular:     Rate and Rhythm: Normal rate and regular rhythm.     Heart sounds: Normal heart sounds.  Pulmonary:     Effort: Pulmonary effort is normal.     Breath sounds: Normal breath sounds.  Abdominal:     General: Bowel sounds are normal.     Palpations: Abdomen is soft.  Skin:    General: Skin is warm and dry.  Neurological:     Mental Status: He is alert and oriented to person, place, and  time.        Latest Ref Rng & Units 09/14/2021    8:35 AM  CMP  Glucose 70 - 99 mg/dL 84    BUN 6 - 20 mg/dL 13    Creatinine 0.61 - 1.24 mg/dL 0.98    Sodium 135 - 145 mmol/L 137    Potassium 3.5 - 5.1 mmol/L 3.9    Chloride 98 - 111 mmol/L 102    CO2 22 - 32 mmol/L 29    Calcium 8.9 - 10.3 mg/dL 9.0    Total Protein 6.5 - 8.1 g/dL 6.6  Total Bilirubin 0.3 - 1.2 mg/dL 0.3    Alkaline Phos 38 - 126 U/L 61    AST 15 - 41 U/L 23    ALT 0 - 44 U/L 22        Latest Ref Rng & Units 09/14/2021    8:35 AM  CBC  WBC 4.0 - 10.5 K/uL 4.7    Hemoglobin 13.0 - 17.0 g/dL 12.5    Hematocrit 39.0 - 52.0 % 39.0    Platelets 150 - 400 K/uL 152       Assessment and plan- Patient is a 36 y.o. male with h/o diagnosed stage IIIc adenocarcinoma of the colon [id number] s/p right hemicolectomy.  He is here for on treatment assessment prior to cycle 3 of adjuvant FOLFOX chemotherapy  Patient continues to tolerate his chemotherapy well. ANC, Hgb and WBC stable today and acceptable for Folfox treatment today. We will continue to monitor his ANC and WBC and start Udenyca if appropriate. He will follow up in 2 weeks with Dr. Janese Banks with labs (CBC, CMP) and consideration of cycle 5 of adjuvant Folfox.    Iron deficiency anemia: Hemoglobin improved after IV iron supplementation.  Counts stable. Continue to monitor   Visit Diagnosis 1. Adenocarcinoma of colon ([place])   2. Other iron deficiency anemia   3. Encounter for antineoplastic chemotherapy      Hughie Closs PA-C  [facility] 09/14/2021 9:51 AM

## 2021-09-16 ENCOUNTER — Inpatient Hospital Stay: Payer: Medicaid Other | Attending: Oncology

## 2021-09-16 VITALS — BP 128/79 | HR 92 | Resp 18

## 2021-09-16 DIAGNOSIS — D509 Iron deficiency anemia, unspecified: Secondary | ICD-10-CM | POA: Diagnosis not present

## 2021-09-16 DIAGNOSIS — F17210 Nicotine dependence, cigarettes, uncomplicated: Secondary | ICD-10-CM | POA: Insufficient documentation

## 2021-09-16 DIAGNOSIS — Z5111 Encounter for antineoplastic chemotherapy: Secondary | ICD-10-CM | POA: Insufficient documentation

## 2021-09-16 DIAGNOSIS — G620 Drug-induced polyneuropathy: Secondary | ICD-10-CM | POA: Insufficient documentation

## 2021-09-16 DIAGNOSIS — C189 Malignant neoplasm of colon, unspecified: Secondary | ICD-10-CM | POA: Insufficient documentation

## 2021-09-16 DIAGNOSIS — Z803 Family history of malignant neoplasm of breast: Secondary | ICD-10-CM | POA: Insufficient documentation

## 2021-09-16 DIAGNOSIS — Z8051 Family history of malignant neoplasm of kidney: Secondary | ICD-10-CM | POA: Diagnosis not present

## 2021-09-16 MED ORDER — SODIUM CHLORIDE 0.9% FLUSH
10.0000 mL | INTRAVENOUS | Status: DC | PRN
  Administered 2021-09-16: 10 mL
  Filled 2021-09-16: qty 10

## 2021-09-16 MED ORDER — HEPARIN SOD (PORK) LOCK FLUSH 100 UNIT/ML IV SOLN
500.0000 [IU] | Freq: Once | INTRAVENOUS | Status: AC | PRN
  Administered 2021-09-16: 500 [IU]
  Filled 2021-09-16: qty 5

## 2021-09-28 ENCOUNTER — Encounter: Payer: Self-pay | Admitting: Oncology

## 2021-09-28 ENCOUNTER — Inpatient Hospital Stay: Payer: Medicaid Other

## 2021-09-28 ENCOUNTER — Inpatient Hospital Stay (HOSPITAL_BASED_OUTPATIENT_CLINIC_OR_DEPARTMENT_OTHER): Payer: Medicaid Other | Admitting: Oncology

## 2021-09-28 VITALS — BP 112/80 | HR 86 | Temp 98.2°F | Resp 18 | Wt 169.0 lb

## 2021-09-28 DIAGNOSIS — C189 Malignant neoplasm of colon, unspecified: Secondary | ICD-10-CM

## 2021-09-28 DIAGNOSIS — G620 Drug-induced polyneuropathy: Secondary | ICD-10-CM

## 2021-09-28 DIAGNOSIS — Z5111 Encounter for antineoplastic chemotherapy: Secondary | ICD-10-CM

## 2021-09-28 DIAGNOSIS — F17210 Nicotine dependence, cigarettes, uncomplicated: Secondary | ICD-10-CM | POA: Diagnosis not present

## 2021-09-28 DIAGNOSIS — D509 Iron deficiency anemia, unspecified: Secondary | ICD-10-CM | POA: Diagnosis not present

## 2021-09-28 DIAGNOSIS — C779 Secondary and unspecified malignant neoplasm of lymph node, unspecified: Secondary | ICD-10-CM | POA: Diagnosis not present

## 2021-09-28 LAB — COMPREHENSIVE METABOLIC PANEL
ALT: 30 U/L (ref 0–44)
AST: 30 U/L (ref 15–41)
Albumin: 3.9 g/dL (ref 3.5–5.0)
Alkaline Phosphatase: 60 U/L (ref 38–126)
Anion gap: 6 (ref 5–15)
BUN: 11 mg/dL (ref 6–20)
CO2: 28 mmol/L (ref 22–32)
Calcium: 9.3 mg/dL (ref 8.9–10.3)
Chloride: 105 mmol/L (ref 98–111)
Creatinine, Ser: 0.98 mg/dL (ref 0.61–1.24)
GFR, Estimated: >60 mL/min (ref >60)
Glucose, Bld: 85 mg/dL (ref 70–99)
Potassium: 4.2 mmol/L (ref 3.5–5.1)
Sodium: 139 mmol/L (ref 135–145)
Total Bilirubin: 0.4 mg/dL (ref 0.3–1.2)
Total Protein: 6.3 g/dL — ABNORMAL LOW (ref 6.5–8.1)

## 2021-09-28 LAB — CBC WITH DIFFERENTIAL/PLATELET
Abs Immature Granulocytes: 0.01 10*3/uL (ref 0.00–0.07)
Basophils Absolute: 0 10*3/uL (ref 0.0–0.1)
Basophils Relative: 1 %
Eosinophils Absolute: 0 10*3/uL (ref 0.0–0.5)
Eosinophils Relative: 1 %
HCT: 39 % (ref 39.0–52.0)
Hemoglobin: 12.6 g/dL — ABNORMAL LOW (ref 13.0–17.0)
Immature Granulocytes: 0 %
Lymphocytes Relative: 37 %
Lymphs Abs: 1.6 10*3/uL (ref 0.7–4.0)
MCH: 29.4 pg (ref 26.0–34.0)
MCHC: 32.3 g/dL (ref 30.0–36.0)
MCV: 91.1 fL (ref 80.0–100.0)
Monocytes Absolute: 0.7 10*3/uL (ref 0.1–1.0)
Monocytes Relative: 16 %
Neutro Abs: 1.9 10*3/uL (ref 1.7–7.7)
Neutrophils Relative %: 45 %
Platelets: 154 10*3/uL (ref 150–400)
RBC: 4.28 MIL/uL (ref 4.22–5.81)
RDW: 19.6 % — ABNORMAL HIGH (ref 11.5–15.5)
WBC: 4.2 10*3/uL (ref 4.0–10.5)
nRBC: 0 % (ref 0.0–0.2)

## 2021-09-28 MED ORDER — SODIUM CHLORIDE 0.9 % IV SOLN
2400.0000 mg/m2 | INTRAVENOUS | Status: DC
  Administered 2021-09-28: 4700 mg via INTRAVENOUS
  Filled 2021-09-28: qty 94

## 2021-09-28 MED ORDER — OXALIPLATIN CHEMO INJECTION 100 MG/20ML
85.0000 mg/m2 | Freq: Once | INTRAVENOUS | Status: AC
  Administered 2021-09-28: 165 mg via INTRAVENOUS
  Filled 2021-09-28: qty 33

## 2021-09-28 MED ORDER — SODIUM CHLORIDE 0.9% FLUSH
10.0000 mL | INTRAVENOUS | Status: DC | PRN
  Administered 2021-09-28: 10 mL via INTRAVENOUS
  Filled 2021-09-28: qty 10

## 2021-09-28 MED ORDER — LEUCOVORIN CALCIUM INJECTION 350 MG
800.0000 mg | Freq: Once | INTRAVENOUS | Status: AC
  Administered 2021-09-28: 800 mg via INTRAVENOUS
  Filled 2021-09-28: qty 40

## 2021-09-28 MED ORDER — FLUOROURACIL CHEMO INJECTION 2.5 GM/50ML
400.0000 mg/m2 | Freq: Once | INTRAVENOUS | Status: AC
  Administered 2021-09-28: 800 mg via INTRAVENOUS
  Filled 2021-09-28: qty 16

## 2021-09-28 MED ORDER — DEXTROSE 5 % IV SOLN
Freq: Once | INTRAVENOUS | Status: AC
  Filled 2021-09-28: qty 250

## 2021-09-28 MED ORDER — SODIUM CHLORIDE 0.9 % IV SOLN
10.0000 mg | Freq: Once | INTRAVENOUS | Status: AC
  Administered 2021-09-28: 10 mg via INTRAVENOUS
  Filled 2021-09-28: qty 10

## 2021-09-28 MED ORDER — PALONOSETRON HCL INJECTION 0.25 MG/5ML
0.2500 mg | Freq: Once | INTRAVENOUS | Status: AC
  Administered 2021-09-28: 0.25 mg via INTRAVENOUS
  Filled 2021-09-28: qty 5

## 2021-09-28 NOTE — Progress Notes (Signed)
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  He was found to have a hemoglobin of 4.5/18.7 on admission.  He received blood transfusion as well as IV iron and his hemoglobin is presently up to an 8.  He underwent colonoscopy which showed malignant partially obstructing tumor that was circumferential 80 cm proximal to the anus.  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  CT chest abdomen and pelvis with contrast did not show any evidence of distant metastatic disease.  2 small sclerotic densities in the acetabulum and proximal right femur likely benign process.  Pathologically enlarged lymph nodes superior to circumferential colonic lesion suggesting metastatic adenopathy.   Patient underwent right hemicolectomy with Dr. Dahlia Byes on 07/12/2021.  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  Vermiform appendix negative for tumor.  Radial margin positive for tumor (2 lymph nodes at  margin with adenocarcinoma).  Proximal and distal margins negative.  7 out of 56 lymph nodes positive for metastatic adenocarcinoma.  Tumor deposits not applicable.  No lymphovascular or perineural invasion seen.  [id number]   Fertility conservation was also discussed an option of sperm banking at [facility] was discussed with the patient and his healthcare power of attorney [name].  There would be a one-time cost involved for sperm banking and a yearly cost for preserving the specimen.  Overall chances of infertility with FOLFOX is low but possible.  Patient and his healthcare power of attorney have discussed their options and have decided not to proceed with fertility conservation at this time    Interval history-tolerating chemotherapy well without any significant nausea or vomiting.  He does report cold sensitivity when he drinks cold liquids or touches cold objects.  He does not have any persistent neuropathy otherwise.  Reports occasional nosebleeds at nights which is self-limited  ECOG PS- 0 Pain scale- 0   Review of systems- Review of Systems  Constitutional:  Negative for chills, fever, malaise/fatigue and weight loss.  HENT:  Positive for nosebleeds. Negative for congestion and ear discharge.   Eyes:  Negative for blurred vision.  Respiratory:  Negative for cough, hemoptysis, sputum production, shortness of breath and wheezing.   Cardiovascular:  Negative for chest pain, palpitations, orthopnea and claudication.  Gastrointestinal:  Negative for abdominal pain, blood in stool, constipation, diarrhea, heartburn, melena, nausea and vomiting.  Genitourinary:  Negative for dysuria, flank pain, frequency, hematuria and urgency.  Musculoskeletal:  Negative for back pain, joint pain and myalgias.  Skin:  Negative for rash.  Neurological:  Positive for sensory change (Peripheral neuropathy). Negative for dizziness, tingling, focal weakness, seizures, weakness and headaches.  Endo/Heme/Allergies:  Does not bruise/bleed easily.  Psychiatric/Behavioral:  Negative for depression and suicidal ideas. The patient does not have insomnia.       Allergies  Allergen Reactions   Geodon [Ziprasidone Hcl] Anaphylaxis     Past Medical History:  Diagnosis Date   Bipolar 1 disorder ([place])    Colon cancer ([place])    PTSD (post-traumatic stress disorder)      Past Surgical History:  Procedure Laterality Date   COLON RESECTION N/A 07/12/2021   Procedure: HAND ASSISTED LAPAROSCOPIC COLON RESECTION;  Surgeon: Jules Husbands, MD;  Location: [facility] ORS;  Service: General;  Laterality: N/A;   COLONOSCOPY WITH PROPOFOL N/A 07/06/2021   Procedure: COLONOSCOPY WITH PROPOFOL;  Surgeon: Lin Landsman, MD;  Location: [facility] ENDOSCOPY;  Service: Gastroenterology;  Laterality: N/A;   COLONOSCOPY WITH PROPOFOL N/A 07/07/2021   Procedure: COLONOSCOPY WITH PROPOFOL;  Surgeon: Lin Landsman, MD;  Location: [facility] ENDOSCOPY;  Service: Gastroenterology;  Laterality: N/A;   ESOPHAGOGASTRODUODENOSCOPY (EGD) WITH PROPOFOL N/A 07/06/2021   Procedure: ESOPHAGOGASTRODUODENOSCOPY (EGD) WITH PROPOFOL;  Surgeon: Lin Landsman, MD;  Location: [facility] ENDOSCOPY;  Service: Gastroenterology;  Laterality: N/A;   GIVENS CAPSULE STUDY N/A 07/06/2021   Procedure: GIVENS CAPSULE STUDY;  Surgeon: Lin Landsman, MD;  Location[facility] ENDOSCOPY;  Service: Gastroenterology;  Laterality: N/A;   PORTACATH PLACEMENT Right 07/22/2021   Procedure: INSERTION PORT-A-CATH;  Surgeon: Jules Husbands, MD;  Location: [facility] ORS;  Service: General;  Laterality: Right;    Social History   Socioeconomic History   Marital status: Single    Spouse name: Not on file   Number of children: Not on file   Years of education: Not on file   Highest education level: Not on file  Occupational History   Not on file  Tobacco Use   Smoking status: Every Day    Packs/day: 0.25    Types: Cigarettes   Smokeless tobacco: Never   Tobacco comments:     5 cig a day  Vaping Use   Vaping Use: Every day   Devices: elfbar  Substance and Sexual Activity   Alcohol use: Not Currently   Drug use: Not Currently   Sexual activity: Yes  Other Topics Concern   Not on file  Social History Narrative   Not on file   Social Determinants of Health   Financial Resource Strain: Not on file  Food Insecurity: Not on file  Transportation Needs: Not on file  Physical Activity: Not on file  Stress: Not on file  Social Connections: Not on file  Intimate Partner Violence: Not on file    Family History  Problem Relation Age of Onset   Kidney cancer Father    Breast cancer Maternal Aunt      Current Outpatient Medications:    ARIPiprazole ER (ABILIFY MAINTENA) 400 MG PRSY prefilled syringe, Inject 400 mg into the muscle every 21 ( twenty-one) days., Disp: , Rfl:    benztropine (COGENTIN) 2 MG tablet, Take 2 mg by mouth at bedtime., Disp: , Rfl:    busPIRone (BUSPAR) 10 MG tablet, Take 10 mg by mouth 2 (two) times daily., Disp: , Rfl:    dexamethasone (DECADRON) 4 MG tablet, Take 2 tablets (8 mg total) by mouth daily. Start the day after chemotherapy for 2 days. Take with food., Disp: 30 tablet, Rfl: 1   FLUoxetine (PROZAC) 20 MG capsule, Take 20 mg by mouth daily., Disp: , Rfl:    ibuprofen (ADVIL) 600 MG tablet, Take 1 tablet (600  mg total) by mouth every 6 (six) hours as needed., Disp: 30 tablet, Rfl: 0   lidocaine-prilocaine (EMLA) cream, Apply to affected area once, Disp: 30 g, Rfl: 3   polyethylene glycol (MIRALAX / GLYCOLAX) 17 g packet, Take 17 g by mouth daily as needed (constipation)., Disp: 14 each, Rfl: 0   prazosin (MINIPRESS) 2 MG capsule, Take 2 mg by mouth 2 (two) times daily., Disp: , Rfl:    prochlorperazine (COMPAZINE) 10 MG tablet, Take 1 tablet (10 mg total) by mouth every 6 (six) hours as needed (Nausea or vomiting)., Disp: 30 tablet, Rfl: 1   ondansetron (ZOFRAN) 8 MG tablet, Take 1 tablet (8 mg total) by mouth 2 (two) times  daily as needed for refractory nausea / vomiting. Start on day 3 after chemotherapy. (Patient not taking: Reported on 09/28/2021), Disp: 30 tablet, Rfl: 1   oxyCODONE (OXY IR/ROXICODONE) 5 MG immediate release tablet, Take 1 tablet (5 mg total) by mouth every 6 (six) hours as needed for severe pain or breakthrough pain. (Patient not taking: Reported on 08/17/2021), Disp: 25 tablet, Rfl: 0 No current facility-administered medications for this visit.  Facility-Administered Medications Ordered in Other Visits:    fluorouracil (ADRUCIL) 4,700 mg in sodium chloride 0.9 % 56 mL chemo infusion, 2,400 mg/m2 (Treatment Plan Recorded), Intravenous, 1 day or 1 dose, Sindy Guadeloupe, MD   fluorouracil (ADRUCIL) chemo injection 800 mg, 400 mg/m2 (Treatment Plan Recorded), Intravenous, Once, Sindy Guadeloupe, MD   sodium chloride flush (NS) 0.9 % injection 10 mL, 10 mL, Intravenous, PRN, Sindy Guadeloupe, MD, 10 mL at 09/28/21 0758  Physical exam:  Vitals:   09/28/21 0827  BP: 112/80  Pulse: 86  Resp: 18  Temp: 98.2 F (36.8 C)  SpO2: 99%  Weight: 169 lb (76.7 kg)   Physical Exam Cardiovascular:     Rate and Rhythm: Normal rate and regular rhythm.     Heart sounds: Normal heart sounds.  Pulmonary:     Effort: Pulmonary effort is normal.     Breath sounds: Normal breath sounds.  Abdominal:     General: Bowel sounds are normal.     Palpations: Abdomen is soft.  Skin:    General: Skin is warm and dry.  Neurological:     Mental Status: He is alert and oriented to person, place, and time.         Latest Ref Rng & Units 09/28/2021    7:58 AM  CMP  Glucose 70 - 99 mg/dL 85   BUN 6 - 20 mg/dL 11   Creatinine 0.61 - 1.24 mg/dL 0.98   Sodium 135 - 145 mmol/L 139   Potassium 3.5 - 5.1 mmol/L 4.2   Chloride 98 - 111 mmol/L 105   CO2 22 - 32 mmol/L 28   Calcium 8.9 - 10.3 mg/dL 9.3   Total Protein 6.5 - 8.1 g/dL 6.3   Total Bilirubin 0.3 - 1.2 mg/dL 0.4   Alkaline Phos 38 - 126 U/L 60   AST 15 - 41  U/L 30   ALT 0 - 44 U/L 30       Latest Ref Rng & Units 09/28/2021    7:58 AM  CBC  WBC 4.0 - 10.5 K/uL 4.2   Hemoglobin 13.0 - 17.0 g/dL 12.6   Hematocrit 39.0 - 52.0 % 39.0   Platelets 150 - 400 K/uL 154      Assessment and plan- Patient is a 36 y.o. male with h/o diagnosed stage IIIc  adenocarcinoma of the colon [id number] s/p right hemicolectomy.  He is here for on treatment assessment prior to cycle 5 of adjuvant FOLFOX chemotherapy  Counts okay to proceed with cycle 5 of adjuvant FOLFOX chemotherapy today.  I will see him back in 2 weeks for cycle 6.  Plan is to complete 12 cycles followed by surveillance scans at that time.  Iron deficiency anemia: Improved after IV iron.  Hemoglobin stable around 12.  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  We will continue full dose of oxaliplatin at 85 mg per metered square.  However if neuropathy gets worse I will reduce the dose to 65 mg per metered square   Visit Diagnosis 1. Iron deficiency anemia, unspecified iron deficiency anemia type   2. Encounter for antineoplastic chemotherapy   3. Chemotherapy-induced peripheral neuropathy ([place])      Dr. Randa Evens, MD, MPH [facility] [phone] 09/28/2021 8:34 AM

## 2021-09-28 NOTE — Progress Notes (Signed)
Pt states that since his last tx he has been dealing with nose bleeds; will try to clean up the scab and starts bleeding once again.

## 2021-09-28 NOTE — Patient Instructions (Signed)
[facility] [place]  Discharge Instructions: Thank you for choosing [facility] to provide your oncology and hematology care.  If you have a lab appointment with the Cancer Center, please go directly to the Cancer Center and check in at the registration area.  Wear comfortable clothing and clothing appropriate for easy access to any Portacath or PICC line.   We strive to give you quality time with your provider. You may need to reschedule your appointment if you arrive late (15 or more minutes).  Arriving late affects you and other patients whose appointments are after yours.  Also, if you miss three or more appointments without notifying the office, you may be dismissed from the clinic at the provider's discretion.      For prescription refill requests, have your pharmacy contact our office and allow 72 hours for refills to be completed.    Today you received the following chemotherapy and/or immunotherapy agents: Oxaliplatin, Adrucil      To help prevent nausea and vomiting after your treatment, we encourage you to take your nausea medication as directed.  BELOW ARE SYMPTOMS THAT SHOULD BE REPORTED IMMEDIATELY: *FEVER GREATER THAN 100.4 F (38 C) OR HIGHER *CHILLS OR SWEATING *NAUSEA AND VOMITING THAT IS NOT CONTROLLED WITH YOUR NAUSEA MEDICATION *UNUSUAL SHORTNESS OF BREATH *UNUSUAL BRUISING OR BLEEDING *URINARY PROBLEMS (pain or burning when urinating, or frequent urination) *BOWEL PROBLEMS (unusual diarrhea, constipation, pain near the anus) TENDERNESS IN MOUTH AND THROAT WITH OR WITHOUT PRESENCE OF ULCERS (sore throat, sores in mouth, or a toothache) UNUSUAL RASH, SWELLING OR PAIN  UNUSUAL VAGINAL DISCHARGE OR ITCHING   Items with * indicate a potential emergency and should be followed up as soon as possible or go to the Emergency Department if any problems should occur.  Please show the CHEMOTHERAPY ALERT CARD or IMMUNOTHERAPY ALERT CARD at  check-in to the Emergency Department and triage nurse.  Should you have questions after your visit or need to cancel or reschedule your appointment, please contact [facility]  [phone] and follow the prompts.  Office hours are 8:00 a.m. to 4:30 p.m. Monday - Friday. Please note that voicemails left after 4:00 p.m. may not be returned until the following business day.  We are closed weekends and major holidays. You have access to a nurse at all times for urgent questions. Please call the main number to the clinic [phone] and follow the prompts.  For any non-urgent questions, you may also contact your provider using MyChart. We now offer e-Visits for anyone 18 and older to request care online for non-urgent symptoms. For details visit [email or web address].   Also download the MyChart app! Go to the app store, search "MyChart", open the app, select [facility], and log in with your MyChart username and password.  Masks are optional in the cancer centers. If you would like for your care team to wear a mask while they are taking care of you, please let them know. For doctor visits, patients may have with them one support person who is at least 34 years old. At this time, visitors are not allowed in the infusion area.   

## 2021-09-30 ENCOUNTER — Inpatient Hospital Stay: Payer: Medicaid Other

## 2021-09-30 VITALS — BP 120/80 | HR 92 | Temp 96.6°F | Resp 18

## 2021-09-30 DIAGNOSIS — Z5111 Encounter for antineoplastic chemotherapy: Secondary | ICD-10-CM | POA: Diagnosis not present

## 2021-09-30 DIAGNOSIS — C189 Malignant neoplasm of colon, unspecified: Secondary | ICD-10-CM

## 2021-09-30 MED ORDER — HEPARIN SOD (PORK) LOCK FLUSH 100 UNIT/ML IV SOLN
500.0000 [IU] | Freq: Once | INTRAVENOUS | Status: AC | PRN
  Administered 2021-09-30: 500 [IU]
  Filled 2021-09-30: qty 5

## 2021-09-30 MED ORDER — SODIUM CHLORIDE 0.9% FLUSH
10.0000 mL | INTRAVENOUS | Status: DC | PRN
  Administered 2021-09-30: 10 mL
  Filled 2021-09-30: qty 10

## 2021-10-11 MED FILL — Dexamethasone Sodium Phosphate Inj 100 MG/10ML: INTRAMUSCULAR | Qty: 1 | Status: AC

## 2021-10-12 ENCOUNTER — Inpatient Hospital Stay: Payer: Medicaid Other

## 2021-10-12 ENCOUNTER — Inpatient Hospital Stay (HOSPITAL_BASED_OUTPATIENT_CLINIC_OR_DEPARTMENT_OTHER): Payer: Medicaid Other | Admitting: Oncology

## 2021-10-12 ENCOUNTER — Encounter: Payer: Self-pay | Admitting: Oncology

## 2021-10-12 VITALS — BP 110/69 | HR 96 | Temp 97.7°F | Resp 18 | Wt 169.4 lb

## 2021-10-12 DIAGNOSIS — C189 Malignant neoplasm of colon, unspecified: Secondary | ICD-10-CM

## 2021-10-12 DIAGNOSIS — Z8051 Family history of malignant neoplasm of kidney: Secondary | ICD-10-CM

## 2021-10-12 DIAGNOSIS — D6959 Other secondary thrombocytopenia: Secondary | ICD-10-CM

## 2021-10-12 DIAGNOSIS — T451X5A Adverse effect of antineoplastic and immunosuppressive drugs, initial encounter: Secondary | ICD-10-CM | POA: Diagnosis not present

## 2021-10-12 DIAGNOSIS — Z5111 Encounter for antineoplastic chemotherapy: Secondary | ICD-10-CM | POA: Diagnosis not present

## 2021-10-12 DIAGNOSIS — F17210 Nicotine dependence, cigarettes, uncomplicated: Secondary | ICD-10-CM

## 2021-10-12 DIAGNOSIS — Z803 Family history of malignant neoplasm of breast: Secondary | ICD-10-CM

## 2021-10-12 LAB — CBC WITH DIFFERENTIAL/PLATELET
Abs Immature Granulocytes: 0.01 10*3/uL (ref 0.00–0.07)
Basophils Absolute: 0 10*3/uL (ref 0.0–0.1)
Basophils Relative: 0 %
Eosinophils Absolute: 0 10*3/uL (ref 0.0–0.5)
Eosinophils Relative: 1 %
HCT: 40.8 % (ref 39.0–52.0)
Hemoglobin: 13.2 g/dL (ref 13.0–17.0)
Immature Granulocytes: 0 %
Lymphocytes Relative: 40 %
Lymphs Abs: 1.9 10*3/uL (ref 0.7–4.0)
MCH: 30 pg (ref 26.0–34.0)
MCHC: 32.4 g/dL (ref 30.0–36.0)
MCV: 92.7 fL (ref 80.0–100.0)
Monocytes Absolute: 0.7 10*3/uL (ref 0.1–1.0)
Monocytes Relative: 16 %
Neutro Abs: 2 10*3/uL (ref 1.7–7.7)
Neutrophils Relative %: 43 %
Platelets: 147 10*3/uL — ABNORMAL LOW (ref 150–400)
RBC: 4.4 MIL/uL (ref 4.22–5.81)
RDW: 19.3 % — ABNORMAL HIGH (ref 11.5–15.5)
WBC: 4.7 10*3/uL (ref 4.0–10.5)
nRBC: 0 % (ref 0.0–0.2)

## 2021-10-12 LAB — COMPREHENSIVE METABOLIC PANEL
ALT: 26 U/L (ref 0–44)
AST: 24 U/L (ref 15–41)
Albumin: 4.1 g/dL (ref 3.5–5.0)
Alkaline Phosphatase: 59 U/L (ref 38–126)
Anion gap: 5 (ref 5–15)
BUN: 13 mg/dL (ref 6–20)
CO2: 28 mmol/L (ref 22–32)
Calcium: 9.1 mg/dL (ref 8.9–10.3)
Chloride: 103 mmol/L (ref 98–111)
Creatinine, Ser: 0.96 mg/dL (ref 0.61–1.24)
GFR, Estimated: >60 mL/min (ref >60)
Glucose, Bld: 92 mg/dL (ref 70–99)
Potassium: 3.8 mmol/L (ref 3.5–5.1)
Sodium: 136 mmol/L (ref 135–145)
Total Bilirubin: 0.4 mg/dL (ref 0.3–1.2)
Total Protein: 6.4 g/dL — ABNORMAL LOW (ref 6.5–8.1)

## 2021-10-12 MED ORDER — OXALIPLATIN CHEMO INJECTION 100 MG/20ML
85.0000 mg/m2 | Freq: Once | INTRAVENOUS | Status: AC
  Administered 2021-10-12: 165 mg via INTRAVENOUS
  Filled 2021-10-12: qty 20

## 2021-10-12 MED ORDER — PALONOSETRON HCL INJECTION 0.25 MG/5ML
0.2500 mg | Freq: Once | INTRAVENOUS | Status: AC
  Administered 2021-10-12: 0.25 mg via INTRAVENOUS
  Filled 2021-10-12: qty 5

## 2021-10-12 MED ORDER — SODIUM CHLORIDE 0.9 % IV SOLN
10.0000 mg | Freq: Once | INTRAVENOUS | Status: AC
  Administered 2021-10-12: 10 mg via INTRAVENOUS
  Filled 2021-10-12: qty 10

## 2021-10-12 MED ORDER — DEXTROSE 5 % IV SOLN
Freq: Once | INTRAVENOUS | Status: AC
  Filled 2021-10-12: qty 250

## 2021-10-12 MED ORDER — SODIUM CHLORIDE 0.9 % IV SOLN
2400.0000 mg/m2 | INTRAVENOUS | Status: DC
  Administered 2021-10-12: 4700 mg via INTRAVENOUS
  Filled 2021-10-12: qty 94

## 2021-10-12 MED ORDER — LEUCOVORIN CALCIUM INJECTION 350 MG
800.0000 mg | Freq: Once | INTRAVENOUS | Status: AC
  Administered 2021-10-12: 800 mg via INTRAVENOUS
  Filled 2021-10-12: qty 40

## 2021-10-12 MED ORDER — FLUOROURACIL CHEMO INJECTION 2.5 GM/50ML
400.0000 mg/m2 | Freq: Once | INTRAVENOUS | Status: AC
  Administered 2021-10-12: 800 mg via INTRAVENOUS
  Filled 2021-10-12: qty 16

## 2021-10-12 MED ORDER — HEPARIN SOD (PORK) LOCK FLUSH 100 UNIT/ML IV SOLN
500.0000 [IU] | Freq: Once | INTRAVENOUS | Status: DC
  Filled 2021-10-12: qty 5

## 2021-10-12 MED ORDER — SODIUM CHLORIDE 0.9% FLUSH
10.0000 mL | Freq: Once | INTRAVENOUS | Status: AC
  Administered 2021-10-12: 10 mL via INTRAVENOUS
  Filled 2021-10-12: qty 10

## 2021-10-12 NOTE — Patient Instructions (Signed)
[facility] [place]  Discharge Instructions: Thank you for choosing [facility] to provide your oncology and hematology care.  If you have a lab appointment with the [facility], please go directly to the [facility] and check in at the registration area.  Wear comfortable clothing and clothing appropriate for easy access to any Portacath or PICC line.   We strive to give you quality time with your provider. You may need to reschedule your appointment if you arrive late (15 or more minutes).  Arriving late affects you and other patients whose appointments are after yours.  Also, if you miss three or more appointments without notifying the office, you may be dismissed from the clinic at the provider's discretion.      For prescription refill requests, have your pharmacy contact our office and allow 72 hours for refills to be completed.    Today you received the following chemotherapy and/or immunotherapy agents: Oxaliplatin, Adrucil      To help prevent nausea and vomiting after your treatment, we encourage you to take your nausea medication as directed.  BELOW ARE SYMPTOMS THAT SHOULD BE REPORTED IMMEDIATELY: *FEVER GREATER THAN 100.4 F (38 C) OR HIGHER *CHILLS OR SWEATING *NAUSEA AND VOMITING THAT IS NOT CONTROLLED WITH YOUR NAUSEA MEDICATION *UNUSUAL SHORTNESS OF BREATH *UNUSUAL BRUISING OR BLEEDING *URINARY PROBLEMS (pain or burning when urinating, or frequent urination) *BOWEL PROBLEMS (unusual diarrhea, constipation, pain near the anus) TENDERNESS IN MOUTH AND THROAT WITH OR WITHOUT PRESENCE OF ULCERS (sore throat, sores in mouth, or a toothache) UNUSUAL RASH, SWELLING OR PAIN  UNUSUAL VAGINAL DISCHARGE OR ITCHING   Items with * indicate a potential emergency and should be followed up as soon as possible or go to the Emergency Department if any problems should occur.  Please show the CHEMOTHERAPY ALERT CARD or IMMUNOTHERAPY ALERT CARD at  check-in to the Emergency Department and triage nurse.  Should you have questions after your visit or need to cancel or reschedule your appointment, please contact [facility] [place] [facility] [place]  [phone] and follow the prompts.  Office hours are 8:00 a.m. to 4:30 p.m. Monday - Friday. Please note that voicemails left after 4:00 p.m. may not be returned until the following business day.  We are closed weekends and major holidays. You have access to a nurse at all times for urgent questions. Please call the main number to the clinic [phone] and follow the prompts.  For any non-urgent questions, you may also contact your provider using MyChart. We now offer e-Visits for anyone 56 and older to request care online for non-urgent symptoms. For details visit [email or web address].   Also download the MyChart app! Go to the app store, search "MyChart", open the app, select [facility], and log in with your MyChart username and password.  Masks are optional in the cancer centers. If you would like for your care team to wear a mask while they are taking care of you, please let them know. For doctor visits, patients may have with them one support person who is at least 34 years old. At this time, visitors are not allowed in the infusion area.

## 2021-10-12 NOTE — Progress Notes (Signed)
Pt states that since his last visit he has still been dealing with epistaxis that scab up on his left nostril as soon as he stops bleeding; states it is hard to take care of; will clean it up but still occuring.

## 2021-10-12 NOTE — Progress Notes (Signed)
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  He was found to have a hemoglobin of 4.5/18.7 on admission.  He received blood transfusion as well as IV iron and his hemoglobin is presently up to an 8.  He underwent colonoscopy which showed malignant partially obstructing tumor that was circumferential 80 cm proximal to the anus.  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  CT chest abdomen and pelvis with contrast did not show any evidence of distant metastatic disease.  2 small sclerotic densities in the acetabulum and proximal right femur likely benign process.  Pathologically enlarged lymph nodes superior to circumferential colonic lesion suggesting metastatic adenopathy.   Patient underwent right hemicolectomy with Dr. Dahlia Byes on 07/12/2021.  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  Vermiform appendix negative for tumor.  Radial margin positive for tumor (2 lymph nodes at  margin with adenocarcinoma).  Proximal and distal margins negative.  7 out of 56 lymph nodes positive for metastatic adenocarcinoma.  Tumor deposits not applicable.  No lymphovascular or perineural invasion seen.  PT4APN2B   Fertility conservation was also discussed an option of sperm banking at [facility] was discussed with the patient and his healthcare power of attorney [name].  There would be a one-time cost involved for sperm banking and a yearly cost for preserving the specimen.  Overall chances of infertility with FOLFOX is low but possible.  Patient and his healthcare power of attorney have discussed their options and have decided not to proceed with fertility conservation at this time   Plan is for 12 cycles of adjuvant FOLFOX followed by consideration for radiation given T4 lesion  Interval history-reports having tingling numbness in his hands why he gets chemotherapy but he has not had any persistent neuropathy thereafter.  Denies any neuropathy in his feet.  Otherwise tolerating chemotherapy well without any significant side effects.  ECOG PS- 0 Pain scale- 0  Review of systems- Review of Systems  Constitutional:  Negative for chills, fever, malaise/fatigue and weight loss.  HENT:  Negative for congestion, ear discharge and nosebleeds.   Eyes:  Negative for blurred vision.  Respiratory:  Negative for cough, hemoptysis, sputum production, shortness of breath and wheezing.   Cardiovascular:  Negative for chest pain, palpitations, orthopnea and claudication.  Gastrointestinal:  Negative for abdominal pain, blood in stool, constipation, diarrhea, heartburn, melena, nausea and vomiting.  Genitourinary:  Negative for dysuria, flank pain, frequency, hematuria and urgency.  Musculoskeletal:  Negative for back pain, joint pain and myalgias.  Skin:  Negative for rash.  Neurological:  Negative for dizziness, tingling, focal weakness, seizures, weakness and headaches.  Endo/Heme/Allergies:  Does  not bruise/bleed easily.  Psychiatric/Behavioral:  Negative for depression and suicidal ideas. The patient does not have insomnia.       Allergies  Allergen Reactions   Geodon [Ziprasidone Hcl] Anaphylaxis     Past Medical History:  Diagnosis Date   Bipolar 1 disorder ([place])    Colon cancer ([place])    PTSD (post-traumatic stress disorder)      Past Surgical History:  Procedure Laterality Date   COLON RESECTION N/A 07/12/2021   Procedure: HAND ASSISTED LAPAROSCOPIC COLON RESECTION;  Surgeon: Jules Husbands, MD;  Location: [facility] ORS;  Service: General;  Laterality: N/A;   COLONOSCOPY WITH PROPOFOL N/A 07/06/2021   Procedure: COLONOSCOPY WITH PROPOFOL;  Surgeon: Lin Landsman, MD;  Location: [facility] ENDOSCOPY;  Service: Gastroenterology;  Laterality: N/A;   COLONOSCOPY WITH PROPOFOL N/A 07/07/2021   Procedure: COLONOSCOPY WITH PROPOFOL;  Surgeon: Lin Landsman, MD;  Location: [facility] ENDOSCOPY;  Service: Gastroenterology;  Laterality: N/A;   ESOPHAGOGASTRODUODENOSCOPY (EGD) WITH PROPOFOL N/A 07/06/2021   Procedure: ESOPHAGOGASTRODUODENOSCOPY (EGD) WITH PROPOFOL;  Surgeon: Lin Landsman, MD;  Location[facility]COPY;  Service: Gastroenterology;  Laterality: N/A;   GIVENS CAPSULE STUDY N/A 07/06/2021   Procedure: GIVENS CAPSULE STUDY;  Surgeon: Lin Landsman, MD;  Location: [facility] ENDOSCOPY;  Service: Gastroenterology;  Laterality: N/A;   PORTACATH PLACEMENT Right 07/22/2021   Procedure: INSERTION PORT-A-CATH;  Surgeon: Jules Husbands, MD;  Location: [facility] ORS;  Service: General;  Laterality: Right;    Social History   Socioeconomic History   Marital status: Single    Spouse name: Not on file   Number of children: Not on file   Years of education: Not on file   Highest education level: Not on file  Occupational History   Not on file  Tobacco Use   Smoking status: Every Day    Packs/day: 0.25    Types: Cigarettes   Smokeless tobacco: Never   Tobacco comments:    5  cig a day  Vaping Use   Vaping Use: Every day   Devices: elfbar  Substance and Sexual Activity   Alcohol use: Not Currently   Drug use: Not Currently   Sexual activity: Yes  Other Topics Concern   Not on file  Social History Narrative   Not on file   Social Determinants of Health   Financial Resource Strain: Not on file  Food Insecurity: Not on file  Transportation Needs: Not on file  Physical Activity: Not on file  Stress: Not on file  Social Connections: Not on file  Intimate Partner Violence: Not on file    Family History  Problem Relation Age of Onset   Kidney cancer Father    Breast cancer Maternal Aunt      Current Outpatient Medications:    ARIPiprazole ER (ABILIFY MAINTENA) 400 MG PRSY prefilled syringe, Inject 400 mg into the muscle every 21 ( twenty-one) days., Disp: , Rfl:    benztropine (COGENTIN) 2 MG tablet, Take 2 mg by mouth at bedtime., Disp: , Rfl:    busPIRone (BUSPAR) 10 MG tablet, Take 10 mg by mouth 2 (two) times daily., Disp: , Rfl:    dexamethasone (DECADRON) 4 MG tablet, Take 2 tablets (8 mg total) by mouth daily. Start the day after chemotherapy for 2 days. Take with food., Disp: 30 tablet, Rfl: 1   FLUoxetine (PROZAC) 20 MG capsule, Take 20 mg by mouth daily., Disp: , Rfl:    ibuprofen (ADVIL) 600 MG tablet, Take  1 tablet (600 mg total) by mouth every 6 (six) hours as needed., Disp: 30 tablet, Rfl: 0   lidocaine-prilocaine (EMLA) cream, Apply to affected area once, Disp: 30 g, Rfl: 3   ondansetron (ZOFRAN) 8 MG tablet, Take 1 tablet (8 mg total) by mouth 2 (two) times daily as needed for refractory nausea / vomiting. Start on day 3 after chemotherapy. (Patient not taking: Reported on 09/28/2021), Disp: 30 tablet, Rfl: 1   oxyCODONE (OXY IR/ROXICODONE) 5 MG immediate release tablet, Take 1 tablet (5 mg total) by mouth every 6 (six) hours as needed for severe pain or breakthrough pain. (Patient not taking: Reported on 08/17/2021), Disp: 25 tablet, Rfl:  0   polyethylene glycol (MIRALAX / GLYCOLAX) 17 g packet, Take 17 g by mouth daily as needed (constipation)., Disp: 14 each, Rfl: 0   prazosin (MINIPRESS) 2 MG capsule, Take 2 mg by mouth 2 (two) times daily., Disp: , Rfl:    prochlorperazine (COMPAZINE) 10 MG tablet, Take 1 tablet (10 mg total) by mouth every 6 (six) hours as needed (Nausea or vomiting)., Disp: 30 tablet, Rfl: 1 No current facility-administered medications for this visit.  Facility-Administered Medications Ordered in Other Visits:    heparin lock flush 100 unit/mL, 500 Units, Intravenous, Once, Sindy Guadeloupe, MD  Physical exam:  Vitals:   10/12/21 0840  BP: 110/69  Pulse: 96  Resp: 18  Temp: 97.7 F (36.5 C)  SpO2: 96%  Weight: 169 lb 6.4 oz (76.8 kg)   Physical Exam HENT:     Head: Normocephalic and atraumatic.  Eyes:     Pupils: Pupils are equal, round, and reactive to light.  Cardiovascular:     Rate and Rhythm: Normal rate and regular rhythm.     Heart sounds: Normal heart sounds.  Pulmonary:     Effort: Pulmonary effort is normal.     Breath sounds: Normal breath sounds.  Abdominal:     General: Bowel sounds are normal.     Palpations: Abdomen is soft.  Musculoskeletal:     Cervical back: Normal range of motion.  Skin:    General: Skin is warm and dry.  Neurological:     Mental Status: He is alert and oriented to person, place, and time.         Latest Ref Rng & Units 10/12/2021    8:21 AM  CMP  Glucose 70 - 99 mg/dL 92   BUN 6 - 20 mg/dL 13   Creatinine 0.61 - 1.24 mg/dL 0.96   Sodium 135 - 145 mmol/L 136   Potassium 3.5 - 5.1 mmol/L 3.8   Chloride 98 - 111 mmol/L 103   CO2 22 - 32 mmol/L 28   Calcium 8.9 - 10.3 mg/dL 9.1   Total Protein 6.5 - 8.1 g/dL 6.4   Total Bilirubin 0.3 - 1.2 mg/dL 0.4   Alkaline Phos 38 - 126 U/L 59   AST 15 - 41 U/L 24   ALT 0 - 44 U/L 26       Latest Ref Rng & Units 10/12/2021    8:21 AM  CBC  WBC 4.0 - 10.5 K/uL 4.7   Hemoglobin 13.0 - 17.0 g/dL 13.2    Hematocrit 39.0 - 52.0 % 40.8   Platelets 150 - 400 K/uL 147      Assessment and plan- Patient is a 36 y.o. male  with h/o diagnosed stage IIIc adenocarcinoma of the colon [id number] s/p right hemicolectomy.  He is here for on treatment  assessment prior to cycle 6 of adjuvant FOLFOX chemotherapy  Counseled to proceed with cycle 6 of FOLFOX chemotherapy today and I will see him back in 2 weeks for cycle 7.  He is receiving oxaliplatin presently at 85 mg per metered square.  He has not had any symptoms of neuropathy outside of chemotherapy.  I have recommended that he could try cryotherapy while receiving chemotherapy that can also be preventative as well.  Patient will look into it.  Iron deficiency anemia: Improved and now hemoglobin is 13.2.  Continue to monitor  Chemo-induced thrombocytopenia: Mild continue to monitor.  Likely secondary to oxaliplatin   Visit Diagnosis 1. Encounter for antineoplastic chemotherapy   2. Adenocarcinoma of colon ([place])   3. Chemotherapy-induced thrombocytopenia      Dr. Randa Evens, MD, MPH [facility] at [facility] [phone] 10/12/2021 8:29 AM

## 2021-10-14 ENCOUNTER — Inpatient Hospital Stay: Payer: Medicaid Other

## 2021-10-14 VITALS — BP 123/75 | HR 81 | Temp 96.0°F | Resp 16

## 2021-10-14 DIAGNOSIS — Z5111 Encounter for antineoplastic chemotherapy: Secondary | ICD-10-CM | POA: Diagnosis not present

## 2021-10-14 DIAGNOSIS — C189 Malignant neoplasm of colon, unspecified: Secondary | ICD-10-CM

## 2021-10-14 MED ORDER — SODIUM CHLORIDE 0.9% FLUSH
10.0000 mL | INTRAVENOUS | Status: DC | PRN
  Administered 2021-10-14: 10 mL
  Filled 2021-10-14: qty 10

## 2021-10-14 MED ORDER — HEPARIN SOD (PORK) LOCK FLUSH 100 UNIT/ML IV SOLN
500.0000 [IU] | Freq: Once | INTRAVENOUS | Status: AC | PRN
  Administered 2021-10-14: 500 [IU]
  Filled 2021-10-14: qty 5

## 2021-10-25 MED FILL — Dexamethasone Sodium Phosphate Inj 100 MG/10ML: INTRAMUSCULAR | Qty: 1 | Status: AC

## 2021-10-26 ENCOUNTER — Inpatient Hospital Stay: Payer: Medicaid Other

## 2021-10-26 ENCOUNTER — Inpatient Hospital Stay (HOSPITAL_BASED_OUTPATIENT_CLINIC_OR_DEPARTMENT_OTHER): Payer: Medicaid Other | Admitting: Oncology

## 2021-10-26 ENCOUNTER — Encounter: Payer: Self-pay | Admitting: Oncology

## 2021-10-26 ENCOUNTER — Inpatient Hospital Stay: Payer: Medicaid Other | Attending: Oncology

## 2021-10-26 VITALS — BP 106/71 | HR 87 | Temp 97.2°F | Resp 16 | Wt 172.9 lb

## 2021-10-26 DIAGNOSIS — R7989 Other specified abnormal findings of blood chemistry: Secondary | ICD-10-CM | POA: Insufficient documentation

## 2021-10-26 DIAGNOSIS — Z8051 Family history of malignant neoplasm of kidney: Secondary | ICD-10-CM | POA: Diagnosis not present

## 2021-10-26 DIAGNOSIS — C189 Malignant neoplasm of colon, unspecified: Secondary | ICD-10-CM

## 2021-10-26 DIAGNOSIS — D6959 Other secondary thrombocytopenia: Secondary | ICD-10-CM | POA: Diagnosis not present

## 2021-10-26 DIAGNOSIS — Z5111 Encounter for antineoplastic chemotherapy: Secondary | ICD-10-CM | POA: Insufficient documentation

## 2021-10-26 DIAGNOSIS — C778 Secondary and unspecified malignant neoplasm of lymph nodes of multiple regions: Secondary | ICD-10-CM | POA: Insufficient documentation

## 2021-10-26 DIAGNOSIS — D509 Iron deficiency anemia, unspecified: Secondary | ICD-10-CM | POA: Insufficient documentation

## 2021-10-26 DIAGNOSIS — F17210 Nicotine dependence, cigarettes, uncomplicated: Secondary | ICD-10-CM | POA: Insufficient documentation

## 2021-10-26 DIAGNOSIS — Z803 Family history of malignant neoplasm of breast: Secondary | ICD-10-CM | POA: Diagnosis not present

## 2021-10-26 LAB — CBC WITH DIFFERENTIAL/PLATELET
Abs Immature Granulocytes: 0.01 10*3/uL (ref 0.00–0.07)
Basophils Absolute: 0 10*3/uL (ref 0.0–0.1)
Basophils Relative: 1 %
Eosinophils Absolute: 0 10*3/uL (ref 0.0–0.5)
Eosinophils Relative: 1 %
HCT: 40.8 % (ref 39.0–52.0)
Hemoglobin: 13.5 g/dL (ref 13.0–17.0)
Immature Granulocytes: 0 %
Lymphocytes Relative: 37 %
Lymphs Abs: 1.5 10*3/uL (ref 0.7–4.0)
MCH: 31 pg (ref 26.0–34.0)
MCHC: 33.1 g/dL (ref 30.0–36.0)
MCV: 93.8 fL (ref 80.0–100.0)
Monocytes Absolute: 0.6 10*3/uL (ref 0.1–1.0)
Monocytes Relative: 15 %
Neutro Abs: 1.8 10*3/uL (ref 1.7–7.7)
Neutrophils Relative %: 46 %
Platelets: 122 10*3/uL — ABNORMAL LOW (ref 150–400)
RBC: 4.35 MIL/uL (ref 4.22–5.81)
RDW: 18.6 % — ABNORMAL HIGH (ref 11.5–15.5)
WBC: 4 10*3/uL (ref 4.0–10.5)
nRBC: 0 % (ref 0.0–0.2)

## 2021-10-26 LAB — COMPREHENSIVE METABOLIC PANEL
ALT: 54 U/L — ABNORMAL HIGH (ref 0–44)
AST: 41 U/L (ref 15–41)
Albumin: 4 g/dL (ref 3.5–5.0)
Alkaline Phosphatase: 61 U/L (ref 38–126)
Anion gap: 7 (ref 5–15)
BUN: 11 mg/dL (ref 6–20)
CO2: 29 mmol/L (ref 22–32)
Calcium: 9.5 mg/dL (ref 8.9–10.3)
Chloride: 104 mmol/L (ref 98–111)
Creatinine, Ser: 0.94 mg/dL (ref 0.61–1.24)
GFR, Estimated: >60 mL/min (ref >60)
Glucose, Bld: 52 mg/dL — ABNORMAL LOW (ref 70–99)
Potassium: 4.2 mmol/L (ref 3.5–5.1)
Sodium: 140 mmol/L (ref 135–145)
Total Bilirubin: 0.6 mg/dL (ref 0.3–1.2)
Total Protein: 6.7 g/dL (ref 6.5–8.1)

## 2021-10-26 MED ORDER — SODIUM CHLORIDE 0.9 % IV SOLN
10.0000 mg | Freq: Once | INTRAVENOUS | Status: AC
  Administered 2021-10-26: 10 mg via INTRAVENOUS
  Filled 2021-10-26: qty 10

## 2021-10-26 MED ORDER — FLUOROURACIL CHEMO INJECTION 2.5 GM/50ML
400.0000 mg/m2 | Freq: Once | INTRAVENOUS | Status: AC
  Administered 2021-10-26: 800 mg via INTRAVENOUS
  Filled 2021-10-26: qty 16

## 2021-10-26 MED ORDER — DEXTROSE 5 % IV SOLN
Freq: Once | INTRAVENOUS | Status: AC
  Filled 2021-10-26: qty 250

## 2021-10-26 MED ORDER — OXALIPLATIN CHEMO INJECTION 100 MG/20ML
85.0000 mg/m2 | Freq: Once | INTRAVENOUS | Status: AC
  Administered 2021-10-26: 165 mg via INTRAVENOUS
  Filled 2021-10-26: qty 10

## 2021-10-26 MED ORDER — SODIUM CHLORIDE 0.9 % IV SOLN
2400.0000 mg/m2 | INTRAVENOUS | Status: DC
  Administered 2021-10-26: 4700 mg via INTRAVENOUS
  Filled 2021-10-26: qty 94

## 2021-10-26 MED ORDER — SODIUM CHLORIDE 0.9% FLUSH
10.0000 mL | Freq: Once | INTRAVENOUS | Status: AC
  Administered 2021-10-26: 10 mL via INTRAVENOUS
  Filled 2021-10-26: qty 10

## 2021-10-26 MED ORDER — PALONOSETRON HCL INJECTION 0.25 MG/5ML
0.2500 mg | Freq: Once | INTRAVENOUS | Status: AC
  Administered 2021-10-26: 0.25 mg via INTRAVENOUS
  Filled 2021-10-26: qty 5

## 2021-10-26 MED ORDER — LEUCOVORIN CALCIUM INJECTION 350 MG
800.0000 mg | Freq: Once | INTRAVENOUS | Status: AC
  Administered 2021-10-26: 800 mg via INTRAVENOUS
  Filled 2021-10-26: qty 40

## 2021-10-26 NOTE — Progress Notes (Signed)
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  He was found to have a hemoglobin of 4.5/18.7 on admission.  He received blood transfusion as well as IV iron and his hemoglobin is presently up to an 8.  He underwent colonoscopy which showed malignant partially obstructing tumor that was circumferential 80 cm proximal to the anus.  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  CT chest abdomen and pelvis with contrast did not show any evidence of distant metastatic disease.  2 small sclerotic densities in the acetabulum and proximal right femur likely benign process.  Pathologically enlarged lymph nodes superior to circumferential colonic lesion suggesting metastatic adenopathy.   Patient underwent right hemicolectomy with Dr. Dahlia Byes on 07/12/2021.  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  Vermiform appendix negative for tumor.  Radial margin positive for tumor (2 lymph nodes at  margin with adenocarcinoma).  Proximal and distal margins negative.  7 out of 56 lymph nodes positive for metastatic adenocarcinoma.  Tumor deposits not applicable.  No lymphovascular or perineural invasion seen.  PT4APN2B   Fertility conservation was also discussed an option of sperm banking at [facility] was discussed with the patient and his healthcare power of attorney [name].  There would be a one-time cost involved for sperm banking and a yearly cost for preserving the specimen.  Overall chances of infertility with FOLFOX is low but possible.  Patient and his healthcare power of attorney have discussed their options and have decided not to proceed with fertility conservation at this time   Plan is for 12 cycles of adjuvant FOLFOX followed by consideration for radiation given T4 lesion    Interval history-tolerating chemotherapy well and denies any significant side effects at this time.  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  ECOG PS- 0 Pain scale- 0   Review of systems- Review of Systems  Constitutional:  Negative for chills, fever, malaise/fatigue and weight loss.  HENT:  Negative for congestion, ear discharge and nosebleeds.   Eyes:  Negative for blurred vision.  Respiratory:  Negative for cough, hemoptysis, sputum production, shortness of breath and wheezing.   Cardiovascular:  Negative for chest pain, palpitations, orthopnea and claudication.  Gastrointestinal:  Negative for abdominal pain, blood in stool, constipation, diarrhea, heartburn, melena, nausea and vomiting.  Genitourinary:  Negative for dysuria, flank pain, frequency, hematuria and urgency.  Musculoskeletal:  Negative for back pain, joint pain and myalgias.  Skin:  Negative for rash.  Neurological:  Negative for dizziness, tingling, focal weakness, seizures, weakness and headaches.  Endo/Heme/Allergies:  Does not bruise/bleed easily.  Psychiatric/Behavioral:  Negative for depression and  suicidal ideas. The patient does not have insomnia.       Allergies  Allergen Reactions   Geodon [Ziprasidone Hcl] Anaphylaxis     Past Medical History:  Diagnosis Date   Bipolar 1 disorder ([place])    Colon cancer ([place])    PTSD (post-traumatic stress disorder)      Past Surgical History:  Procedure Laterality Date   COLON RESECTION N/A 07/12/2021   Procedure: HAND ASSISTED LAPAROSCOPIC COLON RESECTION;  Surgeon: Jules Husbands, MD;  Location: [facility] ORS;  Service: General;  Laterality: N/A;   COLONOSCOPY WITH PROPOFOL N/A 07/06/2021   Procedure: COLONOSCOPY WITH PROPOFOL;  Surgeon: Lin Landsman, MD;  Location: [facility] ENDOSCOPY;  Service: Gastroenterology;  Laterality: N/A;   COLONOSCOPY WITH PROPOFOL N/A 07/07/2021   Procedure: COLONOSCOPY WITH PROPOFOL;  Surgeon: Lin Landsman, MD;  Location: [facility] ENDOSCOPY;  Service: Gastroenterology;  Laterality: N/A;   ESOPHAGOGASTRODUODENOSCOPY (EGD) WITH PROPOFOL N/A 07/06/2021   Procedure: ESOPHAGOGASTRODUODENOSCOPY (EGD) WITH PROPOFOL;  Surgeon: Lin Landsman, MD;  Location: [facility] ENDOSCOPY;  Service: Gastroenterology;  Laterality: N/A;   GIVENS CAPSULE STUDY N/A 07/06/2021   Procedure: GIVENS CAPSULE STUDY;  Surgeon: Lin Landsman, MD;  Location: [facility] ENDOSCOPY;  Service: Gastroenterology;  Laterality: N/A;   PORTACATH PLACEMENT Right 07/22/2021   Procedure: INSERTION PORT-A-CATH;  Surgeon: Jules Husbands, MD;  Location: [facility] ORS;  Service: General;  Laterality: Right;    Social History   Socioeconomic History   Marital status: Single    Spouse name: Not on file   Number of children: Not on file   Years of education: Not on file   Highest education level: Not on file  Occupational History   Not on file  Tobacco Use   Smoking status: Every Day    Packs/day: 0.25    Types: Cigarettes   Smokeless tobacco: Never   Tobacco comments:    5 cig a day  Vaping Use   Vaping Use: Every day   Devices: elfbar  Substance and  Sexual Activity   Alcohol use: Not Currently   Drug use: Not Currently   Sexual activity: Yes  Other Topics Concern   Not on file  Social History Narrative   Not on file   Social Determinants of Health   Financial Resource Strain: Not on file  Food Insecurity: Not on file  Transportation Needs: Not on file  Physical Activity: Not on file  Stress: Not on file  Social Connections: Not on file  Intimate Partner Violence: Not on file    Family History  Problem Relation Age of Onset   Kidney cancer Father    Breast cancer Maternal Aunt      Current Outpatient Medications:    ARIPiprazole ER (ABILIFY MAINTENA) 400 MG PRSY prefilled syringe, Inject 400 mg into the muscle every 21 ( twenty-one) days., Disp: , Rfl:    benztropine (COGENTIN) 2 MG tablet, Take 2 mg by mouth at bedtime., Disp: , Rfl:    busPIRone (BUSPAR) 10 MG tablet, Take 10 mg by mouth 2 (two) times daily., Disp: , Rfl:    dexamethasone (DECADRON) 4 MG tablet, Take 2 tablets (8 mg total) by mouth daily. Start the day after chemotherapy for 2 days. Take with food., Disp: 30 tablet, Rfl: 1   FLUoxetine (PROZAC) 20 MG capsule, Take 20 mg by mouth daily., Disp: , Rfl:    ibuprofen (ADVIL) 600 MG tablet, Take 1 tablet (600 mg total) by mouth  every 6 (six) hours as needed., Disp: 30 tablet, Rfl: 0   lidocaine-prilocaine (EMLA) cream, Apply to affected area once, Disp: 30 g, Rfl: 3   polyethylene glycol (MIRALAX / GLYCOLAX) 17 g packet, Take 17 g by mouth daily as needed (constipation)., Disp: 14 each, Rfl: 0   prazosin (MINIPRESS) 2 MG capsule, Take 2 mg by mouth 2 (two) times daily., Disp: , Rfl:    ondansetron (ZOFRAN) 8 MG tablet, Take 1 tablet (8 mg total) by mouth 2 (two) times daily as needed for refractory nausea / vomiting. Start on day 3 after chemotherapy. (Patient not taking: Reported on 09/28/2021), Disp: 30 tablet, Rfl: 1   oxyCODONE (OXY IR/ROXICODONE) 5 MG immediate release tablet, Take 1 tablet (5 mg total) by  mouth every 6 (six) hours as needed for severe pain or breakthrough pain. (Patient not taking: Reported on 08/17/2021), Disp: 25 tablet, Rfl: 0   prochlorperazine (COMPAZINE) 10 MG tablet, Take 1 tablet (10 mg total) by mouth every 6 (six) hours as needed (Nausea or vomiting). (Patient not taking: Reported on 10/12/2021), Disp: 30 tablet, Rfl: 1 No current facility-administered medications for this visit.  Facility-Administered Medications Ordered in Other Visits:    fluorouracil (ADRUCIL) 4,700 mg in sodium chloride 0.9 % 56 mL chemo infusion, 2,400 mg/m2 (Treatment Plan Recorded), Intravenous, 1 day or 1 dose, Sindy Guadeloupe, MD   fluorouracil (ADRUCIL) chemo injection 800 mg, 400 mg/m2 (Treatment Plan Recorded), Intravenous, Once, Sindy Guadeloupe, MD  Physical exam:  Vitals:   10/26/21 0829  BP: 106/71  Pulse: 87  Resp: 16  Temp: (!) 97.2 F (36.2 C)  SpO2: 98%  Weight: 172 lb 14.4 oz (78.4 kg)   Physical Exam Constitutional:      General: He is not in acute distress. Cardiovascular:     Rate and Rhythm: Normal rate and regular rhythm.     Heart sounds: Normal heart sounds.  Pulmonary:     Effort: Pulmonary effort is normal.  Skin:    General: Skin is warm and dry.  Neurological:     Mental Status: He is alert and oriented to person, place, and time.         Latest Ref Rng & Units 10/26/2021    8:14 AM  CMP  Glucose 70 - 99 mg/dL 52   BUN 6 - 20 mg/dL 11   Creatinine 0.61 - 1.24 mg/dL 0.94   Sodium 135 - 145 mmol/L 140   Potassium 3.5 - 5.1 mmol/L 4.2   Chloride 98 - 111 mmol/L 104   CO2 22 - 32 mmol/L 29   Calcium 8.9 - 10.3 mg/dL 9.5   Total Protein 6.5 - 8.1 g/dL 6.7   Total Bilirubin 0.3 - 1.2 mg/dL 0.6   Alkaline Phos 38 - 126 U/L 61   AST 15 - 41 U/L 41   ALT 0 - 44 U/L 54       Latest Ref Rng & Units 10/26/2021    8:14 AM  CBC  WBC 4.0 - 10.5 K/uL 4.0   Hemoglobin 13.0 - 17.0 g/dL 13.5   Hematocrit 39.0 - 52.0 % 40.8   Platelets 150 - 400 K/uL 122       Assessment and plan- Patient is a 36 y.o. male with h/o diagnosed stage IIIc adenocarcinoma of the colon [id number] s/p right hemicolectomy.  He is here for on treatment assessment prior to cycle 7 of adjuvant FOLFOX chemotherapy  Counts okay to proceed with cycle 7 of FOLFOX  chemotherapy today and pump disconnect on day 3.  I will see him back in 2 weeks for cycle 8.  Plan is to complete 12 cycles followed by consideration for radiation.  Iron deficiency anemia improved and now normalized after receiving IV iron.  Chemo-induced thrombocytopenia: Likely secondary to oxaliplatin.  Continue to monitor   Visit Diagnosis 1. Encounter for antineoplastic chemotherapy   2. Adenocarcinoma of colon [facility])      Dr. Randa Evens, MD, MPH [facility] at [facility] [phone] 10/26/2021 12:07 PM

## 2021-10-26 NOTE — Patient Instructions (Signed)
[facility] [place]  Discharge Instructions: Thank you for choosing [facility] to provide your oncology and hematology care.  If you have a lab appointment with the Cancer Center, please go directly to the Cancer Center and check in at the registration area.  Wear comfortable clothing and clothing appropriate for easy access to any Portacath or PICC line.   We strive to give you quality time with your provider. You may need to reschedule your appointment if you arrive late (15 or more minutes).  Arriving late affects you and other patients whose appointments are after yours.  Also, if you miss three or more appointments without notifying the office, you may be dismissed from the clinic at the provider's discretion.      For prescription refill requests, have your pharmacy contact our office and allow 72 hours for refills to be completed.       To help prevent nausea and vomiting after your treatment, we encourage you to take your nausea medication as directed.  BELOW ARE SYMPTOMS THAT SHOULD BE REPORTED IMMEDIATELY: *FEVER GREATER THAN 100.4 F (38 C) OR HIGHER *CHILLS OR SWEATING *NAUSEA AND VOMITING THAT IS NOT CONTROLLED WITH YOUR NAUSEA MEDICATION *UNUSUAL SHORTNESS OF BREATH *UNUSUAL BRUISING OR BLEEDING *URINARY PROBLEMS (pain or burning when urinating, or frequent urination) *BOWEL PROBLEMS (unusual diarrhea, constipation, pain near the anus) TENDERNESS IN MOUTH AND THROAT WITH OR WITHOUT PRESENCE OF ULCERS (sore throat, sores in mouth, or a toothache) UNUSUAL RASH, SWELLING OR PAIN  UNUSUAL VAGINAL DISCHARGE OR ITCHING   Items with * indicate a potential emergency and should be followed up as soon as possible or go to the Emergency Department if any problems should occur.  Please show the CHEMOTHERAPY ALERT CARD or IMMUNOTHERAPY ALERT CARD at check-in to the Emergency Department and triage nurse.  Should you have questions after your  visit or need to cancel or reschedule your appointment, please contact [facility] [place]  [phone] and follow the prompts.  Office hours are 8:00 a.m. to 4:30 p.m. Monday - Friday. Please note that voicemails left after 4:00 p.m. may not be returned until the following business day.  We are closed weekends and major holidays. You have access to a nurse at all times for urgent questions. Please call the main number to the clinic [phone] and follow the prompts.  For any non-urgent questions, you may also contact your provider using MyChart. We now offer e-Visits for anyone 18 and older to request care online for non-urgent symptoms. For details visit [email or web address].   Also download the MyChart app! Go to the app store, search "MyChart", open the app, select [facility], and log in with your MyChart username and password.  Masks are optional in the cancer centers. If you would like for your care team to wear a mask while they are taking care of you, please let them know. For doctor visits, patients may have with them one support person who is at least 34 years old. At this time, visitors are not allowed in the infusion area.   

## 2021-10-28 ENCOUNTER — Inpatient Hospital Stay: Payer: Medicaid Other

## 2021-10-28 DIAGNOSIS — Z5111 Encounter for antineoplastic chemotherapy: Secondary | ICD-10-CM | POA: Diagnosis not present

## 2021-10-28 DIAGNOSIS — C189 Malignant neoplasm of colon, unspecified: Secondary | ICD-10-CM

## 2021-10-28 MED ORDER — SODIUM CHLORIDE 0.9% FLUSH
10.0000 mL | INTRAVENOUS | Status: DC | PRN
  Administered 2021-10-28: 10 mL
  Filled 2021-10-28: qty 10

## 2021-10-28 MED ORDER — HEPARIN SOD (PORK) LOCK FLUSH 100 UNIT/ML IV SOLN
500.0000 [IU] | Freq: Once | INTRAVENOUS | Status: AC | PRN
  Administered 2021-10-28: 500 [IU]
  Filled 2021-10-28: qty 5

## 2021-10-28 NOTE — Progress Notes (Signed)
Patient presented for Pump DC. Attempted to flush port with NS and met resistance. Port needle appeared to be pulled slightly back and once pushed back in, port flushed well with brisk blood return. Reviewed pump setting and it appears that patient only received 93.2 mls of 5FU with a remainder of 56.8 mls. Patient states that on 10/27/2021, the pump fell from the bed and a high pressure alarm alerted. Dr. Janese Banks made aware. Patient prefers not to finish infusion throughout the weekend. Per Dr. Janese Banks, discard remainder of 5FU.

## 2021-11-07 ENCOUNTER — Other Ambulatory Visit: Payer: Self-pay

## 2021-11-08 MED FILL — Dexamethasone Sodium Phosphate Inj 100 MG/10ML: INTRAMUSCULAR | Qty: 1 | Status: AC

## 2021-11-09 ENCOUNTER — Inpatient Hospital Stay (HOSPITAL_BASED_OUTPATIENT_CLINIC_OR_DEPARTMENT_OTHER): Payer: Medicaid Other | Admitting: Oncology

## 2021-11-09 ENCOUNTER — Inpatient Hospital Stay: Payer: Medicaid Other

## 2021-11-09 ENCOUNTER — Encounter: Payer: Self-pay | Admitting: Oncology

## 2021-11-09 VITALS — BP 102/71 | HR 82 | Temp 98.6°F | Resp 16 | Ht 72.0 in | Wt 174.2 lb

## 2021-11-09 DIAGNOSIS — C189 Malignant neoplasm of colon, unspecified: Secondary | ICD-10-CM

## 2021-11-09 DIAGNOSIS — R7989 Other specified abnormal findings of blood chemistry: Secondary | ICD-10-CM | POA: Diagnosis not present

## 2021-11-09 DIAGNOSIS — Z5111 Encounter for antineoplastic chemotherapy: Secondary | ICD-10-CM

## 2021-11-09 LAB — CBC WITH DIFFERENTIAL/PLATELET
Abs Immature Granulocytes: 0.03 10*3/uL (ref 0.00–0.07)
Basophils Absolute: 0 10*3/uL (ref 0.0–0.1)
Basophils Relative: 0 %
Eosinophils Absolute: 0 10*3/uL (ref 0.0–0.5)
Eosinophils Relative: 1 %
HCT: 42.6 % (ref 39.0–52.0)
Hemoglobin: 14.3 g/dL (ref 13.0–17.0)
Immature Granulocytes: 1 %
Lymphocytes Relative: 22 %
Lymphs Abs: 1.3 10*3/uL (ref 0.7–4.0)
MCH: 31.6 pg (ref 26.0–34.0)
MCHC: 33.6 g/dL (ref 30.0–36.0)
MCV: 94.2 fL (ref 80.0–100.0)
Monocytes Absolute: 0.7 10*3/uL (ref 0.1–1.0)
Monocytes Relative: 11 %
Neutro Abs: 4 10*3/uL (ref 1.7–7.7)
Neutrophils Relative %: 65 %
Platelets: 153 10*3/uL (ref 150–400)
RBC: 4.52 MIL/uL (ref 4.22–5.81)
RDW: 18.4 % — ABNORMAL HIGH (ref 11.5–15.5)
WBC: 6 10*3/uL (ref 4.0–10.5)
nRBC: 0 % (ref 0.0–0.2)

## 2021-11-09 LAB — COMPREHENSIVE METABOLIC PANEL
ALT: 108 U/L — ABNORMAL HIGH (ref 0–44)
AST: 78 U/L — ABNORMAL HIGH (ref 15–41)
Albumin: 4.1 g/dL (ref 3.5–5.0)
Alkaline Phosphatase: 85 U/L (ref 38–126)
Anion gap: 7 (ref 5–15)
BUN: 15 mg/dL (ref 6–20)
CO2: 27 mmol/L (ref 22–32)
Calcium: 9.4 mg/dL (ref 8.9–10.3)
Chloride: 106 mmol/L (ref 98–111)
Creatinine, Ser: 0.83 mg/dL (ref 0.61–1.24)
GFR, Estimated: >60 mL/min (ref >60)
Glucose, Bld: 73 mg/dL (ref 70–99)
Potassium: 4 mmol/L (ref 3.5–5.1)
Sodium: 140 mmol/L (ref 135–145)
Total Bilirubin: 0.3 mg/dL (ref 0.3–1.2)
Total Protein: 7 g/dL (ref 6.5–8.1)

## 2021-11-09 MED ORDER — FLUOROURACIL CHEMO INJECTION 2.5 GM/50ML
400.0000 mg/m2 | Freq: Once | INTRAVENOUS | Status: AC
  Administered 2021-11-09: 800 mg via INTRAVENOUS
  Filled 2021-11-09: qty 16

## 2021-11-09 MED ORDER — SODIUM CHLORIDE 0.9 % IV SOLN
10.0000 mg | Freq: Once | INTRAVENOUS | Status: AC
  Administered 2021-11-09: 10 mg via INTRAVENOUS
  Filled 2021-11-09: qty 10

## 2021-11-09 MED ORDER — PALONOSETRON HCL INJECTION 0.25 MG/5ML
0.2500 mg | Freq: Once | INTRAVENOUS | Status: AC
  Administered 2021-11-09: 0.25 mg via INTRAVENOUS
  Filled 2021-11-09: qty 5

## 2021-11-09 MED ORDER — SODIUM CHLORIDE 0.9 % IV SOLN
2400.0000 mg/m2 | INTRAVENOUS | Status: DC
  Administered 2021-11-09: 4700 mg via INTRAVENOUS
  Filled 2021-11-09: qty 94

## 2021-11-09 MED ORDER — OXALIPLATIN CHEMO INJECTION 100 MG/20ML
85.0000 mg/m2 | Freq: Once | INTRAVENOUS | Status: AC
  Administered 2021-11-09: 165 mg via INTRAVENOUS
  Filled 2021-11-09: qty 33

## 2021-11-09 MED ORDER — DEXTROSE 5 % IV SOLN
Freq: Once | INTRAVENOUS | Status: AC
  Filled 2021-11-09: qty 250

## 2021-11-09 MED ORDER — LEUCOVORIN CALCIUM INJECTION 350 MG
800.0000 mg | Freq: Once | INTRAVENOUS | Status: AC
  Administered 2021-11-09: 800 mg via INTRAVENOUS
  Filled 2021-11-09: qty 40

## 2021-11-09 NOTE — Patient Instructions (Signed)
[facility] AT [place]  Discharge Instructions: Thank you for choosing [facility] to provide your oncology and hematology care.  If you have a lab appointment with the [facility], please go directly to the [facility] and check in at the registration area.  Wear comfortable clothing and clothing appropriate for easy access to any Portacath or PICC line.   We strive to give you quality time with your provider. You may need to reschedule your appointment if you arrive late (15 or more minutes).  Arriving late affects you and other patients whose appointments are after yours.  Also, if you miss three or more appointments without notifying the office, you may be dismissed from the clinic at the provider's discretion.      For prescription refill requests, have your pharmacy contact our office and allow 72 hours for refills to be completed.    Today you received the following chemotherapy and/or immunotherapy agents Oxaliplatin, Leucovorin and Adrucil       To help prevent nausea and vomiting after your treatment, we encourage you to take your nausea medication as directed.  BELOW ARE SYMPTOMS THAT SHOULD BE REPORTED IMMEDIATELY: *FEVER GREATER THAN 100.4 F (38 C) OR HIGHER *CHILLS OR SWEATING *NAUSEA AND VOMITING THAT IS NOT CONTROLLED WITH YOUR NAUSEA MEDICATION *UNUSUAL SHORTNESS OF BREATH *UNUSUAL BRUISING OR BLEEDING *URINARY PROBLEMS (pain or burning when urinating, or frequent urination) *BOWEL PROBLEMS (unusual diarrhea, constipation, pain near the anus) TENDERNESS IN MOUTH AND THROAT WITH OR WITHOUT PRESENCE OF ULCERS (sore throat, sores in mouth, or a toothache) UNUSUAL RASH, SWELLING OR PAIN  UNUSUAL VAGINAL DISCHARGE OR ITCHING   Items with * indicate a potential emergency and should be followed up as soon as possible or go to the Emergency Department if any problems should occur.  Please show the CHEMOTHERAPY ALERT CARD or IMMUNOTHERAPY  ALERT CARD at check-in to the Emergency Department and triage nurse.  Should you have questions after your visit or need to cancel or reschedule your appointment, please contact [facility] [place] AT [place]  [phone] and follow the prompts.  Office hours are 8:00 a.m. to 4:30 p.m. Monday - Friday. Please note that voicemails left after 4:00 p.m. may not be returned until the following business day.  We are closed weekends and major holidays. You have access to a nurse at all times for urgent questions. Please call the main number to the clinic [phone] and follow the prompts.  For any non-urgent questions, you may also contact your provider using MyChart. We now offer e-Visits for anyone 40 and older to request care online for non-urgent symptoms. For details visit [email or web address].   Also download the MyChart app! Go to the app store, search "MyChart", open the app, select [facility], and log in with your MyChart username and password.  Masks are optional in the cancer centers. If you would like for your care team to wear a mask while they are taking care of you, please let them know. For doctor visits, patients may have with them one support person who is at least 34 years old. At this time, visitors are not allowed in the infusion area.

## 2021-11-09 NOTE — Progress Notes (Signed)
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  He was found to have a hemoglobin of 4.5/18.7 on admission.  He received blood transfusion as well as IV iron and his hemoglobin is presently up to an 8.  He underwent colonoscopy which showed malignant partially obstructing tumor that was circumferential 80 cm proximal to the anus.  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  CT chest abdomen and pelvis with contrast did not show any evidence of distant metastatic disease.  2 small sclerotic densities in the acetabulum and proximal right femur likely benign process.  Pathologically enlarged lymph nodes superior to circumferential colonic lesion suggesting metastatic adenopathy.   Patient underwent right hemicolectomy with Dr. Dahlia Byes on 07/12/2021.  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  Vermiform appendix negative for tumor.  Radial margin positive for tumor (2 lymph nodes at  margin with adenocarcinoma).  Proximal and distal margins negative.  7 out of 56 lymph nodes positive for metastatic adenocarcinoma.  Tumor deposits not applicable.  No lymphovascular or perineural invasion seen.  [id number]   Fertility conservation was also discussed an option of sperm banking at [facility] was discussed with the patient and his healthcare power of attorney [name].  There would be a one-time cost involved for sperm banking and a yearly cost for preserving the specimen.  Overall chances of infertility with FOLFOX is low but possible.  Patient and his healthcare power of attorney have discussed their options and have decided not to proceed with fertility conservation at this time   Plan is for 12 cycles of adjuvant FOLFOX followed by consideration for radiation given T4 lesion  Interval history-patient is tolerating chemotherapy well so far.  He has occasional nosebleeds due to an dryness which is overall self-limited.  Patient reports tingling numbness in his legs only when the 5-FU pump is on.  Once he takes his pump of he does not have any symptoms of neuropathy  ECOG PS- 0 Pain scale- 0  Review of systems- Review of Systems  Constitutional:  Positive for malaise/fatigue. Negative for chills, fever and weight loss.  HENT:  Positive for nosebleeds. Negative for congestion and ear discharge.   Eyes:  Negative for blurred vision.  Respiratory:  Negative for cough, hemoptysis, sputum production, shortness of breath and wheezing.   Cardiovascular:  Negative for chest pain, palpitations, orthopnea and claudication.  Gastrointestinal:  Negative for abdominal pain, blood in stool, constipation, diarrhea, heartburn, melena, nausea and vomiting.  Genitourinary:  Negative for dysuria, flank pain, frequency, hematuria and urgency.  Musculoskeletal:  Negative for back pain, joint pain and myalgias.  Skin:  Negative for rash.  Neurological:  Negative for dizziness, tingling, focal weakness,  seizures, weakness and headaches.  Endo/Heme/Allergies:  Does not bruise/bleed easily.  Psychiatric/Behavioral:  Negative for depression and suicidal ideas. The patient does not have insomnia.       Allergies  Allergen Reactions   Geodon [Ziprasidone Hcl] Anaphylaxis     Past Medical History:  Diagnosis Date   Bipolar 1 disorder ([place])    Colon cancer ([place])    PTSD (post-traumatic stress disorder)      Past Surgical History:  Procedure Laterality Date   COLON RESECTION N/A 07/12/2021   Procedure: HAND ASSISTED LAPAROSCOPIC COLON RESECTION;  Surgeon: Jules Husbands, MD;  Location: [facility] ORS;  Service: General;  Laterality: N/A;   COLONOSCOPY WITH PROPOFOL N/A 07/06/2021   Procedure: COLONOSCOPY WITH PROPOFOL;  Surgeon: Lin Landsman, MD;  Location: [facility] ENDOSCOPY;  Service: Gastroenterology;  Laterality: N/A;   COLONOSCOPY WITH PROPOFOL N/A 07/07/2021   Procedure: COLONOSCOPY WITH PROPOFOL;  Surgeon: Lin Landsman, MD;  Location: [facility] ENDOSCOPY;  Service: Gastroenterology;  Laterality: N/A;   ESOPHAGOGASTRODUODENOSCOPY (EGD) WITH PROPOFOL N/A 07/06/2021   Procedure: ESOPHAGOGASTRODUODENOSCOPY (EGD) WITH PROPOFOL;  Surgeon: Lin Landsman, MD;  Location: [facility] ENDOSCOPY;  Service: Gastroenterology;  Laterality: N/A;   GIVENS CAPSULE STUDY N/A 07/06/2021   Procedure: GIVENS CAPSULE STUDY;  Surgeon: Lin Landsman, MD;  Location: [facility] ENDOSCOPY;  Service: Gastroenterology;  Laterality: N/A;   PORTACATH PLACEMENT Right 07/22/2021   Procedure: INSERTION PORT-A-CATH;  Surgeon: Jules Husbands, MD;  Location: [facility] ORS;  Service: General;  Laterality: Right;    Social History   Socioeconomic History   Marital status: Single    Spouse name: Not on file   Number of children: Not on file   Years of education: Not on file   Highest education level: Not on file  Occupational History   Not on file  Tobacco Use   Smoking status: Every Day    Packs/day: 0.25    Types:  Cigarettes   Smokeless tobacco: Never   Tobacco comments:    1 cig a 5 days  Vaping Use   Vaping Use: Every day   Devices: elfbar  Substance and Sexual Activity   Alcohol use: Not Currently   Drug use: Not Currently   Sexual activity: Yes  Other Topics Concern   Not on file  Social History Narrative   Not on file   Social Determinants of Health   Financial Resource Strain: Not on file  Food Insecurity: Not on file  Transportation Needs: Not on file  Physical Activity: Not on file  Stress: Not on file  Social Connections: Not on file  Intimate Partner Violence: Not on file    Family History  Problem Relation Age of Onset   Kidney cancer Father    Breast cancer Maternal Aunt      Current Outpatient Medications:    ARIPiprazole ER (ABILIFY MAINTENA) 400 MG PRSY prefilled syringe, Inject 400 mg into the muscle every 21 ( twenty-one) days., Disp: , Rfl:    benztropine (COGENTIN) 2 MG tablet, Take 2 mg by mouth at bedtime., Disp: , Rfl:    busPIRone (BUSPAR) 10 MG tablet, Take 10 mg by mouth 2 (two) times daily., Disp: , Rfl:    dexamethasone (DECADRON) 4 MG tablet, Take 2 tablets (8 mg total) by mouth daily. Start the day after chemotherapy for 2 days. Take with food., Disp: 30 tablet, Rfl: 1   FLUoxetine (PROZAC) 20 MG capsule,  Take 20 mg by mouth daily., Disp: , Rfl:    ibuprofen (ADVIL) 600 MG tablet, Take 1 tablet (600 mg total) by mouth every 6 (six) hours as needed., Disp: 30 tablet, Rfl: 0   lidocaine-prilocaine (EMLA) cream, Apply to affected area once, Disp: 30 g, Rfl: 3   polyethylene glycol (MIRALAX / GLYCOLAX) 17 g packet, Take 17 g by mouth daily as needed (constipation)., Disp: 14 each, Rfl: 0   prazosin (MINIPRESS) 2 MG capsule, Take 2 mg by mouth 2 (two) times daily., Disp: , Rfl:    ondansetron (ZOFRAN) 8 MG tablet, Take 1 tablet (8 mg total) by mouth 2 (two) times daily as needed for refractory nausea / vomiting. Start on day 3 after chemotherapy. (Patient not  taking: Reported on 09/28/2021), Disp: 30 tablet, Rfl: 1   oxyCODONE (OXY IR/ROXICODONE) 5 MG immediate release tablet, Take 1 tablet (5 mg total) by mouth every 6 (six) hours as needed for severe pain or breakthrough pain. (Patient not taking: Reported on 08/17/2021), Disp: 25 tablet, Rfl: 0   prochlorperazine (COMPAZINE) 10 MG tablet, Take 1 tablet (10 mg total) by mouth every 6 (six) hours as needed (Nausea or vomiting). (Patient not taking: Reported on 10/12/2021), Disp: 30 tablet, Rfl: 1 No current facility-administered medications for this visit.  Facility-Administered Medications Ordered in Other Visits:    fluorouracil (ADRUCIL) 4,700 mg in sodium chloride 0.9 % 56 mL chemo infusion, 2,400 mg/m2 (Treatment Plan Recorded), Intravenous, 1 day or 1 dose, Sindy Guadeloupe, MD   fluorouracil (ADRUCIL) chemo injection 800 mg, 400 mg/m2 (Treatment Plan Recorded), Intravenous, Once, Sindy Guadeloupe, MD   leucovorin 800 mg in dextrose 5 % 250 mL infusion, 800 mg, Intravenous, Once, Sindy Guadeloupe, MD, Last Rate: 145 mL/hr at 11/09/21 1026, 800 mg at 11/09/21 1026   oxaliplatin (ELOXATIN) 165 mg in dextrose 5 % 500 mL chemo infusion, 85 mg/m2 (Treatment Plan Recorded), Intravenous, Once, Sindy Guadeloupe, MD, Last Rate: 267 mL/hr at 11/09/21 1028, 165 mg at 11/09/21 1028  Physical exam:  Vitals:   11/09/21 0847  BP: 102/71  Pulse: 82  Resp: 16  Temp: 98.6 F (37 C)  TempSrc: Oral  Weight: 174 lb 3.2 oz (79 kg)  Height: 6' (1.829 m)   Physical Exam Cardiovascular:     Rate and Rhythm: Normal rate and regular rhythm.     Heart sounds: Normal heart sounds.  Pulmonary:     Effort: Pulmonary effort is normal.     Breath sounds: Normal breath sounds.  Skin:    General: Skin is warm and dry.  Neurological:     Mental Status: He is alert and oriented to person, place, and time.         Latest Ref Rng & Units 11/09/2021    8:08 AM  CMP  Glucose 70 - 99 mg/dL 73   BUN 6 - 20 mg/dL 15    Creatinine 0.61 - 1.24 mg/dL 0.83   Sodium 135 - 145 mmol/L 140   Potassium 3.5 - 5.1 mmol/L 4.0   Chloride 98 - 111 mmol/L 106   CO2 22 - 32 mmol/L 27   Calcium 8.9 - 10.3 mg/dL 9.4   Total Protein 6.5 - 8.1 g/dL 7.0   Total Bilirubin 0.3 - 1.2 mg/dL 0.3   Alkaline Phos 38 - 126 U/L 85   AST 15 - 41 U/L 78   ALT 0 - 44 U/L 108       Latest Ref Rng &  Units 11/09/2021    8:08 AM  CBC  WBC 4.0 - 10.5 K/uL 6.0   Hemoglobin 13.0 - 17.0 g/dL 14.3   Hematocrit 39.0 - 52.0 % 42.6   Platelets 150 - 400 K/uL 153      Assessment and plan- Patient is a 36 y.o. male with h/o diagnosed stage IIIc adenocarcinoma of the colon [id number] s/p right hemicolectomy.  He is here for on treatment assessment prior to cycle 8 of adjuvant FOLFOX chemotherapy  Counts okay to proceed with cycle 8 of adjuvant FOLFOX chemotherapy today.  He will be seen by covering MD or NP in 2 weeks for cycle 9 and I will see him back in 4 weeks for cycle 10.  Plan is to complete 12 cycles with consideration of adjuvant radiation therapy thereafter.  He is receiving oxaliplatin at full dose of 85 mg per metered squared  Abnormal LFTs:AST and ALT mildly abnormal at 7808 respectively.  Possibly secondary to chemotherapy.  Continue to monitor   Visit Diagnosis 1. Encounter for antineoplastic chemotherapy   2. Adenocarcinoma of colon ([place])   3. Abnormal LFTs      Dr. Randa Evens, MD, MPH [facility] [phone] 11/09/2021 12:26 PM

## 2021-11-09 NOTE — Progress Notes (Signed)
Pt hasa spot in nasal cavity that bleeds for about 1 min :30 sec. And then it stops and makes scab in the nose

## 2021-11-09 NOTE — Progress Notes (Signed)
AST 78, ALT 108 per Judeen Hammans RN per Dr. Janese Banks okay to proceed with treatment.

## 2021-11-10 ENCOUNTER — Other Ambulatory Visit: Payer: Self-pay

## 2021-11-11 ENCOUNTER — Inpatient Hospital Stay: Payer: Medicaid Other

## 2021-11-11 VITALS — BP 106/72 | HR 77 | Temp 98.7°F | Resp 18

## 2021-11-11 DIAGNOSIS — Z5111 Encounter for antineoplastic chemotherapy: Secondary | ICD-10-CM | POA: Diagnosis not present

## 2021-11-11 DIAGNOSIS — C189 Malignant neoplasm of colon, unspecified: Secondary | ICD-10-CM

## 2021-11-11 MED ORDER — HEPARIN SOD (PORK) LOCK FLUSH 100 UNIT/ML IV SOLN
500.0000 [IU] | Freq: Once | INTRAVENOUS | Status: AC | PRN
  Administered 2021-11-11: 500 [IU]
  Filled 2021-11-11: qty 5

## 2021-11-11 MED ORDER — SODIUM CHLORIDE 0.9% FLUSH
10.0000 mL | INTRAVENOUS | Status: DC | PRN
  Administered 2021-11-11: 10 mL
  Filled 2021-11-11: qty 10

## 2021-11-15 ENCOUNTER — Other Ambulatory Visit: Payer: Self-pay

## 2021-11-22 MED FILL — Dexamethasone Sodium Phosphate Inj 100 MG/10ML: INTRAMUSCULAR | Qty: 1 | Status: AC

## 2021-11-23 ENCOUNTER — Inpatient Hospital Stay (HOSPITAL_BASED_OUTPATIENT_CLINIC_OR_DEPARTMENT_OTHER): Payer: Medicaid Other | Admitting: Medical Oncology

## 2021-11-23 ENCOUNTER — Inpatient Hospital Stay: Payer: Medicaid Other | Attending: Oncology

## 2021-11-23 ENCOUNTER — Encounter: Payer: Self-pay | Admitting: Medical Oncology

## 2021-11-23 ENCOUNTER — Inpatient Hospital Stay: Payer: Medicaid Other

## 2021-11-23 VITALS — BP 123/73 | HR 92 | Temp 97.3°F | Resp 16 | Wt 177.0 lb

## 2021-11-23 DIAGNOSIS — R7989 Other specified abnormal findings of blood chemistry: Secondary | ICD-10-CM

## 2021-11-23 DIAGNOSIS — Z5111 Encounter for antineoplastic chemotherapy: Secondary | ICD-10-CM | POA: Insufficient documentation

## 2021-11-23 DIAGNOSIS — Z8051 Family history of malignant neoplasm of kidney: Secondary | ICD-10-CM | POA: Diagnosis not present

## 2021-11-23 DIAGNOSIS — F17210 Nicotine dependence, cigarettes, uncomplicated: Secondary | ICD-10-CM | POA: Insufficient documentation

## 2021-11-23 DIAGNOSIS — R040 Epistaxis: Secondary | ICD-10-CM | POA: Diagnosis not present

## 2021-11-23 DIAGNOSIS — Z803 Family history of malignant neoplasm of breast: Secondary | ICD-10-CM | POA: Diagnosis not present

## 2021-11-23 DIAGNOSIS — T451X5A Adverse effect of antineoplastic and immunosuppressive drugs, initial encounter: Secondary | ICD-10-CM | POA: Insufficient documentation

## 2021-11-23 DIAGNOSIS — C779 Secondary and unspecified malignant neoplasm of lymph node, unspecified: Secondary | ICD-10-CM | POA: Insufficient documentation

## 2021-11-23 DIAGNOSIS — C189 Malignant neoplasm of colon, unspecified: Secondary | ICD-10-CM

## 2021-11-23 DIAGNOSIS — F319 Bipolar disorder, unspecified: Secondary | ICD-10-CM | POA: Insufficient documentation

## 2021-11-23 DIAGNOSIS — D6959 Other secondary thrombocytopenia: Secondary | ICD-10-CM | POA: Diagnosis not present

## 2021-11-23 LAB — COMPREHENSIVE METABOLIC PANEL
ALT: 58 U/L — ABNORMAL HIGH (ref 0–44)
AST: 49 U/L — ABNORMAL HIGH (ref 15–41)
Albumin: 4 g/dL (ref 3.5–5.0)
Alkaline Phosphatase: 61 U/L (ref 38–126)
Anion gap: 6 (ref 5–15)
BUN: 14 mg/dL (ref 6–20)
CO2: 28 mmol/L (ref 22–32)
Calcium: 9.4 mg/dL (ref 8.9–10.3)
Chloride: 105 mmol/L (ref 98–111)
Creatinine, Ser: 0.97 mg/dL (ref 0.61–1.24)
GFR, Estimated: >60 mL/min (ref >60)
Glucose, Bld: 85 mg/dL (ref 70–99)
Potassium: 4.2 mmol/L (ref 3.5–5.1)
Sodium: 139 mmol/L (ref 135–145)
Total Bilirubin: 0.4 mg/dL (ref 0.3–1.2)
Total Protein: 6.7 g/dL (ref 6.5–8.1)

## 2021-11-23 LAB — CBC WITH DIFFERENTIAL/PLATELET
Abs Immature Granulocytes: 0.01 10*3/uL (ref 0.00–0.07)
Basophils Absolute: 0 10*3/uL (ref 0.0–0.1)
Basophils Relative: 1 %
Eosinophils Absolute: 0 10*3/uL (ref 0.0–0.5)
Eosinophils Relative: 1 %
HCT: 41.9 % (ref 39.0–52.0)
Hemoglobin: 14 g/dL (ref 13.0–17.0)
Immature Granulocytes: 0 %
Lymphocytes Relative: 34 %
Lymphs Abs: 1.4 10*3/uL (ref 0.7–4.0)
MCH: 31.7 pg (ref 26.0–34.0)
MCHC: 33.4 g/dL (ref 30.0–36.0)
MCV: 94.8 fL (ref 80.0–100.0)
Monocytes Absolute: 0.6 10*3/uL (ref 0.1–1.0)
Monocytes Relative: 14 %
Neutro Abs: 2.2 10*3/uL (ref 1.7–7.7)
Neutrophils Relative %: 50 %
Platelets: 126 10*3/uL — ABNORMAL LOW (ref 150–400)
RBC: 4.42 MIL/uL (ref 4.22–5.81)
RDW: 17.2 % — ABNORMAL HIGH (ref 11.5–15.5)
WBC: 4.3 10*3/uL (ref 4.0–10.5)
nRBC: 0 % (ref 0.0–0.2)

## 2021-11-23 MED ORDER — DEXTROSE 5 % IV SOLN
Freq: Once | INTRAVENOUS | Status: AC
  Filled 2021-11-23: qty 250

## 2021-11-23 MED ORDER — PALONOSETRON HCL INJECTION 0.25 MG/5ML
0.2500 mg | Freq: Once | INTRAVENOUS | Status: AC
  Administered 2021-11-23: 0.25 mg via INTRAVENOUS

## 2021-11-23 MED ORDER — LEUCOVORIN CALCIUM INJECTION 350 MG
800.0000 mg | Freq: Once | INTRAVENOUS | Status: AC
  Administered 2021-11-23: 800 mg via INTRAVENOUS
  Filled 2021-11-23: qty 40

## 2021-11-23 MED ORDER — SODIUM CHLORIDE 0.9 % IV SOLN
2400.0000 mg/m2 | INTRAVENOUS | Status: DC
  Administered 2021-11-23: 4700 mg via INTRAVENOUS
  Filled 2021-11-23: qty 94

## 2021-11-23 MED ORDER — OXALIPLATIN CHEMO INJECTION 100 MG/20ML
85.0000 mg/m2 | Freq: Once | INTRAVENOUS | Status: AC
  Administered 2021-11-23: 165 mg via INTRAVENOUS
  Filled 2021-11-23: qty 33

## 2021-11-23 MED ORDER — SODIUM CHLORIDE 0.9 % IV SOLN
10.0000 mg | Freq: Once | INTRAVENOUS | Status: AC
  Administered 2021-11-23: 10 mg via INTRAVENOUS
  Filled 2021-11-23: qty 10

## 2021-11-23 MED ORDER — FLUOROURACIL CHEMO INJECTION 2.5 GM/50ML
400.0000 mg/m2 | Freq: Once | INTRAVENOUS | Status: AC
  Administered 2021-11-23: 800 mg via INTRAVENOUS
  Filled 2021-11-23: qty 16

## 2021-11-23 NOTE — Progress Notes (Signed)
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  He was found to have a hemoglobin of 4.5/18.7 on admission.  He received blood transfusion as well as IV iron and his hemoglobin is presently up to an 8.  He underwent colonoscopy which showed malignant partially obstructing tumor that was circumferential 80 cm proximal to the anus.  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  CT chest abdomen and pelvis with contrast did not show any evidence of distant metastatic disease.  2 small sclerotic densities in the acetabulum and proximal right femur likely benign process.  Pathologically enlarged lymph nodes superior to circumferential colonic lesion suggesting metastatic adenopathy.   Patient underwent right hemicolectomy with Dr. Dahlia Byes on 07/12/2021.  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  Vermiform appendix negative for tumor.  Radial margin positive for tumor (2 lymph nodes at  margin with adenocarcinoma).  Proximal and distal margins negative.  7 out of 56 lymph nodes positive for metastatic adenocarcinoma.  Tumor deposits not applicable.  No lymphovascular or perineural invasion seen.  [id number]   Fertility conservation was also discussed an option of sperm banking at [facility] was discussed with the patient and his healthcare power of attorney [name].  There would be a one-time cost involved for sperm banking and a yearly cost for preserving the specimen.  Overall chances of infertility with FOLFOX is low but possible.  Patient and his healthcare power of attorney have discussed their options and have decided not to proceed with fertility conservation at this time   Plan is for 12 cycles of adjuvant FOLFOX followed by consideration for radiation given T4 lesion  Interval history- Patient states that he is doing really well. Tolerating treatment with very limited side effects. Has some nosebleeds when he tried to clean some scabs that are in his nose from prior nosebleeds. He is currently using tweezers to remove the scabs then using alcohol on a Q tip to clean the area. Nosebleeds able to be stopped within 5 minutes and do not occur without digital trauma. Other than this he reports good appetite, no fevers, night sweats. Denies neuropathy.   ECOG PS- 0 Pain scale- 0  Review of systems- Review of Systems  Constitutional:  Positive for malaise/fatigue. Negative for chills, fever and weight loss.  HENT:  Positive for nosebleeds. Negative for congestion and ear discharge.   Eyes:  Negative for blurred vision.  Respiratory:  Negative for cough, hemoptysis, sputum production, shortness of breath and wheezing.   Cardiovascular:  Negative for chest pain, palpitations, orthopnea and claudication.  Gastrointestinal:  Negative for abdominal pain, blood in stool,  constipation, diarrhea, heartburn, melena, nausea and vomiting.  Genitourinary:  Negative for dysuria, flank pain,  frequency, hematuria and urgency.  Musculoskeletal:  Negative for back pain, joint pain and myalgias.  Skin:  Negative for rash.  Neurological:  Negative for dizziness, tingling, focal weakness, seizures, weakness and headaches.  Endo/Heme/Allergies:  Does not bruise/bleed easily.  Psychiatric/Behavioral:  Negative for depression and suicidal ideas. The patient does not have insomnia.       Allergies  Allergen Reactions   Geodon [Ziprasidone Hcl] Anaphylaxis     Past Medical History:  Diagnosis Date   Bipolar 1 disorder ([place])    Colon cancer ([place])    PTSD (post-traumatic stress disorder)      Past Surgical History:  Procedure Laterality Date   COLON RESECTION N/A 07/12/2021   Procedure: HAND ASSISTED LAPAROSCOPIC COLON RESECTION;  Surgeon: Jules Husbands, MD;  Location: [facility] ORS;  Service: General;  Laterality: N/A;   COLONOSCOPY WITH PROPOFOL N/A 07/06/2021   Procedure: COLONOSCOPY WITH PROPOFOL;  Surgeon: Lin Landsman, MD;  Location: [facility] ENDOSCOPY;  Service: Gastroenterology;  Laterality: N/A;   COLONOSCOPY WITH PROPOFOL N/A 07/07/2021   Procedure: COLONOSCOPY WITH PROPOFOL;  Surgeon: Lin Landsman, MD;  Location: [facility] ENDOSCOPY;  Service: Gastroenterology;  Laterality: N/A;   ESOPHAGOGASTRODUODENOSCOPY (EGD) WITH PROPOFOL N/A 07/06/2021   Procedure: ESOPHAGOGASTRODUODENOSCOPY (EGD) WITH PROPOFOL;  Surgeon: Lin Landsman, MD;  Location: [facility] ENDOSCOPY;  Service: Gastroenterology;  Laterality: N/A;   GIVENS CAPSULE STUDY N/A 07/06/2021   Procedure: GIVENS CAPSULE STUDY;  Surgeon: Lin Landsman, MD;  Location: [facility] ENDOSCOPY;  Service: Gastroenterology;  Laterality: N/A;   PORTACATH PLACEMENT Right 07/22/2021   Procedure: INSERTION PORT-A-CATH;  Surgeon: Jules Husbands, MD;  Location: [facility] ORS;  Service: General;  Laterality: Right;    Social History   Socioeconomic History   Marital status: Single    Spouse name: Not on file   Number of children: Not on  file   Years of education: Not on file   Highest education level: Not on file  Occupational History   Not on file  Tobacco Use   Smoking status: Every Day    Packs/day: 0.25    Types: Cigarettes   Smokeless tobacco: Never   Tobacco comments:    1 cig a 5 days  Vaping Use   Vaping Use: Every day   Devices: elfbar  Substance and Sexual Activity   Alcohol use: Not Currently   Drug use: Not Currently   Sexual activity: Yes  Other Topics Concern   Not on file  Social History Narrative   Not on file   Social Determinants of Health   Financial Resource Strain: Not on file  Food Insecurity: Not on file  Transportation Needs: Not on file  Physical Activity: Not on file  Stress: Not on file  Social Connections: Not on file  Intimate Partner Violence: Not on file    Family History  Problem Relation Age of Onset   Kidney cancer Father    Breast cancer Maternal Aunt      Current Outpatient Medications:    ARIPiprazole ER (ABILIFY MAINTENA) 400 MG PRSY prefilled syringe, Inject 400 mg into the muscle every 21 ( twenty-one) days., Disp: , Rfl:    benztropine (COGENTIN) 2 MG tablet, Take 2 mg by mouth at bedtime., Disp: , Rfl:    busPIRone (BUSPAR) 10 MG tablet, Take 10 mg by mouth 2 (two) times daily., Disp: , Rfl:    dexamethasone (DECADRON) 4 MG  tablet, Take 2 tablets (8 mg total) by mouth daily. Start the day after chemotherapy for 2 days. Take with food., Disp: 30 tablet, Rfl: 1   FLUoxetine (PROZAC) 20 MG capsule, Take 20 mg by mouth daily., Disp: , Rfl:    ibuprofen (ADVIL) 600 MG tablet, Take 1 tablet (600 mg total) by mouth every 6 (six) hours as needed., Disp: 30 tablet, Rfl: 0   lidocaine-prilocaine (EMLA) cream, Apply to affected area once, Disp: 30 g, Rfl: 3   polyethylene glycol (MIRALAX / GLYCOLAX) 17 g packet, Take 17 g by mouth daily as needed (constipation)., Disp: 14 each, Rfl: 0   prazosin (MINIPRESS) 2 MG capsule, Take 2 mg by mouth 2 (two) times daily., Disp:  , Rfl:    ondansetron (ZOFRAN) 8 MG tablet, Take 1 tablet (8 mg total) by mouth 2 (two) times daily as needed for refractory nausea / vomiting. Start on day 3 after chemotherapy. (Patient not taking: Reported on 09/28/2021), Disp: 30 tablet, Rfl: 1   oxyCODONE (OXY IR/ROXICODONE) 5 MG immediate release tablet, Take 1 tablet (5 mg total) by mouth every 6 (six) hours as needed for severe pain or breakthrough pain. (Patient not taking: Reported on 08/17/2021), Disp: 25 tablet, Rfl: 0   prochlorperazine (COMPAZINE) 10 MG tablet, Take 1 tablet (10 mg total) by mouth every 6 (six) hours as needed (Nausea or vomiting). (Patient not taking: Reported on 10/12/2021), Disp: 30 tablet, Rfl: 1  Physical exam:  Vitals:   11/23/21 0857  BP: 123/73  Pulse: 92  Resp: 16  Temp: (!) 97.3 F (36.3 C)  TempSrc: Tympanic  SpO2: 99%  Weight: 177 lb (80.3 kg)   Physical Exam Cardiovascular:     Rate and Rhythm: Normal rate and regular rhythm.     Heart sounds: Normal heart sounds.  Pulmonary:     Effort: Pulmonary effort is normal.     Breath sounds: Normal breath sounds.  Skin:    General: Skin is warm and dry.  Neurological:     Mental Status: He is alert and oriented to person, place, and time.         Latest Ref Rng & Units 11/23/2021    8:23 AM  CMP  Glucose 70 - 99 mg/dL 85   BUN 6 - 20 mg/dL 14   Creatinine 0.61 - 1.24 mg/dL 0.97   Sodium 135 - 145 mmol/L 139   Potassium 3.5 - 5.1 mmol/L 4.2   Chloride 98 - 111 mmol/L 105   CO2 22 - 32 mmol/L 28   Calcium 8.9 - 10.3 mg/dL 9.4   Total Protein 6.5 - 8.1 g/dL 6.7   Total Bilirubin 0.3 - 1.2 mg/dL 0.4   Alkaline Phos 38 - 126 U/L 61   AST 15 - 41 U/L 49   ALT 0 - 44 U/L 58       Latest Ref Rng & Units 11/23/2021    8:23 AM  CBC  WBC 4.0 - 10.5 K/uL 4.3   Hemoglobin 13.0 - 17.0 g/dL 14.0   Hematocrit 39.0 - 52.0 % 41.9   Platelets 150 - 400 K/uL 126      Assessment and plan- Patient is a 37 y.o. male with h/o diagnosed stage IIIc  adenocarcinoma of the colon [id number] s/p right hemicolectomy.  He is here for on treatment assessment prior to cycle 8 of adjuvant FOLFOX chemotherapy  Blood work reviewed and acceptable to proceed forward with cycle 9 of Folfox chemotherapy. He will see Dr.  Rao back in 2 weeks for consideration of cycle 10 of treatment. Per Dr. Elroy Channel last note, "Plan is to complete 12 cycles with consideration of adjuvant radiation therapy thereafter.  He is receiving oxaliplatin at full dose of 85 mg per metered squared".   Abnormal LFTs: Improving. Thought to be secondary to chemotherapy. Will continue to monitor.   Nosebleeds: Secondary to digital trauma. He will avoid removing the scabs and will not clean with alcohol. Instead he will lightly apply Aquaphor to a Q-tip and gently pat in nose. Should heal within 2 weeks- if not ENT consult recommended.    Visit Diagnosis 1. Adenocarcinoma of colon ([place])   2. Encounter for antineoplastic chemotherapy   3. Abnormal LFTs   4. Frequent nosebleeds      Nelwyn Salisbury PA-C [place] at [facility] 11/23/2021 9:22 AM

## 2021-11-23 NOTE — Patient Instructions (Signed)
[facility] AT [place]  Discharge Instructions: Thank you for choosing [facility] to provide your oncology and hematology care.  If you have a lab appointment with the [facility], please go directly to the [facility] and check in at the registration area.  Wear comfortable clothing and clothing appropriate for easy access to any Portacath or PICC line.   We strive to give you quality time with your provider. You may need to reschedule your appointment if you arrive late (15 or more minutes).  Arriving late affects you and other patients whose appointments are after yours.  Also, if you miss three or more appointments without notifying the office, you may be dismissed from the clinic at the provider's discretion.      For prescription refill requests, have your pharmacy contact our office and allow 72 hours for refills to be completed.    Today you received the following chemotherapy and/or immunotherapy agents : Oxaliplatin, 5FU      To help prevent nausea and vomiting after your treatment, we encourage you to take your nausea medication as directed.  BELOW ARE SYMPTOMS THAT SHOULD BE REPORTED IMMEDIATELY: *FEVER GREATER THAN 100.4 F (38 C) OR HIGHER *CHILLS OR SWEATING *NAUSEA AND VOMITING THAT IS NOT CONTROLLED WITH YOUR NAUSEA MEDICATION *UNUSUAL SHORTNESS OF BREATH *UNUSUAL BRUISING OR BLEEDING *URINARY PROBLEMS (pain or burning when urinating, or frequent urination) *BOWEL PROBLEMS (unusual diarrhea, constipation, pain near the anus) TENDERNESS IN MOUTH AND THROAT WITH OR WITHOUT PRESENCE OF ULCERS (sore throat, sores in mouth, or a toothache) UNUSUAL RASH, SWELLING OR PAIN  UNUSUAL VAGINAL DISCHARGE OR ITCHING   Items with * indicate a potential emergency and should be followed up as soon as possible or go to the Emergency Department if any problems should occur.  Please show the CHEMOTHERAPY ALERT CARD or IMMUNOTHERAPY ALERT CARD at  check-in to the Emergency Department and triage nurse.  Should you have questions after your visit or need to cancel or reschedule your appointment, please contact [facility] CANC[facility] [place] AT [place]  [phone] and follow the prompts.  Office hours are 8:00 a.m. to 4:30 p.m. Monday - Friday. Please note that voicemails left after 4:00 p.m. may not be returned until the following business day.  We are closed weekends and major holidays. You have access to a nurse at all times for urgent questions. Please call the main number to the clinic [phone] and follow the prompts.  For any non-urgent questions, you may also contact your provider using MyChart. We now offer e-Visits for anyone 73 and older to request care online for non-urgent symptoms. For details visit [email or web address].   Also download the MyChart app! Go to the app store, search "MyChart", open the app, select [place], and log in with your MyChart username and password.  Masks are optional in the cancer centers. If you would like for your care team to wear a mask while they are taking care of you, please let them know. For doctor visits, patients may have with them one support person who is at least 34 years old. At this time, visitors are not allowed in the infusion area.

## 2021-11-23 NOTE — Progress Notes (Signed)
Pt in for follow up and treatment today.  Reports has been having   Established Patient Office Visit  Subjective   Patient ID: Connor Wilkinson, male    DOB: 03/30/1986  Age: 35 y.o. MRN: [id number]  Chief Complaint  Patient presents with   Adenocarcinoma of colon    HPI    ROS    Objective:     There were no vitals taken for this visit.   Physical Exam   Results for orders placed or performed in visit on 11/23/21  Comprehensive metabolic panel  Result Value Ref Range   Sodium 139 135 - 145 mmol/L   Potassium 4.2 3.5 - 5.1 mmol/L   Chloride 105 98 - 111 mmol/L   CO2 28 22 - 32 mmol/L   Glucose, Bld 85 70 - 99 mg/dL   BUN 14 6 - 20 mg/dL   Creatinine, Ser 0.97 0.61 - 1.24 mg/dL   Calcium 9.4 8.9 - 10.3 mg/dL   Total Protein 6.7 6.5 - 8.1 g/dL   Albumin 4.0 3.5 - 5.0 g/dL   AST 49 (H) 15 - 41 U/L   ALT 58 (H) 0 - 44 U/L   Alkaline Phosphatase 61 38 - 126 U/L   Total Bilirubin 0.4 0.3 - 1.2 mg/dL   GFR, Estimated >60 >60 mL/min   Anion gap 6 5 - 15  CBC with Differential  Result Value Ref Range   WBC 4.3 4.0 - 10.5 K/uL   RBC 4.42 4.22 - 5.81 MIL/uL   Hemoglobin 14.0 13.0 - 17.0 g/dL   HCT 41.9 39.0 - 52.0 %   MCV 94.8 80.0 - 100.0 fL   MCH 31.7 26.0 - 34.0 pg   MCHC 33.4 30.0 - 36.0 g/dL   RDW 17.2 (H) 11.5 - 15.5 %   Platelets 126 (L) 150 - 400 K/uL   nRBC 0.0 0.0 - 0.2 %   Neutrophils Relative % 50 %   Neutro Abs 2.2 1.7 - 7.7 K/uL   Lymphocytes Relative 34 %   Lymphs Abs 1.4 0.7 - 4.0 K/uL   Monocytes Relative 14 %   Monocytes Absolute 0.6 0.1 - 1.0 K/uL   Eosinophils Relative 1 %   Eosinophils Absolute 0.0 0.0 - 0.5 K/uL   Basophils Relative 1 %   Basophils Absolute 0.0 0.0 - 0.1 K/uL   Immature Granulocytes 0 %   Abs Immature Granulocytes 0.01 0.00 - 0.07 K/uL      The ASCVD Risk score (Arnett DK, et al., 2019) failed to calculate for the following reasons:   The 2019 ASCVD risk score is only valid for ages 18 to 15    Assessment & Plan:    Problem List Items Addressed This Visit   None   No follow-ups on file.    Robina Ade, RN nosebleeds on a daily basis.

## 2021-11-25 ENCOUNTER — Inpatient Hospital Stay: Payer: Medicaid Other

## 2021-11-25 VITALS — BP 128/80

## 2021-11-25 DIAGNOSIS — C189 Malignant neoplasm of colon, unspecified: Secondary | ICD-10-CM

## 2021-11-25 DIAGNOSIS — Z5111 Encounter for antineoplastic chemotherapy: Secondary | ICD-10-CM | POA: Diagnosis not present

## 2021-11-25 MED ORDER — HEPARIN SOD (PORK) LOCK FLUSH 100 UNIT/ML IV SOLN
500.0000 [IU] | Freq: Once | INTRAVENOUS | Status: AC | PRN
  Administered 2021-11-25: 500 [IU]
  Filled 2021-11-25: qty 5

## 2021-11-25 MED ORDER — SODIUM CHLORIDE 0.9% FLUSH
10.0000 mL | INTRAVENOUS | Status: DC | PRN
  Administered 2021-11-25: 10 mL
  Filled 2021-11-25: qty 10

## 2021-12-06 MED FILL — Dexamethasone Sodium Phosphate Inj 100 MG/10ML: INTRAMUSCULAR | Qty: 1 | Status: AC

## 2021-12-07 ENCOUNTER — Inpatient Hospital Stay: Payer: Medicaid Other

## 2021-12-07 ENCOUNTER — Inpatient Hospital Stay (HOSPITAL_BASED_OUTPATIENT_CLINIC_OR_DEPARTMENT_OTHER): Payer: Medicaid Other | Admitting: Oncology

## 2021-12-07 ENCOUNTER — Encounter: Payer: Self-pay | Admitting: Oncology

## 2021-12-07 VITALS — BP 114/78 | HR 85 | Temp 98.4°F | Resp 18 | Wt 174.1 lb

## 2021-12-07 DIAGNOSIS — T451X5A Adverse effect of antineoplastic and immunosuppressive drugs, initial encounter: Secondary | ICD-10-CM | POA: Diagnosis not present

## 2021-12-07 DIAGNOSIS — C189 Malignant neoplasm of colon, unspecified: Secondary | ICD-10-CM

## 2021-12-07 DIAGNOSIS — D6959 Other secondary thrombocytopenia: Secondary | ICD-10-CM | POA: Diagnosis not present

## 2021-12-07 DIAGNOSIS — Z5111 Encounter for antineoplastic chemotherapy: Secondary | ICD-10-CM | POA: Diagnosis not present

## 2021-12-07 LAB — CBC WITH DIFFERENTIAL/PLATELET
Abs Immature Granulocytes: 0.01 10*3/uL (ref 0.00–0.07)
Basophils Absolute: 0 10*3/uL (ref 0.0–0.1)
Basophils Relative: 1 %
Eosinophils Absolute: 0 10*3/uL (ref 0.0–0.5)
Eosinophils Relative: 1 %
HCT: 41.4 % (ref 39.0–52.0)
Hemoglobin: 13.9 g/dL (ref 13.0–17.0)
Immature Granulocytes: 0 %
Lymphocytes Relative: 26 %
Lymphs Abs: 1.1 10*3/uL (ref 0.7–4.0)
MCH: 32.2 pg (ref 26.0–34.0)
MCHC: 33.6 g/dL (ref 30.0–36.0)
MCV: 95.8 fL (ref 80.0–100.0)
Monocytes Absolute: 0.8 10*3/uL (ref 0.1–1.0)
Monocytes Relative: 18 %
Neutro Abs: 2.4 10*3/uL (ref 1.7–7.7)
Neutrophils Relative %: 54 %
Platelets: 148 10*3/uL — ABNORMAL LOW (ref 150–400)
RBC: 4.32 MIL/uL (ref 4.22–5.81)
RDW: 16.5 % — ABNORMAL HIGH (ref 11.5–15.5)
WBC: 4.4 10*3/uL (ref 4.0–10.5)
nRBC: 0 % (ref 0.0–0.2)

## 2021-12-07 LAB — COMPREHENSIVE METABOLIC PANEL
ALT: 40 U/L (ref 0–44)
AST: 39 U/L (ref 15–41)
Albumin: 3.8 g/dL (ref 3.5–5.0)
Alkaline Phosphatase: 55 U/L (ref 38–126)
Anion gap: 7 (ref 5–15)
BUN: 13 mg/dL (ref 6–20)
CO2: 25 mmol/L (ref 22–32)
Calcium: 8.9 mg/dL (ref 8.9–10.3)
Chloride: 107 mmol/L (ref 98–111)
Creatinine, Ser: 0.86 mg/dL (ref 0.61–1.24)
GFR, Estimated: >60 mL/min (ref >60)
Glucose, Bld: 80 mg/dL (ref 70–99)
Potassium: 3.7 mmol/L (ref 3.5–5.1)
Sodium: 139 mmol/L (ref 135–145)
Total Bilirubin: 0.7 mg/dL (ref 0.3–1.2)
Total Protein: 6.6 g/dL (ref 6.5–8.1)

## 2021-12-07 MED ORDER — SODIUM CHLORIDE 0.9 % IV SOLN
2400.0000 mg/m2 | INTRAVENOUS | Status: DC
  Administered 2021-12-07: 4850 mg via INTRAVENOUS
  Filled 2021-12-07: qty 97

## 2021-12-07 MED ORDER — DEXTROSE 5 % IV SOLN
Freq: Once | INTRAVENOUS | Status: AC
  Filled 2021-12-07: qty 250

## 2021-12-07 MED ORDER — SODIUM CHLORIDE 0.9% FLUSH
10.0000 mL | Freq: Once | INTRAVENOUS | Status: AC
  Administered 2021-12-07: 10 mL via INTRAVENOUS
  Filled 2021-12-07: qty 10

## 2021-12-07 MED ORDER — OXALIPLATIN CHEMO INJECTION 100 MG/20ML
85.0000 mg/m2 | Freq: Once | INTRAVENOUS | Status: AC
  Administered 2021-12-07: 170 mg via INTRAVENOUS
  Filled 2021-12-07: qty 34

## 2021-12-07 MED ORDER — FLUOROURACIL CHEMO INJECTION 2.5 GM/50ML
400.0000 mg/m2 | Freq: Once | INTRAVENOUS | Status: AC
  Administered 2021-12-07: 800 mg via INTRAVENOUS
  Filled 2021-12-07: qty 16

## 2021-12-07 MED ORDER — SODIUM CHLORIDE 0.9 % IV SOLN
10.0000 mg | Freq: Once | INTRAVENOUS | Status: AC
  Administered 2021-12-07: 10 mg via INTRAVENOUS
  Filled 2021-12-07: qty 1

## 2021-12-07 MED ORDER — LEUCOVORIN CALCIUM INJECTION 350 MG
800.0000 mg | Freq: Once | INTRAVENOUS | Status: AC
  Administered 2021-12-07: 800 mg via INTRAVENOUS
  Filled 2021-12-07: qty 40

## 2021-12-07 MED ORDER — PALONOSETRON HCL INJECTION 0.25 MG/5ML
0.2500 mg | Freq: Once | INTRAVENOUS | Status: AC
  Administered 2021-12-07: 0.25 mg via INTRAVENOUS
  Filled 2021-12-07: qty 5

## 2021-12-07 NOTE — Progress Notes (Signed)
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  He was found to have a hemoglobin of 4.5/18.7 on admission.  He received blood transfusion as well as IV iron and his hemoglobin is presently up to an 8.  He underwent colonoscopy which showed malignant partially obstructing tumor that was circumferential 80 cm proximal to the anus.  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  CT chest abdomen and pelvis with contrast did not show any evidence of distant metastatic disease.  2 small sclerotic densities in the acetabulum and proximal right femur likely benign process.  Pathologically enlarged lymph nodes superior to circumferential colonic lesion suggesting metastatic adenopathy.   Patient underwent right hemicolectomy with Dr. Dahlia Byes on 07/12/2021.  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  Vermiform appendix negative for tumor.  Radial margin positive for tumor (2 lymph  nodes at margin with adenocarcinoma).  Proximal and distal margins negative.  7 out of 56 lymph nodes positive for metastatic adenocarcinoma.  Tumor deposits not applicable.  No lymphovascular or perineural invasion seen.  [id number]   Fertility conservation was also discussed an option of sperm banking at [facility] was discussed with the patient and his healthcare power of attorney [name].  There would be a one-time cost involved for sperm banking and a yearly cost for preserving the specimen.  Overall chances of infertility with FOLFOX is low but possible.  Patient and his healthcare power of attorney have discussed their options and have decided not to proceed with fertility conservation at this time   Plan is for 12 cycles of adjuvant FOLFOX followed by consideration for radiation given T4 lesion  Interval history-tolerating chemotherapy well without any significant side effects.  He has mild tingling numbness only for the first 2 days following chemotherapy and it resolved subsequently.  ECOG PS- 0 Pain scale- 0   Review of systems- Review of Systems  Constitutional:  Negative for chills, fever, malaise/fatigue and weight loss.  HENT:  Negative for congestion, ear discharge and nosebleeds.   Eyes:  Negative for blurred vision.  Respiratory:  Negative for cough, hemoptysis, sputum production, shortness of breath and wheezing.   Cardiovascular:  Negative for chest pain, palpitations, orthopnea and claudication.  Gastrointestinal:  Negative for abdominal pain, blood in stool, constipation, diarrhea, heartburn, melena, nausea and vomiting.  Genitourinary:  Negative for dysuria, flank pain, frequency, hematuria and urgency.  Musculoskeletal:  Negative for back pain, joint pain and myalgias.  Skin:  Negative for rash.  Neurological:  Negative for dizziness, tingling, focal weakness, seizures, weakness and headaches.  Endo/Heme/Allergies:  Does not bruise/bleed easily.  Psychiatric/Behavioral:   Negative for depression and suicidal ideas. The patient does not have insomnia.       Allergies  Allergen Reactions   Geodon [Ziprasidone Hcl] Anaphylaxis     Past Medical History:  Diagnosis Date   Bipolar 1 disorder ([place])    Colon cancer ([place])    PTSD (post-traumatic stress disorder)      Past Surgical History:  Procedure Laterality Date   COLON RESECTION N/A 07/12/2021   Procedure: HAND ASSISTED LAPAROSCOPIC COLON RESECTION;  Surgeon: Jules Husbands, MD;  Location: [facility] ORS;  Service: General;  Laterality: N/A;   COLONOSCOPY WITH PROPOFOL N/A 07/06/2021   Procedure: COLONOSCOPY WITH PROPOFOL;  Surgeon: Lin Landsman, MD;  Location: [facility] ENDOSCOPY;  Service: Gastroenterology;  Laterality: N/A;   COLONOSCOPY WITH PROPOFOL N/A 07/07/2021   Procedure: COLONOSCOPY WITH PROPOFOL;  Surgeon: Lin Landsman, MD;  Location: [facility] ENDOSCOPY;  Service: Gastroenterology;  Laterality: N/A;   ESOPHAGOGASTRODUODENOSCOPY (EGD) WITH PROPOFOL N/A 07/06/2021   Procedure: ESOPHAGOGASTRODUODENOSCOPY (EGD) WITH PROPOFOL;  Surgeon: Lin Landsman, MD;  Location: [facility] ENDOSCOPY;  Service: Gastroenterology;  Laterality: N/A;   GIVENS CAPSULE STUDY N/A 07/06/2021   Procedure: GIVENS CAPSULE STUDY;  Surgeon: Lin Landsman, MD;  Location: [facility] ENDOSCOPY;  Service: Gastroenterology;  Laterality: N/A;   PORTACATH PLACEMENT Right 07/22/2021   Procedure: INSERTION PORT-A-CATH;  Surgeon: Jules Husbands, MD;  Location: [facility] ORS;  Service: General;  Laterality: Right;    Social History   Socioeconomic History   Marital status: Single    Spouse name: Not on file   Number of children: Not on file   Years of education: Not on file   Highest education level: Not on file  Occupational History   Not on file  Tobacco Use   Smoking status: Every Day    Packs/day: 0.25    Types: Cigarettes   Smokeless tobacco: Never   Tobacco comments:    1 cig a 5 days  Vaping Use   Vaping Use: Every day    Devices: elfbar  Substance and Sexual Activity   Alcohol use: Not Currently   Drug use: Not Currently   Sexual activity: Yes  Other Topics Concern   Not on file  Social History Narrative   Not on file   Social Determinants of Health   Financial Resource Strain: Not on file  Food Insecurity: Not on file  Transportation Needs: Not on file  Physical Activity: Not on file  Stress: Not on file  Social Connections: Not on file  Intimate Partner Violence: Not on file    Family History  Problem Relation Age of Onset   Kidney cancer Father    Breast cancer Maternal Aunt      Current Outpatient Medications:    benztropine (COGENTIN) 2 MG tablet, Take 2 mg by mouth at bedtime., Disp: , Rfl:    busPIRone (BUSPAR) 10 MG tablet, Take 10 mg by mouth 2 (two) times daily., Disp: , Rfl:    FLUoxetine (PROZAC) 20 MG capsule, Take 20 mg by mouth daily., Disp: , Rfl:    ibuprofen (ADVIL) 600 MG tablet, Take 1 tablet (600 mg total) by mouth every 6 (six) hours as needed., Disp: 30 tablet, Rfl: 0   polyethylene glycol (MIRALAX / GLYCOLAX) 17 g packet, Take 17 g by mouth daily as needed (constipation)., Disp: 14 each, Rfl: 0   prazosin (MINIPRESS) 2 MG capsule, Take 2 mg by mouth 2 (two) times daily., Disp: , Rfl:  ARIPiprazole ER (ABILIFY MAINTENA) 400 MG PRSY prefilled syringe, Inject 400 mg into the muscle every 21 ( twenty-one) days., Disp: , Rfl:    oxyCODONE (OXY IR/ROXICODONE) 5 MG immediate release tablet, Take 1 tablet (5 mg total) by mouth every 6 (six) hours as needed for severe pain or breakthrough pain. (Patient not taking: Reported on 08/17/2021), Disp: 25 tablet, Rfl: 0 No current facility-administered medications for this visit.  Facility-Administered Medications Ordered in Other Visits:    dexamethasone (DECADRON) 10 mg in sodium chloride 0.9 % 50 mL IVPB, 10 mg, Intravenous, Once, Sindy Guadeloupe, MD   fluorouracil (ADRUCIL) 4,850 mg in sodium chloride 0.9 % 53 mL chemo infusion,  2,400 mg/m2 (Treatment Plan Recorded), Intravenous, 1 day or 1 dose, Sindy Guadeloupe, MD   fluorouracil (ADRUCIL) chemo injection 800 mg, 400 mg/m2 (Treatment Plan Recorded), Intravenous, Once, Sindy Guadeloupe, MD   leucovorin 800 mg in dextrose 5 % 250 mL infusion, 800 mg, Intravenous, Once, Sindy Guadeloupe, MD   oxaliplatin (ELOXATIN) 170 mg in dextrose 5 % 500 mL chemo infusion, 85 mg/m2 (Treatment Plan Recorded), Intravenous, Once, Sindy Guadeloupe, MD   palonosetron (ALOXI) injection 0.25 mg, 0.25 mg, Intravenous, Once, Sindy Guadeloupe, MD  Physical exam:  Vitals:   12/07/21 0830  BP: 114/78  Pulse: 85  Resp: 18  Temp: 98.4 F (36.9 C)  SpO2: 99%  Weight: 174 lb 1.6 oz (79 kg)   Physical Exam Constitutional:      General: He is not in acute distress. Cardiovascular:     Rate and Rhythm: Normal rate and regular rhythm.     Heart sounds: Normal heart sounds.  Pulmonary:     Effort: Pulmonary effort is normal.     Breath sounds: Normal breath sounds.  Skin:    General: Skin is warm and dry.  Neurological:     Mental Status: He is alert and oriented to person, place, and time.         Latest Ref Rng & Units 12/07/2021    8:13 AM  CMP  Glucose 70 - 99 mg/dL 80   BUN 6 - 20 mg/dL 13   Creatinine 0.61 - 1.24 mg/dL 0.86   Sodium 135 - 145 mmol/L 139   Potassium 3.5 - 5.1 mmol/L 3.7   Chloride 98 - 111 mmol/L 107   CO2 22 - 32 mmol/L 25   Calcium 8.9 - 10.3 mg/dL 8.9   Total Protein 6.5 - 8.1 g/dL 6.6   Total Bilirubin 0.3 - 1.2 mg/dL 0.7   Alkaline Phos 38 - 126 U/L 55   AST 15 - 41 U/L 39   ALT 0 - 44 U/L 40       Latest Ref Rng & Units 12/07/2021    8:13 AM  CBC  WBC 4.0 - 10.5 K/uL 4.4   Hemoglobin 13.0 - 17.0 g/dL 13.9   Hematocrit 39.0 - 52.0 % 41.4   Platelets 150 - 400 K/uL 148      Assessment and plan- Patient is a 36 y.o. male with h/o diagnosed stage IIIc adenocarcinoma of the colon [id number] s/p right hemicolectomy.  He is here for on treatment assessment  prior to cycle 10 of adjuvant FOLFOX chemotherapy  Counts okay to proceed with cycle 10 of adjuvant FOLFOX chemotherapy today with pump disconnect on day 3.  I will see him back in 2 weeks for cycle 11.  Plan is to complete 12 cycles.  I am referring him to  radiation oncology at this time for consideration of adjuvant radiation given T4 lesion which he can potentially start after he completes 12 cycles of chemotherapy  Abnormal LFTs: Resolved  Chemo-induced thrombocytopenia: Likely secondary to oxaliplatin.  Mild continue to monitor   Visit Diagnosis 1. Adenocarcinoma of colon ([place])   2. Encounter for antineoplastic chemotherapy   3. Chemotherapy-induced thrombocytopenia      Dr. Randa Evens, MD, MPH [facility] [phone] 12/07/2021 9:05 AM

## 2021-12-07 NOTE — Patient Instructions (Signed)
[facility] [place]  Discharge Instructions: Thank you for choosing [facility] to provide your oncology and hematology care.  If you have a lab appointment with the Cancer Center, please go directly to the Cancer Center and check in at the registration area.  Wear comfortable clothing and clothing appropriate for easy access to any Portacath or PICC line.   We strive to give you quality time with your provider. You may need to reschedule your appointment if you arrive late (15 or more minutes).  Arriving late affects you and other patients whose appointments are after yours.  Also, if you miss three or more appointments without notifying the office, you may be dismissed from the clinic at the provider's discretion.      For prescription refill requests, have your pharmacy contact our office and allow 72 hours for refills to be completed.    Today you received the following chemotherapy and/or immunotherapy agents      To help prevent nausea and vomiting after your treatment, we encourage you to take your nausea medication as directed.  BELOW ARE SYMPTOMS THAT SHOULD BE REPORTED IMMEDIATELY: *FEVER GREATER THAN 100.4 F (38 C) OR HIGHER *CHILLS OR SWEATING *NAUSEA AND VOMITING THAT IS NOT CONTROLLED WITH YOUR NAUSEA MEDICATION *UNUSUAL SHORTNESS OF BREATH *UNUSUAL BRUISING OR BLEEDING *URINARY PROBLEMS (pain or burning when urinating, or frequent urination) *BOWEL PROBLEMS (unusual diarrhea, constipation, pain near the anus) TENDERNESS IN MOUTH AND THROAT WITH OR WITHOUT PRESENCE OF ULCERS (sore throat, sores in mouth, or a toothache) UNUSUAL RASH, SWELLING OR PAIN  UNUSUAL VAGINAL DISCHARGE OR ITCHING   Items with * indicate a potential emergency and should be followed up as soon as possible or go to the Emergency Department if any problems should occur.  Please show the CHEMOTHERAPY ALERT CARD or IMMUNOTHERAPY ALERT CARD at check-in to the  Emergency Department and triage nurse.  Should you have questions after your visit or need to cancel or reschedule your appointment, please contact [facility] [place]  [phone] and follow the prompts.  Office hours are 8:00 a.m. to 4:30 p.m. Monday - Friday. Please note that voicemails left after 4:00 p.m. may not be returned until the following business day.  We are closed weekends and major holidays. You have access to a nurse at all times for urgent questions. Please call the main number to the clinic [phone] and follow the prompts.  For any non-urgent questions, you may also contact your provider using MyChart. We now offer e-Visits for anyone 18 and older to request care online for non-urgent symptoms. For details visit [email or web address].   Also download the MyChart app! Go to the app store, search "MyChart", open the app, select [facility], and log in with your MyChart username and password.  Masks are optional in the cancer centers. If you would like for your care team to wear a mask while they are taking care of you, please let them know. For doctor visits, patients may have with them one support person who is at least 34 years old. At this time, visitors are not allowed in the infusion area.   

## 2021-12-07 NOTE — Progress Notes (Signed)
ON PATHWAY REGIMEN - Colorectal  No Change  Continue With Treatment as Ordered.  Original Decision Date/Time: 07/21/2021 09:47     A cycle is every 14 days:     Oxaliplatin      Leucovorin      Fluorouracil      Fluorouracil   **Always confirm dose/schedule in your pharmacy ordering system**  Patient Characteristics: Postoperative without Neoadjuvant Therapy (Pathologic Staging), Colon, Stage III, High Risk (pT4 or pN2) Tumor Location: Colon Therapeutic Status: Postoperative without Neoadjuvant Therapy (Pathologic Staging) AJCC M Category: cM0 AJCC T Category: pT4a AJCC N Category: pN2b AJCC 8 Stage Grouping: IIIC Intent of Therapy: Curative Intent, Discussed with Patient

## 2021-12-08 ENCOUNTER — Other Ambulatory Visit: Payer: Self-pay

## 2021-12-09 ENCOUNTER — Inpatient Hospital Stay: Payer: Medicaid Other

## 2021-12-09 VITALS — BP 138/79

## 2021-12-09 DIAGNOSIS — Z5111 Encounter for antineoplastic chemotherapy: Secondary | ICD-10-CM | POA: Diagnosis not present

## 2021-12-09 DIAGNOSIS — C189 Malignant neoplasm of colon, unspecified: Secondary | ICD-10-CM

## 2021-12-09 MED ORDER — HEPARIN SOD (PORK) LOCK FLUSH 100 UNIT/ML IV SOLN
500.0000 [IU] | Freq: Once | INTRAVENOUS | Status: AC | PRN
  Administered 2021-12-09: 500 [IU]
  Filled 2021-12-09: qty 5

## 2021-12-09 MED ORDER — SODIUM CHLORIDE 0.9% FLUSH
10.0000 mL | INTRAVENOUS | Status: DC | PRN
  Administered 2021-12-09: 10 mL
  Filled 2021-12-09: qty 10

## 2021-12-09 NOTE — Patient Instructions (Signed)
[facility] [place]  Discharge Instructions: Thank you for choosing [facility] to provide your oncology and hematology care.  If you have a lab appointment with the Cancer Center, please go directly to the Cancer Center and check in at the registration area.  Wear comfortable clothing and clothing appropriate for easy access to any Portacath or PICC line.   We strive to give you quality time with your provider. You may need to reschedule your appointment if you arrive late (15 or more minutes).  Arriving late affects you and other patients whose appointments are after yours.  Also, if you miss three or more appointments without notifying the office, you may be dismissed from the clinic at the provider's discretion.      For prescription refill requests, have your pharmacy contact our office and allow 72 hours for refills to be completed.    Today you received the following chemotherapy and/or immunotherapy agents PUMP DC      To help prevent nausea and vomiting after your treatment, we encourage you to take your nausea medication as directed.  BELOW ARE SYMPTOMS THAT SHOULD BE REPORTED IMMEDIATELY: *FEVER GREATER THAN 100.4 F (38 C) OR HIGHER *CHILLS OR SWEATING *NAUSEA AND VOMITING THAT IS NOT CONTROLLED WITH YOUR NAUSEA MEDICATION *UNUSUAL SHORTNESS OF BREATH *UNUSUAL BRUISING OR BLEEDING *URINARY PROBLEMS (pain or burning when urinating, or frequent urination) *BOWEL PROBLEMS (unusual diarrhea, constipation, pain near the anus) TENDERNESS IN MOUTH AND THROAT WITH OR WITHOUT PRESENCE OF ULCERS (sore throat, sores in mouth, or a toothache) UNUSUAL RASH, SWELLING OR PAIN  UNUSUAL VAGINAL DISCHARGE OR ITCHING   Items with * indicate a potential emergency and should be followed up as soon as possible or go to the Emergency Department if any problems should occur.  Please show the CHEMOTHERAPY ALERT CARD or IMMUNOTHERAPY ALERT CARD at check-in to the  Emergency Department and triage nurse.  Should you have questions after your visit or need to cancel or reschedule your appointment, please contact [facility] [place]  [phone] and follow the prompts.  Office hours are 8:00 a.m. to 4:30 p.m. Monday - Friday. Please note that voicemails left after 4:00 p.m. may not be returned until the following business day.  We are closed weekends and major holidays. You have access to a nurse at all times for urgent questions. Please call the main number to the clinic [phone] and follow the prompts.  For any non-urgent questions, you may also contact your provider using MyChart. We now offer e-Visits for anyone 18 and older to request care online for non-urgent symptoms. For details visit [email or web address].   Also download the MyChart app! Go to the app store, search "MyChart", open the app, select , and log in with your MyChart username and password.  Masks are optional in the cancer centers. If you would like for your care team to wear a mask while they are taking care of you, please let them know. For doctor visits, patients may have with them one support person who is at least 34 years old. At this time, visitors are not allowed in the infusion area.   

## 2021-12-12 ENCOUNTER — Other Ambulatory Visit: Payer: Self-pay | Admitting: Oncology

## 2021-12-12 ENCOUNTER — Ambulatory Visit: Admission: RE | Admit: 2021-12-12 | Payer: Medicaid Other | Source: Ambulatory Visit | Admitting: Radiation Oncology

## 2021-12-12 DIAGNOSIS — C189 Malignant neoplasm of colon, unspecified: Secondary | ICD-10-CM

## 2021-12-20 MED FILL — Dexamethasone Sodium Phosphate Inj 100 MG/10ML: INTRAMUSCULAR | Qty: 1 | Status: AC

## 2021-12-21 ENCOUNTER — Telehealth: Payer: Self-pay | Admitting: *Deleted

## 2021-12-21 ENCOUNTER — Inpatient Hospital Stay (HOSPITAL_BASED_OUTPATIENT_CLINIC_OR_DEPARTMENT_OTHER): Payer: Medicaid Other | Admitting: Oncology

## 2021-12-21 ENCOUNTER — Encounter: Payer: Self-pay | Admitting: Oncology

## 2021-12-21 ENCOUNTER — Inpatient Hospital Stay: Payer: Medicaid Other

## 2021-12-21 VITALS — BP 96/69 | HR 144 | Temp 98.5°F | Resp 16 | Wt 179.6 lb

## 2021-12-21 DIAGNOSIS — Z79899 Other long term (current) drug therapy: Secondary | ICD-10-CM | POA: Diagnosis not present

## 2021-12-21 DIAGNOSIS — F17210 Nicotine dependence, cigarettes, uncomplicated: Secondary | ICD-10-CM | POA: Insufficient documentation

## 2021-12-21 DIAGNOSIS — D6959 Other secondary thrombocytopenia: Secondary | ICD-10-CM | POA: Insufficient documentation

## 2021-12-21 DIAGNOSIS — Z8051 Family history of malignant neoplasm of kidney: Secondary | ICD-10-CM | POA: Insufficient documentation

## 2021-12-21 DIAGNOSIS — D649 Anemia, unspecified: Secondary | ICD-10-CM | POA: Insufficient documentation

## 2021-12-21 DIAGNOSIS — R7989 Other specified abnormal findings of blood chemistry: Secondary | ICD-10-CM | POA: Insufficient documentation

## 2021-12-21 DIAGNOSIS — F319 Bipolar disorder, unspecified: Secondary | ICD-10-CM | POA: Diagnosis not present

## 2021-12-21 DIAGNOSIS — Z5111 Encounter for antineoplastic chemotherapy: Secondary | ICD-10-CM | POA: Insufficient documentation

## 2021-12-21 DIAGNOSIS — C779 Secondary and unspecified malignant neoplasm of lymph node, unspecified: Secondary | ICD-10-CM | POA: Insufficient documentation

## 2021-12-21 DIAGNOSIS — C189 Malignant neoplasm of colon, unspecified: Secondary | ICD-10-CM

## 2021-12-21 DIAGNOSIS — Z803 Family history of malignant neoplasm of breast: Secondary | ICD-10-CM | POA: Insufficient documentation

## 2021-12-21 DIAGNOSIS — C184 Malignant neoplasm of transverse colon: Secondary | ICD-10-CM | POA: Insufficient documentation

## 2021-12-21 LAB — COMPREHENSIVE METABOLIC PANEL
ALT: 42 U/L (ref 0–44)
AST: 42 U/L — ABNORMAL HIGH (ref 15–41)
Albumin: 4 g/dL (ref 3.5–5.0)
Alkaline Phosphatase: 62 U/L (ref 38–126)
Anion gap: 5 (ref 5–15)
BUN: 14 mg/dL (ref 6–20)
CO2: 29 mmol/L (ref 22–32)
Calcium: 9.5 mg/dL (ref 8.9–10.3)
Chloride: 105 mmol/L (ref 98–111)
Creatinine, Ser: 0.92 mg/dL (ref 0.61–1.24)
GFR, Estimated: >60 mL/min (ref >60)
Glucose, Bld: 99 mg/dL (ref 70–99)
Potassium: 4 mmol/L (ref 3.5–5.1)
Sodium: 139 mmol/L (ref 135–145)
Total Bilirubin: 0.7 mg/dL (ref 0.3–1.2)
Total Protein: 6.8 g/dL (ref 6.5–8.1)

## 2021-12-21 LAB — CBC WITH DIFFERENTIAL/PLATELET
Abs Immature Granulocytes: 0.02 10*3/uL (ref 0.00–0.07)
Basophils Absolute: 0 10*3/uL (ref 0.0–0.1)
Basophils Relative: 1 %
Eosinophils Absolute: 0 10*3/uL (ref 0.0–0.5)
Eosinophils Relative: 1 %
HCT: 39 % (ref 39.0–52.0)
Hemoglobin: 13.6 g/dL (ref 13.0–17.0)
Immature Granulocytes: 1 %
Lymphocytes Relative: 30 %
Lymphs Abs: 1.2 10*3/uL (ref 0.7–4.0)
MCH: 33.5 pg (ref 26.0–34.0)
MCHC: 34.9 g/dL (ref 30.0–36.0)
MCV: 96.1 fL (ref 80.0–100.0)
Monocytes Absolute: 0.6 10*3/uL (ref 0.1–1.0)
Monocytes Relative: 15 %
Neutro Abs: 2.1 10*3/uL (ref 1.7–7.7)
Neutrophils Relative %: 52 %
Platelets: 135 10*3/uL — ABNORMAL LOW (ref 150–400)
RBC: 4.06 MIL/uL — ABNORMAL LOW (ref 4.22–5.81)
RDW: 15.6 % — ABNORMAL HIGH (ref 11.5–15.5)
WBC: 3.9 10*3/uL — ABNORMAL LOW (ref 4.0–10.5)
nRBC: 0 % (ref 0.0–0.2)

## 2021-12-21 MED ORDER — PALONOSETRON HCL INJECTION 0.25 MG/5ML
0.2500 mg | Freq: Once | INTRAVENOUS | Status: AC
  Administered 2021-12-21: 0.25 mg via INTRAVENOUS
  Filled 2021-12-21: qty 5

## 2021-12-21 MED ORDER — FLUOROURACIL CHEMO INJECTION 2.5 GM/50ML
400.0000 mg/m2 | Freq: Once | INTRAVENOUS | Status: AC
  Administered 2021-12-21: 800 mg via INTRAVENOUS
  Filled 2021-12-21: qty 16

## 2021-12-21 MED ORDER — OXALIPLATIN CHEMO INJECTION 100 MG/20ML
85.0000 mg/m2 | Freq: Once | INTRAVENOUS | Status: AC
  Administered 2021-12-21: 170 mg via INTRAVENOUS
  Filled 2021-12-21: qty 34

## 2021-12-21 MED ORDER — SODIUM CHLORIDE 0.9 % IV SOLN
10.0000 mg | Freq: Once | INTRAVENOUS | Status: AC
  Administered 2021-12-21: 10 mg via INTRAVENOUS
  Filled 2021-12-21: qty 10

## 2021-12-21 MED ORDER — LEUCOVORIN CALCIUM INJECTION 350 MG
800.0000 mg | Freq: Once | INTRAVENOUS | Status: AC
  Administered 2021-12-21: 800 mg via INTRAVENOUS
  Filled 2021-12-21: qty 40

## 2021-12-21 MED ORDER — SODIUM CHLORIDE 0.9 % IV SOLN
2400.0000 mg/m2 | INTRAVENOUS | Status: DC
  Administered 2021-12-21: 4850 mg via INTRAVENOUS
  Filled 2021-12-21: qty 97

## 2021-12-21 MED ORDER — DEXTROSE 5 % IV SOLN
Freq: Once | INTRAVENOUS | Status: AC
  Filled 2021-12-21: qty 250

## 2021-12-21 NOTE — Telephone Encounter (Signed)
Lorry had asked me to call and make sure that asst living will bring him to the appt. For consult radiation. I called and spoke to [facility] white and told him that he has an appt for radiation consult and wanted to make sure that pt. has transportation for that. [place] says yes . I also told him that he will get print out with the appts also so we can make that he will be here.

## 2021-12-21 NOTE — Progress Notes (Signed)
Hematology/Oncology Consult note [facility]  [phone] [phone]  Patient Care Team: [name], MD as PCP - General (Internal Medicine) Sindy Guadeloupe, MD as Consulting Physician (Hematology and Oncology) Jules Husbands, MD as Consulting Physician (General Surgery) Lin Landsman, MD as Consulting Physician (Gastroenterology) Clent Jacks, RN as Oncology Nurse Navigator   Name of the patient: Connor Wilkinson  [id number]  01/05/1986   Date of visit: 12/21/21  Diagnosis- stage IIIc adenocarcinoma of the colon [id number]    Chief complaint/ Reason for visit-on treatment assessment prior to cycle 11 of adjuvant FOLFOX chemotherapy  Heme/Onc history: patient is a 36 year old male with a past medical history significant for mild bipolar disorder who is a ward of the stateAnd was admitted to the hospital for severe anemia.  He was found to have a hemoglobin of 4.5/18.7 on admission.  He received blood transfusion as well as IV iron and his hemoglobin is presently up to an 8.  He underwent colonoscopy which showed malignant partially obstructing tumor that was circumferential 80 cm proximal to the anus.  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  CT chest abdomen and pelvis with contrast did not show any evidence of distant metastatic disease.  2 small sclerotic densities in the acetabulum and proximal right femur likely benign process.  Pathologically enlarged lymph nodes superior to circumferential colonic lesion suggesting metastatic adenopathy.   Patient underwent right hemicolectomy with Dr. Dahlia Byes on 07/12/2021.  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  Vermiform appendix negative for tumor.  Radial margin positive for tumor (2 lymph nodes  at margin with adenocarcinoma).  Proximal and distal margins negative.  7 out of 56 lymph nodes positive for metastatic adenocarcinoma.  Tumor deposits not applicable.  No lymphovascular or perineural invasion seen.  [id number]   Fertility conservation was also discussed an option of sperm banking at [facility] was discussed with the patient and his healthcare power of attorney [name].  There would be a one-time cost involved for sperm banking and a yearly cost for preserving the specimen.  Overall chances of infertility with FOLFOX is low but possible.  Patient and his healthcare power of attorney have discussed their options and have decided not to proceed with fertility conservation at this time   Plan is for 12 cycles of adjuvant FOLFOX followed by consideration for radiation given T4 lesion    Interval history-doing well on chemotherapy without any significant side effects  ECOG PS- 0 Pain scale- 0   Review of systems- Review of Systems  Constitutional:  Negative for chills, fever, malaise/fatigue and weight loss.  HENT:  Negative for congestion, ear discharge and nosebleeds.   Eyes:  Negative for blurred vision.  Respiratory:  Negative for cough, hemoptysis, sputum production, shortness of breath and wheezing.   Cardiovascular:  Negative for chest pain, palpitations, orthopnea and claudication.  Gastrointestinal:  Negative for abdominal pain, blood in stool, constipation, diarrhea, heartburn, melena, nausea and vomiting.  Genitourinary:  Negative for dysuria, flank pain, frequency, hematuria and urgency.  Musculoskeletal:  Negative for back pain, joint pain and myalgias.  Skin:  Negative for rash.  Neurological:  Negative for dizziness, tingling, focal weakness, seizures, weakness and headaches.  Endo/Heme/Allergies:  Does not bruise/bleed easily.  Psychiatric/Behavioral:  Negative for depression and suicidal ideas. The patient does not have insomnia.  Allergies  Allergen Reactions    Geodon [Ziprasidone Hcl] Anaphylaxis     Past Medical History:  Diagnosis Date   Bipolar 1 disorder ([place])    Colon cancer ([place])    PTSD (post-traumatic stress disorder)      Past Surgical History:  Procedure Laterality Date   COLON RESECTION N/A 07/12/2021   Procedure: HAND ASSISTED LAPAROSCOPIC COLON RESECTION;  Surgeon: Jules Husbands, MD;  Location: [facility] ORS;  Service: General;  Laterality: N/A;   COLONOSCOPY WITH PROPOFOL N/A 07/06/2021   Procedure: COLONOSCOPY WITH PROPOFOL;  Surgeon: Lin Landsman, MD;  Location: [facility] ENDOSCOPY;  Service: Gastroenterology;  Laterality: N/A;   COLONOSCOPY WITH PROPOFOL N/A 07/07/2021   Procedure: COLONOSCOPY WITH PROPOFOL;  Surgeon: Lin Landsman, MD;  Location: [facility] ENDOSCOPY;  Service: Gastroenterology;  Laterality: N/A;   ESOPHAGOGASTRODUODENOSCOPY (EGD) WITH PROPOFOL N/A 07/06/2021   Procedure: ESOPHAGOGASTRODUODENOSCOPY (EGD) WITH PROPOFOL;  Surgeon: Lin Landsman, MD;  Location: [facility] ENDOSCOPY;  Service: Gastroenterology;  Laterality: N/A;   GIVENS CAPSULE STUDY N/A 07/06/2021   Procedure: GIVENS CAPSULE STUDY;  Surgeon: Lin Landsman, MD;  Location: [facility] ENDOSCOPY;  Service: Gastroenterology;  Laterality: N/A;   PORTACATH PLACEMENT Right 07/22/2021   Procedure: INSERTION PORT-A-CATH;  Surgeon: Jules Husbands, MD;  Location: [facility] ORS;  Service: General;  Laterality: Right;    Social History   Socioeconomic History   Marital status: Single    Spouse name: Not on file   Number of children: Not on file   Years of education: Not on file   Highest education level: Not on file  Occupational History   Not on file  Tobacco Use   Smoking status: Every Day    Packs/day: 0.25    Types: Cigarettes   Smokeless tobacco: Never   Tobacco comments:    1 cig a 5 days  Vaping Use   Vaping Use: Every day   Devices: elfbar  Substance and Sexual Activity   Alcohol use: Not Currently   Drug use: Not Currently   Sexual  activity: Yes  Other Topics Concern   Not on file  Social History Narrative   Not on file   Social Determinants of Health   Financial Resource Strain: Not on file  Food Insecurity: Not on file  Transportation Needs: Not on file  Physical Activity: Not on file  Stress: Not on file  Social Connections: Not on file  Intimate Partner Violence: Not on file    Family History  Problem Relation Age of Onset   Kidney cancer Father    Breast cancer Maternal Aunt      Current Outpatient Medications:    ARIPiprazole ER (ABILIFY MAINTENA) 400 MG PRSY prefilled syringe, Inject 400 mg into the muscle every 21 ( twenty-one) days., Disp: , Rfl:    benztropine (COGENTIN) 2 MG tablet, Take 2 mg by mouth at bedtime., Disp: , Rfl:    busPIRone (BUSPAR) 10 MG tablet, Take 10 mg by mouth 2 (two) times daily., Disp: , Rfl:    dexamethasone (DECADRON) 4 MG tablet, TAKE 2 TABLETS ('8MG'$ ) BY MOUTH ONCE DAILY *START THE DAY AFTER CHEMOTHERAPY FOR 2 DAYS. TAKE WITH FOOD., Disp: 30 tablet, Rfl: 10   FLUoxetine (PROZAC) 20 MG capsule, Take 20 mg by mouth daily., Disp: , Rfl:    ibuprofen (ADVIL) 600 MG tablet, Take 1 tablet (600 mg total) by mouth every 6 (six) hours as needed., Disp: 30 tablet, Rfl: 0   polyethylene glycol (MIRALAX / GLYCOLAX) 17 g  packet, Take 17 g by mouth daily as needed (constipation)., Disp: 14 each, Rfl: 0   prazosin (MINIPRESS) 2 MG capsule, Take 2 mg by mouth 2 (two) times daily., Disp: , Rfl:    oxyCODONE (OXY IR/ROXICODONE) 5 MG immediate release tablet, Take 1 tablet (5 mg total) by mouth every 6 (six) hours as needed for severe pain or breakthrough pain. (Patient not taking: Reported on 08/17/2021), Disp: 25 tablet, Rfl: 0 No current facility-administered medications for this visit.  Facility-Administered Medications Ordered in Other Visits:    fluorouracil (ADRUCIL) 4,850 mg in sodium chloride 0.9 % 53 mL chemo infusion, 2,400 mg/m2 (Treatment Plan Recorded), Intravenous, 1 day or 1  dose, Sindy Guadeloupe, MD, Infusion Verify at 12/21/21 1516  Physical exam:  Vitals:   12/21/21 1050 12/21/21 1140  BP: 96/69   Pulse: (!) 144 (!) 144  Resp: 16   Temp: 98.5 F (36.9 C)   SpO2: 99%   Weight: 179 lb 9.6 oz (81.5 kg)    Physical Exam Cardiovascular:     Rate and Rhythm: Normal rate and regular rhythm.     Heart sounds: Normal heart sounds.  Pulmonary:     Effort: Pulmonary effort is normal.     Breath sounds: Normal breath sounds.  Skin:    General: Skin is warm and dry.  Neurological:     Mental Status: He is alert and oriented to person, place, and time.         Latest Ref Rng & Units 12/21/2021   10:26 AM  CMP  Glucose 70 - 99 mg/dL 99   BUN 6 - 20 mg/dL 14   Creatinine 0.61 - 1.24 mg/dL 0.92   Sodium 135 - 145 mmol/L 139   Potassium 3.5 - 5.1 mmol/L 4.0   Chloride 98 - 111 mmol/L 105   CO2 22 - 32 mmol/L 29   Calcium 8.9 - 10.3 mg/dL 9.5   Total Protein 6.5 - 8.1 g/dL 6.8   Total Bilirubin 0.3 - 1.2 mg/dL 0.7   Alkaline Phos 38 - 126 U/L 62   AST 15 - 41 U/L 42   ALT 0 - 44 U/L 42       Latest Ref Rng & Units 12/21/2021   10:26 AM  CBC  WBC 4.0 - 10.5 K/uL 3.9   Hemoglobin 13.0 - 17.0 g/dL 13.6   Hematocrit 39.0 - 52.0 % 39.0   Platelets 150 - 400 K/uL 135      Assessment and plan- Patient is a 36 y.o. male with stage III colon cancer here for on treatment assessment prior to cycle 11 of adjuvant FOLFOX chemotherapy  Counts okay to proceed with cycle 11 of adjuvant FOLFOX chemotherapy today.  I will see him back in 2 weeks for cycle 12 which will be his last cycle.  I am also referring the patient for consideration of adjuvant radiation therapy given that he has a T4 lesion.   Visit Diagnosis 1. Encounter for antineoplastic chemotherapy   2. Adenocarcinoma of colon [facility])      Dr. Randa Evens, MD, MPH [facility] [phone] 12/21/2021 5:14 PM

## 2021-12-23 ENCOUNTER — Inpatient Hospital Stay: Payer: Medicaid Other

## 2021-12-23 VITALS — BP 123/78 | HR 78 | Resp 18

## 2021-12-23 DIAGNOSIS — Z5111 Encounter for antineoplastic chemotherapy: Secondary | ICD-10-CM | POA: Diagnosis not present

## 2021-12-23 DIAGNOSIS — C189 Malignant neoplasm of colon, unspecified: Secondary | ICD-10-CM

## 2021-12-23 MED ORDER — SODIUM CHLORIDE 0.9% FLUSH
10.0000 mL | INTRAVENOUS | Status: DC | PRN
  Administered 2021-12-23: 10 mL
  Filled 2021-12-23: qty 10

## 2021-12-23 MED ORDER — HEPARIN SOD (PORK) LOCK FLUSH 100 UNIT/ML IV SOLN
500.0000 [IU] | Freq: Once | INTRAVENOUS | Status: AC | PRN
  Administered 2021-12-23: 500 [IU]
  Filled 2021-12-23: qty 5

## 2021-12-26 ENCOUNTER — Ambulatory Visit: Payer: Medicaid Other | Admitting: Radiation Oncology

## 2021-12-29 ENCOUNTER — Encounter: Payer: Self-pay | Admitting: Internal Medicine

## 2022-01-03 MED FILL — Dexamethasone Sodium Phosphate Inj 100 MG/10ML: INTRAMUSCULAR | Qty: 1 | Status: AC

## 2022-01-04 ENCOUNTER — Inpatient Hospital Stay (HOSPITAL_BASED_OUTPATIENT_CLINIC_OR_DEPARTMENT_OTHER): Payer: Medicaid Other | Admitting: Oncology

## 2022-01-04 ENCOUNTER — Inpatient Hospital Stay: Payer: Medicaid Other

## 2022-01-04 ENCOUNTER — Encounter: Payer: Self-pay | Admitting: Oncology

## 2022-01-04 ENCOUNTER — Ambulatory Visit
Admission: RE | Admit: 2022-01-04 | Discharge: 2022-01-04 | Disposition: A | Discharge status: Home / Self Care | Payer: Medicaid Other | Source: Ambulatory Visit | Attending: Radiation Oncology | Admitting: Radiation Oncology

## 2022-01-04 VITALS — BP 110/75 | HR 92 | Temp 97.8°F | Resp 18 | Wt 180.6 lb

## 2022-01-04 VITALS — BP 135/73 | HR 90 | Resp 18

## 2022-01-04 DIAGNOSIS — T451X5A Adverse effect of antineoplastic and immunosuppressive drugs, initial encounter: Secondary | ICD-10-CM | POA: Diagnosis not present

## 2022-01-04 DIAGNOSIS — D6959 Other secondary thrombocytopenia: Secondary | ICD-10-CM

## 2022-01-04 DIAGNOSIS — F17210 Nicotine dependence, cigarettes, uncomplicated: Secondary | ICD-10-CM

## 2022-01-04 DIAGNOSIS — Z5111 Encounter for antineoplastic chemotherapy: Secondary | ICD-10-CM | POA: Diagnosis not present

## 2022-01-04 DIAGNOSIS — C189 Malignant neoplasm of colon, unspecified: Secondary | ICD-10-CM

## 2022-01-04 DIAGNOSIS — C182 Malignant neoplasm of ascending colon: Secondary | ICD-10-CM | POA: Diagnosis not present

## 2022-01-04 DIAGNOSIS — R7989 Other specified abnormal findings of blood chemistry: Secondary | ICD-10-CM

## 2022-01-04 LAB — COMPREHENSIVE METABOLIC PANEL
ALT: 73 U/L — ABNORMAL HIGH (ref 0–44)
AST: 64 U/L — ABNORMAL HIGH (ref 15–41)
Albumin: 4.2 g/dL (ref 3.5–5.0)
Alkaline Phosphatase: 65 U/L (ref 38–126)
Anion gap: 5 (ref 5–15)
BUN: 13 mg/dL (ref 6–20)
CO2: 25 mmol/L (ref 22–32)
Calcium: 9.2 mg/dL (ref 8.9–10.3)
Chloride: 109 mmol/L (ref 98–111)
Creatinine, Ser: 0.96 mg/dL (ref 0.61–1.24)
GFR, Estimated: >60 mL/min (ref >60)
Glucose, Bld: 72 mg/dL (ref 70–99)
Potassium: 4.2 mmol/L (ref 3.5–5.1)
Sodium: 139 mmol/L (ref 135–145)
Total Bilirubin: 0.8 mg/dL (ref 0.3–1.2)
Total Protein: 6.9 g/dL (ref 6.5–8.1)

## 2022-01-04 LAB — CBC WITH DIFFERENTIAL/PLATELET
Abs Immature Granulocytes: 0.01 10*3/uL (ref 0.00–0.07)
Basophils Absolute: 0 10*3/uL (ref 0.0–0.1)
Basophils Relative: 1 %
Eosinophils Absolute: 0 10*3/uL (ref 0.0–0.5)
Eosinophils Relative: 1 %
HCT: 39.9 % (ref 39.0–52.0)
Hemoglobin: 13.4 g/dL (ref 13.0–17.0)
Immature Granulocytes: 0 %
Lymphocytes Relative: 35 %
Lymphs Abs: 1.5 10*3/uL (ref 0.7–4.0)
MCH: 33.4 pg (ref 26.0–34.0)
MCHC: 33.6 g/dL (ref 30.0–36.0)
MCV: 99.5 fL (ref 80.0–100.0)
Monocytes Absolute: 0.7 10*3/uL (ref 0.1–1.0)
Monocytes Relative: 16 %
Neutro Abs: 2.1 10*3/uL (ref 1.7–7.7)
Neutrophils Relative %: 47 %
Platelets: 111 10*3/uL — ABNORMAL LOW (ref 150–400)
RBC: 4.01 MIL/uL — ABNORMAL LOW (ref 4.22–5.81)
RDW: 14.6 % (ref 11.5–15.5)
WBC: 4.4 10*3/uL (ref 4.0–10.5)
nRBC: 0 % (ref 0.0–0.2)

## 2022-01-04 MED ORDER — OXALIPLATIN CHEMO INJECTION 100 MG/20ML
85.0000 mg/m2 | Freq: Once | INTRAVENOUS | Status: AC
  Administered 2022-01-04: 170 mg via INTRAVENOUS
  Filled 2022-01-04: qty 34

## 2022-01-04 MED ORDER — SODIUM CHLORIDE 0.9 % IV SOLN
10.0000 mg | Freq: Once | INTRAVENOUS | Status: AC
  Administered 2022-01-04: 10 mg via INTRAVENOUS
  Filled 2022-01-04: qty 10

## 2022-01-04 MED ORDER — DEXTROSE 5 % IV SOLN
Freq: Once | INTRAVENOUS | Status: AC
  Filled 2022-01-04: qty 250

## 2022-01-04 MED ORDER — PALONOSETRON HCL INJECTION 0.25 MG/5ML
0.2500 mg | Freq: Once | INTRAVENOUS | Status: AC
  Administered 2022-01-04: 0.25 mg via INTRAVENOUS
  Filled 2022-01-04: qty 5

## 2022-01-04 MED ORDER — SODIUM CHLORIDE 0.9% FLUSH
10.0000 mL | INTRAVENOUS | Status: DC | PRN
  Filled 2022-01-04: qty 10

## 2022-01-04 MED ORDER — SODIUM CHLORIDE 0.9 % IV SOLN
2400.0000 mg/m2 | INTRAVENOUS | Status: DC
  Administered 2022-01-04: 4850 mg via INTRAVENOUS
  Filled 2022-01-04: qty 97

## 2022-01-04 MED ORDER — FLUOROURACIL CHEMO INJECTION 2.5 GM/50ML
400.0000 mg/m2 | Freq: Once | INTRAVENOUS | Status: AC
  Administered 2022-01-04: 800 mg via INTRAVENOUS
  Filled 2022-01-04: qty 16

## 2022-01-04 MED ORDER — LEUCOVORIN CALCIUM INJECTION 350 MG
800.0000 mg | Freq: Once | INTRAVENOUS | Status: AC
  Administered 2022-01-04: 800 mg via INTRAVENOUS
  Filled 2022-01-04: qty 40

## 2022-01-04 NOTE — Patient Instructions (Signed)
[facility]  Discharge Instructions: Thank you for choosing [facility] to provide your oncology and hematology care.  If you have a lab appointment with the Cancer Center, please go directly to the Cancer Center and check in at the registration area.  Wear comfortable clothing and clothing appropriate for easy access to any Portacath or PICC line.   We strive to give you quality time with your provider. You may need to reschedule your appointment if you arrive late (15 or more minutes).  Arriving late affects you and other patients whose appointments are after yours.  Also, if you miss three or more appointments without notifying the office, you may be dismissed from the clinic at the provider's discretion.      For prescription refill requests, have your pharmacy contact our office and allow 72 hours for refills to be completed.    Today you received the following chemotherapy and/or immunotherapy agents- Oxaliplatin, leucovorin, 5FU      To help prevent nausea and vomiting after your treatment, we encourage you to take your nausea medication as directed.  BELOW ARE SYMPTOMS THAT SHOULD BE REPORTED IMMEDIATELY: *FEVER GREATER THAN 100.4 F (38 C) OR HIGHER *CHILLS OR SWEATING *NAUSEA AND VOMITING THAT IS NOT CONTROLLED WITH YOUR NAUSEA MEDICATION *UNUSUAL SHORTNESS OF BREATH *UNUSUAL BRUISING OR BLEEDING *URINARY PROBLEMS (pain or burning when urinating, or frequent urination) *BOWEL PROBLEMS (unusual diarrhea, constipation, pain near the anus) TENDERNESS IN MOUTH AND THROAT WITH OR WITHOUT PRESENCE OF ULCERS (sore throat, sores in mouth, or a toothache) UNUSUAL RASH, SWELLING OR PAIN  UNUSUAL VAGINAL DISCHARGE OR ITCHING   Items with * indicate a potential emergency and should be followed up as soon as possible or go to the Emergency Department if any problems should occur.  Please show the CHEMOTHERAPY ALERT CARD or IMMUNOTHERAPY ALERT  CARD at check-in to the Emergency Department and triage nurse.  Should you have questions after your visit or need to cancel or reschedule your appointment, please contact [facility]  [phone] and follow the prompts.  Office hours are 8:00 a.m. to 4:30 p.m. Monday - Friday. Please note that voicemails left after 4:00 p.m. may not be returned until the following business day.  We are closed weekends and major holidays. You have access to a nurse at all times for urgent questions. Please call the main number to the clinic [phone] and follow the prompts.  For any non-urgent questions, you may also contact your provider using MyChart. We now offer e-Visits for anyone 18 and older to request care online for non-urgent symptoms. For details visit [email or web address].   Also download the MyChart app! Go to the app store, search "MyChart", open the app, select , and log in with your MyChart username and password.  Masks are optional in the cancer centers. If you would like for your care team to wear a mask while they are taking care of you, please let them know. For doctor visits, patients may have with them one support person who is at least 34 years old. At this time, visitors are not allowed in the infusion area.  

## 2022-01-04 NOTE — Consult Note (Signed)
NEW PATIENT EVALUATION  Name: Connor Wilkinson  MRN: [id number]  Date:   01/04/2022     DOB: 1986-01-12   This 36 y.o. male patient presents to the clinic for initial evaluation of stage IIIc (PT4AN2BM0) adenocarcinoma of the transverse colon status post resection and adjuvant FOLFOX chemotherapy.  REFERRING PHYSICIAN: [name], MD  CHIEF COMPLAINT:  Chief Complaint  Patient presents with   New Patient (Initial Visit)    DIAGNOSIS: The encounter diagnosis was Adenocarcinoma of colon ([place]).   PREVIOUS INVESTIGATIONS:  CT scans reviewed Pathology report reviewed Clinical notes reviewed  HPI: Patient is a 36 year old male with mild bipolar disorder who presented with severe anemia found to have a hemoglobin of 4.5 on admission.  Colonoscopy revealed a obstructing tumor in the transverse colon 80 cm proximal to the anus.  Biopsy was positive for moderately differentiated adenocarcinoma with mucinous features.  CT scan showed circumferential soft tissue mass lesion in the proximal transverse colon measuring 6.2 cm consistent with history of colon cancer.  There were pathologically enlarged lymph nodes immediately superior to the circumferential colonic lesion.  Patient then went transverse colon resection showing invasive mucinous adenocarcinoma with tumor invading into the pericolonic adipose tissue and focally to the serosal surface.  Mesenteric radial margin positive for tumor to lymph nodes and margin with metastatic adenocarcinoma.  Tumor measured 11.5 cm.  7 of 56 lymph nodes were positive for metastatic disease.  Patient is undergone FOLFOX chemotherapy will complete that this week.  He had 12 cycles of treatment.  He is seen today for consideration of adjuvant radiation therapy he is doing well he states his bowel function is pretty much normal at this time no abdominal pain or discomfort.  PLANNED TREATMENT REGIMEN: Local regional adjuvant radiation therapy to region of the  transverse ascending colon  PAST MEDICAL HISTORY:  has a past medical history of Bipolar 1 disorder ([place]), Colon cancer ([place]), and PTSD (post-traumatic stress disorder).    PAST SURGICAL HISTORY:  Past Surgical History:  Procedure Laterality Date   COLON RESECTION N/A 07/12/2021   Procedure: HAND ASSISTED LAPAROSCOPIC COLON RESECTION;  Surgeon: Jules Husbands, MD;  Location: [facility] ORS;  Service: General;  Laterality: N/A;   COLONOSCOPY WITH PROPOFOL N/A 07/06/2021   Procedure: COLONOSCOPY WITH PROPOFOL;  Surgeon: Lin Landsman, MD;  Location: [facility] ENDOSCOPY;  Service: Gastroenterology;  Laterality: N/A;   COLONOSCOPY WITH PROPOFOL N/A 07/07/2021   Procedure: COLONOSCOPY WITH PROPOFOL;  Surgeon: Lin Landsman, MD;  Location[facility] ENDOSCOPY;  Service: Gastroenterology;  Laterality: N/A;   ESOPHAGOGASTRODUODENOSCOPY (EGD) WITH PROPOFOL N/A 07/06/2021   Procedure: ESOPHAGOGASTRODUODENOSCOPY (EGD) WITH PROPOFOL;  Surgeon: Lin Landsman, MD;  Location: [facility] ENDOSCOPY;  Service: Gastroenterology;  Laterality: N/A;   GIVENS CAPSULE STUDY N/A 07/06/2021   Procedure: GIVENS CAPSULE STUDY;  Surgeon: Lin Landsman, MD;  Location: [facility] ENDOSCOPY;  Service: Gastroenterology;  Laterality: N/A;   PORTACATH PLACEMENT Right 07/22/2021   Procedure: INSERTION PORT-A-CATH;  Surgeon: Jules Husbands, MD;  Location: [facility] ORS;  Service: General;  Laterality: Right;    FAMILY HISTORY: family history includes Breast cancer in his maternal aunt; Kidney cancer in his father.  SOCIAL HISTORY:  reports that he has been smoking cigarettes. He has been smoking an average of .25 packs per day. He has never used smokeless tobacco. He reports that he does not currently use alcohol. He reports that he does not currently use drugs.  ALLERGIES: Geodon [ziprasidone hcl]  MEDICATIONS:  Current Outpatient Medications  Medication Sig Dispense Refill   ARIPiprazole ER (ABILIFY MAINTENA) 400 MG PRSY prefilled  syringe Inject 400 mg into the muscle every 21 ( twenty-one) days.     benztropine (COGENTIN) 2 MG tablet Take 2 mg by mouth at bedtime.     busPIRone (BUSPAR) 10 MG tablet Take 10 mg by mouth 2 (two) times daily.     dexamethasone (DECADRON) 4 MG tablet TAKE 2 TABLETS ('8MG'$ ) BY MOUTH ONCE DAILY *START THE DAY AFTER CHEMOTHERAPY FOR 2 DAYS. TAKE WITH FOOD. 30 tablet 10   FLUoxetine (PROZAC) 20 MG capsule Take 20 mg by mouth daily.     ibuprofen (ADVIL) 600 MG tablet Take 1 tablet (600 mg total) by mouth every 6 (six) hours as needed. 30 tablet 0   oxyCODONE (OXY IR/ROXICODONE) 5 MG immediate release tablet Take 1 tablet (5 mg total) by mouth every 6 (six) hours as needed for severe pain or breakthrough pain. (Patient not taking: Reported on 08/17/2021) 25 tablet 0   polyethylene glycol (MIRALAX / GLYCOLAX) 17 g packet Take 17 g by mouth daily as needed (constipation). 14 each 0   prazosin (MINIPRESS) 2 MG capsule Take 2 mg by mouth 2 (two) times daily.     No current facility-administered medications for this encounter.    ECOG PERFORMANCE STATUS:  0 - Asymptomatic  REVIEW OF SYSTEMS: Patient denies any weight loss, fatigue, weakness, fever, chills or night sweats. Patient denies any loss of vision, blurred vision. Patient denies any ringing  of the ears or hearing loss. No irregular heartbeat. Patient denies heart murmur or history of fainting. Patient denies any chest pain or pain radiating to her upper extremities. Patient denies any shortness of breath, difficulty breathing at night, cough or hemoptysis. Patient denies any swelling in the lower legs. Patient denies any nausea vomiting, vomiting of blood, or coffee ground material in the vomitus. Patient denies any stomach pain. Patient states has had normal bowel movements no significant constipation or diarrhea. Patient denies any dysuria, hematuria or significant nocturia. Patient denies any problems walking, swelling in the joints or loss of  balance. Patient denies any skin changes, loss of hair or loss of weight. Patient denies any excessive worrying or anxiety or significant depression. Patient denies any problems with insomnia. Patient denies excessive thirst, polyuria, polydipsia. Patient denies any swollen glands, patient denies easy bruising or easy bleeding. Patient denies any recent infections, allergies or URI. Patient "s visual fields have not changed significantly in recent time.   PHYSICAL EXAM: BP 135/73 (BP Location: Left Arm, Patient Position: Sitting)   Pulse 90   Resp 18   SpO2 100%  Well-developed well-nourished patient in NAD. HEENT reveals PERLA, EOMI, discs not visualized.  Oral cavity is clear. No oral mucosal lesions are identified. Neck is clear without evidence of cervical or supraclavicular adenopathy. Lungs are clear to A&P. Cardiac examination is essentially unremarkable with regular rate and rhythm without murmur rub or thrill. Abdomen is benign with no organomegaly or masses noted. Motor sensory and DTR levels are equal and symmetric in the upper and lower extremities. Cranial nerves II through XII are grossly intact. Proprioception is intact. No peripheral adenopathy or edema is identified. No motor or sensory levels are noted. Crude visual fields are within normal range.  LABORATORY DATA: Pathology reports reviewed    RADIOLOGY RESULTS: CT scan initially reviewed compatible with above-stated findings   IMPRESSION: Stage IIIc adenocarcinoma the transverse ascending colon status post resection and adjuvant FOLFOX chemotherapy and 36 year old  male  PLAN: At this time of recommended adjuvant radiation therapy based on his initial CT presentation of disease.  We treat that region with 45 Gray in 25 fractions.  Risks and benefits of treatment occluding possible diarrhea fatigue alteration of blood count skin reaction all were discussed in detail with the patient.  He comprehends her treatment plan well.  I will  allow about 2 weeks to let him regain some of his strength from his chemotherapy and have ordered CT simulation at that time.  Again will use his original CT scan for treatment planning purposes.  Patient comprehends my recommendations well.  I would like to take this opportunity to thank you for allowing me to participate in the care of your patient.Noreene Filbert, MD

## 2022-01-04 NOTE — Progress Notes (Signed)
Hematology/Oncology Consult note [facility]  [phone] [phone]  Patient Care Team: [name], MD as PCP - General (Internal Medicine) Sindy Guadeloupe, MD as Consulting Physician (Hematology and Oncology) Jules Husbands, MD as Consulting Physician (General Surgery) Lin Landsman, MD as Consulting Physician (Gastroenterology) Clent Jacks, RN as Oncology Nurse Navigator   Name of the patient: Connor Wilkinson  [id number]  06/06/1985   Date of visit: 01/04/22  Diagnosis- stage IIIc adenocarcinoma of the colon [id number]  Chief complaint/ Reason for visit-on treatment assessment prior to cycle 12 of adjuvant FOLFOX chemotherapy  Heme/Onc history: patient is a 36 year old male with a past medical history significant for mild bipolar disorder who is a ward of the stateAnd was admitted to the hospital for severe anemia.  He was found to have a hemoglobin of 4.5/18.7 on admission.  He received blood transfusion as well as IV iron and his hemoglobin is presently up to an 8.  He underwent colonoscopy which showed malignant partially obstructing tumor that was circumferential 80 cm proximal to the anus.  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  CT chest abdomen and pelvis with contrast did not show any evidence of distant metastatic disease.  2 small sclerotic densities in the acetabulum and proximal right femur likely benign process.  Pathologically enlarged lymph nodes superior to circumferential colonic lesion suggesting metastatic adenopathy.   Patient underwent right hemicolectomy with Dr. Dahlia Byes on 07/12/2021.  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  Vermiform appendix negative for tumor.  Radial margin positive for tumor (2 lymph nodes at  margin with adenocarcinoma).  Proximal and distal margins negative.  7 out of 56 lymph nodes positive for metastatic adenocarcinoma.  Tumor deposits not applicable.  No lymphovascular or perineural invasion seen.  [id number]   Fertility conservation was also discussed an option of sperm banking at [facility] was discussed with the patient and his healthcare power of attorney [name].  There would be a one-time cost involved for sperm banking and a yearly cost for preserving the specimen.  Overall chances of infertility with FOLFOX is low but possible.  Patient and his healthcare power of attorney have discussed their options and have decided not to proceed with fertility conservation at this time   Plan is for 12 cycles of adjuvant FOLFOX followed by consideration for radiation given T4 lesion  Interval history-tolerating chemotherapy well and denies any specific complaints at this time he is not sure if he wants to pursue radiation but is willing to meet with Dr. Donella Stade.  ECOG PS- 0 Pain scale- 0   Review of systems- Review of Systems  Constitutional:  Negative for chills, fever, malaise/fatigue and weight loss.  HENT:  Negative for congestion, ear discharge and nosebleeds.   Eyes:  Negative for blurred vision.  Respiratory:  Negative for cough, hemoptysis, sputum production, shortness of breath and wheezing.   Cardiovascular:  Negative for chest pain, palpitations, orthopnea and claudication.  Gastrointestinal:  Negative for abdominal pain, blood in stool, constipation, diarrhea, heartburn, melena, nausea and vomiting.  Genitourinary:  Negative for dysuria, flank pain, frequency, hematuria and urgency.  Musculoskeletal:  Negative for back pain, joint pain and myalgias.  Skin:  Negative for rash.  Neurological:  Negative for dizziness, tingling, focal weakness, seizures, weakness and headaches.  Endo/Heme/Allergies:  Does not bruise/bleed easily.  Psychiatric/Behavioral:  Negative for depression  and suicidal ideas. The patient does not have insomnia.       Allergies  Allergen Reactions   Geodon [Ziprasidone Hcl] Anaphylaxis     Past Medical History:  Diagnosis Date   Bipolar 1 disorder ([place])    Colon cancer ([place])    PTSD (post-traumatic stress disorder)      Past Surgical History:  Procedure Laterality Date   COLON RESECTION N/A 07/12/2021   Procedure: HAND ASSISTED LAPAROSCOPIC COLON RESECTION;  Surgeon: Jules Husbands, MD;  Location: [facility] ORS;  Service: General;  Laterality: N/A;   COLONOSCOPY WITH PROPOFOL N/A 07/06/2021   Procedure: COLONOSCOPY WITH PROPOFOL;  Surgeon: Lin Landsman, MD;  Location: [facility] ENDOSCOPY;  Service: Gastroenterology;  Laterality: N/A;   COLONOSCOPY WITH PROPOFOL N/A 07/07/2021   Procedure: COLONOSCOPY WITH PROPOFOL;  Surgeon: Lin Landsman, MD;  Location: [facility] ENDOSCOPY;  Service: Gastroenterology;  Laterality: N/A;   ESOPHAGOGASTRODUODENOSCOPY (EGD) WITH PROPOFOL N/A 07/06/2021   Procedure: ESOPHAGOGASTRODUODENOSCOPY (EGD) WITH PROPOFOL;  Surgeon: Lin Landsman, MD;  Location: [facility] ENDOSCOPY;  Service: Gastroenterology;  Laterality: N/A;   GIVENS CAPSULE STUDY N/A 07/06/2021   Procedure: GIVENS CAPSULE STUDY;  Surgeon: Lin Landsman, MD;  Location: [facility] ENDOSCOPY;  Service: Gastroenterology;  Laterality: N/A;   PORTACATH PLACEMENT Right 07/22/2021   Procedure: INSERTION PORT-A-CATH;  Surgeon: Jules Husbands, MD;  Location: [facility] ORS;  Service: General;  Laterality: Right;    Social History   Socioeconomic History   Marital status: Single    Spouse name: Not on file   Number of children: Not on file   Years of education: Not on file   Highest education level: Not on file  Occupational History   Not on file  Tobacco Use   Smoking status: Every Day    Packs/day: 0.25    Types: Cigarettes   Smokeless tobacco: Never   Tobacco comments:    1 cig a 5 days  Vaping Use   Vaping Use: Every day   Devices: elfbar   Substance and Sexual Activity   Alcohol use: Not Currently   Drug use: Not Currently   Sexual activity: Yes  Other Topics Concern   Not on file  Social History Narrative   Not on file   Social Determinants of Health   Financial Resource Strain: Not on file  Food Insecurity: Not on file  Transportation Needs: Not on file  Physical Activity: Not on file  Stress: Not on file  Social Connections: Not on file  Intimate Partner Violence: Not on file    Family History  Problem Relation Age of Onset   Kidney cancer Father    Breast cancer Maternal Aunt      Current Outpatient Medications:    ARIPiprazole ER (ABILIFY MAINTENA) 400 MG PRSY prefilled syringe, Inject 400 mg into the muscle every 21 ( twenty-one) days., Disp: , Rfl:    benztropine (COGENTIN) 2 MG tablet, Take 2 mg by mouth at bedtime., Disp: , Rfl:    busPIRone (BUSPAR) 10 MG tablet, Take 10 mg by mouth 2 (two) times daily., Disp: , Rfl:    dexamethasone (DECADRON) 4 MG tablet, TAKE 2 TABLETS ('8MG'$ ) BY MOUTH ONCE DAILY *START THE DAY AFTER CHEMOTHERAPY FOR 2 DAYS. TAKE WITH FOOD., Disp: 30 tablet, Rfl: 10   FLUoxetine (PROZAC) 20 MG capsule, Take 20 mg by mouth daily., Disp: , Rfl:    ibuprofen (ADVIL) 600 MG tablet, Take 1 tablet (600 mg total) by mouth  every 6 (six) hours as needed., Disp: 30 tablet, Rfl: 0   polyethylene glycol (MIRALAX / GLYCOLAX) 17 g packet, Take 17 g by mouth daily as needed (constipation)., Disp: 14 each, Rfl: 0   prazosin (MINIPRESS) 2 MG capsule, Take 2 mg by mouth 2 (two) times daily., Disp: , Rfl:    oxyCODONE (OXY IR/ROXICODONE) 5 MG immediate release tablet, Take 1 tablet (5 mg total) by mouth every 6 (six) hours as needed for severe pain or breakthrough pain. (Patient not taking: Reported on 08/17/2021), Disp: 25 tablet, Rfl: 0 No current facility-administered medications for this visit.  Facility-Administered Medications Ordered in Other Visits:    dexamethasone (DECADRON) 10 mg in sodium  chloride 0.9 % 50 mL IVPB, 10 mg, Intravenous, Once, Sindy Guadeloupe, MD   dextrose 5 % solution, , Intravenous, Once, Sindy Guadeloupe, MD   fluorouracil (ADRUCIL) 4,850 mg in sodium chloride 0.9 % 53 mL chemo infusion, 2,400 mg/m2 (Treatment Plan Recorded), Intravenous, 1 day or 1 dose, Sindy Guadeloupe, MD   fluorouracil (ADRUCIL) chemo injection 800 mg, 400 mg/m2 (Treatment Plan Recorded), Intravenous, Once, Sindy Guadeloupe, MD   leucovorin 808 mg in dextrose 5 % 250 mL infusion, 400 mg/m2 (Treatment Plan Recorded), Intravenous, Once, Sindy Guadeloupe, MD   oxaliplatin (ELOXATIN) 170 mg in dextrose 5 % 500 mL chemo infusion, 85 mg/m2 (Treatment Plan Recorded), Intravenous, Once, Sindy Guadeloupe, MD   palonosetron (ALOXI) injection 0.25 mg, 0.25 mg, Intravenous, Once, Sindy Guadeloupe, MD   sodium chloride flush (NS) 0.9 % injection 10 mL, 10 mL, Intracatheter, PRN, Sindy Guadeloupe, MD  Physical exam:  Vitals:   01/04/22 0824  BP: 110/75  Pulse: 92  Resp: 18  Temp: 97.8 F (36.6 C)  SpO2: 100%  Weight: 180 lb 9.6 oz (81.9 kg)   Physical Exam Constitutional:      General: He is not in acute distress. Cardiovascular:     Rate and Rhythm: Normal rate and regular rhythm.     Heart sounds: Normal heart sounds.  Pulmonary:     Effort: Pulmonary effort is normal.     Breath sounds: Normal breath sounds.  Skin:    General: Skin is warm and dry.  Neurological:     Mental Status: He is alert and oriented to person, place, and time.         Latest Ref Rng & Units 01/04/2022    8:08 AM  CMP  Glucose 70 - 99 mg/dL 72   BUN 6 - 20 mg/dL 13   Creatinine 0.61 - 1.24 mg/dL 0.96   Sodium 135 - 145 mmol/L 139   Potassium 3.5 - 5.1 mmol/L 4.2   Chloride 98 - 111 mmol/L 109   CO2 22 - 32 mmol/L 25   Calcium 8.9 - 10.3 mg/dL 9.2   Total Protein 6.5 - 8.1 g/dL 6.9   Total Bilirubin 0.3 - 1.2 mg/dL 0.8   Alkaline Phos 38 - 126 U/L 65   AST 15 - 41 U/L 64   ALT 0 - 44 U/L 73       Latest Ref Rng  & Units 01/04/2022    8:08 AM  CBC  WBC 4.0 - 10.5 K/uL 4.4   Hemoglobin 13.0 - 17.0 g/dL 13.4   Hematocrit 39.0 - 52.0 % 39.9   Platelets 150 - 400 K/uL 111      Assessment and plan- Patient is a 36 y.o. male with history of stage III colon cancer  here for on treatment assessment prior to cycle 12 of adjuvant FOLFOX chemotherapy  Counts okay to proceed with cycle 12 of adjuvant FOLFOX chemotherapy today which would be his last cycle.  He will see radiation oncology at this time to see if he wants to proceed with adjuvant radiation therapy given T4 disease.  I will plan to repeat a CT chest abdomen pelvis with contrast at this time.  Port can be taken out.  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  I will reach out to Dr. Marius Ditch to see if her repeat colonoscopy is indicated at this time or if he can wait for 1 year postsurgery   Visit Diagnosis 1. Encounter for antineoplastic chemotherapy   2. Adenocarcinoma of colon [facility])      Dr. Randa Evens, MD, MPH [facility] [phone] 01/04/2022 9:17 AM

## 2022-01-05 ENCOUNTER — Other Ambulatory Visit: Payer: Self-pay

## 2022-01-06 ENCOUNTER — Inpatient Hospital Stay: Payer: Medicaid Other

## 2022-01-06 VITALS — BP 123/67 | HR 88 | Resp 18

## 2022-01-06 DIAGNOSIS — C189 Malignant neoplasm of colon, unspecified: Secondary | ICD-10-CM

## 2022-01-06 DIAGNOSIS — Z5111 Encounter for antineoplastic chemotherapy: Secondary | ICD-10-CM | POA: Diagnosis not present

## 2022-01-06 MED ORDER — HEPARIN SOD (PORK) LOCK FLUSH 100 UNIT/ML IV SOLN
500.0000 [IU] | Freq: Once | INTRAVENOUS | Status: AC | PRN
  Administered 2022-01-06: 500 [IU]
  Filled 2022-01-06: qty 5

## 2022-01-06 MED ORDER — SODIUM CHLORIDE 0.9% FLUSH
10.0000 mL | INTRAVENOUS | Status: DC | PRN
  Administered 2022-01-06: 10 mL
  Filled 2022-01-06: qty 10

## 2022-01-11 ENCOUNTER — Other Ambulatory Visit: Payer: Self-pay

## 2022-01-11 DIAGNOSIS — C189 Malignant neoplasm of colon, unspecified: Secondary | ICD-10-CM

## 2022-01-12 ENCOUNTER — Other Ambulatory Visit: Payer: Self-pay

## 2022-01-12 DIAGNOSIS — C189 Malignant neoplasm of colon, unspecified: Secondary | ICD-10-CM

## 2022-01-16 ENCOUNTER — Ambulatory Visit
Admission: RE | Admit: 2022-01-16 | Discharge: 2022-01-16 | Disposition: A | Discharge status: Home / Self Care | Payer: Medicaid Other | Source: Ambulatory Visit | Attending: Oncology | Admitting: Oncology

## 2022-01-16 ENCOUNTER — Other Ambulatory Visit: Payer: Self-pay

## 2022-01-16 DIAGNOSIS — C189 Malignant neoplasm of colon, unspecified: Secondary | ICD-10-CM | POA: Diagnosis present

## 2022-01-16 MED ORDER — IOHEXOL 300 MG/ML  SOLN
100.0000 mL | Freq: Once | INTRAMUSCULAR | Status: AC | PRN
  Administered 2022-01-16: 100 mL via INTRAVENOUS

## 2022-01-18 ENCOUNTER — Ambulatory Visit
Admission: RE | Admit: 2022-01-18 | Discharge: 2022-01-18 | Disposition: A | Discharge status: Home / Self Care | Payer: Medicaid Other | Source: Ambulatory Visit | Attending: Radiation Oncology | Admitting: Radiation Oncology

## 2022-01-18 ENCOUNTER — Ambulatory Visit: Admission: RE | Admit: 2022-01-18 | Payer: Medicaid Other | Source: Ambulatory Visit

## 2022-01-18 ENCOUNTER — Encounter: Payer: Self-pay | Admitting: *Deleted

## 2022-01-18 DIAGNOSIS — D649 Anemia, unspecified: Secondary | ICD-10-CM | POA: Diagnosis not present

## 2022-01-18 DIAGNOSIS — R7989 Other specified abnormal findings of blood chemistry: Secondary | ICD-10-CM | POA: Diagnosis not present

## 2022-01-18 DIAGNOSIS — Z8051 Family history of malignant neoplasm of kidney: Secondary | ICD-10-CM | POA: Diagnosis not present

## 2022-01-18 DIAGNOSIS — C779 Secondary and unspecified malignant neoplasm of lymph node, unspecified: Secondary | ICD-10-CM | POA: Diagnosis not present

## 2022-01-18 DIAGNOSIS — Z510 Encounter for antineoplastic radiation therapy: Secondary | ICD-10-CM | POA: Insufficient documentation

## 2022-01-18 DIAGNOSIS — F319 Bipolar disorder, unspecified: Secondary | ICD-10-CM | POA: Diagnosis not present

## 2022-01-18 DIAGNOSIS — F17210 Nicotine dependence, cigarettes, uncomplicated: Secondary | ICD-10-CM | POA: Insufficient documentation

## 2022-01-18 DIAGNOSIS — Z79899 Other long term (current) drug therapy: Secondary | ICD-10-CM | POA: Diagnosis not present

## 2022-01-18 DIAGNOSIS — Z5111 Encounter for antineoplastic chemotherapy: Secondary | ICD-10-CM | POA: Insufficient documentation

## 2022-01-18 DIAGNOSIS — Z803 Family history of malignant neoplasm of breast: Secondary | ICD-10-CM | POA: Insufficient documentation

## 2022-01-18 DIAGNOSIS — C184 Malignant neoplasm of transverse colon: Secondary | ICD-10-CM | POA: Insufficient documentation

## 2022-01-18 DIAGNOSIS — D6959 Other secondary thrombocytopenia: Secondary | ICD-10-CM | POA: Insufficient documentation

## 2022-01-19 ENCOUNTER — Other Ambulatory Visit: Payer: Self-pay

## 2022-01-20 ENCOUNTER — Inpatient Hospital Stay: Payer: Medicaid Other

## 2022-01-20 ENCOUNTER — Inpatient Hospital Stay: Payer: Medicaid Other | Admitting: Oncology

## 2022-01-20 ENCOUNTER — Other Ambulatory Visit: Payer: Self-pay | Admitting: *Deleted

## 2022-01-20 ENCOUNTER — Telehealth: Payer: Self-pay | Admitting: Oncology

## 2022-01-20 DIAGNOSIS — C182 Malignant neoplasm of ascending colon: Secondary | ICD-10-CM | POA: Insufficient documentation

## 2022-01-20 DIAGNOSIS — C189 Malignant neoplasm of colon, unspecified: Secondary | ICD-10-CM

## 2022-01-20 DIAGNOSIS — C184 Malignant neoplasm of transverse colon: Secondary | ICD-10-CM | POA: Insufficient documentation

## 2022-01-20 NOTE — Telephone Encounter (Signed)
Attempt made to reach [facility] in regards to patient's missed appointment this morning. No answer and mailbox is full. Will send Mychart message as well.

## 2022-01-24 ENCOUNTER — Ambulatory Visit: Payer: Medicaid Other

## 2022-01-24 DIAGNOSIS — Z510 Encounter for antineoplastic radiation therapy: Secondary | ICD-10-CM | POA: Diagnosis not present

## 2022-01-25 ENCOUNTER — Ambulatory Visit: Payer: Medicaid Other

## 2022-01-26 ENCOUNTER — Ambulatory Visit: Admission: RE | Admit: 2022-01-26 | Payer: Medicaid Other | Source: Ambulatory Visit

## 2022-01-26 ENCOUNTER — Telehealth: Payer: Self-pay

## 2022-01-26 ENCOUNTER — Other Ambulatory Visit: Payer: Self-pay

## 2022-01-26 DIAGNOSIS — Z510 Encounter for antineoplastic radiation therapy: Secondary | ICD-10-CM | POA: Diagnosis not present

## 2022-01-26 NOTE — Telephone Encounter (Signed)
Reached out to Dr. Corlis Leak office regarding the status of port removal. Per front desk, the referral coordinator has tried to reach pt and has been unable to. Pt was actually downstairs in [place] so I informed him that Dr. Barb Merino office has been trying to reach out. Provided the pt with their office referal coordinator phone number in order for him to reach out and have the appt scheduled. Pt understands and agrees.

## 2022-01-30 ENCOUNTER — Ambulatory Visit
Admission: RE | Admit: 2022-01-30 | Discharge: 2022-01-30 | Disposition: A | Discharge status: Home / Self Care | Payer: Medicaid Other | Source: Ambulatory Visit | Attending: Radiation Oncology | Admitting: Radiation Oncology

## 2022-01-30 ENCOUNTER — Other Ambulatory Visit: Payer: Self-pay

## 2022-01-30 DIAGNOSIS — Z510 Encounter for antineoplastic radiation therapy: Secondary | ICD-10-CM | POA: Diagnosis not present

## 2022-01-30 LAB — RAD ONC ARIA SESSION SUMMARY
Course Elapsed Days: 0
Plan Fractions Treated to Date: 1
Plan Prescribed Dose Per Fraction: 1.8 Gy
Plan Total Fractions Prescribed: 25
Plan Total Prescribed Dose: 45 Gy
Reference Point Dosage Given to Date: 1.8 Gy
Reference Point Session Dosage Given: 1.8 Gy
Session Number: 1

## 2022-01-31 ENCOUNTER — Ambulatory Visit: Payer: Medicaid Other

## 2022-01-31 ENCOUNTER — Ambulatory Visit
Admission: RE | Admit: 2022-01-31 | Discharge: 2022-01-31 | Disposition: A | Discharge status: Home / Self Care | Payer: Medicaid Other | Source: Ambulatory Visit | Attending: Radiation Oncology | Admitting: Radiation Oncology

## 2022-01-31 ENCOUNTER — Other Ambulatory Visit: Payer: Self-pay

## 2022-01-31 DIAGNOSIS — Z510 Encounter for antineoplastic radiation therapy: Secondary | ICD-10-CM | POA: Diagnosis not present

## 2022-01-31 LAB — RAD ONC ARIA SESSION SUMMARY
Course Elapsed Days: 1
Plan Fractions Treated to Date: 2
Plan Prescribed Dose Per Fraction: 1.8 Gy
Plan Total Fractions Prescribed: 25
Plan Total Prescribed Dose: 45 Gy
Reference Point Dosage Given to Date: 3.6 Gy
Reference Point Session Dosage Given: 1.8 Gy
Session Number: 2

## 2022-02-01 ENCOUNTER — Ambulatory Visit
Admission: RE | Admit: 2022-02-01 | Discharge: 2022-02-01 | Disposition: A | Discharge status: Home / Self Care | Payer: Medicaid Other | Source: Ambulatory Visit | Attending: Radiation Oncology | Admitting: Radiation Oncology

## 2022-02-01 ENCOUNTER — Other Ambulatory Visit: Payer: Self-pay

## 2022-02-01 DIAGNOSIS — Z510 Encounter for antineoplastic radiation therapy: Secondary | ICD-10-CM | POA: Diagnosis not present

## 2022-02-01 LAB — RAD ONC ARIA SESSION SUMMARY
Course Elapsed Days: 2
Plan Fractions Treated to Date: 3
Plan Prescribed Dose Per Fraction: 1.8 Gy
Plan Total Fractions Prescribed: 25
Plan Total Prescribed Dose: 45 Gy
Reference Point Dosage Given to Date: 5.4 Gy
Reference Point Session Dosage Given: 1.8 Gy
Session Number: 3

## 2022-02-02 ENCOUNTER — Other Ambulatory Visit: Payer: Self-pay

## 2022-02-02 ENCOUNTER — Ambulatory Visit
Admission: RE | Admit: 2022-02-02 | Discharge: 2022-02-02 | Disposition: A | Discharge status: Home / Self Care | Payer: Medicaid Other | Source: Ambulatory Visit | Attending: Radiation Oncology | Admitting: Radiation Oncology

## 2022-02-02 ENCOUNTER — Inpatient Hospital Stay: Payer: Medicaid Other

## 2022-02-02 DIAGNOSIS — C189 Malignant neoplasm of colon, unspecified: Secondary | ICD-10-CM

## 2022-02-02 DIAGNOSIS — Z510 Encounter for antineoplastic radiation therapy: Secondary | ICD-10-CM | POA: Diagnosis not present

## 2022-02-02 LAB — RAD ONC ARIA SESSION SUMMARY
Course Elapsed Days: 3
Plan Fractions Treated to Date: 4
Plan Prescribed Dose Per Fraction: 1.8 Gy
Plan Total Fractions Prescribed: 25
Plan Total Prescribed Dose: 45 Gy
Reference Point Dosage Given to Date: 7.2 Gy
Reference Point Session Dosage Given: 1.8 Gy
Session Number: 4

## 2022-02-02 LAB — CBC
HCT: 40.8 % (ref 39.0–52.0)
Hemoglobin: 13.8 g/dL (ref 13.0–17.0)
MCH: 34.2 pg — ABNORMAL HIGH (ref 26.0–34.0)
MCHC: 33.8 g/dL (ref 30.0–36.0)
MCV: 101 fL — ABNORMAL HIGH (ref 80.0–100.0)
Platelets: 187 10*3/uL (ref 150–400)
RBC: 4.04 MIL/uL — ABNORMAL LOW (ref 4.22–5.81)
RDW: 12.5 % (ref 11.5–15.5)
WBC: 6.4 10*3/uL (ref 4.0–10.5)
nRBC: 0 % (ref 0.0–0.2)

## 2022-02-03 ENCOUNTER — Other Ambulatory Visit: Payer: Self-pay

## 2022-02-03 ENCOUNTER — Ambulatory Visit: Admission: RE | Admit: 2022-02-03 | Payer: Medicaid Other | Source: Ambulatory Visit

## 2022-02-03 ENCOUNTER — Ambulatory Visit
Admission: RE | Admit: 2022-02-03 | Discharge: 2022-02-03 | Disposition: A | Discharge status: Home / Self Care | Payer: Medicaid Other | Source: Ambulatory Visit | Attending: Radiation Oncology | Admitting: Radiation Oncology

## 2022-02-03 DIAGNOSIS — Z510 Encounter for antineoplastic radiation therapy: Secondary | ICD-10-CM | POA: Diagnosis not present

## 2022-02-03 LAB — RAD ONC ARIA SESSION SUMMARY
Course Elapsed Days: 4
Plan Fractions Treated to Date: 5
Plan Prescribed Dose Per Fraction: 1.8 Gy
Plan Total Fractions Prescribed: 25
Plan Total Prescribed Dose: 45 Gy
Reference Point Dosage Given to Date: 9 Gy
Reference Point Session Dosage Given: 1.8 Gy
Session Number: 5

## 2022-02-06 ENCOUNTER — Ambulatory Visit
Admission: RE | Admit: 2022-02-06 | Discharge: 2022-02-06 | Disposition: A | Discharge status: Home / Self Care | Payer: Medicaid Other | Source: Ambulatory Visit | Attending: Radiation Oncology | Admitting: Radiation Oncology

## 2022-02-06 ENCOUNTER — Other Ambulatory Visit: Payer: Self-pay

## 2022-02-06 DIAGNOSIS — Z510 Encounter for antineoplastic radiation therapy: Secondary | ICD-10-CM | POA: Diagnosis not present

## 2022-02-06 LAB — RAD ONC ARIA SESSION SUMMARY
Course Elapsed Days: 7
Plan Fractions Treated to Date: 1
Plan Prescribed Dose Per Fraction: 1.8 Gy
Plan Total Fractions Prescribed: 20
Plan Total Prescribed Dose: 36 Gy
Reference Point Dosage Given to Date: 10.8 Gy
Reference Point Session Dosage Given: 1.8 Gy
Session Number: 6

## 2022-02-07 ENCOUNTER — Ambulatory Visit
Admission: RE | Admit: 2022-02-07 | Discharge: 2022-02-07 | Disposition: A | Discharge status: Home / Self Care | Payer: Medicaid Other | Source: Ambulatory Visit | Attending: Radiation Oncology | Admitting: Radiation Oncology

## 2022-02-07 ENCOUNTER — Other Ambulatory Visit: Payer: Self-pay

## 2022-02-07 DIAGNOSIS — Z510 Encounter for antineoplastic radiation therapy: Secondary | ICD-10-CM | POA: Diagnosis not present

## 2022-02-07 LAB — RAD ONC ARIA SESSION SUMMARY
Course Elapsed Days: 8
Plan Fractions Treated to Date: 2
Plan Prescribed Dose Per Fraction: 1.8 Gy
Plan Total Fractions Prescribed: 20
Plan Total Prescribed Dose: 36 Gy
Reference Point Dosage Given to Date: 12.6 Gy
Reference Point Session Dosage Given: 1.8 Gy
Session Number: 7

## 2022-02-08 ENCOUNTER — Ambulatory Visit
Admission: RE | Admit: 2022-02-08 | Discharge: 2022-02-08 | Disposition: A | Discharge status: Home / Self Care | Payer: Medicaid Other | Source: Ambulatory Visit | Attending: Radiation Oncology | Admitting: Radiation Oncology

## 2022-02-08 ENCOUNTER — Other Ambulatory Visit: Payer: Self-pay

## 2022-02-08 DIAGNOSIS — Z510 Encounter for antineoplastic radiation therapy: Secondary | ICD-10-CM | POA: Diagnosis not present

## 2022-02-08 LAB — RAD ONC ARIA SESSION SUMMARY
Course Elapsed Days: 9
Plan Fractions Treated to Date: 3
Plan Prescribed Dose Per Fraction: 1.8 Gy
Plan Total Fractions Prescribed: 20
Plan Total Prescribed Dose: 36 Gy
Reference Point Dosage Given to Date: 14.4 Gy
Reference Point Session Dosage Given: 1.8 Gy
Session Number: 8

## 2022-02-09 ENCOUNTER — Other Ambulatory Visit: Payer: Self-pay

## 2022-02-09 ENCOUNTER — Inpatient Hospital Stay: Payer: Medicaid Other

## 2022-02-09 ENCOUNTER — Ambulatory Visit
Admission: RE | Admit: 2022-02-09 | Discharge: 2022-02-09 | Disposition: A | Discharge status: Home / Self Care | Payer: Medicaid Other | Source: Ambulatory Visit | Attending: Radiation Oncology | Admitting: Radiation Oncology

## 2022-02-09 DIAGNOSIS — Z510 Encounter for antineoplastic radiation therapy: Secondary | ICD-10-CM | POA: Diagnosis not present

## 2022-02-09 LAB — RAD ONC ARIA SESSION SUMMARY
Course Elapsed Days: 10
Plan Fractions Treated to Date: 4
Plan Prescribed Dose Per Fraction: 1.8 Gy
Plan Total Fractions Prescribed: 20
Plan Total Prescribed Dose: 36 Gy
Reference Point Dosage Given to Date: 16.2 Gy
Reference Point Session Dosage Given: 1.8 Gy
Session Number: 9

## 2022-02-10 ENCOUNTER — Other Ambulatory Visit: Payer: Self-pay

## 2022-02-10 ENCOUNTER — Ambulatory Visit
Admission: RE | Admit: 2022-02-10 | Discharge: 2022-02-10 | Disposition: A | Discharge status: Home / Self Care | Payer: Medicaid Other | Source: Ambulatory Visit | Attending: Radiation Oncology | Admitting: Radiation Oncology

## 2022-02-10 DIAGNOSIS — Z510 Encounter for antineoplastic radiation therapy: Secondary | ICD-10-CM | POA: Diagnosis not present

## 2022-02-10 LAB — RAD ONC ARIA SESSION SUMMARY
Course Elapsed Days: 11
Plan Fractions Treated to Date: 5
Plan Prescribed Dose Per Fraction: 1.8 Gy
Plan Total Fractions Prescribed: 20
Plan Total Prescribed Dose: 36 Gy
Reference Point Dosage Given to Date: 18 Gy
Reference Point Session Dosage Given: 1.8 Gy
Session Number: 10

## 2022-02-13 ENCOUNTER — Ambulatory Visit
Admission: RE | Admit: 2022-02-13 | Discharge: 2022-02-13 | Disposition: A | Discharge status: Home / Self Care | Payer: Medicaid Other | Source: Ambulatory Visit | Attending: Radiation Oncology | Admitting: Radiation Oncology

## 2022-02-13 ENCOUNTER — Other Ambulatory Visit: Payer: Self-pay

## 2022-02-13 DIAGNOSIS — Z510 Encounter for antineoplastic radiation therapy: Secondary | ICD-10-CM | POA: Diagnosis not present

## 2022-02-13 LAB — RAD ONC ARIA SESSION SUMMARY
Course Elapsed Days: 14
Plan Fractions Treated to Date: 6
Plan Prescribed Dose Per Fraction: 1.8 Gy
Plan Total Fractions Prescribed: 20
Plan Total Prescribed Dose: 36 Gy
Reference Point Dosage Given to Date: 19.8 Gy
Reference Point Session Dosage Given: 1.8 Gy
Session Number: 11

## 2022-02-14 ENCOUNTER — Ambulatory Visit
Admission: RE | Admit: 2022-02-14 | Discharge: 2022-02-14 | Disposition: A | Discharge status: Home / Self Care | Payer: Medicaid Other | Source: Ambulatory Visit | Attending: Radiation Oncology | Admitting: Radiation Oncology

## 2022-02-14 ENCOUNTER — Other Ambulatory Visit: Payer: Self-pay | Admitting: Radiology

## 2022-02-14 ENCOUNTER — Other Ambulatory Visit: Payer: Self-pay

## 2022-02-14 DIAGNOSIS — C189 Malignant neoplasm of colon, unspecified: Secondary | ICD-10-CM

## 2022-02-14 DIAGNOSIS — Z510 Encounter for antineoplastic radiation therapy: Secondary | ICD-10-CM | POA: Diagnosis not present

## 2022-02-14 LAB — RAD ONC ARIA SESSION SUMMARY
Course Elapsed Days: 15
Plan Fractions Treated to Date: 7
Plan Prescribed Dose Per Fraction: 1.8 Gy
Plan Total Fractions Prescribed: 20
Plan Total Prescribed Dose: 36 Gy
Reference Point Dosage Given to Date: 21.6 Gy
Reference Point Session Dosage Given: 1.8 Gy
Session Number: 12

## 2022-02-15 ENCOUNTER — Other Ambulatory Visit: Payer: Self-pay

## 2022-02-15 ENCOUNTER — Ambulatory Visit
Admission: RE | Admit: 2022-02-15 | Discharge: 2022-02-15 | Disposition: A | Discharge status: Home / Self Care | Payer: Medicaid Other | Source: Ambulatory Visit | Attending: Radiation Oncology | Admitting: Radiation Oncology

## 2022-02-15 ENCOUNTER — Encounter: Payer: Self-pay | Admitting: Radiology

## 2022-02-15 ENCOUNTER — Ambulatory Visit
Admission: RE | Admit: 2022-02-15 | Discharge: 2022-02-15 | Disposition: A | Discharge status: Home / Self Care | Payer: Medicaid Other | Source: Ambulatory Visit | Attending: Oncology | Admitting: Oncology

## 2022-02-15 DIAGNOSIS — Z5111 Encounter for antineoplastic chemotherapy: Secondary | ICD-10-CM | POA: Diagnosis present

## 2022-02-15 DIAGNOSIS — Z510 Encounter for antineoplastic radiation therapy: Secondary | ICD-10-CM | POA: Diagnosis not present

## 2022-02-15 DIAGNOSIS — Z79899 Other long term (current) drug therapy: Secondary | ICD-10-CM | POA: Diagnosis not present

## 2022-02-15 DIAGNOSIS — R7989 Other specified abnormal findings of blood chemistry: Secondary | ICD-10-CM | POA: Insufficient documentation

## 2022-02-15 DIAGNOSIS — F319 Bipolar disorder, unspecified: Secondary | ICD-10-CM | POA: Insufficient documentation

## 2022-02-15 DIAGNOSIS — F17210 Nicotine dependence, cigarettes, uncomplicated: Secondary | ICD-10-CM | POA: Diagnosis not present

## 2022-02-15 DIAGNOSIS — C184 Malignant neoplasm of transverse colon: Secondary | ICD-10-CM | POA: Insufficient documentation

## 2022-02-15 DIAGNOSIS — C189 Malignant neoplasm of colon, unspecified: Secondary | ICD-10-CM | POA: Insufficient documentation

## 2022-02-15 DIAGNOSIS — Z803 Family history of malignant neoplasm of breast: Secondary | ICD-10-CM | POA: Insufficient documentation

## 2022-02-15 DIAGNOSIS — D6959 Other secondary thrombocytopenia: Secondary | ICD-10-CM | POA: Insufficient documentation

## 2022-02-15 DIAGNOSIS — C779 Secondary and unspecified malignant neoplasm of lymph node, unspecified: Secondary | ICD-10-CM | POA: Diagnosis not present

## 2022-02-15 DIAGNOSIS — D649 Anemia, unspecified: Secondary | ICD-10-CM | POA: Insufficient documentation

## 2022-02-15 DIAGNOSIS — Z8051 Family history of malignant neoplasm of kidney: Secondary | ICD-10-CM | POA: Insufficient documentation

## 2022-02-15 DIAGNOSIS — Z452 Encounter for adjustment and management of vascular access device: Secondary | ICD-10-CM | POA: Diagnosis not present

## 2022-02-15 HISTORY — PX: IR REMOVAL TUN ACCESS W/ PORT W/O FL MOD SED: IMG2290

## 2022-02-15 LAB — RAD ONC ARIA SESSION SUMMARY
Course Elapsed Days: 16
Plan Fractions Treated to Date: 8
Plan Prescribed Dose Per Fraction: 1.8 Gy
Plan Total Fractions Prescribed: 20
Plan Total Prescribed Dose: 36 Gy
Reference Point Dosage Given to Date: 23.4 Gy
Reference Point Session Dosage Given: 1.8 Gy
Session Number: 13

## 2022-02-15 MED ORDER — LIDOCAINE-EPINEPHRINE 1 %-1:100000 IJ SOLN
INTRAMUSCULAR | Status: AC
  Administered 2022-02-15: 4 mL
  Filled 2022-02-15: qty 1

## 2022-02-15 MED ORDER — SODIUM CHLORIDE 0.9 % IV SOLN: INTRAVENOUS | Status: DC

## 2022-02-15 NOTE — Discharge Instructions (Signed)
Implanted Port Removal, Care After Refer to this sheet in the next few weeks. These instructions provide you with information about caring for yourself after your procedure. Your health care provider may also give you more specific instructions. Your treatment has been planned according to current medical practices, but problems sometimes occur. Call your health care provider if you have any problems or questions after your procedure. What can I expect after the procedure? After the procedure, it is common to have: Soreness or pain near your incision. Some swelling or bruising near your incision.  Follow these instructions at home: Medicines Take over-the-counter and prescription medicines only as told by your health care provider. If you were prescribed an antibiotic medicine, take it as told by your health care provider. Do not stop taking the antibiotic even if you start to feel better. Bathing You may shower tomorrow.  Incision care You have an incision with sutures that are dissolvable and skin glue over top incision.  This skin glue will wear off over time.  Do not peel or try to remove this yourself. Keep your dressing dry. Check your incision area every day for signs of infection and if present call your doctor and let them know. Check for: More redness, swelling, or pain. More fluid or blood. Warmth. Pus or a bad smell. Driving If you received a sedative, do not drive for 24 hours after the procedure. If you did not receive a sedative, ask your health care provider when it is safe to drive. Activity Return to your normal activities as told by your health care provider. Ask your health care provider what activities are safe for you. Until your health care provider says it is safe: Do not lift anything that is heavier than 10 lb (4.5 kg). Do not do activities that involve lifting your arms over your head. General instructions Do not use any tobacco products, such as cigarettes,  chewing tobacco, and e-cigarettes. Tobacco can delay healing. If you need help quitting, ask your health care provider. Keep all follow-up visits as told by your health care provider. This is important. Contact a health care provider if: You have more redness, swelling, or pain around your incision. You have more fluid or blood coming from your incision. Your incision feels warm to the touch. You have pus or a bad smell coming from your incision. You have a fever. You have pain that is not relieved by your pain medicine. Get help right away if: You have chest pain. You have difficulty breathing. This information is not intended to replace advice given to you by your health care provider. Make sure you discuss any questions you have with your health care provider. Document Released: 03/15/2015 Document Revised: 09/09/2015 Document Reviewed: 01/06/2015 Elsevier Interactive Patient Education  2018 [employer]vier Inc.  

## 2022-02-15 NOTE — Progress Notes (Signed)
Patient for IR Port Removal on Wed 02/15/22,  I called and spoke with the patient on the phone and gave pre-procedure instructions. Pt was made aware to be here at 11a at the new entrance, NPO after MN prior to procedure as well as driver post procedure/recovery/discharge. Pt stated understanding. I was unable to reach the pt until today, Wed 02/15/22.  The Pt assured me that he had already gotten the instructions that I gave to the [facility] and has been "fasting" since MN.

## 2022-02-16 ENCOUNTER — Ambulatory Visit
Admission: RE | Admit: 2022-02-16 | Discharge: 2022-02-16 | Disposition: A | Discharge status: Home / Self Care | Payer: Medicaid Other | Source: Ambulatory Visit | Attending: Radiation Oncology | Admitting: Radiation Oncology

## 2022-02-16 ENCOUNTER — Inpatient Hospital Stay: Payer: Medicaid Other

## 2022-02-16 ENCOUNTER — Encounter: Payer: Self-pay | Admitting: Internal Medicine

## 2022-02-16 ENCOUNTER — Other Ambulatory Visit: Payer: Self-pay

## 2022-02-16 DIAGNOSIS — C779 Secondary and unspecified malignant neoplasm of lymph node, unspecified: Secondary | ICD-10-CM | POA: Insufficient documentation

## 2022-02-16 DIAGNOSIS — Z8051 Family history of malignant neoplasm of kidney: Secondary | ICD-10-CM | POA: Insufficient documentation

## 2022-02-16 DIAGNOSIS — Z923 Personal history of irradiation: Secondary | ICD-10-CM | POA: Insufficient documentation

## 2022-02-16 DIAGNOSIS — C189 Malignant neoplasm of colon, unspecified: Secondary | ICD-10-CM | POA: Insufficient documentation

## 2022-02-16 DIAGNOSIS — Z803 Family history of malignant neoplasm of breast: Secondary | ICD-10-CM | POA: Insufficient documentation

## 2022-02-16 DIAGNOSIS — Z510 Encounter for antineoplastic radiation therapy: Secondary | ICD-10-CM | POA: Diagnosis not present

## 2022-02-16 DIAGNOSIS — F17210 Nicotine dependence, cigarettes, uncomplicated: Secondary | ICD-10-CM | POA: Insufficient documentation

## 2022-02-16 LAB — RAD ONC ARIA SESSION SUMMARY
Course Elapsed Days: 17
Plan Fractions Treated to Date: 9
Plan Prescribed Dose Per Fraction: 1.8 Gy
Plan Total Fractions Prescribed: 20
Plan Total Prescribed Dose: 36 Gy
Reference Point Dosage Given to Date: 25.2 Gy
Reference Point Session Dosage Given: 1.8 Gy
Session Number: 14

## 2022-02-17 ENCOUNTER — Ambulatory Visit: Payer: Medicaid Other

## 2022-02-17 ENCOUNTER — Emergency Department
Admission: EM | Admit: 2022-02-17 | Discharge: 2022-02-17 | Disposition: A | Discharge status: Home / Self Care | Payer: Medicaid Other | Attending: Emergency Medicine | Admitting: Emergency Medicine

## 2022-02-17 ENCOUNTER — Other Ambulatory Visit: Payer: Self-pay

## 2022-02-17 ENCOUNTER — Emergency Department: Payer: Medicaid Other

## 2022-02-17 ENCOUNTER — Encounter: Payer: Self-pay | Admitting: Emergency Medicine

## 2022-02-17 DIAGNOSIS — R1033 Periumbilical pain: Secondary | ICD-10-CM

## 2022-02-17 DIAGNOSIS — R1013 Epigastric pain: Secondary | ICD-10-CM | POA: Diagnosis present

## 2022-02-17 LAB — CBC
HCT: 45.5 % (ref 39.0–52.0)
Hemoglobin: 15.5 g/dL (ref 13.0–17.0)
MCH: 33.1 pg (ref 26.0–34.0)
MCHC: 34.1 g/dL (ref 30.0–36.0)
MCV: 97.2 fL (ref 80.0–100.0)
Platelets: 199 10*3/uL (ref 150–400)
RBC: 4.68 MIL/uL (ref 4.22–5.81)
RDW: 11.8 % (ref 11.5–15.5)
WBC: 6.1 10*3/uL (ref 4.0–10.5)
nRBC: 0 % (ref 0.0–0.2)

## 2022-02-17 LAB — URINALYSIS, ROUTINE W REFLEX MICROSCOPIC
Bilirubin Urine: NEGATIVE
Glucose, UA: NEGATIVE mg/dL
Hgb urine dipstick: NEGATIVE
Ketones, ur: 5 mg/dL — AB
Leukocytes,Ua: NEGATIVE
Nitrite: NEGATIVE
Protein, ur: NEGATIVE mg/dL
Specific Gravity, Urine: 1.025 (ref 1.005–1.030)
pH: 5 (ref 5.0–8.0)

## 2022-02-17 LAB — COMPREHENSIVE METABOLIC PANEL
ALT: 18 U/L (ref 0–44)
AST: 18 U/L (ref 15–41)
Albumin: 4.4 g/dL (ref 3.5–5.0)
Alkaline Phosphatase: 69 U/L (ref 38–126)
Anion gap: 7 (ref 5–15)
BUN: 12 mg/dL (ref 6–20)
CO2: 24 mmol/L (ref 22–32)
Calcium: 9.3 mg/dL (ref 8.9–10.3)
Chloride: 107 mmol/L (ref 98–111)
Creatinine, Ser: 0.75 mg/dL (ref 0.61–1.24)
GFR, Estimated: >60 mL/min (ref >60)
Glucose, Bld: 104 mg/dL — ABNORMAL HIGH (ref 70–99)
Potassium: 4.1 mmol/L (ref 3.5–5.1)
Sodium: 138 mmol/L (ref 135–145)
Total Bilirubin: 1 mg/dL (ref 0.3–1.2)
Total Protein: 7.6 g/dL (ref 6.5–8.1)

## 2022-02-17 LAB — LIPASE, BLOOD: Lipase: 23 U/L (ref 11–51)

## 2022-02-17 MED ORDER — IOHEXOL 300 MG/ML  SOLN
100.0000 mL | Freq: Once | INTRAMUSCULAR | Status: AC | PRN
  Administered 2022-02-17: 100 mL via INTRAVENOUS

## 2022-02-17 NOTE — ED Provider Notes (Signed)
[facility] Provider Note    Event Date/Time   First MD Initiated Contact with Patient 02/17/22 1546     (approximate)   History   Abdominal Pain   HPI  Connor Wilkinson is a 36 y.o. male   Past medical history of status post chemotherapy last round was over 1 month ago, started on radiation therapy 2 weeks ago, PTSD and bipolar who presents to the emergency department with epigastric abdominal pain for the past several days.  Had nausea with 1 episode of vomiting last night, no blood.  No diarrhea no blood in the stools.  No dysuria or pain in the testicles.  No trauma.  No fever or chills.  He has not had symptoms of abdominal pain with radiation therapy in the past.  Last bowel movement was 2 days ago which was normal.  He has been passing flatus.  History was obtained via the patient and a review of external medical notes including oncology note for radiation therapy on 02/14/2022      Physical Exam   Triage Vital Signs: ED Triage Vitals  Enc Vitals Group     BP 02/17/22 1345 103/83     Pulse Rate 02/17/22 1345 83     Resp 02/17/22 1345 17     Temp 02/17/22 1345 98.3 F (36.8 C)     Temp Source 02/17/22 1345 Oral     SpO2 02/17/22 1345 98 %     Weight --      Height --      Head Circumference --      Peak Flow --      Pain Score 02/17/22 1356 8     Pain Loc --      Pain Edu? --      Excl. in [place]? --     Most recent vital signs: Vitals:   02/17/22 1345 02/17/22 1815  BP: 103/83 110/78  Pulse: 83 78  Resp: 17 16  Temp: 98.3 F (36.8 C) 98.1 F (36.7 C)  SpO2: 98% 99%    General: Awake, no distress.  CV:  Good peripheral perfusion.  Resp:  Normal effort.  Abd:  No distention.  Mild epigastric/periumbilical tenderness to palpation without rigidity or guarding. Other:  The patient declined rectal exam and testicular exam stating that he has no bleeding and no pain in the area.   ED Results / Procedures / Treatments   Labs (all  labs ordered are listed, but only abnormal results are displayed) Labs Reviewed  COMPREHENSIVE METABOLIC PANEL - Abnormal; Notable for the following components:      Result Value   Glucose, Bld 104 (*)    All other components within normal limits  URINALYSIS, ROUTINE W REFLEX MICROSCOPIC - Abnormal; Notable for the following components:   Color, Urine YELLOW (*)    APPearance HAZY (*)    Ketones, ur 5 (*)    All other components within normal limits  LIPASE, BLOOD  CBC     I reviewed labs and they are notable for blood cell count is normal. RADIOLOGY I independently reviewed and interpreted CT of the abdomen, and I see no obvious infectious or obstructive patterns   PROCEDURES:  Critical Care performed: No  Procedures   MEDICATIONS ORDERED IN ED: Medications  iohexol (OMNIPAQUE) 300 MG/ML solution 100 mL (100 mLs Intravenous Contrast Given 02/17/22 1757)     IMPRESSION / MDM / ASSESSMENT AND PLAN / ED COURSE  I reviewed the triage vital  signs and the nursing notes.                              Differential diagnosis includes, but is not limited to, radiation therapy side effects, intra-abdominal infection, obstruction, pancreatitis, cholecystitis, gastroenteritis, gib   MDM: Patient with cancer history started radiation therapy with abdominal pain and nausea vomiting with a relatively benign abdominal exam some mild epigastric tenderness to palpation and no frank obstructive symptoms.  Denies GI bleeding and has a normal H&H and normal hemodynamics in the emergency department.  Overall looks well.  Labs unremarkable including LFTs and lipase, urinalysis is negative for urinary tract infection, and a CT scan of the abdomen pelvis was performed for any surgical or other emergent infectious pathologies of the abdomen.  If negative, will instruct patient to follow-up with oncologist and PMD return with worsening.   Patient's presentation is most consistent with acute  presentation with potential threat to life or bodily function.       FINAL CLINICAL IMPRESSION(S) / ED DIAGNOSES   Final diagnoses:  Periumbilical abdominal pain     Rx / DC Orders   ED Discharge Orders     None        Note:  This document was prepared using Dragon voice recognition software and may include unintentional dictation errors.    Lucillie Garfinkel, MD 02/17/22 2007

## 2022-02-17 NOTE — ED Triage Notes (Signed)
Pt to ED via [facility] from assisted living facility. Pt states that he had radiation yesterday. Pt states that after radiation his stomach started to hurt. Pt reports that he also vomited last night. Pt is currently being treated for colon cancer.

## 2022-02-17 NOTE — ED Notes (Signed)
First Nurse Note: Pt to ED via [facility] from [facility] for abdominal pain since yesterday. Pt just finished chemo for Colon Cancer. Pt had radiation yesterday abd pain started after that. Pt had 1 episodes of vomiting yesterday. Pt reports decreased appetite.

## 2022-02-17 NOTE — Discharge Instructions (Signed)
   Thank you for choosing us for your health care today!  Please see your primary doctor this week for a follow up appointment.   If you do not have a primary doctor call the following clinics to establish care:  If you have insurance:  [facility] [phone] [address] [place] [address]   [facility]  [phone] [address] [place] [address]   If you do not have insurance:  [facility]  [phone] [address] [place] [address]  Sometimes, in the early stages of certain disease courses it is difficult to detect in the emergency department evaluation -- so, it is important that you continue to monitor your symptoms and call your doctor right away or return to the emergency department if you develop any new or worsening symptoms.  It was my pleasure to care for you today.   Marina Desire S. Zalan Shidler, MD  

## 2022-02-18 ENCOUNTER — Other Ambulatory Visit: Payer: Self-pay

## 2022-02-20 ENCOUNTER — Other Ambulatory Visit: Payer: Self-pay

## 2022-02-20 ENCOUNTER — Ambulatory Visit
Admission: RE | Admit: 2022-02-20 | Discharge: 2022-02-20 | Disposition: A | Discharge status: Home / Self Care | Payer: Medicaid Other | Source: Ambulatory Visit | Attending: Radiation Oncology | Admitting: Radiation Oncology

## 2022-02-20 DIAGNOSIS — Z510 Encounter for antineoplastic radiation therapy: Secondary | ICD-10-CM | POA: Diagnosis not present

## 2022-02-20 LAB — RAD ONC ARIA SESSION SUMMARY
Course Elapsed Days: 21
Plan Fractions Treated to Date: 10
Plan Prescribed Dose Per Fraction: 1.8 Gy
Plan Total Fractions Prescribed: 20
Plan Total Prescribed Dose: 36 Gy
Reference Point Dosage Given to Date: 27 Gy
Reference Point Session Dosage Given: 1.8 Gy
Session Number: 15

## 2022-02-21 ENCOUNTER — Encounter: Payer: Self-pay | Admitting: Oncology

## 2022-02-21 ENCOUNTER — Ambulatory Visit
Admission: RE | Admit: 2022-02-21 | Discharge: 2022-02-21 | Disposition: A | Discharge status: Home / Self Care | Payer: Medicaid Other | Source: Ambulatory Visit | Attending: Radiation Oncology | Admitting: Radiation Oncology

## 2022-02-21 ENCOUNTER — Inpatient Hospital Stay: Payer: Medicaid Other

## 2022-02-21 ENCOUNTER — Inpatient Hospital Stay (HOSPITAL_BASED_OUTPATIENT_CLINIC_OR_DEPARTMENT_OTHER): Payer: Medicaid Other | Admitting: Oncology

## 2022-02-21 ENCOUNTER — Other Ambulatory Visit: Payer: Self-pay

## 2022-02-21 VITALS — BP 117/75 | HR 83 | Temp 97.5°F | Resp 16 | Wt 186.3 lb

## 2022-02-21 DIAGNOSIS — Z510 Encounter for antineoplastic radiation therapy: Secondary | ICD-10-CM | POA: Diagnosis not present

## 2022-02-21 DIAGNOSIS — Z85038 Personal history of other malignant neoplasm of large intestine: Secondary | ICD-10-CM

## 2022-02-21 DIAGNOSIS — C189 Malignant neoplasm of colon, unspecified: Secondary | ICD-10-CM

## 2022-02-21 DIAGNOSIS — Z08 Encounter for follow-up examination after completed treatment for malignant neoplasm: Secondary | ICD-10-CM | POA: Diagnosis not present

## 2022-02-21 LAB — CBC WITH DIFFERENTIAL/PLATELET
Abs Immature Granulocytes: 0.01 10*3/uL (ref 0.00–0.07)
Basophils Absolute: 0 10*3/uL (ref 0.0–0.1)
Basophils Relative: 1 %
Eosinophils Absolute: 0.3 10*3/uL (ref 0.0–0.5)
Eosinophils Relative: 7 %
HCT: 39.8 % (ref 39.0–52.0)
Hemoglobin: 13.6 g/dL (ref 13.0–17.0)
Immature Granulocytes: 0 %
Lymphocytes Relative: 20 %
Lymphs Abs: 0.8 10*3/uL (ref 0.7–4.0)
MCH: 33.7 pg (ref 26.0–34.0)
MCHC: 34.2 g/dL (ref 30.0–36.0)
MCV: 98.5 fL (ref 80.0–100.0)
Monocytes Absolute: 0.3 10*3/uL (ref 0.1–1.0)
Monocytes Relative: 8 %
Neutro Abs: 2.6 10*3/uL (ref 1.7–7.7)
Neutrophils Relative %: 64 %
Platelets: 179 10*3/uL (ref 150–400)
RBC: 4.04 MIL/uL — ABNORMAL LOW (ref 4.22–5.81)
RDW: 11.6 % (ref 11.5–15.5)
WBC: 4 10*3/uL (ref 4.0–10.5)
nRBC: 0 % (ref 0.0–0.2)

## 2022-02-21 LAB — RAD ONC ARIA SESSION SUMMARY
Course Elapsed Days: 22
Plan Fractions Treated to Date: 11
Plan Prescribed Dose Per Fraction: 1.8 Gy
Plan Total Fractions Prescribed: 20
Plan Total Prescribed Dose: 36 Gy
Reference Point Dosage Given to Date: 28.8 Gy
Reference Point Session Dosage Given: 1.8 Gy
Session Number: 16

## 2022-02-21 LAB — COMPREHENSIVE METABOLIC PANEL
ALT: 17 U/L (ref 0–44)
AST: 23 U/L (ref 15–41)
Albumin: 4.3 g/dL (ref 3.5–5.0)
Alkaline Phosphatase: 65 U/L (ref 38–126)
Anion gap: 8 (ref 5–15)
BUN: 13 mg/dL (ref 6–20)
CO2: 28 mmol/L (ref 22–32)
Calcium: 9.5 mg/dL (ref 8.9–10.3)
Chloride: 103 mmol/L (ref 98–111)
Creatinine, Ser: 0.84 mg/dL (ref 0.61–1.24)
GFR, Estimated: >60 mL/min (ref >60)
Glucose, Bld: 97 mg/dL (ref 70–99)
Potassium: 4.3 mmol/L (ref 3.5–5.1)
Sodium: 139 mmol/L (ref 135–145)
Total Bilirubin: 0.4 mg/dL (ref 0.3–1.2)
Total Protein: 7.2 g/dL (ref 6.5–8.1)

## 2022-02-21 LAB — IRON AND TIBC
Iron: 78 ug/dL (ref 45–182)
Saturation Ratios: 21 % (ref 17.9–39.5)
TIBC: 368 ug/dL (ref 250–450)
UIBC: 290 ug/dL

## 2022-02-21 LAB — FERRITIN: Ferritin: 141 ng/mL (ref 24–336)

## 2022-02-21 NOTE — Progress Notes (Signed)
Patient denies new problems/concerns today.   °

## 2022-02-22 ENCOUNTER — Ambulatory Visit
Admission: RE | Admit: 2022-02-22 | Discharge: 2022-02-22 | Disposition: A | Discharge status: Home / Self Care | Payer: Medicaid Other | Source: Ambulatory Visit | Attending: Radiation Oncology | Admitting: Radiation Oncology

## 2022-02-22 ENCOUNTER — Other Ambulatory Visit: Payer: Self-pay

## 2022-02-22 DIAGNOSIS — Z510 Encounter for antineoplastic radiation therapy: Secondary | ICD-10-CM | POA: Diagnosis not present

## 2022-02-22 LAB — CEA: CEA: 3.7 ng/mL (ref 0.0–4.7)

## 2022-02-22 LAB — RAD ONC ARIA SESSION SUMMARY
Course Elapsed Days: 23
Plan Fractions Treated to Date: 12
Plan Prescribed Dose Per Fraction: 1.8 Gy
Plan Total Fractions Prescribed: 20
Plan Total Prescribed Dose: 36 Gy
Reference Point Dosage Given to Date: 30.6 Gy
Reference Point Session Dosage Given: 1.8 Gy
Session Number: 17

## 2022-02-23 ENCOUNTER — Ambulatory Visit
Admission: RE | Admit: 2022-02-23 | Discharge: 2022-02-23 | Disposition: A | Discharge status: Home / Self Care | Payer: Medicaid Other | Source: Ambulatory Visit | Attending: Radiation Oncology | Admitting: Radiation Oncology

## 2022-02-23 ENCOUNTER — Inpatient Hospital Stay: Payer: Medicaid Other

## 2022-02-23 ENCOUNTER — Other Ambulatory Visit: Payer: Self-pay

## 2022-02-23 DIAGNOSIS — Z510 Encounter for antineoplastic radiation therapy: Secondary | ICD-10-CM | POA: Diagnosis not present

## 2022-02-23 DIAGNOSIS — C189 Malignant neoplasm of colon, unspecified: Secondary | ICD-10-CM

## 2022-02-23 LAB — CBC
HCT: 42 % (ref 39.0–52.0)
Hemoglobin: 14.1 g/dL (ref 13.0–17.0)
MCH: 33.5 pg (ref 26.0–34.0)
MCHC: 33.6 g/dL (ref 30.0–36.0)
MCV: 99.8 fL (ref 80.0–100.0)
Platelets: 190 10*3/uL (ref 150–400)
RBC: 4.21 MIL/uL — ABNORMAL LOW (ref 4.22–5.81)
RDW: 11.6 % (ref 11.5–15.5)
WBC: 5.5 10*3/uL (ref 4.0–10.5)
nRBC: 0 % (ref 0.0–0.2)

## 2022-02-23 LAB — RAD ONC ARIA SESSION SUMMARY
Course Elapsed Days: 24
Plan Fractions Treated to Date: 13
Plan Prescribed Dose Per Fraction: 1.8 Gy
Plan Total Fractions Prescribed: 20
Plan Total Prescribed Dose: 36 Gy
Reference Point Dosage Given to Date: 32.4 Gy
Reference Point Session Dosage Given: 1.8 Gy
Session Number: 18

## 2022-02-24 ENCOUNTER — Other Ambulatory Visit: Payer: Self-pay

## 2022-02-24 ENCOUNTER — Ambulatory Visit
Admission: RE | Admit: 2022-02-24 | Discharge: 2022-02-24 | Disposition: A | Discharge status: Home / Self Care | Payer: Medicaid Other | Source: Ambulatory Visit | Attending: Radiation Oncology | Admitting: Radiation Oncology

## 2022-02-24 DIAGNOSIS — Z510 Encounter for antineoplastic radiation therapy: Secondary | ICD-10-CM | POA: Diagnosis not present

## 2022-02-24 LAB — RAD ONC ARIA SESSION SUMMARY
Course Elapsed Days: 25
Plan Fractions Treated to Date: 14
Plan Prescribed Dose Per Fraction: 1.8 Gy
Plan Total Fractions Prescribed: 20
Plan Total Prescribed Dose: 36 Gy
Reference Point Dosage Given to Date: 34.2 Gy
Reference Point Session Dosage Given: 1.8 Gy
Session Number: 19

## 2022-02-27 ENCOUNTER — Other Ambulatory Visit: Payer: Self-pay

## 2022-02-27 ENCOUNTER — Ambulatory Visit
Admission: RE | Admit: 2022-02-27 | Discharge: 2022-02-27 | Disposition: A | Discharge status: Home / Self Care | Payer: Medicaid Other | Source: Ambulatory Visit | Attending: Radiation Oncology | Admitting: Radiation Oncology

## 2022-02-27 DIAGNOSIS — Z510 Encounter for antineoplastic radiation therapy: Secondary | ICD-10-CM | POA: Diagnosis not present

## 2022-02-27 LAB — RAD ONC ARIA SESSION SUMMARY
Course Elapsed Days: 28
Plan Fractions Treated to Date: 15
Plan Prescribed Dose Per Fraction: 1.8 Gy
Plan Total Fractions Prescribed: 20
Plan Total Prescribed Dose: 36 Gy
Reference Point Dosage Given to Date: 36 Gy
Reference Point Session Dosage Given: 1.8 Gy
Session Number: 20

## 2022-02-28 ENCOUNTER — Ambulatory Visit
Admission: RE | Admit: 2022-02-28 | Discharge: 2022-02-28 | Disposition: A | Discharge status: Home / Self Care | Payer: Medicaid Other | Source: Ambulatory Visit | Attending: Radiation Oncology | Admitting: Radiation Oncology

## 2022-02-28 ENCOUNTER — Other Ambulatory Visit: Payer: Self-pay

## 2022-02-28 DIAGNOSIS — Z510 Encounter for antineoplastic radiation therapy: Secondary | ICD-10-CM | POA: Diagnosis not present

## 2022-02-28 LAB — RAD ONC ARIA SESSION SUMMARY
Course Elapsed Days: 29
Plan Fractions Treated to Date: 16
Plan Prescribed Dose Per Fraction: 1.8 Gy
Plan Total Fractions Prescribed: 20
Plan Total Prescribed Dose: 36 Gy
Reference Point Dosage Given to Date: 37.8 Gy
Reference Point Session Dosage Given: 1.8 Gy
Session Number: 21

## 2022-03-01 ENCOUNTER — Other Ambulatory Visit: Payer: Self-pay

## 2022-03-01 ENCOUNTER — Ambulatory Visit
Admission: RE | Admit: 2022-03-01 | Discharge: 2022-03-01 | Disposition: A | Discharge status: Home / Self Care | Payer: Medicaid Other | Source: Ambulatory Visit | Attending: Radiation Oncology | Admitting: Radiation Oncology

## 2022-03-01 DIAGNOSIS — Z510 Encounter for antineoplastic radiation therapy: Secondary | ICD-10-CM | POA: Diagnosis not present

## 2022-03-01 LAB — RAD ONC ARIA SESSION SUMMARY
Course Elapsed Days: 30
Plan Fractions Treated to Date: 17
Plan Prescribed Dose Per Fraction: 1.8 Gy
Plan Total Fractions Prescribed: 20
Plan Total Prescribed Dose: 36 Gy
Reference Point Dosage Given to Date: 39.6 Gy
Reference Point Session Dosage Given: 1.8 Gy
Session Number: 22

## 2022-03-02 ENCOUNTER — Ambulatory Visit
Admission: RE | Admit: 2022-03-02 | Discharge: 2022-03-02 | Disposition: A | Discharge status: Home / Self Care | Payer: Medicaid Other | Source: Ambulatory Visit | Attending: Radiation Oncology | Admitting: Radiation Oncology

## 2022-03-02 ENCOUNTER — Other Ambulatory Visit: Payer: Self-pay

## 2022-03-02 ENCOUNTER — Inpatient Hospital Stay: Payer: Medicaid Other

## 2022-03-02 DIAGNOSIS — Z510 Encounter for antineoplastic radiation therapy: Secondary | ICD-10-CM | POA: Diagnosis not present

## 2022-03-02 DIAGNOSIS — C189 Malignant neoplasm of colon, unspecified: Secondary | ICD-10-CM

## 2022-03-02 LAB — CBC
HCT: 40 % (ref 39.0–52.0)
Hemoglobin: 13.5 g/dL (ref 13.0–17.0)
MCH: 33.3 pg (ref 26.0–34.0)
MCHC: 33.8 g/dL (ref 30.0–36.0)
MCV: 98.5 fL (ref 80.0–100.0)
Platelets: 173 10*3/uL (ref 150–400)
RBC: 4.06 MIL/uL — ABNORMAL LOW (ref 4.22–5.81)
RDW: 11.8 % (ref 11.5–15.5)
WBC: 4.9 10*3/uL (ref 4.0–10.5)
nRBC: 0 % (ref 0.0–0.2)

## 2022-03-02 LAB — RAD ONC ARIA SESSION SUMMARY
Course Elapsed Days: 31
Plan Fractions Treated to Date: 18
Plan Prescribed Dose Per Fraction: 1.8 Gy
Plan Total Fractions Prescribed: 20
Plan Total Prescribed Dose: 36 Gy
Reference Point Dosage Given to Date: 41.4 Gy
Reference Point Session Dosage Given: 1.8 Gy
Session Number: 23

## 2022-03-03 ENCOUNTER — Ambulatory Visit: Payer: Medicaid Other

## 2022-03-03 ENCOUNTER — Ambulatory Visit
Admission: RE | Admit: 2022-03-03 | Discharge: 2022-03-03 | Disposition: A | Discharge status: Home / Self Care | Payer: Medicaid Other | Source: Ambulatory Visit | Attending: Radiation Oncology | Admitting: Radiation Oncology

## 2022-03-03 ENCOUNTER — Other Ambulatory Visit: Payer: Self-pay

## 2022-03-03 DIAGNOSIS — Z510 Encounter for antineoplastic radiation therapy: Secondary | ICD-10-CM | POA: Diagnosis not present

## 2022-03-03 LAB — RAD ONC ARIA SESSION SUMMARY
Course Elapsed Days: 32
Plan Fractions Treated to Date: 19
Plan Prescribed Dose Per Fraction: 1.8 Gy
Plan Total Fractions Prescribed: 20
Plan Total Prescribed Dose: 36 Gy
Reference Point Dosage Given to Date: 43.2 Gy
Reference Point Session Dosage Given: 1.8 Gy
Session Number: 24

## 2022-03-05 ENCOUNTER — Encounter: Payer: Self-pay | Admitting: Oncology

## 2022-03-05 NOTE — Progress Notes (Signed)
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  He was found to have a hemoglobin of 4.5/18.7 on admission.  He received blood transfusion as well as IV iron and his hemoglobin is presently up to an 8.  He underwent colonoscopy which showed malignant partially obstructing tumor that was circumferential 80 cm proximal to the anus.  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  CT chest abdomen and pelvis with contrast did not show any evidence of distant metastatic disease.  2 small sclerotic densities in the acetabulum and proximal right femur likely benign process.  Pathologically enlarged lymph nodes superior to circumferential colonic lesion suggesting metastatic adenopathy.   Patient underwent right hemicolectomy with Dr. Dahlia Byes on 07/12/2021.  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  Vermiform appendix negative for tumor.  Radial margin positive for tumor (2 lymph nodes at margin with adenocarcinoma).  Proximal  and distal margins negative.  7 out of 56 lymph nodes positive for metastatic adenocarcinoma.  Tumor deposits not applicable.  No lymphovascular or perineural invasion seen.  PT4APN2B   Fertility conservation was also discussed an option of sperm banking at [facility] was discussed with the patient and his healthcare power of attorney [name].  There would be a one-time cost involved for sperm banking and a yearly cost for preserving the specimen.  Overall chances of infertility with FOLFOX is low but possible.  Patient and his healthcare power of attorney have discussed their options and have decided not to proceed with fertility conservation at this time   Patient completed 12 cycles of adjuvant FOLFOX chemotherapy in followed by adjuvant radiation therapy in September 2023   Interval history-patient is presently doing well.  Bowel movements are regular.  Denies any significant tingling numbness in his hands and feet.  ECOG PS- 0 Pain scale- 0   Review of systems- Review of Systems  Constitutional:  Negative for chills, fever, malaise/fatigue and weight loss.  HENT:  Negative for congestion, ear discharge and nosebleeds.   Eyes:  Negative for blurred vision.  Respiratory:  Negative for cough, hemoptysis, sputum production, shortness of breath and wheezing.   Cardiovascular:  Negative for chest pain, palpitations, orthopnea and claudication.  Gastrointestinal:  Negative for abdominal pain, blood in stool, constipation, diarrhea, heartburn, melena, nausea and vomiting.  Genitourinary:  Negative for dysuria, flank pain, frequency, hematuria and urgency.  Musculoskeletal:  Negative for back pain, joint pain and myalgias.  Skin:  Negative for rash.  Neurological:  Negative for dizziness, tingling, focal weakness, seizures, weakness and headaches.  Endo/Heme/Allergies:  Does not bruise/bleed easily.  Psychiatric/Behavioral:  Negative for depression and suicidal ideas. The patient does  not have  insomnia.       Allergies  Allergen Reactions   Geodon [Ziprasidone Hcl] Anaphylaxis     Past Medical History:  Diagnosis Date   Bipolar 1 disorder ([place])    Colon cancer ([place])    PTSD (post-traumatic stress disorder)      Past Surgical History:  Procedure Laterality Date   COLON RESECTION N/A 07/12/2021   Procedure: HAND ASSISTED LAPAROSCOPIC COLON RESECTION;  Surgeon: Jules Husbands, MD;  Location: [facility] ORS;  Service: General;  Laterality: N/A;   COLONOSCOPY WITH PROPOFOL N/A 07/06/2021   Procedure: COLONOSCOPY WITH PROPOFOL;  Surgeon: Lin Landsman, MD;  Location: [facility] ENDOSCOPY;  Service: Gastroenterology;  Laterality: N/A;   COLONOSCOPY WITH PROPOFOL N/A 07/07/2021   Procedure: COLONOSCOPY WITH PROPOFOL;  Surgeon: Lin Landsman, MD;  Location: [facility] ENDOSCOPY;  Service: Gastroenterology;  Laterality: N/A;   ESOPHAGOGASTRODUODENOSCOPY (EGD) WITH PROPOFOL N/A 07/06/2021   Procedure: ESOPHAGOGASTRODUODENOSCOPY (EGD) WITH PROPOFOL;  Surgeon: Lin Landsman, MD;  Location: [facility] ENDOSCOPY;  Service: Gastroenterology;  Laterality: N/A;   GIVENS CAPSULE STUDY N/A 07/06/2021   Procedure: GIVENS CAPSULE STUDY;  Surgeon: Lin Landsman, MD;  Location: [facility] ENDOSCOPY;  Service: Gastroenterology;  Laterality: N/A;   IR REMOVAL TUN ACCESS W/ PORT W/O FL MOD SED  02/15/2022   PORTACATH PLACEMENT Right 07/22/2021   Procedure: INSERTION PORT-A-CATH;  Surgeon: Jules Husbands, MD;  Location: [facility] ORS;  Service: General;  Laterality: Right;    Social History   Socioeconomic History   Marital status: Single    Spouse name: Not on file   Number of children: Not on file   Years of education: Not on file   Highest education level: Not on file  Occupational History   Not on file  Tobacco Use   Smoking status: Every Day    Packs/day: 0.25    Types: Cigarettes   Smokeless tobacco: Never   Tobacco comments:    1 cig a 5 days  Vaping Use   Vaping Use: Every day   Devices:  elfbar  Substance and Sexual Activity   Alcohol use: Not Currently   Drug use: Not Currently   Sexual activity: Yes  Other Topics Concern   Not on file  Social History Narrative   Not on file   Social Determinants of Health   Financial Resource Strain: Not on file  Food Insecurity: Not on file  Transportation Needs: Not on file  Physical Activity: Not on file  Stress: Not on file  Social Connections: Not on file  Intimate Partner Violence: Not on file    Family History  Problem Relation Age of Onset   Kidney cancer Father    Breast cancer Maternal Aunt      Current Outpatient Medications:    ARIPiprazole ER (ABILIFY MAINTENA) 400 MG PRSY prefilled syringe, Inject 400 mg into the muscle every 21 ( twenty-one) days., Disp: , Rfl:    benztropine (COGENTIN) 2 MG tablet, Take 2 mg by mouth at bedtime., Disp: , Rfl:    busPIRone (BUSPAR) 10 MG tablet, Take 10 mg by mouth 2 (two) times daily., Disp: , Rfl:    dexamethasone (DECADRON) 4 MG tablet, TAKE 2 TABLETS ('8MG'$ ) BY MOUTH ONCE DAILY *START THE DAY AFTER CHEMOTHERAPY FOR 2 DAYS. TAKE WITH FOOD., Disp: 30 tablet, Rfl: 10   FLUoxetine (PROZAC) 20 MG capsule, Take 20 mg by mouth daily., Disp: , Rfl:    ibuprofen (ADVIL) 600 MG tablet, Take 1 tablet (600 mg  total) by mouth every 6 (six) hours as needed., Disp: 30 tablet, Rfl: 0   polyethylene glycol (MIRALAX / GLYCOLAX) 17 g packet, Take 17 g by mouth daily as needed (constipation)., Disp: 14 each, Rfl: 0   prazosin (MINIPRESS) 2 MG capsule, Take 2 mg by mouth 2 (two) times daily., Disp: , Rfl:    oxyCODONE (OXY IR/ROXICODONE) 5 MG immediate release tablet, Take 1 tablet (5 mg total) by mouth every 6 (six) hours as needed for severe pain or breakthrough pain. (Patient not taking: Reported on 08/17/2021), Disp: 25 tablet, Rfl: 0  Physical exam:  Vitals:   02/21/22 1400  BP: 117/75  Pulse: 83  Resp: 16  Temp: (!) 97.5 F (36.4 C)  TempSrc: Tympanic  Weight: 186 lb 4.8 oz (84.5 kg)    Physical Exam Cardiovascular:     Rate and Rhythm: Normal rate and regular rhythm.     Heart sounds: Normal heart sounds.  Pulmonary:     Effort: Pulmonary effort is normal.     Breath sounds: Normal breath sounds.  Abdominal:     General: Bowel sounds are normal.     Palpations: Abdomen is soft.  Skin:    General: Skin is warm and dry.  Neurological:     Mental Status: He is alert and oriented to person, place, and time.         Latest Ref Rng & Units 02/21/2022    2:17 PM  CMP  Glucose 70 - 99 mg/dL 97   BUN 6 - 20 mg/dL 13   Creatinine 0.61 - 1.24 mg/dL 0.84   Sodium 135 - 145 mmol/L 139   Potassium 3.5 - 5.1 mmol/L 4.3   Chloride 98 - 111 mmol/L 103   CO2 22 - 32 mmol/L 28   Calcium 8.9 - 10.3 mg/dL 9.5   Total Protein 6.5 - 8.1 g/dL 7.2   Total Bilirubin 0.3 - 1.2 mg/dL 0.4   Alkaline Phos 38 - 126 U/L 65   AST 15 - 41 U/L 23   ALT 0 - 44 U/L 17       Latest Ref Rng & Units 03/02/2022    2:52 PM  CBC  WBC 4.0 - 10.5 K/uL 4.9   Hemoglobin 13.0 - 17.0 g/dL 13.5   Hematocrit 39.0 - 52.0 % 40.0   Platelets 150 - 400 K/uL 173     No images are attached to the encounter.  CT Abdomen Pelvis W Contrast  Result Date: 02/17/2022 CLINICAL DATA:  Acute nonlocalized abdominal pain. History of colon cancer. EXAM: CT ABDOMEN AND PELVIS WITH CONTRAST TECHNIQUE: Multidetector CT imaging of the abdomen and pelvis was performed using the standard protocol following bolus administration of intravenous contrast. RADIATION DOSE REDUCTION: This exam was performed according to the departmental dose-optimization program which includes automated exposure control, adjustment of the mA and/or kV according to patient size and/or use of iterative reconstruction technique. CONTRAST:  156m OMNIPAQUE IOHEXOL 300 MG/ML  SOLN COMPARISON:  CT chest abdomen pelvis January 16, 2022. FINDINGS: Lower chest: No acute abnormality. Hepatobiliary: No focal liver abnormality is seen. No gallstones,  gallbladder wall thickening, or biliary dilatation. Pancreas: No pancreatic ductal dilation or evidence of acute inflammation. Spleen: No splenomegaly or focal splenic lesion. Adrenals/Urinary Tract: Bilateral adrenal glands appear normal. No hydronephrosis. Kidneys demonstrate symmetric enhancement. Urinary bladder is unremarkable for degree of distension. Stomach/Bowel: Stomach is unremarkable for degree of distension. No pathologic dilation of small or large bowel. Appendix is not confidently identified  however there is no pericecal inflammation. Signs of prior partial colonic resection with anastomotic sutures in the anterior abdomen. Gas fluid levels throughout the colon. Vascular/Lymphatic: Normal caliber abdominal aorta. No pathologically enlarged abdominal or pelvic lymph nodes. Reproductive: Prostate is unremarkable. Other: Trace pelvic free fluid. No discrete peritoneal or omental nodularity. Postsurgical change in the abdominal wall. Musculoskeletal: Bone island in the right greater trochanter and right acetabulum. No acute osseous abnormality. IMPRESSION: 1. Gas fluid levels throughout the colon, which can be seen in the setting of diarrheal illness. 2. Signs of prior partial colonic resection with anastomotic sutures in the anterior abdomen. 3. Trace pelvic free fluid, nonspecific but possibly reactive. No discrete peritoneal or omental nodularity. Electronically Signed   By: Dahlia Bailiff M.D.   On: 02/17/2022 18:24   IR Removal Tun Access W/ Port W/O FL  Result Date: 02/15/2022 INDICATION: Port no longer needed EXAM: Port removal MEDICATIONS: None ANESTHESIA/SEDATION: Local analgesia FLUOROSCOPY: N/a COMPLICATIONS: None immediate. PROCEDURE: Informed written consent was obtained from the patient after a thorough discussion of the procedural risks, benefits and alternatives. All questions were addressed. Maximal Sterile Barrier Technique was utilized including caps, mask, sterile gowns, sterile  gloves, sterile drape, hand hygiene and skin antiseptic. A timeout was performed prior to the initiation of the procedure. The right chest was prepped and draped in the standard sterile fashion. Lidocaine was utilized for local anesthesia. An incision was made over the previously healed surgical incision. Utilizing blunt dissection, the port catheter and reservoir were removed from the underlying subcutaneous tissue in their entirety. After hemostasis, the subcutaneous pockets was closed in two layers using 3-0 and 4-0 Vicryl. Dermabond was also applied. The patient tolerated the procedure well without immediate complication. IMPRESSION: Successful removal of right chest port. Electronically Signed   By: Albin Felling M.D.   On: 02/15/2022 15:52     Assessment and plan- Patient is a 36 y.o. male with history of stage III colon cancer s/p 12 cycles of adjuvant FOLFOX chemotherapy and radiation therapy here to discuss CT scan results and further management  I reviewed the chest abdomen and pelvis images independently and discussed findings with the patient which does not show any evidence of recurrent or progressive disease.  He remains in remission at this time and plan would be to get repeat scans every 6 months for up to 5 years and physical exams every 3 months.  He did present to the ER with some history of abdominal pain and had another CT scan on 02/17/2022 which also did not show any acute pathology.  His port is out and I will see him back with CBC with differential CMP CEA in 3 months    Visit Diagnosis 1. Encounter for follow-up surveillance of colon cancer      Dr. Randa Evens, MD, MPH [facility] [phone] 03/05/2022 11:17 AM

## 2022-03-06 ENCOUNTER — Ambulatory Visit: Payer: Medicaid Other

## 2022-03-07 ENCOUNTER — Other Ambulatory Visit: Payer: Self-pay

## 2022-03-07 ENCOUNTER — Ambulatory Visit
Admission: RE | Admit: 2022-03-07 | Discharge: 2022-03-07 | Disposition: A | Discharge status: Home / Self Care | Payer: Medicaid Other | Source: Ambulatory Visit | Attending: Radiation Oncology | Admitting: Radiation Oncology

## 2022-03-07 DIAGNOSIS — Z510 Encounter for antineoplastic radiation therapy: Secondary | ICD-10-CM | POA: Diagnosis not present

## 2022-03-07 LAB — RAD ONC ARIA SESSION SUMMARY
Course Elapsed Days: 36
Plan Fractions Treated to Date: 20
Plan Prescribed Dose Per Fraction: 1.8 Gy
Plan Total Fractions Prescribed: 20
Plan Total Prescribed Dose: 36 Gy
Reference Point Dosage Given to Date: 45 Gy
Reference Point Session Dosage Given: 1.8 Gy
Session Number: 25

## 2022-03-09 ENCOUNTER — Other Ambulatory Visit: Payer: Self-pay

## 2022-04-19 ENCOUNTER — Ambulatory Visit: Payer: Medicaid Other | Attending: Radiation Oncology | Admitting: Radiation Oncology

## 2022-04-19 DIAGNOSIS — C184 Malignant neoplasm of transverse colon: Secondary | ICD-10-CM | POA: Insufficient documentation

## 2022-04-19 DIAGNOSIS — R7989 Other specified abnormal findings of blood chemistry: Secondary | ICD-10-CM | POA: Insufficient documentation

## 2022-04-19 DIAGNOSIS — D649 Anemia, unspecified: Secondary | ICD-10-CM | POA: Insufficient documentation

## 2022-04-19 DIAGNOSIS — Z5111 Encounter for antineoplastic chemotherapy: Secondary | ICD-10-CM | POA: Insufficient documentation

## 2022-04-19 DIAGNOSIS — Z510 Encounter for antineoplastic radiation therapy: Secondary | ICD-10-CM | POA: Insufficient documentation

## 2022-04-19 DIAGNOSIS — F319 Bipolar disorder, unspecified: Secondary | ICD-10-CM | POA: Insufficient documentation

## 2022-04-19 DIAGNOSIS — F17210 Nicotine dependence, cigarettes, uncomplicated: Secondary | ICD-10-CM | POA: Insufficient documentation

## 2022-04-19 DIAGNOSIS — Z803 Family history of malignant neoplasm of breast: Secondary | ICD-10-CM | POA: Insufficient documentation

## 2022-04-19 DIAGNOSIS — Z8051 Family history of malignant neoplasm of kidney: Secondary | ICD-10-CM | POA: Insufficient documentation

## 2022-04-19 DIAGNOSIS — C779 Secondary and unspecified malignant neoplasm of lymph node, unspecified: Secondary | ICD-10-CM | POA: Insufficient documentation

## 2022-04-19 DIAGNOSIS — Z79899 Other long term (current) drug therapy: Secondary | ICD-10-CM | POA: Insufficient documentation

## 2022-04-19 DIAGNOSIS — D6959 Other secondary thrombocytopenia: Secondary | ICD-10-CM | POA: Insufficient documentation

## 2022-04-25 ENCOUNTER — Telehealth: Payer: Self-pay | Admitting: *Deleted

## 2022-04-25 NOTE — Telephone Encounter (Signed)
Can you please do this? 

## 2022-04-25 NOTE — Telephone Encounter (Signed)
Cola Mee Hives called from assisted living facility requesting an or to discontinue the Dexamethasone since patient is no longer on chemotherapy. Fax order to [phone] attn Cola

## 2022-04-26 ENCOUNTER — Encounter: Payer: Self-pay | Admitting: Oncology

## 2022-05-24 ENCOUNTER — Inpatient Hospital Stay: Payer: Medicaid Other | Attending: Oncology

## 2022-05-24 ENCOUNTER — Inpatient Hospital Stay (HOSPITAL_BASED_OUTPATIENT_CLINIC_OR_DEPARTMENT_OTHER): Payer: Medicaid Other | Admitting: Oncology

## 2022-05-24 ENCOUNTER — Encounter: Payer: Self-pay | Admitting: Oncology

## 2022-05-24 VITALS — BP 128/96 | HR 84 | Temp 97.5°F | Resp 18 | Ht 72.0 in | Wt 183.0 lb

## 2022-05-24 DIAGNOSIS — Z803 Family history of malignant neoplasm of breast: Secondary | ICD-10-CM | POA: Insufficient documentation

## 2022-05-24 DIAGNOSIS — Z85038 Personal history of other malignant neoplasm of large intestine: Secondary | ICD-10-CM

## 2022-05-24 DIAGNOSIS — Z923 Personal history of irradiation: Secondary | ICD-10-CM | POA: Diagnosis not present

## 2022-05-24 DIAGNOSIS — Z08 Encounter for follow-up examination after completed treatment for malignant neoplasm: Secondary | ICD-10-CM

## 2022-05-24 DIAGNOSIS — C189 Malignant neoplasm of colon, unspecified: Secondary | ICD-10-CM | POA: Diagnosis present

## 2022-05-24 DIAGNOSIS — F17210 Nicotine dependence, cigarettes, uncomplicated: Secondary | ICD-10-CM | POA: Diagnosis not present

## 2022-05-24 DIAGNOSIS — Z8051 Family history of malignant neoplasm of kidney: Secondary | ICD-10-CM | POA: Diagnosis not present

## 2022-05-24 LAB — CBC WITH DIFFERENTIAL/PLATELET
Abs Immature Granulocytes: 0.01 10*3/uL (ref 0.00–0.07)
Basophils Absolute: 0 10*3/uL (ref 0.0–0.1)
Basophils Relative: 0 %
Eosinophils Absolute: 0 10*3/uL (ref 0.0–0.5)
Eosinophils Relative: 1 %
HCT: 42.4 % (ref 39.0–52.0)
Hemoglobin: 13.9 g/dL (ref 13.0–17.0)
Immature Granulocytes: 0 %
Lymphocytes Relative: 20 %
Lymphs Abs: 0.8 10*3/uL (ref 0.7–4.0)
MCH: 31.3 pg (ref 26.0–34.0)
MCHC: 32.8 g/dL (ref 30.0–36.0)
MCV: 95.5 fL (ref 80.0–100.0)
Monocytes Absolute: 0.3 10*3/uL (ref 0.1–1.0)
Monocytes Relative: 7 %
Neutro Abs: 3 10*3/uL (ref 1.7–7.7)
Neutrophils Relative %: 72 %
Platelets: 220 10*3/uL (ref 150–400)
RBC: 4.44 MIL/uL (ref 4.22–5.81)
RDW: 12.2 % (ref 11.5–15.5)
WBC: 4.2 10*3/uL (ref 4.0–10.5)
nRBC: 0 % (ref 0.0–0.2)

## 2022-05-24 LAB — COMPREHENSIVE METABOLIC PANEL
ALT: 17 U/L (ref 0–44)
AST: 21 U/L (ref 15–41)
Albumin: 4.3 g/dL (ref 3.5–5.0)
Alkaline Phosphatase: 64 U/L (ref 38–126)
Anion gap: 7 (ref 5–15)
BUN: 12 mg/dL (ref 6–20)
CO2: 25 mmol/L (ref 22–32)
Calcium: 8.9 mg/dL (ref 8.9–10.3)
Chloride: 106 mmol/L (ref 98–111)
Creatinine, Ser: 0.92 mg/dL (ref 0.61–1.24)
GFR, Estimated: >60 mL/min (ref >60)
Glucose, Bld: 98 mg/dL (ref 70–99)
Potassium: 4 mmol/L (ref 3.5–5.1)
Sodium: 138 mmol/L (ref 135–145)
Total Bilirubin: 0.3 mg/dL (ref 0.3–1.2)
Total Protein: 6.8 g/dL (ref 6.5–8.1)

## 2022-05-24 NOTE — Progress Notes (Signed)
Hematology/Oncology Consult note [facility]  Telephone:([phone] [phone]  Patient Care Team: [name], MD as PCP - General (Internal Medicine) Sindy Guadeloupe, MD as Consulting Physician (Hematology and Oncology) Jules Husbands, MD as Consulting Physician (General Surgery) Lin Landsman, MD as Consulting Physician (Gastroenterology) Clent Jacks, RN as Oncology Nurse Navigator   Name of the patient: Connor Wilkinson  [id number]  1985/04/29   Date of visit: 05/24/22  Diagnosis- stage IIIC adenocarcinoma of the colon [id number] currently in remission  Chief complaint/ Reason for visit-routine follow-up of colon cancer  Heme/Onc history: patient is a 37 year old male with a past medical history significant for mild bipolar disorder who is a ward of the stateAnd was admitted to the hospital for severe anemia.  He was found to have a hemoglobin of 4.5/18.7 on admission.  He received blood transfusion as well as IV iron and his hemoglobin is presently up to an 8.  He underwent colonoscopy which showed malignant partially obstructing tumor that was circumferential 80 cm proximal to the anus.  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  CT chest abdomen and pelvis with contrast did not show any evidence of distant metastatic disease.  2 small sclerotic densities in the acetabulum and proximal right femur likely benign process.  Pathologically enlarged lymph nodes superior to circumferential colonic lesion suggesting metastatic adenopathy.   Patient underwent right hemicolectomy with Dr. Dahlia Byes on 07/12/2021.  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  Vermiform appendix negative for tumor.  Radial margin positive for tumor (2 lymph nodes at margin with  adenocarcinoma).  Proximal and distal margins negative.  7 out of 56 lymph nodes positive for metastatic adenocarcinoma.  Tumor deposits not applicable.  No lymphovascular or perineural invasion seen.  PT4APN2B   Fertility conservation was also discussed an option of sperm banking at [facility] was discussed with the patient and his healthcare power of attorney [name].  There would be a one-time cost involved for sperm banking and a yearly cost for preserving the specimen.  Overall chances of infertility with FOLFOX is low but possible.  Patient and his healthcare power of attorney have discussed their options and have decided not to proceed with fertility conservation at this time   Patient completed 12 cycles of adjuvant FOLFOX chemotherapy in followed by adjuvant radiation therapy in September 2023     Interval history-he is doing well presently and denies any specific complaints at this time.  Denies any changes in his bowel habits.  Denies any blood in his stool or urine.  ECOG PS- 0 Pain scale- 0   Review of systems- Review of Systems  Constitutional:  Negative for chills, fever, malaise/fatigue and weight loss.  HENT:  Negative for congestion, ear discharge and nosebleeds.   Eyes:  Negative for blurred vision.  Respiratory:  Negative for cough, hemoptysis, sputum production, shortness of breath and wheezing.   Cardiovascular:  Negative for chest pain, palpitations, orthopnea and claudication.  Gastrointestinal:  Negative for abdominal pain, blood in stool, constipation, diarrhea, heartburn, melena, nausea and vomiting.  Genitourinary:  Negative for dysuria, flank pain, frequency, hematuria and urgency.  Musculoskeletal:  Negative for back pain, joint pain and myalgias.  Skin:  Negative for rash.  Neurological:  Negative for dizziness, tingling, focal weakness, seizures, weakness and headaches.  Endo/Heme/Allergies:  Does not bruise/bleed  easily.  Psychiatric/Behavioral:  Negative for  depression and suicidal ideas. The patient does not have insomnia.       Allergies  Allergen Reactions   Geodon [Ziprasidone Hcl] Anaphylaxis     Past Medical History:  Diagnosis Date   Bipolar 1 disorder ([place])    Colon cancer ([place])    PTSD (post-traumatic stress disorder)      Past Surgical History:  Procedure Laterality Date   COLON RESECTION N/A 07/12/2021   Procedure: HAND ASSISTED LAPAROSCOPIC COLON RESECTION;  Surgeon: Jules Husbands, MD;  Location: [facility] ORS;  Service: General;  Laterality: N/A;   COLONOSCOPY WITH PROPOFOL N/A 07/06/2021   Procedure: COLONOSCOPY WITH PROPOFOL;  Surgeon: Lin Landsman, MD;  Location: [facility] ENDOSCOPY;  Service: Gastroenterology;  Laterality: N/A;   COLONOSCOPY WITH PROPOFOL N/A 07/07/2021   Procedure: COLONOSCOPY WITH PROPOFOL;  Surgeon: Lin Landsman, MD;  Location: [facility] ENDOSCOPY;  Service: Gastroenterology;  Laterality: N/A;   ESOPHAGOGASTRODUODENOSCOPY (EGD) WITH PROPOFOL N/A 07/06/2021   Procedure: ESOPHAGOGASTRODUODENOSCOPY (EGD) WITH PROPOFOL;  Surgeon: Lin Landsman, MD;  Location: [facility] ENDOSCOPY;  Service: Gastroenterology;  Laterality: N/A;   GIVENS CAPSULE STUDY N/A 07/06/2021   Procedure: GIVENS CAPSULE STUDY;  Surgeon: Lin Landsman, MD;  Location: [facility] ENDOSCOPY;  Service: Gastroenterology;  Laterality: N/A;   IR REMOVAL TUN ACCESS W/ PORT W/O FL MOD SED  02/15/2022   PORTACATH PLACEMENT Right 07/22/2021   Procedure: INSERTION PORT-A-CATH;  Surgeon: Jules Husbands, MD;  Location: [facility] ORS;  Service: General;  Laterality: Right;    Social History   Socioeconomic History   Marital status: Single    Spouse name: Not on file   Number of children: Not on file   Years of education: Not on file   Highest education level: Not on file  Occupational History   Not on file  Tobacco Use   Smoking status: Every Day    Packs/day: 0.25    Types: Cigarettes   Smokeless tobacco: Never   Tobacco comments:    1 cig a 5  days  Vaping Use   Vaping Use: Every day   Devices: elfbar  Substance and Sexual Activity   Alcohol use: Not Currently   Drug use: Not Currently   Sexual activity: Yes  Other Topics Concern   Not on file  Social History Narrative   Not on file   Social Determinants of Health   Financial Resource Strain: Not on file  Food Insecurity: Not on file  Transportation Needs: Not on file  Physical Activity: Not on file  Stress: Not on file  Social Connections: Not on file  Intimate Partner Violence: Not on file    Family History  Problem Relation Age of Onset   Kidney cancer Father    Breast cancer Maternal Aunt      Current Outpatient Medications:    ARIPiprazole ER (ABILIFY MAINTENA) 400 MG PRSY prefilled syringe, Inject 400 mg into the muscle every 21 ( twenty-one) days., Disp: , Rfl:    benztropine (COGENTIN) 2 MG tablet, Take 2 mg by mouth at bedtime., Disp: , Rfl:    busPIRone (BUSPAR) 10 MG tablet, Take 10 mg by mouth 2 (two) times daily., Disp: , Rfl:    dexamethasone (DECADRON) 4 MG tablet, TAKE 2 TABLETS ('8MG'$ ) BY MOUTH ONCE DAILY *START THE DAY AFTER CHEMOTHERAPY FOR 2 DAYS. TAKE WITH FOOD., Disp: 30 tablet, Rfl: 10   FLUoxetine (PROZAC) 20 MG capsule, Take 20 mg by mouth daily., Disp: , Rfl:  ibuprofen (ADVIL) 600 MG tablet, Take 1 tablet (600 mg total) by mouth every 6 (six) hours as needed., Disp: 30 tablet, Rfl: 0   oxyCODONE (OXY IR/ROXICODONE) 5 MG immediate release tablet, Take 1 tablet (5 mg total) by mouth every 6 (six) hours as needed for severe pain or breakthrough pain., Disp: 25 tablet, Rfl: 0   polyethylene glycol (MIRALAX / GLYCOLAX) 17 g packet, Take 17 g by mouth daily as needed (constipation)., Disp: 14 each, Rfl: 0   prazosin (MINIPRESS) 2 MG capsule, Take 2 mg by mouth 2 (two) times daily., Disp: , Rfl:   Physical exam:  Vitals:   05/24/22 1411  BP: (!) 128/96  Pulse: 84  Resp: 18  Temp: (!) 97.5 F (36.4 C)  TempSrc: Tympanic  SpO2: 99%   Weight: 183 lb (83 kg)  Height: 6' (1.829 m)   Physical Exam Cardiovascular:     Rate and Rhythm: Normal rate and regular rhythm.     Heart sounds: Normal heart sounds.  Pulmonary:     Effort: Pulmonary effort is normal.     Breath sounds: Normal breath sounds.  Abdominal:     General: Bowel sounds are normal.     Palpations: Abdomen is soft.  Skin:    General: Skin is warm and dry.  Neurological:     Mental Status: He is alert and oriented to person, place, and time.         Latest Ref Rng & Units 05/24/2022    1:47 PM  CMP  Glucose 70 - 99 mg/dL 98   BUN 6 - 20 mg/dL 12   Creatinine 0.61 - 1.24 mg/dL 0.92   Sodium 135 - 145 mmol/L 138   Potassium 3.5 - 5.1 mmol/L 4.0   Chloride 98 - 111 mmol/L 106   CO2 22 - 32 mmol/L 25   Calcium 8.9 - 10.3 mg/dL 8.9   Total Protein 6.5 - 8.1 g/dL 6.8   Total Bilirubin 0.3 - 1.2 mg/dL 0.3   Alkaline Phos 38 - 126 U/L 64   AST 15 - 41 U/L 21   ALT 0 - 44 U/L 17       Latest Ref Rng & Units 05/24/2022    1:47 PM  CBC  WBC 4.0 - 10.5 K/uL 4.2   Hemoglobin 13.0 - 17.0 g/dL 13.9   Hematocrit 39.0 - 52.0 % 42.4   Platelets 150 - 400 K/uL 220      Assessment and plan- Patient is a 37 y.o. male  with history of stage III colon cancer s/p 12 cycles of adjuvant FOLFOX chemotherapy and radiation therapy.  He is here for routine surveillance visit  Clinically patient is doing well with no concerning signs and symptoms of recurrence based on today's exam.  His last scan in November 2023 did not show any progressive disease.  I will see him back in June 2024.  CBC with differential CMP CEA and CT chest abdomen and pelvis with contrast prior.  Patient had a surgery for colon cancer in March 2023 and he will be due for a repeat colonoscopy in March 2024.  I will alert Dr. Marius Ditch about this.   Visit Diagnosis 1. Encounter for follow-up surveillance of colon cancer      Dr. Randa Evens, MD, MPH [facility] [phone] 05/24/2022 3:24 PM

## 2022-05-25 ENCOUNTER — Other Ambulatory Visit: Payer: Self-pay

## 2022-05-25 ENCOUNTER — Telehealth: Payer: Self-pay

## 2022-05-25 DIAGNOSIS — C189 Malignant neoplasm of colon, unspecified: Secondary | ICD-10-CM

## 2022-05-25 MED ORDER — GOLYTELY 236 G PO SOLR: 4000.0000 mL | Freq: Once | ORAL | 0 refills | Status: AC

## 2022-05-25 NOTE — Telephone Encounter (Signed)
Case manager for patient to schedule his colonoscopy. He said to call the Transportation man name gerald at [phone]. Called gerald and got patient schedule for 06/22/2022 went over instructions and mailed them. Sent prep to the pharmacy

## 2022-05-25 NOTE — Telephone Encounter (Signed)
-----   Message from Lin Landsman, MD sent at 05/24/2022  4:24 PM EST ----- Regarding: Colonoscopy Connor Wilkinson  Please schedule his surveillance colonoscopy, next available in next 1 to 2 months Personal history of colon cancer  RV

## 2022-05-26 ENCOUNTER — Other Ambulatory Visit: Payer: Self-pay

## 2022-05-26 LAB — CEA: CEA: 2.9 ng/mL (ref 0.0–4.7)

## 2022-06-02 ENCOUNTER — Other Ambulatory Visit: Payer: Self-pay

## 2022-06-21 ENCOUNTER — Telehealth: Payer: Self-pay | Admitting: *Deleted

## 2022-06-21 NOTE — Telephone Encounter (Signed)
Connor Wilkinson called from Endo Unit ask for me to call  Connor Wilkinson (Legal Guardian) would like to cancel the procedure for now. Connor Wilkinson was unaware and cannot be physical there. Connor Wilkinson will contact case manager and transportation regarding this.

## 2022-06-22 ENCOUNTER — Ambulatory Visit: Admission: RE | Admit: 2022-06-22 | Payer: Medicaid Other | Source: Home / Self Care | Admitting: Gastroenterology

## 2022-06-22 ENCOUNTER — Encounter: Admission: RE | Payer: Self-pay | Source: Home / Self Care

## 2022-06-22 ENCOUNTER — Other Ambulatory Visit: Payer: Self-pay

## 2022-06-22 SURGERY — COLONOSCOPY WITH PROPOFOL
Anesthesia: General

## 2022-06-26 ENCOUNTER — Telehealth: Payer: Self-pay | Admitting: Gastroenterology

## 2022-06-26 NOTE — Telephone Encounter (Signed)
Pt returned call would like a call back.  

## 2022-06-26 NOTE — Telephone Encounter (Signed)
Called patient number and the man that answered said they said that the patient did not call but he thinks the Transportation guy name Gerold call. Return Gerold call and he said he was seeing if the patient was schedule. Informed him no but I have left his guardian a message for call back and waiting for her return call to schedule procedure

## 2022-06-26 NOTE — Telephone Encounter (Signed)
If you looked at patient's record. Caryl Pina had follow up regarding patient cancellation of his colonoscopy. Please view telephone encounter on 06/21/2022.

## 2022-06-26 NOTE — Telephone Encounter (Signed)
Called the transportation and they said it was canceled due to the [place] not knowing about the procedure. He said that she has to sign off on the procedure and send the information to her. Asked him who that was and what the number was. He said he would have to find out that information and call us back/

## 2022-06-26 NOTE — Telephone Encounter (Signed)
Connor Wilkinson with transportation return my call and left a message for me to call him back. Called Connor Wilkinson back and left a message for call back. Called Connor Wilkinson and left a message for call back to find out if she is wanting to schedule the patient a colonoscopy.

## 2022-06-29 NOTE — Telephone Encounter (Signed)
Gerold called to find out if we could scheduled patient colonoscopy with him. He states the guardian told him to call and get it schedule and then get the information to her. Informed him I could not do that I will need to talk to the guardian.

## 2022-06-30 ENCOUNTER — Telehealth: Payer: Self-pay

## 2022-06-30 NOTE — Telephone Encounter (Signed)
-----   Message from Shelby Mattocks, [place] sent at 06/26/2022  4:18 PM EDT ----- Check with Guardian

## 2022-06-30 NOTE — Telephone Encounter (Signed)
Called and left a message for call back for left a message with Maretta Bees patient Legal guardian. I stated on the message that I had to schedule procedure with her due to her being his legal guardian and it was cancel last time. Left my direct number for her to call me back on. Call Berneta Sages the patient transportation to let him know that I left a message for her to call us back to schedule the patient procedure. He states he going to call her manager today

## 2022-07-03 ENCOUNTER — Telehealth: Payer: Self-pay

## 2022-07-03 NOTE — Telephone Encounter (Signed)
Patient legal guardian called back and states she has to sign his consent for him. Informed her if that was the case then she has to be at the procedure. She states that is not physical possible because she has 13 different clients. She states if that is the case then the only day she had available was 07/12/2022. Informed her that day was not available and the next available colonoscopy days were 04/15, 04/16, or 04/17. She states she has not made her April calendar yet so she can not schedule the procedure. Called and informed patient transportation guy name Ronda Fairly and he said thank you for letting him know this information he will contact her manager about this issue.

## 2022-07-03 NOTE — Telephone Encounter (Signed)
Called legal guardian and left a message for call back to schedule colonoscopy for patient

## 2022-07-03 NOTE — Telephone Encounter (Signed)
Patient legal guardian return my call and left a message for call back. Called her back and left a message for call back

## 2022-07-12 ENCOUNTER — Telehealth: Payer: Self-pay

## 2022-07-12 ENCOUNTER — Other Ambulatory Visit: Payer: Self-pay

## 2022-07-12 DIAGNOSIS — C189 Malignant neoplasm of colon, unspecified: Secondary | ICD-10-CM

## 2022-07-12 MED ORDER — GOLYTELY 236 G PO SOLR: 4000.0000 mL | Freq: Once | ORAL | 0 refills | Status: AC

## 2022-07-12 NOTE — Telephone Encounter (Signed)
Patient guardian left  a message yesterday at 12:34pm stating she was ready to schedule colonoscopy.

## 2022-07-12 NOTE — Telephone Encounter (Signed)
[name] Guardian called back at 10:15 and left a message. Return her call and got patient schedule for 08/02/2022. Went over instruction with her and confirmed mailing address. Sent prep to the pharmacy   Called informed Berneta Sages of the date of the procedure.

## 2022-07-12 NOTE — Telephone Encounter (Signed)
Called patient guardian and left a message for call back.

## 2022-08-01 ENCOUNTER — Encounter: Payer: Self-pay | Admitting: Gastroenterology

## 2022-08-02 ENCOUNTER — Encounter
Admission: RE | Disposition: A | Discharge status: Home / Self Care | Payer: Self-pay | Source: Home / Self Care | Attending: Gastroenterology

## 2022-08-02 ENCOUNTER — Encounter: Payer: Self-pay | Admitting: Gastroenterology

## 2022-08-02 ENCOUNTER — Ambulatory Visit
Admission: RE | Admit: 2022-08-02 | Discharge: 2022-08-02 | Disposition: A | Discharge status: Home / Self Care | Payer: Medicaid Other | Attending: Gastroenterology | Admitting: Gastroenterology

## 2022-08-02 ENCOUNTER — Ambulatory Visit: Payer: Medicaid Other | Admitting: Anesthesiology

## 2022-08-02 DIAGNOSIS — F319 Bipolar disorder, unspecified: Secondary | ICD-10-CM | POA: Diagnosis not present

## 2022-08-02 DIAGNOSIS — C189 Malignant neoplasm of colon, unspecified: Secondary | ICD-10-CM

## 2022-08-02 DIAGNOSIS — Z85038 Personal history of other malignant neoplasm of large intestine: Secondary | ICD-10-CM | POA: Insufficient documentation

## 2022-08-02 DIAGNOSIS — Z980 Intestinal bypass and anastomosis status: Secondary | ICD-10-CM | POA: Insufficient documentation

## 2022-08-02 DIAGNOSIS — F4310 Post-traumatic stress disorder, unspecified: Secondary | ICD-10-CM | POA: Insufficient documentation

## 2022-08-02 DIAGNOSIS — F17200 Nicotine dependence, unspecified, uncomplicated: Secondary | ICD-10-CM | POA: Diagnosis not present

## 2022-08-02 DIAGNOSIS — Z08 Encounter for follow-up examination after completed treatment for malignant neoplasm: Secondary | ICD-10-CM | POA: Insufficient documentation

## 2022-08-02 DIAGNOSIS — F419 Anxiety disorder, unspecified: Secondary | ICD-10-CM | POA: Insufficient documentation

## 2022-08-02 HISTORY — PX: COLONOSCOPY WITH PROPOFOL: SHX5780

## 2022-08-02 SURGERY — COLONOSCOPY WITH PROPOFOL
Anesthesia: General

## 2022-08-02 MED ORDER — PROPOFOL 500 MG/50ML IV EMUL
INTRAVENOUS | Status: DC | PRN
  Administered 2022-08-02: 100 ug/kg/min via INTRAVENOUS

## 2022-08-02 MED ORDER — PROPOFOL 10 MG/ML IV BOLUS
INTRAVENOUS | Status: DC | PRN
  Administered 2022-08-02: 100 mg via INTRAVENOUS
  Administered 2022-08-02: 30 mg via INTRAVENOUS
  Administered 2022-08-02: 20 mg via INTRAVENOUS

## 2022-08-02 MED ORDER — LIDOCAINE HCL (CARDIAC) PF 100 MG/5ML IV SOSY
PREFILLED_SYRINGE | INTRAVENOUS | Status: DC | PRN
  Administered 2022-08-02: 50 mg via INTRAVENOUS

## 2022-08-02 MED ORDER — SODIUM CHLORIDE 0.9 % IV SOLN: INTRAVENOUS | Status: DC

## 2022-08-02 MED ORDER — PROPOFOL 10 MG/ML IV BOLUS
INTRAVENOUS | Status: AC
  Filled 2022-08-02: qty 20

## 2022-08-02 NOTE — Anesthesia Preprocedure Evaluation (Signed)
Anesthesia Evaluation  Patient identified by MRN, date of birth, ID band Patient awake    Reviewed: Allergy & Precautions, NPO status , Patient's Chart, lab work & pertinent test results  Airway Mallampati: III  TM Distance: >3 FB Neck ROM: full    Dental  (+) Teeth Intact   Pulmonary neg pulmonary ROS, Current Smoker and Patient abstained from smoking.   Pulmonary exam normal  + decreased breath sounds      Cardiovascular negative cardio ROS Normal cardiovascular exam Rhythm:Regular Rate:Normal     Neuro/Psych Seizures -,  PSYCHIATRIC DISORDERS Anxiety Depression Bipolar Disorder   PTSDnegative neurological ROS  negative psych ROS   GI/Hepatic negative GI ROS, Neg liver ROS,,,  Endo/Other  negative endocrine ROS    Renal/GU negative Renal ROS  negative genitourinary   Musculoskeletal   Abdominal Normal abdominal exam  (+)   Peds negative pediatric ROS (+)  Hematology negative hematology ROS (+) Blood dyscrasia, anemia   Anesthesia Other Findings Past Medical History: No date: Bipolar 1 disorder No date: Colon cancer No date: PTSD (post-traumatic stress disorder)  Past Surgical History: 07/12/2021: COLON RESECTION; N/A     Comment:  Procedure: HAND ASSISTED LAPAROSCOPIC COLON RESECTION;                Surgeon: Leafy Ro, MD;  Location: [facility] ORS;                Service: General;  Laterality: N/A; 07/06/2021: COLONOSCOPY WITH PROPOFOL; N/A     Comment:  Procedure: COLONOSCOPY WITH PROPOFOL;  Surgeon: Toney Reil, MD;  Location: [facility] ENDOSCOPY;  Service:               Gastroenterology;  Laterality: N/A; 07/07/2021: COLONOSCOPY WITH PROPOFOL; N/A     Comment:  Procedure: COLONOSCOPY WITH PROPOFOL;  Surgeon: Toney Reil, MD;  Location: [facility] ENDOSCOPY;  Service:               Gastroenterology;  Laterality: N/A; 07/06/2021: ESOPHAGOGASTRODUODENOSCOPY (EGD) WITH PROPOFOL;  N/A     Comment:  Procedure: ESOPHAGOGASTRODUODENOSCOPY (EGD) WITH               PROPOFOL;  Surgeon: Toney Reil, MD;  Location:               [facility] ENDOSCOPY;  Service: Gastroenterology;  Laterality:               N/A; 07/06/2021: GIVENS CAPSULE STUDY; N/A     Comment:  Procedure: GIVENS CAPSULE STUDY;  Surgeon: Toney Reil, MD;  Location: [facility] ENDOSCOPY;  Service:               Gastroenterology;  Laterality: N/A; 02/15/2022: IR REMOVAL TUN ACCESS W/ PORT W/O FL MOD SED 07/22/2021: PORTACATH PLACEMENT; Right     Comment:  Procedure: INSERTION PORT-A-CATH;  Surgeon: Leafy Ro, MD;  Location: [facility] ORS;  Service: General;                Laterality: Right;  BMI    Body Mass Index: 23.95 kg/m      Reproductive/Obstetrics negative OB ROS  Anesthesia Physical Anesthesia Plan  ASA: 3  Anesthesia Plan: General   Post-op Pain Management:    Induction: Intravenous  PONV Risk Score and Plan: Propofol infusion and TIVA  Airway Management Planned: Natural Airway  Additional Equipment:   Intra-op Plan:   Post-operative Plan:   Informed Consent: I have reviewed the patients History and Physical, chart, labs and discussed the procedure including the risks, benefits and alternatives for the proposed anesthesia with the patient or authorized representative who has indicated his/her understanding and acceptance.     Dental Advisory Given  Plan Discussed with: CRNA and Surgeon  Anesthesia Plan Comments:        Anesthesia Quick Evaluation

## 2022-08-02 NOTE — H&P (Signed)
Connor Repress, MD [address]  [address]  [place], [place] [address]  Main: [phone]  Fax: [phone] Pager: [phone]  Primary Care Physician:  Connor Mustache, MD Primary Gastroenterologist:  Dr. Arlyss Wilkinson  Pre-Procedure History & Physical: HPI:  Connor Wilkinson is a 37 y.o. male is here for an colonoscopy.   Past Medical History:  Diagnosis Date   Bipolar 1 disorder    Colon cancer    PTSD (post-traumatic stress disorder)     Past Surgical History:  Procedure Laterality Date   COLON RESECTION N/A 07/12/2021   Procedure: HAND ASSISTED LAPAROSCOPIC COLON RESECTION;  Surgeon: Leafy Ro, MD;  Location: [facility] ORS;  Service: General;  Laterality: N/A;   COLONOSCOPY WITH PROPOFOL N/A 07/06/2021   Procedure: COLONOSCOPY WITH PROPOFOL;  Surgeon: Toney Reil, MD;  Location: [facility] ENDOSCOPY;  Service: Gastroenterology;  Laterality: N/A;   COLONOSCOPY WITH PROPOFOL N/A 07/07/2021   Procedure: COLONOSCOPY WITH PROPOFOL;  Surgeon: Toney Reil, MD;  Location: [facility] ENDOSCOPY;  Service: Gastroenterology;  Laterality: N/A;   ESOPHAGOGASTRODUODENOSCOPY (EGD) WITH PROPOFOL N/A 07/06/2021   Procedure: ESOPHAGOGASTRODUODENOSCOPY (EGD) WITH PROPOFOL;  Surgeon: Toney Reil, MD;  Location: [facility] ENDOSCOPY;  Service: Gastroenterology;  Laterality: N/A;   GIVENS CAPSULE STUDY N/A 07/06/2021   Procedure: GIVENS CAPSULE STUDY;  Surgeon: Toney Reil, MD;  Location: [facility] ENDOSCOPY;  Service: Gastroenterology;  Laterality: N/A;   IR REMOVAL TUN ACCESS W/ PORT W/O FL MOD SED  02/15/2022   PORTACATH PLACEMENT Right 07/22/2021   Procedure: INSERTION PORT-A-CATH;  Surgeon: Leafy Ro, MD;  Location: [facility] ORS;  Service: General;  Laterality: Right;    Prior to Admission medications   Medication Sig Start Date End Date Taking? Authorizing Provider  benztropine (COGENTIN) 2 MG tablet Take 2 mg by mouth at bedtime.   Yes [provider]  busPIRone  (BUSPAR) 10 MG tablet Take 10 mg by mouth 2 (two) times daily.   Yes [provider]  dexamethasone (DECADRON) 4 MG tablet TAKE 2 TABLETS ( ) BY MOUTH ONCE DAILY *START THE DAY AFTER CHEMOTHERAPY FOR 2 DAYS. TAKE WITH FOOD. 12/12/21  Yes Creig Hines, MD  FLUoxetine (PROZAC) 20 MG capsule Take 20 mg by mouth daily.   Yes [provider]  polyethylene glycol (MIRALAX / GLYCOLAX) 17 g packet Take 17 g by mouth daily as needed (constipation). 07/15/21  Yes Wilkinson, Connor Curtis, MD  prazosin (MINIPRESS) 2 MG capsule Take 2 mg by mouth 2 (two) times daily.   Yes Wilkinson, Connor Pizza, MD  ARIPiprazole ER (ABILIFY MAINTENA) 400 MG PRSY prefilled syringe Inject 400 mg into the muscle every 21 ( twenty-one) days.    [provider]  ibuprofen (ADVIL) 600 MG tablet Take 1 tablet (600 mg total) by mouth every 6 (six) hours as needed. Patient not taking: Reported on 08/02/2022 07/15/21   Donovan Kail, PA-C  prochlorperazine (COMPAZINE) 10 MG tablet Take 1 tablet (10 mg total) by mouth every 6 (six) hours as needed (Nausea or vomiting). Patient not taking: Reported on 10/12/2021 08/03/21 12/07/21  Creig Hines, MD    Allergies as of 07/12/2022 - Review Complete 05/24/2022  Allergen Reaction Noted   Geodon [ziprasidone hcl] Anaphylaxis 07/05/2021    Family History  Problem Relation Age of Onset   Kidney cancer Father    Breast cancer Maternal Aunt     Social History   Socioeconomic History   Marital status: Single    Spouse name: Not  on file   Number of children: Not on file   Years of education: Not on file   Highest education level: Not on file  Occupational History   Not on file  Tobacco Use   Smoking status: Every Day    Packs/day: .25    Types: Cigarettes   Smokeless tobacco: Never   Tobacco comments:    1 cig a 5 days  Vaping Use   Vaping Use: Every day   Devices: elfbar  Substance and Sexual Activity   Alcohol use: Not Currently   Drug use: Not  Currently   Sexual activity: Yes  Other Topics Concern   Not on file  Social History Narrative   Not on file   Social Determinants of Health   Financial Resource Strain: Not on file  Food Insecurity: Not on file  Transportation Needs: Not on file  Physical Activity: Not on file  Stress: Not on file  Social Connections: Not on file  Intimate Partner Violence: Not on file    Review of Systems: See HPI, otherwise negative ROS  Physical Exam: BP 119/67   Pulse 72   Temp (!) 96.8 F (36 C) (Temporal)   Resp 18   Ht 6' (1.829 m)   Wt 80.1 kg   SpO2 100%   BMI 23.95 kg/m  General:   Alert,  pleasant and cooperative in NAD Head:  Normocephalic and atraumatic. Neck:  Supple; no masses or thyromegaly. Lungs:  Clear throughout to auscultation.    Heart:  Regular rate and rhythm. Abdomen:  Soft, nontender and nondistended. Normal bowel sounds, without guarding, and without rebound.   Neurologic:  Alert and  oriented x4;  grossly normal neurologically.  Impression/Plan: Connor Hensley is here for an colonoscopy to be performed for h/o colon cancer  Risks, benefits, limitations, and alternatives regarding  colonoscopy have been reviewed with the patient.  Questions have been answered.  All parties agreeable.   Lannette Donath, MD  08/02/2022, 9:20 AM

## 2022-08-02 NOTE — Op Note (Signed)
[facility] Gastroenterology Patient Name: Connor Wilkinson Procedure Date: 08/02/2022 10:16 AM MRN: [id number] Account #: [id number] Date of Birth: 07/17/1985 Admit Type: Outpatient Age: 37 Room: [facility] ENDO ROOM 4 Gender: Male Note Status: Finalized Instrument Name: Colonoscope [id number] Procedure:             Colonoscopy Indications:           High risk colon cancer surveillance: Personal history                         of colon cancer, Last colonoscopy: March 2023 Providers:             Toney Reil MD, MD Referring MD:          [name], MD (Referring MD) Medicines:             General Anesthesia Complications:         No immediate complications. Estimated blood loss: None. Procedure:             Pre-Anesthesia Assessment:                        - Prior to the procedure, a History and Physical was                         performed, and patient medications and allergies were                         reviewed. The patient is competent. The risks and                         benefits of the procedure and the sedation options and                         risks were discussed with the patient. All questions                         were answered and informed consent was obtained.                         Patient identification and proposed procedure were                         verified by the physician, the nurse, the                         anesthesiologist, the anesthetist and the technician                         in the pre-procedure area in the procedure room in the                         endoscopy suite. Mental Status Examination: alert and                         oriented. Airway Examination: normal oropharyngeal                         airway and neck mobility. Respiratory Examination:  clear to auscultation. CV Examination: normal.                         Prophylactic Antibiotics: The patient does not require                          prophylactic antibiotics. Prior Anticoagulants: The                         patient has taken no anticoagulant or antiplatelet                         agents. ASA Grade Assessment: III - A patient with                         severe systemic disease. After reviewing the risks and                         benefits, the patient was deemed in satisfactory                         condition to undergo the procedure. The anesthesia                         plan was to use general anesthesia. Immediately prior                         to administration of medications, the patient was                         re-assessed for adequacy to receive sedatives. The                         heart rate, respiratory rate, oxygen saturations,                         blood pressure, adequacy of pulmonary ventilation, and                         response to care were monitored throughout the                         procedure. The physical status of the patient was                         re-assessed after the procedure.                        After obtaining informed consent, the colonoscope was                         passed under direct vision. Throughout the procedure,                         the patient's blood pressure, pulse, and oxygen                         saturations were monitored continuously. The  Colonoscope was introduced through the anus and                         advanced to the the ileocolonic anastomosis. The                         colonoscopy was performed with moderate difficulty due                         to poor bowel prep and significant looping. Successful                         completion of the procedure was aided by applying                         abdominal pressure. The patient tolerated the                         procedure well. The quality of the bowel preparation                         was poor. The ileocecal valve, appendiceal orifice,                          and rectum were photographed. Findings:      The perianal and digital rectal examinations were normal. Pertinent       negatives include normal sphincter tone and no palpable rectal lesions.      There was evidence of a prior end-to-side ileo-colonic anastomosis in       the transverse colon. This was patent and was characterized by mild       stenosis. The anastomosis could not be traversed.      Copious quantities of semi-liquid stool was found in the entire colon,       precluding visualization.      The retroflexed view of the distal rectum and anal verge was normal and       showed no anal or rectal abnormalities. Impression:            - Preparation of the colon was poor.                        - Patent end-to-side ileo-colonic anastomosis,                         characterized by mild stenosis.                        - Stool in the entire examined colon.                        - The distal rectum and anal verge are normal on                         retroflexion view.                        - No specimens collected. Recommendation:        - Discharge patient to home (with escort).                        -  Resume previous diet today.                        - Continue present medications.                        - Repeat colonoscopy in 6 months with 2 day prep                         because the bowel preparation was suboptimal. Procedure Code(s):     --- Professional ---                        W0981, Colorectal cancer screening; colonoscopy on                         individual at high risk Diagnosis Code(s):     --- Professional ---                        X91.478, Personal history of other malignant neoplasm                         of large intestine                        Z98.0, Intestinal bypass and anastomosis status CPT copyright 2022 American Medical Association. All rights reserved. The codes documented in this report are preliminary and upon coder review may  be  revised to meet current compliance requirements. Dr. Libby Maw Toney Reil MD, MD 08/02/2022 10:38:34 AM This report has been signed electronically. Number of Addenda: 0 Note Initiated On: 08/02/2022 10:16 AM Scope Withdrawal Time: 0 hours 5 minutes 47 seconds  Total Procedure Duration: 0 hours 10 minutes 22 seconds  Estimated Blood Loss:  Estimated blood loss: none.      [facility]

## 2022-08-02 NOTE — Transfer of Care (Signed)
Immediate Anesthesia Transfer of Care Note  Patient: Connor Wilkinson  Procedure(s) Performed: COLONOSCOPY WITH PROPOFOL  Patient Location: PACU and Endoscopy Unit  Anesthesia Type:General  Level of Consciousness: unresponsive  Airway & Oxygen Therapy: Patient Spontanous Breathing  Post-op Assessment: Report given to RN and Post -op Vital signs reviewed and stable  Post vital signs: Reviewed and stable  Last Vitals:  Vitals Value Taken Time  BP 94/68 08/02/22 1039  Temp    Pulse 62 08/02/22 1039  Resp    SpO2 100 % 08/02/22 1039  Vitals shown include unvalidated device data.  Last Pain:  Vitals:   08/02/22 0855  TempSrc: Temporal  PainSc: 0-No pain         Complications: No notable events documented.

## 2022-08-02 NOTE — Anesthesia Postprocedure Evaluation (Signed)
Anesthesia Post Note  Patient: Connor Wilkinson  Procedure(s) Performed: COLONOSCOPY WITH PROPOFOL  Patient location during evaluation: PACU Anesthesia Type: General Level of consciousness: awake and oriented Pain management: pain level controlled Vital Signs Assessment: post-procedure vital signs reviewed and stable Respiratory status: spontaneous breathing and respiratory function stable Cardiovascular status: stable Anesthetic complications: no   No notable events documented.   Last Vitals:  Vitals:   08/02/22 0855 08/02/22 1039  BP: 119/67 94/68  Pulse: 72 61  Resp: 18   Temp: (!) 36 C   SpO2: 100% 100%    Last Pain:  Vitals:   08/02/22 0855  TempSrc: Temporal  PainSc: 0-No pain                 VAN STAVEREN,[name]

## 2022-08-03 ENCOUNTER — Encounter: Payer: Self-pay | Admitting: Gastroenterology

## 2022-09-16 DIAGNOSIS — R911 Solitary pulmonary nodule: Secondary | ICD-10-CM

## 2022-09-16 HISTORY — DX: Solitary pulmonary nodule: R91.1

## 2022-09-19 ENCOUNTER — Ambulatory Visit
Admission: RE | Admit: 2022-09-19 | Discharge: 2022-09-19 | Disposition: A | Discharge status: Home / Self Care | Payer: Medicaid Other | Source: Ambulatory Visit | Attending: Oncology | Admitting: Oncology

## 2022-09-19 ENCOUNTER — Telehealth: Payer: Self-pay | Admitting: *Deleted

## 2022-09-19 ENCOUNTER — Other Ambulatory Visit: Payer: Self-pay | Admitting: *Deleted

## 2022-09-19 DIAGNOSIS — R9389 Abnormal findings on diagnostic imaging of other specified body structures: Secondary | ICD-10-CM

## 2022-09-19 DIAGNOSIS — C189 Malignant neoplasm of colon, unspecified: Secondary | ICD-10-CM

## 2022-09-19 DIAGNOSIS — Z85038 Personal history of other malignant neoplasm of large intestine: Secondary | ICD-10-CM | POA: Insufficient documentation

## 2022-09-19 DIAGNOSIS — Z08 Encounter for follow-up examination after completed treatment for malignant neoplasm: Secondary | ICD-10-CM | POA: Diagnosis present

## 2022-09-19 MED ORDER — BARIUM SULFATE 2 % PO SUSP
450.0000 mL | Freq: Once | ORAL | Status: AC
  Administered 2022-09-19: 900 mL via ORAL

## 2022-09-19 MED ORDER — IOHEXOL 300 MG/ML  SOLN
100.0000 mL | Freq: Once | INTRAMUSCULAR | Status: AC | PRN
  Administered 2022-09-19: 100 mL via INTRAVENOUS

## 2022-09-19 NOTE — Telephone Encounter (Signed)
Called report  IMPRESSION: 1. New 15 mm left lower lobe pulmonary nodule and 5 mm subpleural left lower lobe pulmonary nodule, concerning for metastatic disease. 2. New 7 mm left adrenal nodule, indeterminate but suspicious for metastatic disease. Suggest further evaluation with nuclear medicine PET-CT. 3. Prior right hemicolectomy with ileocolic anastomosis. No suspicious nodularity along the suture line.   These results will be called to the ordering clinician or representative by the Radiologist Assistant, and communication documented in the PACS or [employer].     Electronically Signed   By: Maudry Mayhew M.D.   On: 09/19/2022 15:01

## 2022-09-19 NOTE — Progress Notes (Signed)
Needs pet

## 2022-09-20 ENCOUNTER — Telehealth: Payer: Self-pay | Admitting: *Deleted

## 2022-09-20 NOTE — Telephone Encounter (Signed)
I had called [name] with [facility] and I left message that we would like to get pet scan because of 2 nodules on ct scan and now needs PET scan. I called both people at assisted living and both phones were full and can' t leave a message. [name] called later today and she said  that she would get in touch with them. They called me today and I told I'm the pet scan is 6/13  at 1:30 be there at 30 min. Early. NPO for 6 hours prior to the test. He can have water. They understood the instructions.

## 2022-09-21 ENCOUNTER — Other Ambulatory Visit: Payer: Self-pay

## 2022-09-25 ENCOUNTER — Inpatient Hospital Stay (HOSPITAL_BASED_OUTPATIENT_CLINIC_OR_DEPARTMENT_OTHER): Payer: Medicaid Other | Admitting: Oncology

## 2022-09-25 ENCOUNTER — Encounter: Payer: Self-pay | Admitting: Oncology

## 2022-09-25 ENCOUNTER — Other Ambulatory Visit: Payer: Medicaid Other

## 2022-09-25 ENCOUNTER — Inpatient Hospital Stay: Payer: Medicaid Other | Attending: Oncology

## 2022-09-25 VITALS — BP 129/84 | HR 90 | Temp 96.6°F | Resp 18 | Ht 72.0 in | Wt 186.0 lb

## 2022-09-25 DIAGNOSIS — E279 Disorder of adrenal gland, unspecified: Secondary | ICD-10-CM | POA: Diagnosis not present

## 2022-09-25 DIAGNOSIS — Z08 Encounter for follow-up examination after completed treatment for malignant neoplasm: Secondary | ICD-10-CM | POA: Diagnosis not present

## 2022-09-25 DIAGNOSIS — Z8051 Family history of malignant neoplasm of kidney: Secondary | ICD-10-CM | POA: Insufficient documentation

## 2022-09-25 DIAGNOSIS — R9389 Abnormal findings on diagnostic imaging of other specified body structures: Secondary | ICD-10-CM | POA: Diagnosis not present

## 2022-09-25 DIAGNOSIS — Z85038 Personal history of other malignant neoplasm of large intestine: Secondary | ICD-10-CM

## 2022-09-25 DIAGNOSIS — C189 Malignant neoplasm of colon, unspecified: Secondary | ICD-10-CM | POA: Diagnosis present

## 2022-09-25 DIAGNOSIS — Z923 Personal history of irradiation: Secondary | ICD-10-CM | POA: Insufficient documentation

## 2022-09-25 DIAGNOSIS — Z803 Family history of malignant neoplasm of breast: Secondary | ICD-10-CM | POA: Insufficient documentation

## 2022-09-25 DIAGNOSIS — R599 Enlarged lymph nodes, unspecified: Secondary | ICD-10-CM | POA: Insufficient documentation

## 2022-09-25 DIAGNOSIS — F17210 Nicotine dependence, cigarettes, uncomplicated: Secondary | ICD-10-CM | POA: Diagnosis not present

## 2022-09-25 DIAGNOSIS — R918 Other nonspecific abnormal finding of lung field: Secondary | ICD-10-CM | POA: Insufficient documentation

## 2022-09-25 LAB — COMPREHENSIVE METABOLIC PANEL
ALT: 32 U/L (ref 0–44)
AST: 31 U/L (ref 15–41)
Albumin: 4.3 g/dL (ref 3.5–5.0)
Alkaline Phosphatase: 66 U/L (ref 38–126)
Anion gap: 4 — ABNORMAL LOW (ref 5–15)
BUN: 14 mg/dL (ref 6–20)
CO2: 25 mmol/L (ref 22–32)
Calcium: 8.4 mg/dL — ABNORMAL LOW (ref 8.9–10.3)
Chloride: 107 mmol/L (ref 98–111)
Creatinine, Ser: 0.9 mg/dL (ref 0.61–1.24)
GFR, Estimated: >60 mL/min (ref >60)
Glucose, Bld: 108 mg/dL — ABNORMAL HIGH (ref 70–99)
Potassium: 4 mmol/L (ref 3.5–5.1)
Sodium: 136 mmol/L (ref 135–145)
Total Bilirubin: 0.4 mg/dL (ref 0.3–1.2)
Total Protein: 7 g/dL (ref 6.5–8.1)

## 2022-09-25 LAB — CBC WITH DIFFERENTIAL/PLATELET
Abs Immature Granulocytes: 0.01 10*3/uL (ref 0.00–0.07)
Basophils Absolute: 0 10*3/uL (ref 0.0–0.1)
Basophils Relative: 1 %
Eosinophils Absolute: 0.2 10*3/uL (ref 0.0–0.5)
Eosinophils Relative: 3 %
HCT: 44.4 % (ref 39.0–52.0)
Hemoglobin: 14.9 g/dL (ref 13.0–17.0)
Immature Granulocytes: 0 %
Lymphocytes Relative: 26 %
Lymphs Abs: 1.6 10*3/uL (ref 0.7–4.0)
MCH: 30.8 pg (ref 26.0–34.0)
MCHC: 33.6 g/dL (ref 30.0–36.0)
MCV: 91.7 fL (ref 80.0–100.0)
Monocytes Absolute: 0.4 10*3/uL (ref 0.1–1.0)
Monocytes Relative: 6 %
Neutro Abs: 3.8 10*3/uL (ref 1.7–7.7)
Neutrophils Relative %: 64 %
Platelets: 199 10*3/uL (ref 150–400)
RBC: 4.84 MIL/uL (ref 4.22–5.81)
RDW: 13 % (ref 11.5–15.5)
WBC: 5.9 10*3/uL (ref 4.0–10.5)
nRBC: 0 % (ref 0.0–0.2)

## 2022-09-25 NOTE — Progress Notes (Signed)
Hematology/Oncology Consult note [facility]  [phone] [phone]  Patient Care Team: [name], MD as PCP - General (Internal Medicine) Creig Hines, MD as Consulting Physician (Hematology and Oncology) Leafy Ro, MD as Consulting Physician (General Surgery) Toney Reil, MD as Consulting Physician (Gastroenterology) Benita Gutter, RN as Oncology Nurse Navigator   Name of the patient: Connor Wilkinson  [id number]  1985/08/17   Date of visit: 09/25/22  Diagnosis-  stage IIIC adenocarcinoma of the colon [id number]   Chief complaint/ Reason for visit-discuss CT scan results and further management  Heme/Onc history: patient is a 37 year old male with a past medical history significant for mild bipolar disorder who is a ward of the stateAnd was admitted to the hospital for severe anemia.  He was found to have a hemoglobin of 4.5/18.7 on admission.  He received blood transfusion as well as IV iron and his hemoglobin is presently up to an 8.  He underwent colonoscopy which showed malignant partially obstructing tumor that was circumferential 80 cm proximal to the anus.  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  CT chest abdomen and pelvis with contrast did not show any evidence of distant metastatic disease.  2 small sclerotic densities in the acetabulum and proximal right femur likely benign process.  Pathologically enlarged lymph nodes superior to circumferential colonic lesion suggesting metastatic adenopathy.   Patient underwent right hemicolectomy with Dr. Everlene Farrier on 07/12/2021.  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  Vermiform appendix negative for tumor.  Radial margin positive for tumor (2 lymph nodes at margin with  adenocarcinoma).  Proximal and distal margins negative.  7 out of 56 lymph nodes positive for metastatic adenocarcinoma.  Tumor deposits not applicable.  No lymphovascular or perineural invasion seen.  [id number]   Fertility conservation was also discussed an option of sperm banking at [facility] was discussed with the patient and his healthcare power of attorney [name].  There would be a one-time cost involved for sperm banking and a yearly cost for preserving the specimen.  Overall chances of infertility with FOLFOX is low but possible.  Patient and his healthcare power of attorney have discussed their options and have decided not to proceed with fertility conservation at this time   Patient completed 12 cycles of adjuvant FOLFOX chemotherapy in followed by adjuvant radiation therapy in September 2023  Interval history-patient is doing well overall and denies any specific complaints at this time  ECOG PS- 0 Pain scale- 0   Review of systems- Review of Systems  Constitutional:  Negative for chills, fever, malaise/fatigue and weight loss.  HENT:  Negative for congestion, ear discharge and nosebleeds.   Eyes:  Negative for blurred vision.  Respiratory:  Negative for cough, hemoptysis, sputum production, shortness of breath and wheezing.   Cardiovascular:  Negative for chest pain, palpitations, orthopnea and claudication.  Gastrointestinal:  Negative for abdominal pain, blood in stool, constipation, diarrhea, heartburn, melena, nausea and vomiting.  Genitourinary:  Negative for dysuria, flank pain, frequency, hematuria and urgency.  Musculoskeletal:  Negative for back pain, joint pain and myalgias.  Skin:  Negative for rash.  Neurological:  Negative for dizziness, tingling, focal weakness, seizures, weakness and headaches.  Endo/Heme/Allergies:  Does not bruise/bleed easily.  Psychiatric/Behavioral:  Negative for depression and suicidal ideas. The patient does not have insomnia.  Allergies   Allergen Reactions   Geodon [Ziprasidone Hcl] Anaphylaxis     Past Medical History:  Diagnosis Date   Bipolar 1 disorder (HCC)    Colon cancer (HCC)    PTSD (post-traumatic stress disorder)      Past Surgical History:  Procedure Laterality Date   COLON RESECTION N/A 07/12/2021   Procedure: HAND ASSISTED LAPAROSCOPIC COLON RESECTION;  Surgeon: Leafy Ro, MD;  Location: [facility] ORS;  Service: General;  Laterality: N/A;   COLONOSCOPY WITH PROPOFOL N/A 07/06/2021   Procedure: COLONOSCOPY WITH PROPOFOL;  Surgeon: Toney Reil, MD;  Location: [facility] ENDOSCOPY;  Service: Gastroenterology;  Laterality: N/A;   COLONOSCOPY WITH PROPOFOL N/A 07/07/2021   Procedure: COLONOSCOPY WITH PROPOFOL;  Surgeon: Toney Reil, MD;  Location: [facility] ENDOSCOPY;  Service: Gastroenterology;  Laterality: N/A;   COLONOSCOPY WITH PROPOFOL N/A 08/02/2022   Procedure: COLONOSCOPY WITH PROPOFOL;  Surgeon: Toney Reil, MD;  Location: [facility] ENDOSCOPY;  Service: Gastroenterology;  Laterality: N/AEarvin Hansen (Caregiver & Transporter) [phone] [name] [phone] will be available for phone consent   ESOPHAGOGASTRODUODENOSCOPY (EGD) WITH PROPOFOL N/A 07/06/2021   Procedure: ESOPHAGOGASTRODUODENOSCOPY (EGD) WITH PROPOFOL;  Surgeon: Toney Reil, MD;  Location: [facility] ENDOSCOPY;  Service: Gastroenterology;  Laterality: N/A;   GIVENS CAPSULE STUDY N/A 07/06/2021   Procedure: GIVENS CAPSULE STUDY;  Surgeon: Toney Reil, MD;  Location: [facility] ENDOSCOPY;  Service: Gastroenterology;  Laterality: N/A;   IR REMOVAL TUN ACCESS W/ PORT W/O FL MOD SED  02/15/2022   PORTACATH PLACEMENT Right 07/22/2021   Procedure: INSERTION PORT-A-CATH;  Surgeon: Leafy Ro, MD;  Location: [facility] ORS;  Service: General;  Laterality: Right;    Social History   Socioeconomic History   Marital status: Single    Spouse name: Not on file   Number of children: Not on file   Years of education: Not on file    Highest education level: Not on file  Occupational History   Not on file  Tobacco Use   Smoking status: Every Day    Packs/day: .25    Types: Cigarettes   Smokeless tobacco: Never   Tobacco comments:    1 cig a 5 days  Vaping Use   Vaping Use: Every day   Devices: elfbar  Substance and Sexual Activity   Alcohol use: Not Currently   Drug use: Not Currently   Sexual activity: Yes  Other Topics Concern   Not on file  Social History Narrative   Not on file   Social Determinants of Health   Financial Resource Strain: Not on file  Food Insecurity: Not on file  Transportation Needs: Not on file  Physical Activity: Not on file  Stress: Not on file  Social Connections: Not on file  Intimate Partner Violence: Not on file    Family History  Problem Relation Age of Onset   Kidney cancer Father    Breast cancer Maternal Aunt      Current Outpatient Medications:    ARIPiprazole ER (ABILIFY MAINTENA) 400 MG PRSY prefilled syringe, Inject 400 mg into the muscle every 21 ( twenty-one) days., Disp: , Rfl:    benztropine (COGENTIN) 2 MG tablet, Take 2 mg by mouth at bedtime., Disp: , Rfl:    busPIRone (BUSPAR) 10 MG tablet, Take 10 mg by mouth 2 (two) times daily., Disp: , Rfl:    dexamethasone (DECADRON) 4 MG tablet, TAKE 2 TABLETS (8MG ) BY MOUTH ONCE DAILY *START THE DAY AFTER CHEMOTHERAPY FOR 2 DAYS. TAKE  WITH FOOD., Disp: 30 tablet, Rfl: 10   FLUoxetine (PROZAC) 20 MG capsule, Take 20 mg by mouth daily., Disp: , Rfl:    polyethylene glycol (MIRALAX / GLYCOLAX) 17 g packet, Take 17 g by mouth daily as needed (constipation)., Disp: 14 each, Rfl: 0   prazosin (MINIPRESS) 2 MG capsule, Take 2 mg by mouth 2 (two) times daily., Disp: , Rfl:   Physical exam:  Vitals:   09/25/22 1339  BP: 129/84  Pulse: 90  Resp: 18  Temp: (!) 96.6 F (35.9 C)  TempSrc: Tympanic  SpO2: 94%  Weight: 186 lb (84.4 kg)  Height: 6' (1.829 m)   Physical Exam Cardiovascular:     Rate and Rhythm:  Normal rate and regular rhythm.     Heart sounds: Normal heart sounds.  Pulmonary:     Effort: Pulmonary effort is normal.     Breath sounds: Normal breath sounds.  Abdominal:     General: Bowel sounds are normal.     Palpations: Abdomen is soft.  Skin:    General: Skin is warm and dry.  Neurological:     Mental Status: He is alert and oriented to person, place, and time.         Latest Ref Rng & Units 09/25/2022    1:34 PM  CMP  Glucose 70 - 99 mg/dL 409   BUN 6 - 20 mg/dL 14   Creatinine 8.11 - 1.24 mg/dL 9.14   Sodium 782 - 956 mmol/L 136   Potassium 3.5 - 5.1 mmol/L 4.0   Chloride 98 - 111 mmol/L 107   CO2 22 - 32 mmol/L 25   Calcium 8.9 - 10.3 mg/dL 8.4   Total Protein 6.5 - 8.1 g/dL 7.0   Total Bilirubin 0.3 - 1.2 mg/dL 0.4   Alkaline Phos 38 - 126 U/L 66   AST 15 - 41 U/L 31   ALT 0 - 44 U/L 32       Latest Ref Rng & Units 09/25/2022    1:34 PM  CBC  WBC 4.0 - 10.5 K/uL 5.9   Hemoglobin 13.0 - 17.0 g/dL 21.3   Hematocrit 08.6 - 52.0 % 44.4   Platelets 150 - 400 K/uL 199     No images are attached to the encounter.  CT CHEST ABDOMEN PELVIS W CONTRAST  Result Date: 09/19/2022 CLINICAL DATA:  History of colon cancer, follow-up. * Tracking Code: BO * EXAM: CT CHEST, ABDOMEN, AND PELVIS WITH CONTRAST TECHNIQUE: Multidetector CT imaging of the chest, abdomen and pelvis was performed following the standard protocol during bolus administration of intravenous contrast. RADIATION DOSE REDUCTION: This exam was performed according to the departmental dose-optimization program which includes automated exposure control, adjustment of the mA and/or kV according to patient size and/or use of iterative reconstruction technique. CONTRAST:  OMNIPAQUE IOHEXOL 300 MG/ML  SOLN COMPARISON:  Multiple priors including CT January 16, 2022 and February 17, 2022 FINDINGS: CT CHEST FINDINGS Cardiovascular: Normal caliber thoracic aorta. No central pulmonary embolus on this nondedicated  study. Normal size heart. No significant pericardial effusion/thickening. Mediastinum/Nodes: No supraclavicular adenopathy. No suspicious thyroid nodule. No pathologically enlarged mediastinal, hilar or axillary lymph nodes. Esophagus is grossly unremarkable. Lungs/Pleura: New 15 mm left lower lobe pulmonary nodule on image 111/3. New subpleural left lower lobe pulmonary nodule measuring 5 mm on image 107/3. No pleural effusion. No pneumothorax. Musculoskeletal: No aggressive lytic or blastic lesion of bone. CT ABDOMEN PELVIS FINDINGS Hepatobiliary: No suspicious hepatic lesion. Gallbladder is unremarkable.  No biliary ductal dilation. Pancreas: No pancreatic ductal dilation or evidence of acute inflammation. Spleen: No splenomegaly. Adrenals/Urinary Tract: New left adrenal nodule measures 7 mm on image 64/2. Right adrenal gland appears normal. No hydronephrosis. Kidneys demonstrate symmetric enhancement. Urinary bladder is unremarkable for degree of distension. Stomach/Bowel: Radiopaque enteric contrast material traverses the sigmoid colon. Stomach is unremarkable for degree of distension. No pathologic dilation of small or large bowel. Prior right hemicolectomy with ileocolic anastomosis. No suspicious nodularity along the suture line. Vascular/Lymphatic: Normal caliber abdominal aorta. Smooth IVC contours. The portal, splenic and superior mesenteric veins are patent. No pathologically enlarged abdominal or pelvic lymph nodes. Reproductive: Prostate is unremarkable. Other: No significant abdominopelvic free fluid. No discrete peritoneal or omental nodularity. Musculoskeletal: No aggressive lytic or blastic lesion of bone. IMPRESSION: 1. New 15 mm left lower lobe pulmonary nodule and 5 mm subpleural left lower lobe pulmonary nodule, concerning for metastatic disease. 2. New 7 mm left adrenal nodule, indeterminate but suspicious for metastatic disease. Suggest further evaluation with nuclear medicine PET-CT. 3. Prior  right hemicolectomy with ileocolic anastomosis. No suspicious nodularity along the suture line. These results will be called to the ordering clinician or representative by the Radiologist Assistant, and communication documented in the PACS or [employer]. Electronically Signed   By: Maudry Mayhew M.D.   On: 09/19/2022 15:01     Assessment and plan- Patient is a 37 y.o. male with history of stage III colon cancer s/p 12 cycles of adjuvant FOLFOX chemotherapy and radiation therapy.  He is here to discuss CT scan results and further management  I have reviewed CT chest abdomen pelvis images independently and discussed findings with the patient.  Have also shown him the images today in my clinic.  Patient has 2 lung nodules of concern 1 situated more medially and measuring 15 mm in the left lower lobe and a second subpleural nodule measuring 5 mm.  There is also a 7 mm new left adrenal nodule which looks concerning.  I am planning to get a PET scan later this week.  We will discuss his case at tumor board and I will consider referring him to pulmonary for potential bronchoscopy of the 15 mm lesion.  Follow-up with me to be decided based on biopsy and pathology findings   Visit Diagnosis 1. Encounter for follow-up surveillance of colon cancer   2. Abnormal CT scan      Dr. Owens Shark, MD, MPH [facility] at [facility] [phone] 09/25/2022 2:21 PM

## 2022-09-27 ENCOUNTER — Other Ambulatory Visit: Payer: Self-pay

## 2022-09-27 LAB — CEA: CEA: 7.6 ng/mL — ABNORMAL HIGH (ref 0.0–4.7)

## 2022-09-28 ENCOUNTER — Ambulatory Visit (INDEPENDENT_AMBULATORY_CARE_PROVIDER_SITE_OTHER): Payer: Medicaid Other | Admitting: Student in an Organized Health Care Education/Training Program

## 2022-09-28 ENCOUNTER — Encounter: Payer: Self-pay | Admitting: Student in an Organized Health Care Education/Training Program

## 2022-09-28 ENCOUNTER — Other Ambulatory Visit: Payer: Medicaid Other

## 2022-09-28 ENCOUNTER — Other Ambulatory Visit: Payer: Self-pay | Admitting: Student in an Organized Health Care Education/Training Program

## 2022-09-28 ENCOUNTER — Ambulatory Visit
Admission: RE | Admit: 2022-09-28 | Discharge: 2022-09-28 | Disposition: A | Discharge status: Home / Self Care | Payer: Medicaid Other | Source: Ambulatory Visit | Attending: Oncology | Admitting: Oncology

## 2022-09-28 VITALS — BP 126/82 | HR 87 | Temp 97.5°F | Ht 72.0 in | Wt 187.0 lb

## 2022-09-28 DIAGNOSIS — R911 Solitary pulmonary nodule: Secondary | ICD-10-CM | POA: Insufficient documentation

## 2022-09-28 DIAGNOSIS — C189 Malignant neoplasm of colon, unspecified: Secondary | ICD-10-CM | POA: Diagnosis present

## 2022-09-28 DIAGNOSIS — Z9049 Acquired absence of other specified parts of digestive tract: Secondary | ICD-10-CM | POA: Insufficient documentation

## 2022-09-28 DIAGNOSIS — R9389 Abnormal findings on diagnostic imaging of other specified body structures: Secondary | ICD-10-CM

## 2022-09-28 LAB — GLUCOSE, CAPILLARY: Glucose-Capillary: 91 mg/dL (ref 70–99)

## 2022-09-28 MED ORDER — FLUDEOXYGLUCOSE F - 18 (FDG) INJECTION
9.6000 | Freq: Once | INTRAVENOUS | Status: AC | PRN
  Administered 2022-09-28: 10.09 via INTRAVENOUS

## 2022-09-28 NOTE — Progress Notes (Signed)
Assessment & Plan:   1. Adenocarcinoma of colon (HCC) 2. 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  There was also a 5 mm left lower lobe subpleural nodule.  Given his history of colon cancer and finding of adrenal nodule as well as enlarged lymph node in the abdomen, metastatic disease is on the differential.  Patient is presenting for consideration of biopsy of said nodule.  We discussed the importance of diagnosis and staging in lung malignancies, and the approach to obtaining a tissue diagnosis which would include robotic assisted navigational bronchoscopy with endobronchial ultrasound guided sampling.  We also discussed the risks associated with the procedure which include a 2% risk of pneumothorax, infection, bleeding, and nondiagnostic procedure in detail.  I explained that patients typically are able to return home the same day of the procedure, but in rare cases admission to the hospital for observation and treatment is required.  After our discussion, the patient elected to proceed with the procedure  Recommendations: Robotic Assisted navigational bronchoscopy to LLL nodule with EBUS    Return if symptoms worsen or fail to improve.  I spent 60 minutes caring for this patient today, including preparing to see the patient, obtaining a medical history , reviewing a separately obtained history, performing a medically appropriate examination and/or evaluation, counseling and educating the patient/family/caregiver, ordering medications, tests, or procedures, documenting clinical information in the electronic health record, and independently interpreting results (not separately reported/billed) and communicating results to the patient/family/caregiver  Raechel Chute, MD [facility] 09/28/2022 1:17 PM    End of visit medications:  No orders of the defined types were placed in this encounter.    Current Outpatient Medications:    ARIPiprazole ER (ABILIFY MAINTENA) 400 MG PRSY prefilled syringe, Inject 400 mg into the muscle every 21 ( twenty-one) days., Disp: , Rfl:    benztropine (COGENTIN) 2 MG tablet, Take 2 mg by mouth at bedtime., Disp: , Rfl:    busPIRone (BUSPAR) 10 MG tablet, Take 10 mg by mouth 2 (two) times daily., Disp: , Rfl:    FLUoxetine (PROZAC) 20 MG capsule, Take 20 mg by mouth daily., Disp: , Rfl:    polyethylene glycol (MIRALAX / GLYCOLAX) 17 g packet, Take 17 g by mouth daily as needed (constipation)., Disp: 14 each, Rfl: 0   prazosin (MINIPRESS) 2 MG capsule, Take 2 mg by mouth 2 (two) times daily., Disp: , Rfl:    dexamethasone (DECADRON) 4 MG tablet, TAKE 2 TABLETS (8MG ) BY MOUTH ONCE DAILY *START THE DAY AFTER CHEMOTHERAPY FOR 2 DAYS. TAKE WITH FOOD. (Patient not taking: Reported on 09/28/2022), Disp: 30 tablet, Rfl: 10   Subjective:   PATIENT ID: Connor Wilkinson GENDER: male DOB: 03-23-1986, MRN: [id number]  Chief Complaint  Patient presents with   Consult    Lung nodule.     HPI  The patient is a pleasant 37 year old male presenting to clinic for the evaluation of a pulmonary nodule.  Patient has been followed closely by Dr. Smith Robert from oncology for adenocarcinoma of the colon.  Patient was diagnosed with moderately differentiated colonic adenocarcinoma (stage III) after colonoscopy was performed for the evaluation of anemia.  He underwent right hemicolectomy in March 2023 with eventual staging pathologic stage T4a N2b.  He then completed 12  cycles of adjuvant FOLFOX chemotherapy followed by adjuvant radiation therapy in September 2023.  Surveillance imaging this year is notable for 2 pulmonary nodules in the left lower lobe, the largest of which is abutting the fissure measuring up to 15 mm.   There was also concern for necrotic appearing nodule in the abdomen as well as a 7 mm left adrenal nodule.  Patient is referred to pulmonary for further evaluation and possible biopsy of the pulmonary nodule.  He overall feels in his usual state of health and has no complaints.  He has no respiratory symptoms and denies any shortness of breath, cough, chest pain, chest tightness, fevers, chills, night sweats, or weight loss.  Given this was found on surveillance imaging, patient is also scheduled for a PET/CT for today.  Patient reports history of smoking cigarettes, and is currently smoking between 4 and 5 cigarettes a day.  He also reports using a nicotine vape.  He lives at a group home.  Ancillary information including prior medications, full medical/surgical/family/social histories, and PFTs (when available) are listed below and have been reviewed.   Review of Systems  Constitutional:  Negative for chills, fever, malaise/fatigue and weight loss.  Respiratory:  Negative for cough, hemoptysis, sputum production, shortness of breath and wheezing.   Cardiovascular:  Negative for chest pain.     Objective:   Vitals:   09/28/22 0836  BP: 126/82  Pulse: 87  Temp: (!) 97.5 F (36.4 C)  SpO2: 97%  Weight: 187 lb (84.8 kg)  Height: 6' (1.829 m)   97% on RA BMI Readings from Last 3 Encounters:  09/28/22 25.36 kg/m  09/25/22 25.23 kg/m  08/02/22 23.95 kg/m   Wt Readings from Last 3 Encounters:  09/28/22 187 lb (84.8 kg)  09/25/22 186 lb (84.4 kg)  08/02/22 176 lb 9.6 oz (80.1 kg)    Physical Exam Constitutional:      General: He is not in acute distress.    Appearance: Normal appearance. He is not ill-appearing.  HENT:     Head: Normocephalic.     Mouth/Throat:     Mouth: Mucous membranes are moist.  Eyes:     Extraocular Movements: Extraocular movements intact.  Cardiovascular:     Rate and Rhythm: Normal rate and regular rhythm.     Pulses: Normal pulses.     Heart  sounds: Normal heart sounds.  Pulmonary:     Effort: Pulmonary effort is normal.     Breath sounds: Normal breath sounds. No wheezing or rales.  Abdominal:     General: Abdomen is flat.     Palpations: Abdomen is soft.  Musculoskeletal:     Right lower leg: No edema.     Left lower leg: No edema.  Neurological:     General: No focal deficit present.     Mental Status: He is alert and oriented to person, place, and time. Mental status is at baseline.     Ancillary Information    Past Medical History:  Diagnosis Date   Bipolar 1 disorder (HCC)    Colon cancer (HCC)    PTSD (post-traumatic stress disorder)      Family History  Problem Relation Age of Onset   Kidney cancer Father    Breast cancer Maternal Aunt      Past Surgical History:  Procedure Laterality Date   COLON RESECTION N/A 07/12/2021   Procedure: HAND ASSISTED LAPAROSCOPIC COLON RESECTION;  Surgeon: Leafy Ro, MD;  Location: [facility] ORS;  Service: General;  Laterality: N/A;   COLONOSCOPY WITH PROPOFOL N/A 07/06/2021   Procedure: COLONOSCOPY WITH PROPOFOL;  Surgeon: Toney Reil, MD;  Location: [facility] ENDOSCOPY;  Service: Gastroenterology;  Laterality: N/A;   COLONOSCOPY WITH PROPOFOL N/A 07/07/2021   Procedure: COLONOSCOPY WITH PROPOFOL;  Surgeon: Toney Reil, MD;  Location: [facility] ENDOSCOPY;  Service: Gastroenterology;  Laterality: N/A;   COLONOSCOPY WITH PROPOFOL N/A 08/02/2022   Procedure: COLONOSCOPY WITH PROPOFOL;  Surgeon: Toney Reil, MD;  Location: [facility] ENDOSCOPY;  Service: Gastroenterology;  Laterality: N/AEarvin Hansen (Caregiver & Transporter) [phone] [name] [phone] will be available for phone consent   ESOPHAGOGASTRODUODENOSCOPY (EGD) WITH PROPOFOL N/A 07/06/2021   Procedure: ESOPHAGOGASTRODUODENOSCOPY (EGD) WITH PROPOFOL;  Surgeon: Toney Reil, MD;  Location: [facility] ENDOSCOPY;  Service: Gastroenterology;  Laterality: N/A;   GIVENS CAPSULE STUDY N/A 07/06/2021    Procedure: GIVENS CAPSULE STUDY;  Surgeon: Toney Reil, MD;  Location: [facility] ENDOSCOPY;  Service: Gastroenterology;  Laterality: N/A;   IR REMOVAL TUN ACCESS W/ PORT W/O FL MOD SED  02/15/2022   PORTACATH PLACEMENT Right 07/22/2021   Procedure: INSERTION PORT-A-CATH;  Surgeon: Leafy Ro, MD;  Location: [facility] ORS;  Service: General;  Laterality: Right;    Social History   Socioeconomic History   Marital status: Single    Spouse name: Not on file   Number of children: Not on file   Years of education: Not on file   Highest education level: Not on file  Occupational History   Not on file  Tobacco Use   Smoking status: Every Day    Packs/day: .25    Types: Cigarettes   Smokeless tobacco: Never   Tobacco comments:    4 cigarettes a day - khj 09/28/2022  Vaping Use   Vaping Use: Every day   Devices: elfbar  Substance and Sexual Activity   Alcohol use: Not Currently   Drug use: Not Currently   Sexual activity: Yes  Other Topics Concern   Not on file  Social History Narrative   Not on file   Social Determinants of Health   Financial Resource Strain: Not on file  Food Insecurity: Not on file  Transportation Needs: Not on file  Physical Activity: Not on file  Stress: Not on file  Social Connections: Not on file  Intimate Partner Violence: Not on file     Allergies  Allergen Reactions   Geodon [Ziprasidone Hcl] Anaphylaxis     CBC    Component Value Date/Time   WBC 5.9 09/25/2022 1334   RBC 4.84 09/25/2022 1334   HGB 14.9 09/25/2022 1334   HCT 44.4 09/25/2022 1334   PLT 199 09/25/2022 1334   MCV 91.7 09/25/2022 1334   MCH 30.8 09/25/2022 1334   MCHC 33.6 09/25/2022 1334   RDW 13.0 09/25/2022 1334   LYMPHSABS 1.6 09/25/2022 1334   MONOABS 0.4 09/25/2022 1334   EOSABS 0.2 09/25/2022 1334   BASOSABS 0.0 09/25/2022 1334    Pulmonary Functions Testing Results:     No data to display          Outpatient Medications Prior to Visit  Medication Sig  Dispense Refill   ARIPiprazole ER (ABILIFY MAINTENA) 400 MG PRSY prefilled syringe Inject 400 mg into the muscle every 21 ( twenty-one) days.     benztropine (COGENTIN) 2 MG tablet Take 2 mg by mouth at bedtime.     busPIRone (BUSPAR) 10 MG tablet Take 10 mg by mouth 2 (two) times daily.  FLUoxetine (PROZAC) 20 MG capsule Take 20 mg by mouth daily.     polyethylene glycol (MIRALAX / GLYCOLAX) 17 g packet Take 17 g by mouth daily as needed (constipation). 14 each 0   prazosin (MINIPRESS) 2 MG capsule Take 2 mg by mouth 2 (two) times daily.     dexamethasone (DECADRON) 4 MG tablet TAKE 2 TABLETS (8MG ) BY MOUTH ONCE DAILY *START THE DAY AFTER CHEMOTHERAPY FOR 2 DAYS. TAKE WITH FOOD. (Patient not taking: Reported on 09/28/2022) 30 tablet 10   No facility-administered medications prior to visit.

## 2022-09-28 NOTE — H&P (View-Only) (Signed)
  Assessment & Plan:   1. Adenocarcinoma of colon (HCC) 2. 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  There was also a 5 mm left lower lobe subpleural nodule.  Given his history of colon cancer and finding of adrenal nodule as well as enlarged lymph node in the abdomen, metastatic disease is on the differential.  Patient is presenting for consideration of biopsy of said nodule.  We discussed the importance of diagnosis and staging in lung malignancies, and the approach to obtaining a tissue diagnosis which would include robotic assisted navigational bronchoscopy with endobronchial ultrasound guided sampling.  We also discussed the risks associated with the procedure which include a 2% risk of pneumothorax, infection, bleeding, and nondiagnostic procedure in detail.  I explained that patients typically are able to return home the same day of the procedure, but in rare cases admission to the hospital for observation and treatment is required.  After our discussion, the patient elected to proceed with the procedure  Recommendations: Robotic Assisted navigational bronchoscopy to LLL nodule with EBUS    Return if symptoms worsen or fail to improve.  I spent 60 minutes caring for this patient today, including preparing to see the patient, obtaining a medical history , reviewing a separately obtained history, performing a medically appropriate examination and/or evaluation, counseling and educating the patient/family/caregiver, ordering medications, tests, or procedures, documenting clinical information in the electronic health record, and independently interpreting results (not separately reported/billed) and communicating results to the patient/family/caregiver  Jewelia Bocchino, MD [facility] 09/28/2022 1:17 PM    End of visit medications:  No orders of the defined types were placed in this encounter.    Current Outpatient Medications:    ARIPiprazole ER (ABILIFY MAINTENA) 400 MG PRSY prefilled syringe, Inject 400 mg into the muscle every 21 ( twenty-one) days., Disp: , Rfl:    benztropine (COGENTIN) 2 MG tablet, Take 2 mg by mouth at bedtime., Disp: , Rfl:    busPIRone (BUSPAR) 10 MG tablet, Take 10 mg by mouth 2 (two) times daily., Disp: , Rfl:    FLUoxetine (PROZAC) 20 MG capsule, Take 20 mg by mouth daily., Disp: , Rfl:    polyethylene glycol (MIRALAX / GLYCOLAX) 17 g packet, Take 17 g by mouth daily as needed (constipation)., Disp: 14 each, Rfl: 0   prazosin (MINIPRESS) 2 MG capsule, Take 2 mg by mouth 2 (two) times daily., Disp: , Rfl:    dexamethasone (DECADRON) 4 MG tablet, TAKE 2 TABLETS (8MG) BY MOUTH ONCE DAILY *START THE DAY AFTER CHEMOTHERAPY FOR 2 DAYS. TAKE WITH FOOD. (Patient not taking: Reported on 09/28/2022), Disp: 30 tablet, Rfl: 10   Subjective:   PATIENT ID: Connor Wilkinson GENDER: male DOB: 04/14/1986, MRN: [id number]  Chief Complaint  Patient presents with   Consult    Lung nodule.     HPI  The patient is a pleasant 37-year-old male presenting to clinic for the evaluation of a pulmonary nodule.  Patient has been followed closely by Dr. Rao from oncology for adenocarcinoma of the colon.  Patient was diagnosed with moderately differentiated colonic adenocarcinoma (stage III) after colonoscopy was performed for the evaluation of anemia.  He underwent right hemicolectomy in March 2023 with eventual staging pathologic stage T4a N2b.  He then completed 12   cycles of adjuvant FOLFOX chemotherapy followed by adjuvant radiation therapy in September 2023.  Surveillance imaging this year is notable for 2 pulmonary nodules in the left lower lobe, the largest of which is abutting the fissure measuring up to 15 mm.   There was also concern for necrotic appearing nodule in the abdomen as well as a 7 mm left adrenal nodule.  Patient is referred to pulmonary for further evaluation and possible biopsy of the pulmonary nodule.  He overall feels in his usual state of health and has no complaints.  He has no respiratory symptoms and denies any shortness of breath, cough, chest pain, chest tightness, fevers, chills, night sweats, or weight loss.  Given this was found on surveillance imaging, patient is also scheduled for a PET/CT for today.  Patient reports history of smoking cigarettes, and is currently smoking between 4 and 5 cigarettes a day.  He also reports using a nicotine vape.  He lives at a group home.  Ancillary information including prior medications, full medical/surgical/family/social histories, and PFTs (when available) are listed below and have been reviewed.   Review of Systems  Constitutional:  Negative for chills, fever, malaise/fatigue and weight loss.  Respiratory:  Negative for cough, hemoptysis, sputum production, shortness of breath and wheezing.   Cardiovascular:  Negative for chest pain.     Objective:   Vitals:   09/28/22 0836  BP: 126/82  Pulse: 87  Temp: (!) 97.5 F (36.4 C)  SpO2: 97%  Weight: 187 lb (84.8 kg)  Height: 6' (1.829 m)   97% on RA BMI Readings from Last 3 Encounters:  09/28/22 25.36 kg/m  09/25/22 25.23 kg/m  08/02/22 23.95 kg/m   Wt Readings from Last 3 Encounters:  09/28/22 187 lb (84.8 kg)  09/25/22 186 lb (84.4 kg)  08/02/22 176 lb 9.6 oz (80.1 kg)    Physical Exam Constitutional:      General: He is not in acute distress.    Appearance: Normal appearance. He is not ill-appearing.  HENT:     Head: Normocephalic.     Mouth/Throat:     Mouth: Mucous membranes are moist.  Eyes:     Extraocular Movements: Extraocular movements intact.  Cardiovascular:     Rate and Rhythm: Normal rate and regular rhythm.     Pulses: Normal pulses.     Heart  sounds: Normal heart sounds.  Pulmonary:     Effort: Pulmonary effort is normal.     Breath sounds: Normal breath sounds. No wheezing or rales.  Abdominal:     General: Abdomen is flat.     Palpations: Abdomen is soft.  Musculoskeletal:     Right lower leg: No edema.     Left lower leg: No edema.  Neurological:     General: No focal deficit present.     Mental Status: He is alert and oriented to person, place, and time. Mental status is at baseline.     Ancillary Information    Past Medical History:  Diagnosis Date   Bipolar 1 disorder (HCC)    Colon cancer (HCC)    PTSD (post-traumatic stress disorder)      Family History  Problem Relation Age of Onset   Kidney cancer Father    Breast cancer Maternal Aunt      Past Surgical History:  Procedure Laterality Date   COLON RESECTION N/A 07/12/2021   Procedure: HAND ASSISTED LAPAROSCOPIC COLON RESECTION;  Surgeon: Pabon, Diego F, MD;  Location: [facility];  Service: General;    Laterality: N/A;   COLONOSCOPY WITH PROPOFOL N/A 07/06/2021   Procedure: COLONOSCOPY WITH PROPOFOL;  Surgeon: Vanga, Rohini Reddy, MD;  Location: [facility];  Service: Gastroenterology;  Laterality: N/A;   COLONOSCOPY WITH PROPOFOL N/A 07/07/2021   Procedure: COLONOSCOPY WITH PROPOFOL;  Surgeon: Vanga, Rohini Reddy, MD;  Location: [facility];  Service: Gastroenterology;  Laterality: N/A;   COLONOSCOPY WITH PROPOFOL N/A 08/02/2022   Procedure: COLONOSCOPY WITH PROPOFOL;  Surgeon: Vanga, Rohini Reddy, MD;  Location: [facility];  Service: Gastroenterology;  Laterality: N/A;  Gerald (Caregiver & Transporter) [phone] Joe [name] (Guardian) [phone] will be available for phone consent   ESOPHAGOGASTRODUODENOSCOPY (EGD) WITH PROPOFOL N/A 07/06/2021   Procedure: ESOPHAGOGASTRODUODENOSCOPY (EGD) WITH PROPOFOL;  Surgeon: Vanga, Rohini Reddy, MD;  Location: [facility];  Service: Gastroenterology;  Laterality: N/A;   GIVENS CAPSULE STUDY N/A 07/06/2021    Procedure: GIVENS CAPSULE STUDY;  Surgeon: Vanga, Rohini Reddy, MD;  Location: [facility];  Service: Gastroenterology;  Laterality: N/A;   IR REMOVAL TUN ACCESS W/ PORT W/O FL MOD SED  02/15/2022   PORTACATH PLACEMENT Right 07/22/2021   Procedure: INSERTION PORT-A-CATH;  Surgeon: Pabon, Diego F, MD;  Location: [facility];  Service: General;  Laterality: Right;    Social History   Socioeconomic History   Marital status: Single    Spouse name: Not on file   Number of children: Not on file   Years of education: Not on file   Highest education level: Not on file  Occupational History   Not on file  Tobacco Use   Smoking status: Every Day    Packs/day: .25    Types: Cigarettes   Smokeless tobacco: Never   Tobacco comments:    4 cigarettes a day - khj 09/28/2022  Vaping Use   Vaping Use: Every day   Devices: elfbar  Substance and Sexual Activity   Alcohol use: Not Currently   Drug use: Not Currently   Sexual activity: Yes  Other Topics Concern   Not on file  Social History Narrative   Not on file   Social Determinants of Health   Financial Resource Strain: Not on file  Food Insecurity: Not on file  Transportation Needs: Not on file  Physical Activity: Not on file  Stress: Not on file  Social Connections: Not on file  Intimate Partner Violence: Not on file     Allergies  Allergen Reactions   Geodon [Ziprasidone Hcl] Anaphylaxis     CBC    Component Value Date/Time   WBC 5.9 09/25/2022 1334   RBC 4.84 09/25/2022 1334   HGB 14.9 09/25/2022 1334   HCT 44.4 09/25/2022 1334   PLT 199 09/25/2022 1334   MCV 91.7 09/25/2022 1334   MCH 30.8 09/25/2022 1334   MCHC 33.6 09/25/2022 1334   RDW 13.0 09/25/2022 1334   LYMPHSABS 1.6 09/25/2022 1334   MONOABS 0.4 09/25/2022 1334   EOSABS 0.2 09/25/2022 1334   BASOSABS 0.0 09/25/2022 1334    Pulmonary Functions Testing Results:     No data to display          Outpatient Medications Prior to Visit  Medication Sig  Dispense Refill   ARIPiprazole ER (ABILIFY MAINTENA) 400 MG PRSY prefilled syringe Inject 400 mg into the muscle every 21 ( twenty-one) days.     benztropine (COGENTIN) 2 MG tablet Take 2 mg by mouth at bedtime.     busPIRone (BUSPAR) 10 MG tablet Take 10 mg by mouth 2 (two) times daily.       FLUoxetine (PROZAC) 20 MG capsule Take 20 mg by mouth daily.     polyethylene glycol (MIRALAX / GLYCOLAX) 17 g packet Take 17 g by mouth daily as needed (constipation). 14 each 0   prazosin (MINIPRESS) 2 MG capsule Take 2 mg by mouth 2 (two) times daily.     dexamethasone (DECADRON) 4 MG tablet TAKE 2 TABLETS (8MG) BY MOUTH ONCE DAILY *START THE DAY AFTER CHEMOTHERAPY FOR 2 DAYS. TAKE WITH FOOD. (Patient not taking: Reported on 09/28/2022) 30 tablet 10   No facility-administered medications prior to visit.   

## 2022-09-29 ENCOUNTER — Telehealth: Payer: Self-pay

## 2022-09-29 NOTE — Telephone Encounter (Signed)
Per secure chat from Dr. Aundria Rud, I scheduled Mr. Snowdon for a bronch on 6/27. he'll need his super D scan prior to that, and to be informed of all the details. he's at the group home so the group home would need to be called with the details. thank you very much!    Per Vara Guardian, she has schedule his CT. She has called the patient's caregiver and given them the CT and Bronch appt time and dates.  Nothing further needed.

## 2022-10-10 ENCOUNTER — Encounter (HOSPITAL_COMMUNITY): Payer: Self-pay | Admitting: Student in an Organized Health Care Education/Training Program

## 2022-10-10 NOTE — Progress Notes (Signed)
Patient resides in a group home ([facility]).   PCP - Dr [name] Cardiologist - none Hematology/Oncology - Dr Owens Shark  Chest x-ray - 10/11/22 scheduled EKG - *** Stress Test - n/a ECHO - n/a Cardiac Cath - n/a  ICD Pacemaker/Loop - n/a  Sleep Study -  n/a CPAP - none  Diabetes Type - n/a  NPO  Anesthesia review: Yes  STOP now taking any Aspirin (unless otherwise instructed by your surgeon), Aleve, Naproxen, Ibuprofen, Motrin, Advil, Goody's, BC's, all herbal medications, fish oil, and all vitamins.   Coronavirus Screening Do you have any of the following symptoms:  Cough yes/no: No Fever (>100.47F)  yes/no: No Runny nose yes/no: No Sore throat yes/no: No Difficulty breathing/shortness of breath  yes/no: No  Have you traveled in the last 14 days and where? yes/no: No  Patient verbalized understanding of instructions that were given via phone.

## 2022-10-11 ENCOUNTER — Encounter (HOSPITAL_COMMUNITY): Payer: Self-pay | Admitting: Student in an Organized Health Care Education/Training Program

## 2022-10-11 ENCOUNTER — Other Ambulatory Visit: Payer: Self-pay

## 2022-10-11 ENCOUNTER — Ambulatory Visit
Admission: RE | Admit: 2022-10-11 | Discharge: 2022-10-11 | Disposition: A | Discharge status: Home / Self Care | Payer: Medicaid Other | Source: Ambulatory Visit | Attending: Student in an Organized Health Care Education/Training Program | Admitting: Student in an Organized Health Care Education/Training Program

## 2022-10-11 DIAGNOSIS — C189 Malignant neoplasm of colon, unspecified: Secondary | ICD-10-CM | POA: Diagnosis present

## 2022-10-11 DIAGNOSIS — R911 Solitary pulmonary nodule: Secondary | ICD-10-CM | POA: Diagnosis present

## 2022-10-11 NOTE — Progress Notes (Signed)
Surgical Instructions for [place] Day of Surgery 10/12/22    Your procedure is scheduled on Thursday, 10/12/22.  Report to [facility] Main Entrance "A" at 6:45 A.M., then check in with the Admitting office.  Call this number if you have problems the morning of surgery:  [phone]   If you have any questions prior to your surgery date call [phone]: Open Monday-Friday 8am-4pm If you experience any cold or flu symptoms such as cough, fever, chills, shortness of breath, etc. between now and your scheduled surgery, please notify us at the above number     Remember:  Do not eat or drink after midnight tonight (Wed.)    Take these medicines the morning of surgery with A SIP OF WATER:   Buspar Prozac Minipress Zofran if needed    As of today, STOP taking any Aspirin (unless otherwise instructed by your surgeon) Aleve, Naproxen, Ibuprofen, Motrin, Advil, Goody's, BC's, all herbal medications, fish oil, and all vitamins.           Do not wear jewelry. Do not wear lotions, powders, cologne or deodorant. Men may shave face and neck. Do not bring valuables to the hospital.  [facility] is not responsible for any belongings or valuables.    Do NOT Smoke (Tobacco/Vaping)  24 hours prior to your procedure  If you use a CPAP at night, you may bring your mask for your overnight stay.   Contacts, glasses, hearing aids, dentures or partials may not be worn into surgery, please bring cases for these belongings   For patients admitted to the hospital, discharge time will be determined by your treatment team.   Patients discharged the day of surgery will not be allowed to drive home, and someone needs to stay with them for 24 hours.   SURGICAL WAITING ROOM VISITATION Patients having surgery or a procedure may have no more than 2 support people in the waiting area - these visitors may rotate.   Children under the age of 45 must have an adult with them who is not the  patient. If the patient needs to stay at the hospital during part of their recovery, the visitor guidelines for inpatient rooms apply. Pre-op nurse will coordinate an appropriate time for 1 support person to accompany patient in pre-op.  This support person may not rotate.   Please refer to [email or web address] for the visitor guidelines for Inpatients (after your surgery is over and you are in a regular room).    Special instructions:    Oral Hygiene is also important to reduce your risk of infection.  Remember - BRUSH YOUR TEETH THE MORNING OF SURGERY WITH YOUR REGULAR TOOTHPASTE

## 2022-10-12 ENCOUNTER — Other Ambulatory Visit: Payer: Self-pay

## 2022-10-12 ENCOUNTER — Ambulatory Visit (HOSPITAL_COMMUNITY): Payer: Medicaid Other

## 2022-10-12 ENCOUNTER — Ambulatory Visit (HOSPITAL_BASED_OUTPATIENT_CLINIC_OR_DEPARTMENT_OTHER): Payer: Medicaid Other | Admitting: Medical

## 2022-10-12 ENCOUNTER — Ambulatory Visit (HOSPITAL_COMMUNITY): Payer: Medicaid Other | Admitting: Medical

## 2022-10-12 ENCOUNTER — Encounter (HOSPITAL_COMMUNITY): Payer: Self-pay | Admitting: Student in an Organized Health Care Education/Training Program

## 2022-10-12 ENCOUNTER — Encounter (HOSPITAL_COMMUNITY)
Admission: RE | Disposition: A | Discharge status: Skilled Nursing Facility | Payer: Self-pay | Source: Home / Self Care | Attending: Student in an Organized Health Care Education/Training Program

## 2022-10-12 ENCOUNTER — Ambulatory Visit (HOSPITAL_COMMUNITY)
Admission: RE | Admit: 2022-10-12 | Discharge: 2022-10-12 | Disposition: A | Discharge status: Skilled Nursing Facility | Payer: Medicaid Other | Attending: Student in an Organized Health Care Education/Training Program | Admitting: Student in an Organized Health Care Education/Training Program

## 2022-10-12 DIAGNOSIS — F17290 Nicotine dependence, other tobacco product, uncomplicated: Secondary | ICD-10-CM | POA: Diagnosis not present

## 2022-10-12 DIAGNOSIS — R911 Solitary pulmonary nodule: Secondary | ICD-10-CM | POA: Diagnosis not present

## 2022-10-12 DIAGNOSIS — Z85038 Personal history of other malignant neoplasm of large intestine: Secondary | ICD-10-CM | POA: Diagnosis not present

## 2022-10-12 DIAGNOSIS — F17210 Nicotine dependence, cigarettes, uncomplicated: Secondary | ICD-10-CM

## 2022-10-12 DIAGNOSIS — C189 Malignant neoplasm of colon, unspecified: Secondary | ICD-10-CM

## 2022-10-12 DIAGNOSIS — Z9221 Personal history of antineoplastic chemotherapy: Secondary | ICD-10-CM | POA: Insufficient documentation

## 2022-10-12 DIAGNOSIS — Z923 Personal history of irradiation: Secondary | ICD-10-CM | POA: Diagnosis not present

## 2022-10-12 DIAGNOSIS — Z9049 Acquired absence of other specified parts of digestive tract: Secondary | ICD-10-CM | POA: Insufficient documentation

## 2022-10-12 HISTORY — PX: BRONCHIAL NEEDLE ASPIRATION BIOPSY: SHX5106

## 2022-10-12 SURGERY — BRONCHOSCOPY, WITH BIOPSY USING ELECTROMAGNETIC NAVIGATION
Anesthesia: General | Laterality: Bilateral

## 2022-10-12 MED ORDER — LIDOCAINE 2% (20 MG/ML) 5 ML SYRINGE
INTRAMUSCULAR | Status: DC | PRN
  Administered 2022-10-12: 60 mg via INTRAVENOUS

## 2022-10-12 MED ORDER — FENTANYL CITRATE (PF) 100 MCG/2ML IJ SOLN
INTRAMUSCULAR | Status: DC | PRN
  Administered 2022-10-12: 100 ug via INTRAVENOUS

## 2022-10-12 MED ORDER — PHENYLEPHRINE HCL-NACL 20-0.9 MG/250ML-% IV SOLN
INTRAVENOUS | Status: DC | PRN
  Administered 2022-10-12: 25 ug/min via INTRAVENOUS

## 2022-10-12 MED ORDER — CHLORHEXIDINE GLUCONATE 0.12 % MT SOLN
OROMUCOSAL | Status: AC
  Administered 2022-10-12: 15 mL via OROMUCOSAL
  Filled 2022-10-12: qty 15

## 2022-10-12 MED ORDER — AMISULPRIDE (ANTIEMETIC) 5 MG/2ML IV SOLN: 10.0000 mg | Freq: Once | INTRAVENOUS | Status: DC | PRN

## 2022-10-12 MED ORDER — FENTANYL CITRATE (PF) 100 MCG/2ML IJ SOLN: 25.0000 ug | INTRAMUSCULAR | Status: DC | PRN

## 2022-10-12 MED ORDER — ONDANSETRON HCL 4 MG/2ML IJ SOLN
INTRAMUSCULAR | Status: DC | PRN
  Administered 2022-10-12: 4 mg via INTRAVENOUS

## 2022-10-12 MED ORDER — MIDAZOLAM HCL 2 MG/2ML IJ SOLN
INTRAMUSCULAR | Status: DC | PRN
  Administered 2022-10-12: 2 mg via INTRAVENOUS

## 2022-10-12 MED ORDER — DEXAMETHASONE SODIUM PHOSPHATE 10 MG/ML IJ SOLN
INTRAMUSCULAR | Status: DC | PRN
  Administered 2022-10-12: 10 mg via INTRAVENOUS

## 2022-10-12 MED ORDER — SUGAMMADEX SODIUM 200 MG/2ML IV SOLN
INTRAVENOUS | Status: DC | PRN
  Administered 2022-10-12: 200 mg via INTRAVENOUS

## 2022-10-12 MED ORDER — CHLORHEXIDINE GLUCONATE 0.12 % MT SOLN: 15.0000 mL | Freq: Once | OROMUCOSAL | Status: AC

## 2022-10-12 MED ORDER — LACTATED RINGERS IV SOLN: INTRAVENOUS | Status: DC

## 2022-10-12 MED ORDER — ROCURONIUM BROMIDE 10 MG/ML (PF) SYRINGE
PREFILLED_SYRINGE | INTRAVENOUS | Status: DC | PRN
  Administered 2022-10-12: 30 mg via INTRAVENOUS
  Administered 2022-10-12: 50 mg via INTRAVENOUS

## 2022-10-12 MED ORDER — PROPOFOL 10 MG/ML IV BOLUS
INTRAVENOUS | Status: DC | PRN
  Administered 2022-10-12: 200 mg via INTRAVENOUS

## 2022-10-12 MED ORDER — PROPOFOL 500 MG/50ML IV EMUL
INTRAVENOUS | Status: DC | PRN
  Administered 2022-10-12: 125 ug/kg/min via INTRAVENOUS

## 2022-10-12 NOTE — Anesthesia Preprocedure Evaluation (Signed)
Anesthesia Evaluation  Patient identified by MRN, date of birth, ID band Patient awake    Reviewed: Allergy & Precautions, NPO status , Patient's Chart, lab work & pertinent test results  Airway Mallampati: II  TM Distance: >3 FB Neck ROM: Full    Dental  (+) Dental Advisory Given   Pulmonary Current Smoker and Patient abstained from smoking.   breath sounds clear to auscultation       Cardiovascular negative cardio ROS  Rhythm:Regular Rate:Normal     Neuro/Psych  PSYCHIATRIC DISORDERS      negative neurological ROS     GI/Hepatic Neg liver ROS,,,Colon CA   Endo/Other  negative endocrine ROS    Renal/GU negative Renal ROS     Musculoskeletal   Abdominal   Peds  Hematology negative hematology ROS (+)   Anesthesia Other Findings   Reproductive/Obstetrics                             Anesthesia Physical Anesthesia Plan  ASA: 2  Anesthesia Plan: General   Post-op Pain Management: Minimal or no pain anticipated   Induction: Intravenous  PONV Risk Score and Plan: 1 and Dexamethasone, Ondansetron and Treatment may vary due to age or medical condition  Airway Management Planned: Oral ETT  Additional Equipment: None  Intra-op Plan:   Post-operative Plan: Extubation in OR  Informed Consent: I have reviewed the patients History and Physical, chart, labs and discussed the procedure including the risks, benefits and alternatives for the proposed anesthesia with the patient or authorized representative who has indicated his/her understanding and acceptance.     Dental advisory given  Plan Discussed with: CRNA  Anesthesia Plan Comments:        Anesthesia Quick Evaluation

## 2022-10-12 NOTE — Interval H&P Note (Signed)
S: Patient feels well, denies shortness of breath, cough, hemoptysis, or chest pain.  O: Vitals:   10/12/22 0616  BP: 117/77  Pulse: (!) 55  Resp: 18  Temp: 98.3 F (36.8 C)  SpO2: 99%   General: Well-appearing and in no distress. Well-nourished Eyes: Anicteric, no conjunctival pallor HEENT: Mucous membranes moist, no evidence of postnasal drip Respiratory: Trachea is midline, no respiratory distress, good bilateral air entry, no wheezes, rales, or rhonchi Cardiovascular: Heart with regular rate and rhythm, normal S1 and S2, no murmurs, rubs, or gallops Neuro: Alert and oriented, no gross focal deficits  A&P:  Pleasant 37 year old male with a past medical history of colon cancer, s/p FOLFOX, noted to have a LLL nodule on surveillance imaging, referred to [place] for biopsy. Patient is appropriate for the procedure, will proceed with robotic assisted navigational bronchoscopy for tissue acquisition.  Raechel Chute, MD [facility] 10/12/2022 9:27 AM

## 2022-10-12 NOTE — Anesthesia Procedure Notes (Signed)
Procedure Name: Intubation Date/Time: 10/12/2022 9:45 AM  Performed by: Nils Pyle, CRNAPre-anesthesia Checklist: Patient identified, Emergency Drugs available, Suction available and Patient being monitored Patient Re-evaluated:Patient Re-evaluated prior to induction Oxygen Delivery Method: Circle System Utilized Preoxygenation: Pre-oxygenation with 100% oxygen Induction Type: IV induction Ventilation: Mask ventilation without difficulty Laryngoscope Size: Miller and 2 Grade View: Grade I Tube type: Oral Tube size: 8.5 mm Number of attempts: 1 Airway Equipment and Method: Stylet and Oral airway Placement Confirmation: ETT inserted through vocal cords under direct vision, positive ETCO2 and breath sounds checked- equal and bilateral Secured at: 24 cm Tube secured with: Tape Dental Injury: Teeth and Oropharynx as per pre-operative assessment

## 2022-10-12 NOTE — Transfer of Care (Signed)
Immediate Anesthesia Transfer of Care Note  Patient: Connor Wilkinson  Procedure(s) Performed: ROBOTIC ASSISTED NAVIGATIONAL BRONCHOSCOPY (Bilateral) BRONCHIAL NEEDLE ASPIRATION BIOPSIES  Patient Location: PACU  Anesthesia Type:General  Level of Consciousness: awake and alert   Airway & Oxygen Therapy: Patient Spontanous Breathing and Patient connected to nasal cannula oxygen  Post-op Assessment: Report given to RN and Post -op Vital signs reviewed and stable  Post vital signs: Reviewed and stable  Last Vitals:  Vitals Value Taken Time  BP 98/62 10/12/22 1046  Temp    Pulse 60 10/12/22 1047  Resp 15 10/12/22 1047  SpO2 99 % 10/12/22 1047  Vitals shown include unvalidated device data.  Last Pain:  Vitals:   10/12/22 0705  TempSrc:   PainSc: 0-No pain      Patients Stated Pain Goal: 0 (10/12/22 0705)  Complications: No notable events documented.

## 2022-10-12 NOTE — Op Note (Signed)
Video Bronchoscopy with Robotic Assisted Bronchoscopic Navigation   Date of Operation: 10/12/2022   Pre-op Diagnosis: lung nodule, history of colon cancer  Surgeon: Raechel Chute, MD  Anesthesia: General endotracheal anesthesia  Operation: Flexible video fiberoptic bronchoscopy with robotic assistance and biopsies.  Estimated Blood Loss: Minimal  Complications: None  Indications and History: Connor Wilkinson is a 37 y.o. male with history of colon cancer s/p chemotherapy found to have a LLL pulmonary nodule presenting for robotic assisted navigational bronchoscopy for biopsy. The risks, benefits, complications, treatment options and expected outcomes were discussed with the patient.  The possibilities of pneumothorax, pneumonia, reaction to medication, pulmonary aspiration, perforation of a viscus, bleeding, failure to diagnose a condition and creating a complication requiring transfusion or operation were discussed with the patient who freely signed the consent.    Description of Procedure: The patient was seen in the Preoperative Area, was examined and was deemed appropriate to proceed.  The patient was taken to [facility] endoscopy room 3, identified as Connor Wilkinson and the procedure verified as Flexible Video Fiberoptic Bronchoscopy.  A Time Out was held and the above information confirmed.   Prior to the date of the procedure a high-resolution CT scan of the chest was performed. Utilizing ION software program a virtual tracheobronchial tree was generated to allow the creation of distinct navigation pathways to the patient's parenchymal abnormalities. After being taken to the operating room general anesthesia was initiated and the patient  was orally intubated. The video fiberoptic bronchoscope was introduced via the endotracheal tube and a general inspection was performed which showed normal right and left lung anatomy, aspiration of the bilateral mainstems was completed to remove any remaining  secretions. Robotic catheter inserted into patient's endotracheal tube.   Target #1 LLL nodule: The distinct navigation pathways prepared prior to this procedure were then utilized to navigate to patient's lesion identified on CT scan. The robotic catheter was secured into place and the vision probe was withdrawn.  Lesion location was approximated using fluoroscopy, and we utilized the CIOS spin for a cone beam image to correct for CT to body divergence. Under fluoroscopic guidance transbronchial needle biopsies were obtained from the lesion (10 passes). Given location near the bronchus, transbronchial forceps would not be feasible and were not performed. Samples sent for slide and cytology.  At the end of the procedure a general airway inspection was performed and there was no evidence of active bleeding. The bronchoscope was removed.  The patient tolerated the procedure well. There was no significant blood loss and there were no obvious complications. A post-procedural chest x-ray is pending.  Samples Target #1: LLL Nodule 1. Transbronchial needle needle biopsies from LLL nodule  Plans:  The patient will be discharged from the PACU to home when recovered from anesthesia and after chest x-ray is reviewed. We will review the cytology, pathology and microbiology results with the patient when they become available. Outpatient followup will be with with Dr. Owens Shark.  Raechel Chute, MD [facility] 10/12/2022 10:29 AM

## 2022-10-13 LAB — CYTOLOGY - NON PAP

## 2022-10-15 ENCOUNTER — Encounter (HOSPITAL_COMMUNITY): Payer: Self-pay | Admitting: Student in an Organized Health Care Education/Training Program

## 2022-10-16 ENCOUNTER — Encounter: Payer: Self-pay | Admitting: Student in an Organized Health Care Education/Training Program

## 2022-10-16 NOTE — Anesthesia Postprocedure Evaluation (Signed)
Anesthesia Post Note  Patient: Connor Wilkinson  Procedure(s) Performed: ROBOTIC ASSISTED NAVIGATIONAL BRONCHOSCOPY (Bilateral) BRONCHIAL NEEDLE ASPIRATION BIOPSIES     Patient location during evaluation: PACU Anesthesia Type: General Level of consciousness: awake and alert Pain management: pain level controlled Vital Signs Assessment: post-procedure vital signs reviewed and stable Respiratory status: spontaneous breathing, nonlabored ventilation, respiratory function stable and patient connected to nasal cannula oxygen Cardiovascular status: blood pressure returned to baseline and stable Postop Assessment: no apparent nausea or vomiting Anesthetic complications: no   No notable events documented.  Last Vitals:  Vitals:   10/12/22 1147 10/12/22 1200  BP: (!) 101/55 (!) 97/55  Pulse: 61 65  Resp: 20 16  Temp:  36.7 C  SpO2: 99% 100%    Last Pain:  Vitals:   10/12/22 1045  TempSrc:   PainSc: Asleep                 Kennieth Rad

## 2022-10-19 ENCOUNTER — Encounter: Payer: Self-pay | Admitting: Oncology

## 2022-10-26 ENCOUNTER — Inpatient Hospital Stay: Payer: MEDICAID

## 2022-10-26 ENCOUNTER — Inpatient Hospital Stay: Payer: MEDICAID | Attending: Oncology | Admitting: Oncology

## 2022-10-26 ENCOUNTER — Encounter: Payer: Self-pay | Admitting: Oncology

## 2022-10-26 VITALS — BP 117/74 | HR 80 | Temp 96.3°F | Resp 19 | Wt 192.7 lb

## 2022-10-26 DIAGNOSIS — Z85038 Personal history of other malignant neoplasm of large intestine: Secondary | ICD-10-CM | POA: Insufficient documentation

## 2022-10-26 DIAGNOSIS — Z8051 Family history of malignant neoplasm of kidney: Secondary | ICD-10-CM | POA: Insufficient documentation

## 2022-10-26 DIAGNOSIS — F17210 Nicotine dependence, cigarettes, uncomplicated: Secondary | ICD-10-CM | POA: Insufficient documentation

## 2022-10-26 DIAGNOSIS — R911 Solitary pulmonary nodule: Secondary | ICD-10-CM | POA: Insufficient documentation

## 2022-10-26 DIAGNOSIS — R970 Elevated carcinoembryonic antigen [CEA]: Secondary | ICD-10-CM | POA: Diagnosis not present

## 2022-10-26 DIAGNOSIS — Z08 Encounter for follow-up examination after completed treatment for malignant neoplasm: Secondary | ICD-10-CM | POA: Diagnosis not present

## 2022-10-26 DIAGNOSIS — Z803 Family history of malignant neoplasm of breast: Secondary | ICD-10-CM | POA: Diagnosis not present

## 2022-10-27 ENCOUNTER — Other Ambulatory Visit: Payer: Self-pay

## 2022-10-27 LAB — CEA: CEA: 6.2 ng/mL — ABNORMAL HIGH (ref 0.0–4.7)

## 2022-10-29 ENCOUNTER — Encounter: Payer: Self-pay | Admitting: Oncology

## 2022-10-29 NOTE — Progress Notes (Signed)
Hematology/Oncology Consult note [facility]  Telephone:([phone] [phone]  Patient Care Team: [name], MD as PCP - General (Internal Medicine) Creig Hines, MD as Consulting Physician (Hematology and Oncology) Leafy Ro, MD as Consulting Physician (General Surgery) Toney Reil, MD as Consulting Physician (Gastroenterology) Benita Gutter, RN as Oncology Nurse Navigator   Name of the patient: Connor Wilkinson  [id number]  05-Feb-1986   Date of visit: 10/29/22  Diagnosis- stage IIIC adenocarcinoma of the colon [id number]   Chief complaint/ Reason for visit- discuss biopsy results and further management  Heme/Onc history: patient is a 37 year old male with a past medical history significant for mild bipolar disorder who is a ward of the stateAnd was admitted to the hospital for severe anemia.  He was found to have a hemoglobin of 4.5/18.7 on admission.  He received blood transfusion as well as IV iron and his hemoglobin is presently up to an 8.  He underwent colonoscopy which showed malignant partially obstructing tumor that was circumferential 80 cm proximal to the anus.  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  CT chest abdomen and pelvis with contrast did not show any evidence of distant metastatic disease.  2 small sclerotic densities in the acetabulum and proximal right femur likely benign process.  Pathologically enlarged lymph nodes superior to circumferential colonic lesion suggesting metastatic adenopathy.   Patient underwent right hemicolectomy with Dr. Everlene Farrier on 07/12/2021.  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  Vermiform appendix negative for tumor.  Radial margin positive for tumor (2 lymph nodes at margin with  adenocarcinoma).  Proximal and distal margins negative.  7 out of 56 lymph nodes positive for metastatic adenocarcinoma.  Tumor deposits not applicable.  No lymphovascular or perineural invasion seen.  [id number]   Fertility conservation was also discussed an option of sperm banking at [facility] was discussed with the patient and his healthcare power of attorney [name].  There would be a one-time cost involved for sperm banking and a yearly cost for preserving the specimen.  Overall chances of infertility with FOLFOX is low but possible.  Patient and his healthcare power of attorney have discussed their options and have decided not to proceed with fertility conservation at this time   Patient completed 12 cycles of adjuvant FOLFOX chemotherapy in followed by adjuvant radiation therapy in September 2023    Interval history- patient is doing well and denies any complaints at this time  ECOG PS- 0 Pain scale- 0   Review of systems- Review of Systems  Constitutional:  Negative for chills, fever, malaise/fatigue and weight loss.  HENT:  Negative for congestion, ear discharge and nosebleeds.   Eyes:  Negative for blurred vision.  Respiratory:  Negative for cough, hemoptysis, sputum production, shortness of breath and wheezing.   Cardiovascular:  Negative for chest pain, palpitations, orthopnea and claudication.  Gastrointestinal:  Negative for abdominal pain, blood in stool, constipation, diarrhea, heartburn, melena, nausea and vomiting.  Genitourinary:  Negative for dysuria, flank pain, frequency, hematuria and urgency.  Musculoskeletal:  Negative for back pain, joint pain and myalgias.  Skin:  Negative for rash.  Neurological:  Negative for dizziness, tingling, focal weakness, seizures, weakness and headaches.  Endo/Heme/Allergies:  Does not bruise/bleed easily.  Psychiatric/Behavioral:  Negative for depression and suicidal ideas. The patient does not have insomnia.  Allergies  Allergen  Reactions   Geodon [Ziprasidone Hcl] Anaphylaxis     Past Medical History:  Diagnosis Date   Bipolar 1 disorder (HCC)    Colon cancer (HCC)    Lung nodule 09/2022   PTSD (post-traumatic stress disorder)      Past Surgical History:  Procedure Laterality Date   BRONCHIAL NEEDLE ASPIRATION BIOPSY  10/12/2022   Procedure: BRONCHIAL NEEDLE ASPIRATION BIOPSIES;  Surgeon: Raechel Chute, MD;  Location: [facility] ENDOSCOPY;  Service: Pulmonary;;   COLON RESECTION N/A 07/12/2021   Procedure: HAND ASSISTED LAPAROSCOPIC COLON RESECTION;  Surgeon: Leafy Ro, MD;  Location: [facility] ORS;  Service: General;  Laterality: N/A;   COLONOSCOPY WITH PROPOFOL N/A 07/06/2021   Procedure: COLONOSCOPY WITH PROPOFOL;  Surgeon: Toney Reil, MD;  Location: [facility] ENDOSCOPY;  Service: Gastroenterology;  Laterality: N/A;   COLONOSCOPY WITH PROPOFOL N/A 07/07/2021   Procedure: COLONOSCOPY WITH PROPOFOL;  Surgeon: Toney Reil, MD;  Location: [facility] ENDOSCOPY;  Service: Gastroenterology;  Laterality: N/A;   COLONOSCOPY WITH PROPOFOL N/A 08/02/2022   Procedure: COLONOSCOPY WITH PROPOFOL;  Surgeon: Toney Reil, MD;  Location: [facility] ENDOSCOPY;  Service: Gastroenterology;  Laterality: N/AEarvin Hansen (Caregiver & Transporter) [phone] [name] [phone] will be available for phone consent   ESOPHAGOGASTRODUODENOSCOPY (EGD) WITH PROPOFOL N/A 07/06/2021   Procedure: ESOPHAGOGASTRODUODENOSCOPY (EGD) WITH PROPOFOL;  Surgeon: Toney Reil, MD;  Location: [facility] ENDOSCOPY;  Service: Gastroenterology;  Laterality: N/A;   GIVENS CAPSULE STUDY N/A 07/06/2021   Procedure: GIVENS CAPSULE STUDY;  Surgeon: Toney Reil, MD;  Location: [facility] ENDOSCOPY;  Service: Gastroenterology;  Laterality: N/A;   IR REMOVAL TUN ACCESS W/ PORT W/O FL MOD SED  02/15/2022   PORTACATH PLACEMENT Right 07/22/2021   Procedure: INSERTION PORT-A-CATH;  Surgeon: Leafy Ro, MD;  Location: [facility] ORS;  Service: General;   Laterality: Right;    Social History   Socioeconomic History   Marital status: Single    Spouse name: Not on file   Number of children: Not on file   Years of education: Not on file   Highest education level: Not on file  Occupational History   Not on file  Tobacco Use   Smoking status: Every Day    Current packs/day: 0.25    Types: Cigarettes   Smokeless tobacco: Current   Tobacco comments:    4 cigarettes a day - khj 09/28/2022  Vaping Use   Vaping status: Every Day   Devices: elfbar  Substance and Sexual Activity   Alcohol use: Not Currently   Drug use: Not Currently   Sexual activity: Not Currently  Other Topics Concern   Not on file  Social History Narrative   Not on file   Social Determinants of Health   Financial Resource Strain: Not on file  Food Insecurity: Not on file  Transportation Needs: Not on file  Physical Activity: Not on file  Stress: Not on file  Social Connections: Not on file  Intimate Partner Violence: Not on file    Family History  Problem Relation Age of Onset   Kidney cancer Father    Breast cancer Maternal Aunt      Current Outpatient Medications:    ARIPiprazole (ABILIFY) 10 MG tablet, Take 10 mg by mouth in the morning., Disp: , Rfl:    ARIPiprazole ER (ABILIFY MAINTENA) 400 MG PRSY prefilled syringe, Inject 400 mg into the muscle every 30 (thirty) days., Disp: , Rfl:    benztropine (COGENTIN) 2 MG tablet, Take  2 mg by mouth at bedtime., Disp: , Rfl:    busPIRone (BUSPAR) 10 MG tablet, Take 10 mg by mouth 2 (two) times daily., Disp: , Rfl:    FLUoxetine (PROZAC) 20 MG capsule, Take 20 mg by mouth daily., Disp: , Rfl:    ibuprofen (ADVIL) 600 MG tablet, Take 600 mg by mouth every 6 (six) hours as needed for moderate pain., Disp: , Rfl:    Melatonin 10 MG CAPS, Take 10 mg by mouth at bedtime., Disp: , Rfl:    ondansetron (ZOFRAN) 8 MG tablet, Take 8 mg by mouth 2 (two) times daily as needed for nausea or vomiting., Disp: , Rfl:     polyethylene glycol (MIRALAX / GLYCOLAX) 17 g packet, Take 17 g by mouth daily as needed (constipation)., Disp: 14 each, Rfl: 0   prazosin (MINIPRESS) 2 MG capsule, Take 2 mg by mouth 2 (two) times daily., Disp: , Rfl:    prochlorperazine (COMPAZINE) 10 MG tablet, Take 10 mg by mouth every 6 (six) hours as needed for nausea or vomiting., Disp: , Rfl:    lidocaine-prilocaine (EMLA) cream, Apply 1 Application topically See admin instructions. Apply to affected area once as directed (Patient not taking: Reported on 10/26/2022), Disp: , Rfl:    oxyCODONE (OXY IR/ROXICODONE) 5 MG immediate release tablet, Take 5 mg by mouth every 6 (six) hours as needed for severe pain or breakthrough pain. (Patient not taking: Reported on 10/26/2022), Disp: , Rfl:   Physical exam:  Vitals:   10/26/22 1314  BP: 117/74  Pulse: 80  Resp: 19  Temp: (!) 96.3 F (35.7 C)  SpO2: 99%  Weight: 192 lb 11.2 oz (87.4 kg)   Physical Exam Cardiovascular:     Rate and Rhythm: Normal rate and regular rhythm.     Heart sounds: Normal heart sounds.  Pulmonary:     Effort: Pulmonary effort is normal.     Breath sounds: Normal breath sounds.  Abdominal:     General: Bowel sounds are normal.     Palpations: Abdomen is soft.  Skin:    General: Skin is warm and dry.  Neurological:     Mental Status: He is alert and oriented to person, place, and time.         Latest Ref Rng & Units 09/25/2022    1:34 PM  CMP  Glucose 70 - 99 mg/dL 604   BUN 6 - 20 mg/dL 14   Creatinine 5.40 - 1.24 mg/dL 9.81   Sodium 191 - 478 mmol/L 136   Potassium 3.5 - 5.1 mmol/L 4.0   Chloride 98 - 111 mmol/L 107   CO2 22 - 32 mmol/L 25   Calcium 8.9 - 10.3 mg/dL 8.4   Total Protein 6.5 - 8.1 g/dL 7.0   Total Bilirubin 0.3 - 1.2 mg/dL 0.4   Alkaline Phos 38 - 126 U/L 66   AST 15 - 41 U/L 31   ALT 0 - 44 U/L 32       Latest Ref Rng & Units 09/25/2022    1:34 PM  CBC  WBC 4.0 - 10.5 K/uL 5.9   Hemoglobin 13.0 - 17.0 g/dL 29.5    Hematocrit 62.1 - 52.0 % 44.4   Platelets 150 - 400 K/uL 199     No images are attached to the encounter.  CT Super D Chest Wo Contrast  Result Date: 10/16/2022 CLINICAL DATA:  Planning CT for bronchoscopy. EXAM: CT CHEST WITHOUT CONTRAST TECHNIQUE: Multidetector CT imaging of the  chest was performed using thin slice collimation for electromagnetic bronchoscopy planning purposes, without intravenous contrast. RADIATION DOSE REDUCTION: This exam was performed according to the departmental dose-optimization program which includes automated exposure control, adjustment of the mA and/or kV according to patient size and/or use of iterative reconstruction technique. COMPARISON:  PET-CT 09/28/2022.  Chest CT 09/19/2022. FINDINGS: Cardiovascular: The heart size is normal. No substantial pericardial effusion. Mediastinum/Nodes: No mediastinal lymphadenopathy. No evidence for gross hilar lymphadenopathy although assessment is limited by the lack of intravenous contrast on the current study. The esophagus has normal imaging features. There is no axillary lymphadenopathy. Lungs/Pleura: Anterior left lower lobe pulmonary nodule measures 17 x 16 mm today on image 111/3, increased from 15 x 14 mm on recent PET-CT. 7 mm subpleural paraspinal left lower lobe nodule on 106/3 is similar to prior. No other suspicious pulmonary nodule or mass. No focal airspace consolidation. No pleural effusion. Upper Abdomen: Visualized portion of the upper abdomen is unremarkable. Musculoskeletal: No worrisome lytic or sclerotic osseous abnormality. IMPRESSION: 1. 17 x 16 mm anterior left lower lobe pulmonary nodule, increased from 15 x 14 mm on recent PET-CT. 2. 7 mm subpleural paraspinal left lower lobe nodule is similar to prior. Electronically Signed   By: Kennith Center M.D.   On: 10/16/2022 10:54   DG Chest Port 1 View  Result Date: 10/12/2022 CLINICAL DATA:  Bronchoscopic biopsy EXAM: PORTABLE CHEST 1 VIEW COMPARISON:  Previous  studies including CT chest done on 10/11/2022 FINDINGS: Cardiac size is within normal limits. There are no signs of pulmonary edema or focal pulmonary consolidation. Nodules in left lower lobe seen in previous CT could not be distinctly identified. There is no pleural effusion or pneumothorax. IMPRESSION: There are no focal infiltrates. There is no pleural effusion or pneumothorax. Electronically Signed   By: Ernie Avena M.D.   On: 10/12/2022 11:55   DG C-ARM BRONCHOSCOPY  Result Date: 10/12/2022 C-ARM BRONCHOSCOPY: Fluoroscopy was utilized by the requesting physician.  No radiographic interpretation.     Assessment and plan- Patient is a 37 y.o. male here to discuss biopsy results and further management  Patient has history of stage III colon cancer which was treated with adjuvant chemotherapy ending in September 2023.  Presently his CEA is trending up which is concerning for disease recurrence.  He had a CT and PET CT scan.  PET scan showed left lower lobe lung nodule measuring 15 mm with an SUV of 2.1 which was new as compared to prior scans and concerning for new area of metastatic disease.  There was also small periaortic adenopathy with an SUV of 8.3 concerning for nodal metastases.  Patient had a bronchoscopy guided lung biopsy which was negative for malignancy.  I discussed this case with Dr. Aundria Rud and the lesion was appropriately targeted.  We therefore do not have any biopsy confirmation of recurrent/metastatic disease at this time and I would not like to offer him systemic chemotherapy on the basis of elevated CEA alone.  I discussed this case with interventional radiology as well and they are willing to attempt CT-guided biopsy of the periaortic lymph node.  We will arrange for the same and I will see him back in about 2 to 3 weeks time after those biopsy results are back.  If that biopsy is inconclusive as well I will plan to repeat a short-term PET scan in about 4 to 6 weeks time to  see if any other lesions can be potentially targeted.   Visit Diagnosis  1. Encounter for follow-up surveillance of colon cancer      Dr. Owens Shark, MD, MPH [facility] at [facility] [phone] 10/29/2022 10:26 AM

## 2022-10-30 ENCOUNTER — Other Ambulatory Visit: Payer: Self-pay | Admitting: *Deleted

## 2022-10-30 DIAGNOSIS — R918 Other nonspecific abnormal finding of lung field: Secondary | ICD-10-CM

## 2022-10-30 DIAGNOSIS — C189 Malignant neoplasm of colon, unspecified: Secondary | ICD-10-CM

## 2022-10-30 DIAGNOSIS — R9389 Abnormal findings on diagnostic imaging of other specified body structures: Secondary | ICD-10-CM

## 2022-11-01 NOTE — Progress Notes (Signed)
Sterling Big, MD sent to Markus Daft; P Ir Procedure Requests Approved for CT guided biopsy of FDG avid left para-aortic LN.    See PET CT 09/28/22: Fused: Image 109 of Series 610 CT: Image 108 of Series 4  Hx colon cancer.  HKM       Previous Messages    ----- Message ----- From: Markus Daft Sent: 11/01/2022   9:17 AM EDT To: Ir Procedure Requests Subject: CT Biopsy                                      IR Approval Request:   Procedure:   CT guided biopsy of the periaortic lymph node  Reason:      small periaortic adenopathy with an SUV of 8.3 concerning for nodal metastases  History:       PET scan   09/28/2022  Provider:     Dr Owens Shark, MD  Provider Contact #   [facility]   [phone]

## 2022-11-03 ENCOUNTER — Telehealth: Payer: Self-pay | Admitting: *Deleted

## 2022-11-03 NOTE — Telephone Encounter (Signed)
Called the family care Home and asked if they can bring him over to [facility] at the heart and vascular for biopsy.  The bx is for 11/06/2022. NPO 6 hours , as well as he needs to have someone to be with him going back home and it we do not count a person that his transport pt. Back to family home. Need a person to watch him make sure he is ok after the procedure was done. They can do that , and the IR is ok with this also.

## 2022-11-05 ENCOUNTER — Other Ambulatory Visit: Payer: Self-pay | Admitting: Student

## 2022-11-05 DIAGNOSIS — C189 Malignant neoplasm of colon, unspecified: Secondary | ICD-10-CM

## 2022-11-05 DIAGNOSIS — Z01812 Encounter for preprocedural laboratory examination: Secondary | ICD-10-CM

## 2022-11-06 ENCOUNTER — Ambulatory Visit: Admission: RE | Admit: 2022-11-06 | Payer: MEDICAID | Source: Ambulatory Visit

## 2022-11-09 ENCOUNTER — Telehealth: Payer: Self-pay | Admitting: Oncology

## 2022-11-09 ENCOUNTER — Telehealth: Payer: Self-pay | Admitting: *Deleted

## 2022-11-09 ENCOUNTER — Other Ambulatory Visit: Payer: Self-pay | Admitting: Radiology

## 2022-11-09 NOTE — Telephone Encounter (Signed)
I had called the family home and the staff- Collar says that she knows the appt 7/26 arrival at 9 am . NPO after midnight, , must have a person to ride back with pt. And a driver can't be the one that is watching him. They have all the instructions. About NPO, go to heart and vascular.

## 2022-11-09 NOTE — Progress Notes (Signed)
Patient for CT Abd Mass Biopsy on Friday 11/10/2022, Waymon Budge called and spoke with the patient's caretaker on the phone and gave pre-procedure instructions. The Caretaker was made aware to have the patient here at 9a, NPO after MN prior to procedure as well as driver post procedure/recovery/discharge. Caretaker stated understanding.  Sherry called 11/09/2022

## 2022-11-09 NOTE — H&P (Signed)
Chief Complaint: Patient was seen in consultation today for lymph node biopsy  Referring Physician(s): Rao,Archana C  Supervising Physician: Malachy Moan  Patient Status: [facility] - Out-pt  History of Present Illness: Connor Wilkinson is a 37 y.o. male with past medical history of Bipolar I disorder, PTSD, recently diagnosed with colon cancer. PET performed 09/28/22 showed: Status post right hemicolectomy.   15 mm left lower lobe nodule, suspicious for pulmonary metastasis.   Hypermetabolic small periaortic nodes, suspicious for small nodal metastases.   No evidence of left adrenal metastasis.  IR consulted for para-aortic lymph node biopsy.  Case reviewed and approved by Dr. Archer Asa.   Patient presents today in his usual state of health.  He has been NPO. He does not take blood thinners.  He is a legal ward of the state.   Past Medical History:  Diagnosis Date   Bipolar 1 disorder (HCC)    Colon cancer (HCC)    Lung nodule 09/2022   PTSD (post-traumatic stress disorder)     Past Surgical History:  Procedure Laterality Date   BRONCHIAL NEEDLE ASPIRATION BIOPSY  10/12/2022   Procedure: BRONCHIAL NEEDLE ASPIRATION BIOPSIES;  Surgeon: Raechel Chute, MD;  Location: [facility] ENDOSCOPY;  Service: Pulmonary;;   COLON RESECTION N/A 07/12/2021   Procedure: HAND ASSISTED LAPAROSCOPIC COLON RESECTION;  Surgeon: Leafy Ro, MD;  Location: [facility] ORS;  Service: General;  Laterality: N/A;   COLONOSCOPY WITH PROPOFOL N/A 07/06/2021   Procedure: COLONOSCOPY WITH PROPOFOL;  Surgeon: Toney Reil, MD;  Location: [facility] ENDOSCOPY;  Service: Gastroenterology;  Laterality: N/A;   COLONOSCOPY WITH PROPOFOL N/A 07/07/2021   Procedure: COLONOSCOPY WITH PROPOFOL;  Surgeon: Toney Reil, MD;  Location: [facility] ENDOSCOPY;  Service: Gastroenterology;  Laterality: N/A;   COLONOSCOPY WITH PROPOFOL N/A 08/02/2022   Procedure: COLONOSCOPY WITH PROPOFOL;  Surgeon: Toney Reil, MD;   Location: [facility] ENDOSCOPY;  Service: Gastroenterology;  Laterality: N/AEarvin Hansen (Caregiver & Transporter) [phone] [name] [phone] will be available for phone consent   ESOPHAGOGASTRODUODENOSCOPY (EGD) WITH PROPOFOL N/A 07/06/2021   Procedure: ESOPHAGOGASTRODUODENOSCOPY (EGD) WITH PROPOFOL;  Surgeon: Toney Reil, MD;  Location: [facility] ENDOSCOPY;  Service: Gastroenterology;  Laterality: N/A;   GIVENS CAPSULE STUDY N/A 07/06/2021   Procedure: GIVENS CAPSULE STUDY;  Surgeon: Toney Reil, MD;  Location: [facility] ENDOSCOPY;  Service: Gastroenterology;  Laterality: N/A;   IR REMOVAL TUN ACCESS W/ PORT W/O FL MOD SED  02/15/2022   PORTACATH PLACEMENT Right 07/22/2021   Procedure: INSERTION PORT-A-CATH;  Surgeon: Leafy Ro, MD;  Location: [facility] ORS;  Service: General;  Laterality: Right;    Allergies: Geodon [ziprasidone hcl]  Medications: Prior to Admission medications   Medication Sig Start Date End Date Taking? Authorizing Provider  ARIPiprazole (ABILIFY) 10 MG tablet Take 10 mg by mouth in the morning.    [provider]  ARIPiprazole ER (ABILIFY MAINTENA) 400 MG PRSY prefilled syringe Inject 400 mg into the muscle every 30 (thirty) days.    [provider]  benztropine (COGENTIN) 2 MG tablet Take 2 mg by mouth at bedtime.    [provider]  busPIRone (BUSPAR) 10 MG tablet Take 10 mg by mouth 2 (two) times daily.    [provider]  FLUoxetine (PROZAC) 20 MG capsule Take 20 mg by mouth daily.    [provider]  ibuprofen (ADVIL) 600 MG tablet Take 600 mg by mouth every 6 (six) hours as needed for moderate pain.    [provider]  lidocaine-prilocaine (EMLA) cream Apply 1 Application topically See admin instructions. Apply to affected area once as directed Patient not taking: Reported on 10/26/2022    [provider]  Melatonin 10 MG CAPS Take 10 mg by mouth at bedtime.    [provider]   ondansetron (ZOFRAN) 8 MG tablet Take 8 mg by mouth 2 (two) times daily as needed for nausea or vomiting.    [provider]  oxyCODONE (OXY IR/ROXICODONE) 5 MG immediate release tablet Take 5 mg by mouth every 6 (six) hours as needed for severe pain or breakthrough pain. Patient not taking: Reported on 10/26/2022    [provider]  polyethylene glycol (MIRALAX / GLYCOLAX) 17 g packet Take 17 g by mouth daily as needed (constipation). 07/15/21   Wouk, Wilfred Curtis, MD  prazosin (MINIPRESS) 2 MG capsule Take 2 mg by mouth 2 (two) times daily.    Antonieta Iba, MD  prochlorperazine (COMPAZINE) 10 MG tablet Take 10 mg by mouth every 6 (six) hours as needed for nausea or vomiting.    [provider]     Family History  Problem Relation Age of Onset   Kidney cancer Father    Breast cancer Maternal Aunt     Social History   Socioeconomic History   Marital status: Single    Spouse name: Not on file   Number of children: Not on file   Years of education: Not on file   Highest education level: Not on file  Occupational History   Not on file  Tobacco Use   Smoking status: Every Day    Current packs/day: 0.25    Types: Cigarettes   Smokeless tobacco: Current   Tobacco comments:    4 cigarettes a day - khj 09/28/2022  Vaping Use   Vaping status: Every Day   Devices: elfbar  Substance and Sexual Activity   Alcohol use: Not Currently   Drug use: Not Currently   Sexual activity: Not Currently  Other Topics Concern   Not on file  Social History Narrative   Not on file   Social Determinants of Health   Financial Resource Strain: Not on file  Food Insecurity: Not on file  Transportation Needs: Not on file  Physical Activity: Not on file  Stress: Not on file  Social Connections: Not on file     Review of Systems: A 12 point ROS discussed and pertinent positives are indicated in the HPI above.  All other systems are negative.  Review of  Systems  Vital Signs: There were no vitals taken for this visit.  Physical Exam       Imaging: CT Super D Chest Wo Contrast  Result Date: 10/16/2022 CLINICAL DATA:  Planning CT for bronchoscopy. EXAM: CT CHEST WITHOUT CONTRAST TECHNIQUE: Multidetector CT imaging of the chest was performed using thin slice collimation for electromagnetic bronchoscopy planning purposes, without intravenous contrast. RADIATION DOSE REDUCTION: This exam was performed according to the departmental dose-optimization program which includes automated exposure control, adjustment of the mA and/or kV according to patient size and/or use of iterative reconstruction technique. COMPARISON:  PET-CT 09/28/2022.  Chest CT 09/19/2022. FINDINGS: Cardiovascular: The heart size is normal. No substantial pericardial effusion. Mediastinum/Nodes: No mediastinal lymphadenopathy. No evidence for gross hilar lymphadenopathy although assessment is limited by the lack of intravenous contrast on the current study. The esophagus has normal imaging features. There is no axillary lymphadenopathy. Lungs/Pleura: Anterior left lower lobe pulmonary nodule measures 17 x 16 mm today on  image 111/3, increased from 15 x 14 mm on recent PET-CT. 7 mm subpleural paraspinal left lower lobe nodule on 106/3 is similar to prior. No other suspicious pulmonary nodule or mass. No focal airspace consolidation. No pleural effusion. Upper Abdomen: Visualized portion of the upper abdomen is unremarkable. Musculoskeletal: No worrisome lytic or sclerotic osseous abnormality. IMPRESSION: 1. 17 x 16 mm anterior left lower lobe pulmonary nodule, increased from 15 x 14 mm on recent PET-CT. 2. 7 mm subpleural paraspinal left lower lobe nodule is similar to prior. Electronically Signed   By: Kennith Center M.D.   On: 10/16/2022 10:54   DG Chest Port 1 View  Result Date: 10/12/2022 CLINICAL DATA:  Bronchoscopic biopsy EXAM: PORTABLE CHEST 1 VIEW COMPARISON:  Previous studies  including CT chest done on 10/11/2022 FINDINGS: Cardiac size is within normal limits. There are no signs of pulmonary edema or focal pulmonary consolidation. Nodules in left lower lobe seen in previous CT could not be distinctly identified. There is no pleural effusion or pneumothorax. IMPRESSION: There are no focal infiltrates. There is no pleural effusion or pneumothorax. Electronically Signed   By: Ernie Avena M.D.   On: 10/12/2022 11:55   DG C-ARM BRONCHOSCOPY  Result Date: 10/12/2022 C-ARM BRONCHOSCOPY: Fluoroscopy was utilized by the requesting physician.  No radiographic interpretation.    Labs:  CBC: Recent Labs    02/23/22 1425 03/02/22 1452 05/24/22 1347 09/25/22 1334  WBC 5.5 4.9 4.2 5.9  HGB 14.1 13.5 13.9 14.9  HCT 42.0 40.0 42.4 44.4  PLT 190 173 220 199    COAGS: No results for input(s): "INR", "APTT" in the last 8760 hours.  BMP: Recent Labs    02/17/22 1400 02/21/22 1417 05/24/22 1347 09/25/22 1334  NA 138 139 138 136  K 4.1 4.3 4.0 4.0  CL 107 103 106 107  CO2 24 28 25 25   GLUCOSE 104* 97 98 108*  BUN 12 13 12 14   CALCIUM 9.3 9.5 8.9 8.4*  CREATININE 0.75 0.84 0.92 0.90  GFRNONAA >60 >60 >60 >60    LIVER FUNCTION TESTS: Recent Labs    02/17/22 1400 02/21/22 1417 05/24/22 1347 09/25/22 1334  BILITOT 1.0 0.4 0.3 0.4  AST 18 23 21 31   ALT 18 17 17  32  ALKPHOS 69 65 64 66  PROT 7.6 7.2 6.8 7.0  ALBUMIN 4.4 4.3 4.3 4.3    TUMOR MARKERS: No results for input(s): "AFPTM", "CEA", "CA199", "CHROMGRNA" in the last 8760 hours.  Assessment and Plan: Patient with past medical history of colon cancer s/p hemicolectomy presents with complaint of concerns for lymphadenopathy on recent PET scan.  IR consulted for lymph node biopsy at the request of Dr. Smith Robert. Case reviewed by Dr. Archer Asa who approves patient for procedure.  Patient presents today in their usual state of health.  He has been NPO and is not currently on blood thinners.   He  is a resident at the [facility] where he has care and supervision.   Risks and benefits of biopsy was discussed with the patient's legal guardian and/or patient's family including, but not limited to bleeding, infection, damage to adjacent structures or low yield requiring additional tests.  All of the questions were answered and there is agreement to proceed.  Consent signed and in chart.  Thank you for this interesting consult.  I greatly enjoyed meeting Connor Bolser and look forward to participating in their care.  A copy of this report was sent to the requesting provider on  this date.  Electronically Signed: Hoyt Koch, PA 11/09/2022, 3:27 PM   I spent a total of  30 Minutes   in face to face in clinical consultation, greater than 50% of which was counseling/coordinating care for lymphadenopathy.

## 2022-11-09 NOTE — Telephone Encounter (Signed)
Received a message on my desk phone from [place], Connor Wilkinson's legal guardian, she has a question about the procedure that was scheduled for tomorrow. The ct biopsy. She would like someone to give her a call back [phone]

## 2022-11-10 ENCOUNTER — Ambulatory Visit
Admission: RE | Admit: 2022-11-10 | Discharge: 2022-11-10 | Disposition: A | Discharge status: Home / Self Care | Payer: MEDICAID | Source: Ambulatory Visit | Attending: Oncology | Admitting: Oncology

## 2022-11-10 ENCOUNTER — Ambulatory Visit: Payer: MEDICAID

## 2022-11-23 ENCOUNTER — Telehealth: Payer: Self-pay

## 2022-11-23 NOTE — Telephone Encounter (Signed)
Assisted living Facility called stating since patient is not on chemo anymore if the office could send [facility] a fax for DC orders of Zofran, compazine, oxycodone, and ibuprofen.

## 2022-11-27 ENCOUNTER — Ambulatory Visit
Admission: RE | Admit: 2022-11-27 | Discharge: 2022-11-27 | Disposition: A | Discharge status: Home / Self Care | Payer: MEDICAID | Source: Ambulatory Visit | Attending: Oncology | Admitting: Oncology

## 2022-11-27 ENCOUNTER — Telehealth: Payer: Self-pay | Admitting: *Deleted

## 2022-11-27 DIAGNOSIS — C772 Secondary and unspecified malignant neoplasm of intra-abdominal lymph nodes: Secondary | ICD-10-CM | POA: Diagnosis not present

## 2022-11-27 DIAGNOSIS — Z08 Encounter for follow-up examination after completed treatment for malignant neoplasm: Secondary | ICD-10-CM | POA: Insufficient documentation

## 2022-11-27 DIAGNOSIS — Z85038 Personal history of other malignant neoplasm of large intestine: Secondary | ICD-10-CM | POA: Diagnosis present

## 2022-11-27 DIAGNOSIS — R918 Other nonspecific abnormal finding of lung field: Secondary | ICD-10-CM | POA: Diagnosis not present

## 2022-11-27 LAB — GLUCOSE, CAPILLARY: Glucose-Capillary: 88 mg/dL (ref 70–99)

## 2022-11-27 MED ORDER — FLUDEOXYGLUCOSE F - 18 (FDG) INJECTION
10.3500 | Freq: Once | INTRAVENOUS | Status: AC | PRN
  Administered 2022-11-27: 10.35 via INTRAVENOUS

## 2022-11-27 NOTE — Telephone Encounter (Signed)
Called over to the place that Connor Wilkinson lives in a family home and spoke to Connor Wilkinson to see if we can now set up an appointment for a biopsy.  I did tell Connor Wilkinson that they DSS person in charge of him is Connor Wilkinson.  Connor Wilkinson talk to Connor Wilkinson the date of the biopsy as well as the PA that was going to do the procedure called the day before and Connor Wilkinson says that we have to wait a little bit longer and then she will be fine with the biopsy.  We now have a biopsy that could be done on August 19.  Then I got another note that they are going to have the look at August 21 instead of August 19.  He can get it 8/21 arrival at 9 am .

## 2022-11-30 ENCOUNTER — Other Ambulatory Visit: Payer: Self-pay

## 2022-12-01 ENCOUNTER — Telehealth: Payer: Self-pay | Admitting: *Deleted

## 2022-12-01 NOTE — Telephone Encounter (Signed)
Group home called to ask if they can take off the meds that he is not taking anymore since he finished his chemo. Which is zofran, compazine and oxycodone. I faxed it to the pharmacy [facility] and the transmission went through

## 2022-12-04 ENCOUNTER — Inpatient Hospital Stay: Payer: MEDICAID

## 2022-12-04 ENCOUNTER — Inpatient Hospital Stay: Payer: MEDICAID | Admitting: Oncology

## 2022-12-05 ENCOUNTER — Other Ambulatory Visit: Payer: Self-pay | Admitting: Radiology

## 2022-12-05 ENCOUNTER — Other Ambulatory Visit: Payer: Self-pay

## 2022-12-05 ENCOUNTER — Other Ambulatory Visit: Payer: Self-pay | Admitting: Student

## 2022-12-06 ENCOUNTER — Ambulatory Visit
Admission: RE | Admit: 2022-12-06 | Discharge: 2022-12-06 | Disposition: A | Discharge status: Home / Self Care | Payer: MEDICAID | Source: Ambulatory Visit | Attending: Oncology | Admitting: Oncology

## 2022-12-06 ENCOUNTER — Telehealth: Payer: Self-pay | Admitting: *Deleted

## 2022-12-06 DIAGNOSIS — C189 Malignant neoplasm of colon, unspecified: Secondary | ICD-10-CM

## 2022-12-06 DIAGNOSIS — R9389 Abnormal findings on diagnostic imaging of other specified body structures: Secondary | ICD-10-CM

## 2022-12-06 DIAGNOSIS — R918 Other nonspecific abnormal finding of lung field: Secondary | ICD-10-CM

## 2022-12-06 NOTE — Telephone Encounter (Signed)
I called her yest. To make sure that everything is good for the bx. I  called her today and said that it is ok to go forward with bx.

## 2022-12-06 NOTE — Progress Notes (Signed)
Patient arrived and stated he was told yesterday about the procedure today, but was not told to "fast" or not eat anything, and he states he did eat breakfast about 0700.  Updated charge nurse and she spoke with Radiology MD and with Radiology scheduler and patient has been rescheduled to Tuesday, August 27th, [facility], to be NPO after midnight Monday 26th, recovery post procedure will be 1-2 hours and then discharged home.  Discussed all of this with the patient and wrote it down for him.  In addition, a representative from the Cancer Center will fax information/instructions to patient's group home where he lives and update all involved.

## 2022-12-07 ENCOUNTER — Other Ambulatory Visit: Payer: Self-pay

## 2022-12-12 ENCOUNTER — Inpatient Hospital Stay: Payer: MEDICAID

## 2022-12-12 ENCOUNTER — Inpatient Hospital Stay: Payer: MEDICAID | Admitting: Oncology

## 2022-12-13 ENCOUNTER — Other Ambulatory Visit: Payer: Self-pay

## 2022-12-14 ENCOUNTER — Telehealth: Payer: Self-pay | Admitting: *Deleted

## 2022-12-14 ENCOUNTER — Ambulatory Visit: Payer: MEDICAID

## 2022-12-14 NOTE — Telephone Encounter (Signed)
I called the DSS worker that takes care of Connor Wilkinson and she is agreeable for patient to have his CT-guided biopsy on 9/ 3.

## 2022-12-15 ENCOUNTER — Other Ambulatory Visit: Payer: Self-pay | Admitting: Student

## 2022-12-15 ENCOUNTER — Telehealth: Payer: Self-pay | Admitting: *Deleted

## 2022-12-15 NOTE — Telephone Encounter (Signed)
The fax would never go through we call today and was told that fax is not working. I asked that Lorene Dy to give the directions to them. She told him the date an time 9/3 arrive 9 am for 10 am bx. Also that nothing to eat or drink for 8 hours before the biopsy. Also told them to come to the front of the hospital at the heart and vascular. He has to have a driver and and extra person to bring him on the way home and watch him to make sure he is doing ok while another person is driving. If he has to take meds that am he will just take sip of water to take meds. Lorene Dy says they have all the info.

## 2022-12-16 ENCOUNTER — Other Ambulatory Visit: Payer: Self-pay

## 2022-12-18 NOTE — H&P (Incomplete)
Chief Complaint: Hypermetabolic left para-aortic lymph node. Request is for lymph node biopsy.   Referring Physician(s): Connor Wilkinson  Supervising Physician: Connor Wilkinson  Patient Status: [facility] - Out-pt  History of Present Illness: Connor Wilkinson is a 37 y.o. male male outpatient. Knows to IR. History of PTSD, Recently diagnosed with colon cancer s/p right hemicolectomy. PET scan from 8.12.24 reads Mildly progressive abdominal/retroperitoneal lymphadenopathy, Including: ... 8 mm short axis left para-aortic node (series 4/image 112) with max SUV 7.1, previously 17 mm . Team is requesting a left periaortic lymph node biopsy for further evaluation of possible metastasis. Case approved by IR Attending Dr. Vella Wilkinson.  Currently without any significant complaints. Patient alert and laying in bed,calm. Denies any fevers, headache, chest pain, SOB, cough, abdominal pain, nausea, vomiting or bleeding. Return precautions and treatment recommendations and follow-up discussed with the patient *** who is agreeable with the plan.   Past Medical History:  Diagnosis Date   Bipolar 1 disorder (HCC)    Colon cancer (HCC)    Lung nodule 09/2022   PTSD (post-traumatic stress disorder)     Past Surgical History:  Procedure Laterality Date   BRONCHIAL NEEDLE ASPIRATION BIOPSY  10/12/2022   Procedure: BRONCHIAL NEEDLE ASPIRATION BIOPSIES;  Surgeon: Connor Chute, MD;  Location: [facility] ENDOSCOPY;  Service: Pulmonary;;   COLON RESECTION N/A 07/12/2021   Procedure: HAND ASSISTED LAPAROSCOPIC COLON RESECTION;  Surgeon: Leafy Ro, MD;  Location: [facility] ORS;  Service: General;  Laterality: N/A;   COLONOSCOPY WITH PROPOFOL N/A 07/06/2021   Procedure: COLONOSCOPY WITH PROPOFOL;  Surgeon: Toney Reil, MD;  Location: [facility] ENDOSCOPY;  Service: Gastroenterology;  Laterality: N/A;   COLONOSCOPY WITH PROPOFOL N/A 07/07/2021   Procedure: COLONOSCOPY WITH PROPOFOL;  Surgeon: Toney Reil, MD;   Location: [facility] ENDOSCOPY;  Service: Gastroenterology;  Laterality: N/A;   COLONOSCOPY WITH PROPOFOL N/A 08/02/2022   Procedure: COLONOSCOPY WITH PROPOFOL;  Surgeon: Toney Reil, MD;  Location: [facility] ENDOSCOPY;  Service: Gastroenterology;  Laterality: N/AEarvin Wilkinson (Caregiver & Transporter) [phone] [name] [phone] will be available for phone consent   ESOPHAGOGASTRODUODENOSCOPY (EGD) WITH PROPOFOL N/A 07/06/2021   Procedure: ESOPHAGOGASTRODUODENOSCOPY (EGD) WITH PROPOFOL;  Surgeon: Toney Reil, MD;  Location: [facility] ENDOSCOPY;  Service: Gastroenterology;  Laterality: N/A;   GIVENS CAPSULE STUDY N/A 07/06/2021   Procedure: GIVENS CAPSULE STUDY;  Surgeon: Toney Reil, MD;  Location: [facility] ENDOSCOPY;  Service: Gastroenterology;  Laterality: N/A;   IR REMOVAL TUN ACCESS W/ PORT W/O FL MOD SED  02/15/2022   PORTACATH PLACEMENT Right 07/22/2021   Procedure: INSERTION PORT-A-CATH;  Surgeon: Leafy Ro, MD;  Location: [facility] ORS;  Service: General;  Laterality: Right;    Allergies: Geodon [ziprasidone hcl]  Medications: Prior to Admission medications   Medication Sig Start Date End Date Taking? Authorizing Provider  ARIPiprazole (ABILIFY) 10 MG tablet Take 10 mg by mouth in the morning.    [provider]  ARIPiprazole ER (ABILIFY MAINTENA) 400 MG PRSY prefilled syringe Inject 400 mg into the muscle every 30 (thirty) days.    [provider]  benztropine (COGENTIN) 2 MG tablet Take 2 mg by mouth at bedtime.    [provider]  busPIRone (BUSPAR) 10 MG tablet Take 10 mg by mouth 2 (two) times daily.    [provider]  FLUoxetine (PROZAC) 20 MG capsule Take 20 mg by mouth daily.    [provider]  ibuprofen (ADVIL) 600 MG tablet Take 600 mg by mouth every  6 (six) hours as needed for moderate pain.    [provider]  lidocaine-prilocaine (EMLA) cream Apply 1 Application topically See admin instructions. Apply  to affected area once as directed Patient not taking: Reported on 10/26/2022    [provider]  Melatonin 10 MG CAPS Take 10 mg by mouth at bedtime.    [provider]  ondansetron (ZOFRAN) 8 MG tablet Take 8 mg by mouth 2 (two) times daily as needed for nausea or vomiting.    [provider]  oxyCODONE (OXY IR/ROXICODONE) 5 MG immediate release tablet Take 5 mg by mouth every 6 (six) hours as needed for severe pain or breakthrough pain. Patient not taking: Reported on 10/26/2022    [provider]  polyethylene glycol (MIRALAX / GLYCOLAX) 17 g packet Take 17 g by mouth daily as needed (constipation). 07/15/21   Wouk, Wilfred Curtis, MD  prazosin (MINIPRESS) 2 MG capsule Take 2 mg by mouth 2 (two) times daily.    Antonieta Iba, MD  prochlorperazine (COMPAZINE) 10 MG tablet Take 10 mg by mouth every 6 (six) hours as needed for nausea or vomiting.    [provider]     Family History  Problem Relation Age of Onset   Kidney cancer Father    Breast cancer Maternal Aunt     Social History   Socioeconomic History   Marital status: Single    Spouse name: Not on file   Number of children: Not on file   Years of education: Not on file   Highest education level: Not on file  Occupational History   Not on file  Tobacco Use   Smoking status: Every Day    Current packs/day: 0.25    Types: Cigarettes   Smokeless tobacco: Current   Tobacco comments:    4 cigarettes a day - khj 09/28/2022  Vaping Use   Vaping status: Every Day   Devices: elfbar  Substance and Sexual Activity   Alcohol use: Not Currently   Drug use: Not Currently   Sexual activity: Not Currently  Other Topics Concern   Not on file  Social History Narrative   Not on file   Social Determinants of Health   Financial Resource Strain: Not on file  Food Insecurity: Not on file  Transportation Needs: Not on file  Physical Activity: Not on file  Stress: Not on file  Social  Connections: Not on file    ECOG Status: {CHL ONC ECOG RU:[id number]}  Review of Systems: A 12 point ROS discussed and pertinent positives are indicated in the HPI above.  All other systems are negative.  Review of Systems  Vital Signs: There were no vitals taken for this visit.    Physical Exam  Imaging: NM PET Image Restag (PS) Skull Base To Thigh  Result Date: 12/02/2022 CLINICAL DATA:  Subsequent treatment strategy for colon cancer. EXAM: NUCLEAR MEDICINE PET SKULL BASE TO THIGH TECHNIQUE: 10.4 mCi F-18 FDG was injected intravenously. Full-ring PET imaging was performed from the skull base to thigh after the radiotracer. CT data was obtained and used for attenuation correction and anatomic localization. Fasting blood glucose: 88 mg/dl COMPARISON:  CT chest dated 10/11/2022.  PET-CT dated 09/28/2022. FINDINGS: Mediastinal blood pool activity: SUV max 2.1 Liver activity: SUV max NA NECK: No hypermetabolic cervical lymphadenopathy. Incidental CT findings: None. CHEST: 18 x 16 mm left lower lobe nodule (series 4/image 68), previously 17 x 16 mm, max SUV 2.4. No hypermetabolic thoracic lymphadenopathy. Incidental CT  findings: None. ABDOMEN/PELVIS: Status post right hemicolectomy. No abnormal hypermetabolism in the liver, spleen, pancreas, or adrenal glands. Mildly progressive abdominal/retroperitoneal lymphadenopathy, including: --6 mm short axis right retrocrural node (series 4/image 90) with max SUV 6.4, previously 5 mm --Clustered aortocaval nodes measuring up to 15 mm short axis (series 4/image 102) with max SUV 7.9, previously 8 mm --18 mm short axis left para-aortic node (series 4/image 112) with max SUV 7.1, previously 17 mm Incidental CT findings: None. SKELETON: No focal hypermetabolic activity to suggest skeletal metastasis. Incidental CT findings: None. IMPRESSION: Status post right hemicolectomy. Mildly progressive abdominal/retroperitoneal nodal metastases, as above. 18 mm left lower  lobe nodule, minimally progressive, suspicious for pulmonary metastasis. Electronically Signed   By: Charline Bills M.D.   On: 12/02/2022 01:03    Labs:  CBC: Recent Labs    02/23/22 1425 03/02/22 1452 05/24/22 1347 09/25/22 1334  WBC 5.5 4.9 4.2 5.9  HGB 14.1 13.5 13.9 14.9  HCT 42.0 40.0 42.4 44.4  PLT 190 173 220 199    COAGS: No results for input(s): "INR", "APTT" in the last 8760 hours.  BMP: Recent Labs    02/17/22 1400 02/21/22 1417 05/24/22 1347 09/25/22 1334  NA 138 139 138 136  K 4.1 4.3 4.0 4.0  CL 107 103 106 107  CO2 24 28 25 25   GLUCOSE 104* 97 98 108*  BUN 12 13 12 14   CALCIUM 9.3 9.5 8.9 8.4*  CREATININE 0.75 0.84 0.92 0.90  GFRNONAA >60 >60 >60 >60    LIVER FUNCTION TESTS: Recent Labs    02/17/22 1400 02/21/22 1417 05/24/22 1347 09/25/22 1334  BILITOT 1.0 0.4 0.3 0.4  AST 18 23 21 31   ALT 18 17 17  32  ALKPHOS 69 65 64 66  PROT 7.6 7.2 6.8 7.0  ALBUMIN 4.4 4.3 4.3 4.3      Assessment and Plan:  37 y.o. male outpatient. Knows to IR. History of PTSD, Recently diagnosed with colon cancer s/p right hemicolectomy. PET scan from 8.12.24 reads Mildly progressive abdominal/retroperitoneal lymphadenopathy, Including: ... 8 mm short axis left para-aortic node (series 4/image 112) with max SUV 7.1, previously 17 mm . Team is requesting a left periaortic lymph node biopsy for further evaluation of possible metastasis. Case approved by IR Attending Dr. Vella Wilkinson  IR performed a LLL FNA  on 6.28.24. Cytology resulted in malignant cells identified.  ***All labs and medications are within acceptable parameters. No pertinent allergies. Patient has been NPO since midnight.   Risks and benefits of left para- aortic lymph node biopsy  was discussed with the patient and/or patient's family including, but not limited to bleeding, infection, damage to adjacent structures or low yield requiring additional tests.  All of the questions were answered and  there is agreement to proceed.  Consent signed and in chart.  Thank you for this interesting consult.  I greatly enjoyed meeting Connor Wilkinson and look forward to participating in their care.  A copy of this report was sent to the requesting provider on this date.  Electronically Signed: Alene Mires, NP 12/18/2022, 8:45 PM   I spent a total of {New ZOXW:[id number]} {New Out-Pt:[id number]}  {Established Out-Pt:[id number]} in face to face in clinical consultation, greater than 50% of which was counseling/coordinating care for ***

## 2022-12-19 ENCOUNTER — Ambulatory Visit
Admission: RE | Admit: 2022-12-19 | Discharge: 2022-12-19 | Disposition: A | Discharge status: Home / Self Care | Payer: MEDICAID | Source: Ambulatory Visit | Attending: Oncology | Admitting: Oncology

## 2022-12-19 ENCOUNTER — Telehealth: Payer: Self-pay | Admitting: Oncology

## 2022-12-19 NOTE — Telephone Encounter (Signed)
Per Onalee Hua, family friend, stated that Biopsy has been scheduled due to pt not knowing about the fasting prior to procedure. They have requested help with rescheduling.

## 2022-12-21 ENCOUNTER — Telehealth: Payer: Self-pay | Admitting: *Deleted

## 2022-12-21 NOTE — Telephone Encounter (Signed)
Assisted living called today and said they need a new appointment for the patient's biopsy.  I called over there and spoke to [place] today and let him know that I have already requested a new date but she has not give it to [place] a date yet yet.  I told Minerva Areola I will let him know as soon as I know

## 2022-12-27 ENCOUNTER — Other Ambulatory Visit: Payer: Self-pay

## 2022-12-30 ENCOUNTER — Telehealth: Payer: Self-pay | Admitting: *Deleted

## 2022-12-30 NOTE — Telephone Encounter (Signed)
I called and spoke to [place] about pt. Biopsy and went over the instructions and Minerva Areola said he already wrote them down the last time I called and Minerva Areola will be bringing him to get bx. I asked him to make a sheet of paper that the. Pt. Should not eat or drink past midnight Sunday. I asked Minerva Areola to remind him and also to put a paper in his room so he can see it . This would be the 4th time to try to get the biopsy and twice it was because he ate that morning of the biopsy. Minerva Areola knows all of this.

## 2022-12-31 ENCOUNTER — Other Ambulatory Visit: Payer: Self-pay

## 2023-01-01 ENCOUNTER — Ambulatory Visit
Admission: RE | Admit: 2023-01-01 | Discharge: 2023-01-01 | Disposition: A | Discharge status: Home / Self Care | Payer: MEDICAID | Source: Ambulatory Visit | Attending: Oncology

## 2023-01-01 DIAGNOSIS — F17290 Nicotine dependence, other tobacco product, uncomplicated: Secondary | ICD-10-CM | POA: Diagnosis not present

## 2023-01-01 DIAGNOSIS — C189 Malignant neoplasm of colon, unspecified: Secondary | ICD-10-CM | POA: Insufficient documentation

## 2023-01-01 DIAGNOSIS — R590 Localized enlarged lymph nodes: Secondary | ICD-10-CM | POA: Diagnosis present

## 2023-01-01 DIAGNOSIS — F4310 Post-traumatic stress disorder, unspecified: Secondary | ICD-10-CM | POA: Insufficient documentation

## 2023-01-01 DIAGNOSIS — F17210 Nicotine dependence, cigarettes, uncomplicated: Secondary | ICD-10-CM | POA: Insufficient documentation

## 2023-01-01 DIAGNOSIS — C772 Secondary and unspecified malignant neoplasm of intra-abdominal lymph nodes: Secondary | ICD-10-CM | POA: Diagnosis not present

## 2023-01-01 DIAGNOSIS — Z01812 Encounter for preprocedural laboratory examination: Secondary | ICD-10-CM

## 2023-01-01 DIAGNOSIS — F319 Bipolar disorder, unspecified: Secondary | ICD-10-CM | POA: Insufficient documentation

## 2023-01-01 LAB — CBC
HCT: 43.1 % (ref 39.0–52.0)
Hemoglobin: 14.7 g/dL (ref 13.0–17.0)
MCH: 31.5 pg (ref 26.0–34.0)
MCHC: 34.1 g/dL (ref 30.0–36.0)
MCV: 92.3 fL (ref 80.0–100.0)
Platelets: 213 10*3/uL (ref 150–400)
RBC: 4.67 MIL/uL (ref 4.22–5.81)
RDW: 12.4 % (ref 11.5–15.5)
WBC: 5.7 10*3/uL (ref 4.0–10.5)
nRBC: 0 % (ref 0.0–0.2)

## 2023-01-01 LAB — PROTIME-INR
INR: 1 (ref 0.8–1.2)
Prothrombin Time: 13 s (ref 11.4–15.2)

## 2023-01-01 MED ORDER — ACETAMINOPHEN 325 MG PO TABS: 650.0000 mg | ORAL_TABLET | Freq: Four times a day (QID) | ORAL | 0 refills | Status: AC | PRN

## 2023-01-01 MED ORDER — FENTANYL CITRATE (PF) 100 MCG/2ML IJ SOLN
INTRAMUSCULAR | Status: AC
  Filled 2023-01-01: qty 2

## 2023-01-01 MED ORDER — FENTANYL CITRATE (PF) 100 MCG/2ML IJ SOLN
INTRAMUSCULAR | Status: AC | PRN
  Administered 2023-01-01: 50 ug via INTRAVENOUS

## 2023-01-01 MED ORDER — SODIUM CHLORIDE 0.9 % IV SOLN: INTRAVENOUS | Status: DC

## 2023-01-01 MED ORDER — MIDAZOLAM HCL 2 MG/2ML IJ SOLN
INTRAMUSCULAR | Status: AC
  Filled 2023-01-01: qty 2

## 2023-01-01 MED ORDER — MIDAZOLAM HCL 2 MG/2ML IJ SOLN
INTRAMUSCULAR | Status: AC | PRN
  Administered 2023-01-01: 1 mg via INTRAVENOUS

## 2023-01-01 NOTE — Progress Notes (Signed)
Patient clinically stable post CT Abdominal Mass biopsy per Dr Juliette Alcide, tolerated well. Vitals stable pre and post procedure. Received Versed 1 mg along with Fentanyl 50 mcg IV for procedure.report given to [facility] RN post procedure/specials/18.

## 2023-01-01 NOTE — Procedures (Signed)
Interventional Radiology Procedure Note  Date of Procedure: 01/01/2023  Procedure: CT retroperitoneal LN biopsy   Findings:  1. CT core needle biopsy left RP LN 18ga x6 passes    Complications: No immediate complications noted.   Estimated Blood Loss: minimal  Follow-up and Recommendations: 1. Bedrest 2 hours    Olive Bass, MD  Vascular & Interventional Radiology  01/01/2023 11:52 AM

## 2023-01-01 NOTE — H&P (Signed)
Chief Complaint: Patient was seen in consultation today for para-aortic lymphadenopathy  Referring Physician(s): Rao,Archana C  Supervising Physician: Pernell Dupre  Patient Status: [facility] - Out-pt  History of Present Illness: Connor Wilkinson is a 37 y.o. male with PMH for bipolar disorder, colon cancer, and PTSD being seen today in relation to para-aortic lymphadenopathy with known colon cancer. Patient is under the care of Dr Smith Robert from Oncology service who has referred patient to IR for image-guided periaortic lymph node biopsy.   Past Medical History:  Diagnosis Date   Bipolar 1 disorder (HCC)    Colon cancer (HCC)    Lung nodule 09/2022   PTSD (post-traumatic stress disorder)     Past Surgical History:  Procedure Laterality Date   BRONCHIAL NEEDLE ASPIRATION BIOPSY  10/12/2022   Procedure: BRONCHIAL NEEDLE ASPIRATION BIOPSIES;  Surgeon: Raechel Chute, MD;  Location: [facility] ENDOSCOPY;  Service: Pulmonary;;   COLON RESECTION N/A 07/12/2021   Procedure: HAND ASSISTED LAPAROSCOPIC COLON RESECTION;  Surgeon: Leafy Ro, MD;  Location: [facility] ORS;  Service: General;  Laterality: N/A;   COLONOSCOPY WITH PROPOFOL N/A 07/06/2021   Procedure: COLONOSCOPY WITH PROPOFOL;  Surgeon: Toney Reil, MD;  Location: [facility] ENDOSCOPY;  Service: Gastroenterology;  Laterality: N/A;   COLONOSCOPY WITH PROPOFOL N/A 07/07/2021   Procedure: COLONOSCOPY WITH PROPOFOL;  Surgeon: Toney Reil, MD;  Location: [facility] ENDOSCOPY;  Service: Gastroenterology;  Laterality: N/A;   COLONOSCOPY WITH PROPOFOL N/A 08/02/2022   Procedure: COLONOSCOPY WITH PROPOFOL;  Surgeon: Toney Reil, MD;  Location: [facility] ENDOSCOPY;  Service: Gastroenterology;  Laterality: N/AEarvin Hansen (Caregiver & Transporter) [phone] [name] [phone] will be available for phone consent   ESOPHAGOGASTRODUODENOSCOPY (EGD) WITH PROPOFOL N/A 07/06/2021   Procedure: ESOPHAGOGASTRODUODENOSCOPY (EGD) WITH PROPOFOL;   Surgeon: Toney Reil, MD;  Location: [facility] ENDOSCOPY;  Service: Gastroenterology;  Laterality: N/A;   GIVENS CAPSULE STUDY N/A 07/06/2021   Procedure: GIVENS CAPSULE STUDY;  Surgeon: Toney Reil, MD;  Location: [facility] ENDOSCOPY;  Service: Gastroenterology;  Laterality: N/A;   IR REMOVAL TUN ACCESS W/ PORT W/O FL MOD SED  02/15/2022   PORTACATH PLACEMENT Right 07/22/2021   Procedure: INSERTION PORT-A-CATH;  Surgeon: Leafy Ro, MD;  Location: [facility] ORS;  Service: General;  Laterality: Right;    Allergies: Geodon [ziprasidone hcl]  Medications: Prior to Admission medications   Medication Sig Start Date End Date Taking? Authorizing Provider  ARIPiprazole (ABILIFY) 10 MG tablet Take 10 mg by mouth in the morning.    [provider]  ARIPiprazole ER (ABILIFY MAINTENA) 400 MG PRSY prefilled syringe Inject 400 mg into the muscle every 30 (thirty) days.    [provider]  benztropine (COGENTIN) 2 MG tablet Take 2 mg by mouth at bedtime.    [provider]  busPIRone (BUSPAR) 10 MG tablet Take 10 mg by mouth 2 (two) times daily.    [provider]  FLUoxetine (PROZAC) 20 MG capsule Take 20 mg by mouth daily.    [provider]  ibuprofen (ADVIL) 600 MG tablet Take 600 mg by mouth every 6 (six) hours as needed for moderate pain.    [provider]  lidocaine-prilocaine (EMLA) cream Apply 1 Application topically See admin instructions. Apply to affected area once as directed Patient not taking: Reported on 10/26/2022    [provider]  Melatonin 10 MG CAPS Take 10 mg by mouth at bedtime.    [provider]  ondansetron (ZOFRAN) 8 MG tablet Take 8  mg by mouth 2 (two) times daily as needed for nausea or vomiting.    [provider]  oxyCODONE (OXY IR/ROXICODONE) 5 MG immediate release tablet Take 5 mg by mouth every 6 (six) hours as needed for severe pain or breakthrough pain. Patient not taking: Reported on  10/26/2022    [provider]  polyethylene glycol (MIRALAX / GLYCOLAX) 17 g packet Take 17 g by mouth daily as needed (constipation). 07/15/21   Wouk, Wilfred Curtis, MD  prazosin (MINIPRESS) 2 MG capsule Take 2 mg by mouth 2 (two) times daily.    Antonieta Iba, MD  prochlorperazine (COMPAZINE) 10 MG tablet Take 10 mg by mouth every 6 (six) hours as needed for nausea or vomiting.    [provider]     Family History  Problem Relation Age of Onset   Kidney cancer Father    Breast cancer Maternal Aunt     Social History   Socioeconomic History   Marital status: Single    Spouse name: Not on file   Number of children: Not on file   Years of education: Not on file   Highest education level: Not on file  Occupational History   Not on file  Tobacco Use   Smoking status: Every Day    Current packs/day: 0.25    Types: Cigarettes   Smokeless tobacco: Current   Tobacco comments:    4 cigarettes a day - khj 09/28/2022  Vaping Use   Vaping status: Every Day   Devices: elfbar  Substance and Sexual Activity   Alcohol use: Not Currently   Drug use: Not Currently   Sexual activity: Not Currently  Other Topics Concern   Not on file  Social History Narrative   Not on file   Social Determinants of Health   Financial Resource Strain: Not on file  Food Insecurity: Not on file  Transportation Needs: Not on file  Physical Activity: Not on file  Stress: Not on file  Social Connections: Not on file    Code Status: Full code  Review of Systems: A 12 point ROS discussed and pertinent positives are indicated in the HPI above.  All other systems are negative.  Review of Systems  Constitutional:  Negative for chills and fever.  Respiratory:  Negative for chest tightness and shortness of breath.   Cardiovascular:  Negative for chest pain and leg swelling.  Gastrointestinal:  Negative for abdominal distention, diarrhea, nausea and vomiting.  Neurological:  Negative for  dizziness and headaches.  Psychiatric/Behavioral:  Negative for confusion.     Vital Signs: BP 110/74   Pulse 85   Temp 98.2 F (36.8 C) (Oral)   Resp 20   Ht 6' (1.829 m)   Wt 180 lb (81.6 kg)   SpO2 95%   BMI 24.41 kg/m     Physical Exam Vitals reviewed.  Constitutional:      General: He is not in acute distress.    Appearance: He is not ill-appearing.  Cardiovascular:     Rate and Rhythm: Normal rate and regular rhythm.     Pulses: Normal pulses.     Heart sounds: Normal heart sounds.  Pulmonary:     Effort: Pulmonary effort is normal.     Breath sounds: Normal breath sounds.  Abdominal:     Palpations: Abdomen is soft.     Tenderness: There is no abdominal tenderness.  Musculoskeletal:     Right lower leg: No edema.     Left  lower leg: No edema.  Skin:    General: Skin is warm and dry.  Neurological:     Mental Status: He is alert and oriented to person, place, and time.  Psychiatric:        Mood and Affect: Mood normal.        Behavior: Behavior normal.        Thought Content: Thought content normal.        Judgment: Judgment normal.     Imaging: No results found.  Labs:  CBC: Recent Labs    02/23/22 1425 03/02/22 1452 05/24/22 1347 09/25/22 1334  WBC 5.5 4.9 4.2 5.9  HGB 14.1 13.5 13.9 14.9  HCT 42.0 40.0 42.4 44.4  PLT 190 173 220 199    COAGS: No results for input(s): "INR", "APTT" in the last 8760 hours.  BMP: Recent Labs    02/17/22 1400 02/21/22 1417 05/24/22 1347 09/25/22 1334  NA 138 139 138 136  K 4.1 4.3 4.0 4.0  CL 107 103 106 107  CO2 24 28 25 25   GLUCOSE 104* 97 98 108*  BUN 12 13 12 14   CALCIUM 9.3 9.5 8.9 8.4*  CREATININE 0.75 0.84 0.92 0.90  GFRNONAA >60 >60 >60 >60    LIVER FUNCTION TESTS: Recent Labs    02/17/22 1400 02/21/22 1417 05/24/22 1347 09/25/22 1334  BILITOT 1.0 0.4 0.3 0.4  AST 18 23 21 31   ALT 18 17 17  32  ALKPHOS 69 65 64 66  PROT 7.6 7.2 6.8 7.0  ALBUMIN 4.4 4.3 4.3 4.3    TUMOR  MARKERS: No results for input(s): "AFPTM", "CEA", "CA199", "CHROMGRNA" in the last 8760 hours.  Assessment and Plan:  Connor Wilkinson is a 37 yo male being seen today for image-guided para-aortic lymph node biopsy. Patient has history of colon cancer and is under the care of Dr Smith Robert from Oncology service. Patient presents today in his usual state of health and is NPO. Imaging initially reviewed and approved by Dr Archer Asa. Case reviewed with Dr Juliette Alcide this morning.  Risks and benefits of para-aortic lymph node biopsy was discussed with the patient's guardian, [name], including but not limited to bleeding, infection, damage to adjacent structures or low yield requiring additional tests.  All of the questions were answered and there is agreement to proceed.  Consent obtained over the phone from [facility] and in chart.   Thank you for this interesting consult.  I greatly enjoyed meeting Connor Wilkinson and look forward to participating in their care.  A copy of this report was sent to the requesting provider on this date.  Electronically Signed: Kennieth Francois, PA-C 01/01/2023, 9:00 AM   I spent a total of  15 Minutes   in face to face in clinical consultation, greater than 50% of which was counseling/coordinating care for para aortic lymphadenopathy.

## 2023-01-01 NOTE — Progress Notes (Signed)
Patient clinically ready for discharge at 1305 however his transportation has just arrived to pickup.

## 2023-01-02 ENCOUNTER — Other Ambulatory Visit: Payer: MEDICAID

## 2023-01-02 ENCOUNTER — Ambulatory Visit: Payer: MEDICAID | Admitting: Oncology

## 2023-01-02 LAB — SURGICAL PATHOLOGY

## 2023-01-03 ENCOUNTER — Telehealth: Payer: Self-pay

## 2023-01-03 NOTE — Telephone Encounter (Signed)
[facility] requested on specimen [id number], left retroperitoneal lymph node biopsy, collected 01/01/2023

## 2023-01-09 ENCOUNTER — Inpatient Hospital Stay: Payer: MEDICAID | Attending: Oncology

## 2023-01-09 ENCOUNTER — Encounter: Payer: Self-pay | Admitting: Oncology

## 2023-01-09 ENCOUNTER — Inpatient Hospital Stay (HOSPITAL_BASED_OUTPATIENT_CLINIC_OR_DEPARTMENT_OTHER): Payer: MEDICAID | Admitting: Oncology

## 2023-01-09 VITALS — BP 114/77 | HR 90 | Temp 95.4°F | Resp 18 | Ht 72.0 in | Wt 199.5 lb

## 2023-01-09 DIAGNOSIS — Z803 Family history of malignant neoplasm of breast: Secondary | ICD-10-CM | POA: Insufficient documentation

## 2023-01-09 DIAGNOSIS — R911 Solitary pulmonary nodule: Secondary | ICD-10-CM | POA: Insufficient documentation

## 2023-01-09 DIAGNOSIS — Z923 Personal history of irradiation: Secondary | ICD-10-CM | POA: Insufficient documentation

## 2023-01-09 DIAGNOSIS — C189 Malignant neoplasm of colon, unspecified: Secondary | ICD-10-CM | POA: Diagnosis present

## 2023-01-09 DIAGNOSIS — Z9221 Personal history of antineoplastic chemotherapy: Secondary | ICD-10-CM | POA: Diagnosis not present

## 2023-01-09 DIAGNOSIS — F17210 Nicotine dependence, cigarettes, uncomplicated: Secondary | ICD-10-CM | POA: Diagnosis not present

## 2023-01-09 DIAGNOSIS — Z8051 Family history of malignant neoplasm of kidney: Secondary | ICD-10-CM | POA: Insufficient documentation

## 2023-01-09 DIAGNOSIS — C786 Secondary malignant neoplasm of retroperitoneum and peritoneum: Secondary | ICD-10-CM | POA: Diagnosis not present

## 2023-01-09 DIAGNOSIS — Z08 Encounter for follow-up examination after completed treatment for malignant neoplasm: Secondary | ICD-10-CM

## 2023-01-09 LAB — CBC WITH DIFFERENTIAL/PLATELET
Abs Immature Granulocytes: 0.01 10*3/uL (ref 0.00–0.07)
Basophils Absolute: 0 10*3/uL (ref 0.0–0.1)
Basophils Relative: 0 %
Eosinophils Absolute: 0.1 10*3/uL (ref 0.0–0.5)
Eosinophils Relative: 3 %
HCT: 42.7 % (ref 39.0–52.0)
Hemoglobin: 14.4 g/dL (ref 13.0–17.0)
Immature Granulocytes: 0 %
Lymphocytes Relative: 25 %
Lymphs Abs: 1.2 10*3/uL (ref 0.7–4.0)
MCH: 31 pg (ref 26.0–34.0)
MCHC: 33.7 g/dL (ref 30.0–36.0)
MCV: 92 fL (ref 80.0–100.0)
Monocytes Absolute: 0.3 10*3/uL (ref 0.1–1.0)
Monocytes Relative: 6 %
Neutro Abs: 3 10*3/uL (ref 1.7–7.7)
Neutrophils Relative %: 66 %
Platelets: 222 10*3/uL (ref 150–400)
RBC: 4.64 MIL/uL (ref 4.22–5.81)
RDW: 11.9 % (ref 11.5–15.5)
WBC: 4.6 10*3/uL (ref 4.0–10.5)
nRBC: 0 % (ref 0.0–0.2)

## 2023-01-09 LAB — COMPREHENSIVE METABOLIC PANEL
ALT: 24 U/L (ref 0–44)
AST: 23 U/L (ref 15–41)
Albumin: 4.1 g/dL (ref 3.5–5.0)
Alkaline Phosphatase: 60 U/L (ref 38–126)
Anion gap: 6 (ref 5–15)
BUN: 16 mg/dL (ref 6–20)
CO2: 25 mmol/L (ref 22–32)
Calcium: 9 mg/dL (ref 8.9–10.3)
Chloride: 107 mmol/L (ref 98–111)
Creatinine, Ser: 1.12 mg/dL (ref 0.61–1.24)
GFR, Estimated: >60 mL/min (ref >60)
Glucose, Bld: 97 mg/dL (ref 70–99)
Potassium: 4.1 mmol/L (ref 3.5–5.1)
Sodium: 138 mmol/L (ref 135–145)
Total Bilirubin: 0.7 mg/dL (ref 0.3–1.2)
Total Protein: 6.9 g/dL (ref 6.5–8.1)

## 2023-01-10 LAB — CEA: CEA: 8.9 ng/mL — ABNORMAL HIGH (ref 0.0–4.7)

## 2023-01-10 NOTE — Progress Notes (Signed)
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  He was found to have a hemoglobin of 4.5/18.7 on admission.  He received blood transfusion as well as IV iron and his hemoglobin is presently up to an 8.  He underwent colonoscopy which showed malignant partially obstructing tumor that was circumferential 80 cm proximal to the anus.  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  CT chest abdomen and pelvis with contrast did not show any evidence of distant metastatic disease.  2 small sclerotic densities in the acetabulum and proximal right femur likely benign process.  Pathologically enlarged lymph nodes superior to circumferential colonic lesion suggesting metastatic adenopathy.   Patient underwent right hemicolectomy with Dr. Everlene Farrier on 07/12/2021.  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  Vermiform appendix negative for tumor.  Radial margin positive for tumor (2 lymph nodes at  margin with adenocarcinoma).  Proximal and distal margins negative.  7 out of 56 lymph nodes positive for metastatic adenocarcinoma.  Tumor deposits not applicable.  No lymphovascular or perineural invasion seen.  [id number]   Fertility conservation was also discussed an option of sperm banking at [facility] was discussed with the patient and his healthcare power of attorney [name].  There would be a one-time cost involved for sperm banking and a yearly cost for preserving the specimen.  Overall chances of infertility with FOLFOX is low but possible.  Patient and his healthcare power of attorney have discussed their options and have decided not to proceed with fertility conservation at this time   Patient completed 12 cycles of adjuvant FOLFOX chemotherapy in followed by adjuvant radiation therapy in September 2023    CT scan and PET scan showed concern for metastatic disease.  18 x 16 mm left lower lobe lung nodule with an SUV of 2.4.  Progressive abdominal/retroperitoneal adenopathy with lymph nodes measuring between 6 mm to 18 mm which were PET avid.  Lung biopsy was negative for malignancy.  Retroperitoneal adenopathy was biopsied and was consistent with mucinous adenocarcinoma of colon primary.  Tissue Tempus testing has been sent out and is currently pending  Interval history-patient is doing well presently.  Denies any complaints.  Denies any abdominal pain and appetite and weight have remained stable.  Bowel habits are regular  ECOG PS- 0 Pain scale- 0   Review of systems- Review of Systems  Constitutional:  Negative for chills, fever, malaise/fatigue and weight loss.  HENT:  Negative for congestion, ear discharge and nosebleeds.   Eyes:  Negative for blurred vision.  Respiratory:  Negative for cough, hemoptysis, sputum production, shortness of breath and wheezing.   Cardiovascular:  Negative for chest pain, palpitations, orthopnea and claudication.  Gastrointestinal:  Negative for abdominal  pain, blood in stool, constipation, diarrhea, heartburn, melena, nausea and vomiting.  Genitourinary:  Negative for dysuria, flank pain, frequency, hematuria and urgency.  Musculoskeletal:  Negative for back pain, joint pain and myalgias.  Skin:  Negative for rash.  Neurological:  Negative for dizziness, tingling, focal weakness, seizures, weakness and headaches.  Endo/Heme/Allergies:  Does not bruise/bleed easily.  Psychiatric/Behavioral:  Negative for depression and suicidal ideas. The patient does not have insomnia.       Allergies  Allergen Reactions   Geodon [Ziprasidone Hcl] Anaphylaxis     Past Medical History:  Diagnosis Date   Bipolar 1 disorder (HCC)    Colon cancer (HCC)    Lung nodule 09/2022   PTSD (post-traumatic stress disorder)      Past Surgical History:  Procedure Laterality Date   BRONCHIAL NEEDLE ASPIRATION BIOPSY  10/12/2022   Procedure: BRONCHIAL NEEDLE ASPIRATION BIOPSIES;  Surgeon: Raechel Chute, MD;  Location: [facility] ENDOSCOPY;  Service: Pulmonary;;   COLON RESECTION N/A 07/12/2021   Procedure: HAND ASSISTED LAPAROSCOPIC COLON RESECTION;  Surgeon: Leafy Ro, MD;  Location: [facility] ORS;  Service: General;  Laterality: N/A;   COLONOSCOPY WITH PROPOFOL N/A 07/06/2021   Procedure: COLONOSCOPY WITH PROPOFOL;  Surgeon: Toney Reil, MD;  Location: [facility] ENDOSCOPY;  Service: Gastroenterology;  Laterality: N/A;   COLONOSCOPY WITH PROPOFOL N/A 07/07/2021   Procedure: COLONOSCOPY WITH PROPOFOL;  Surgeon: Toney Reil, MD;  Location: [facility] ENDOSCOPY;  Service: Gastroenterology;  Laterality: N/A;   COLONOSCOPY WITH PROPOFOL N/A 08/02/2022   Procedure: COLONOSCOPY WITH PROPOFOL;  Surgeon: Toney Reil, MD;  Location: [facility] ENDOSCOPY;  Service: Gastroenterology;  Laterality: N/AEarvin Hansen (Caregiver & Transporter) [phone] [name] [phone] will be available for phone consent   ESOPHAGOGASTRODUODENOSCOPY (EGD) WITH PROPOFOL N/A 07/06/2021    Procedure: ESOPHAGOGASTRODUODENOSCOPY (EGD) WITH PROPOFOL;  Surgeon: Toney Reil, MD;  Location: [facility] ENDOSCOPY;  Service: Gastroenterology;  Laterality: N/A;   GIVENS CAPSULE STUDY N/A 07/06/2021   Procedure: GIVENS CAPSULE STUDY;  Surgeon: Toney Reil, MD;  Location: [facility] ENDOSCOPY;  Service: Gastroenterology;  Laterality: N/A;   IR REMOVAL TUN ACCESS W/ PORT W/O FL MOD SED  02/15/2022   PORTACATH PLACEMENT Right 07/22/2021   Procedure: INSERTION PORT-A-CATH;  Surgeon: Leafy Ro, MD;  Location: [facility] ORS;  Service: General;  Laterality: Right;    Social History   Socioeconomic History   Marital status: Single    Spouse name: Not on file   Number of children: Not on file   Years of education: Not on file   Highest education level: Not on file  Occupational History   Not on file  Tobacco Use   Smoking status: Every Day    Current packs/day: 0.25    Types: Cigarettes   Smokeless tobacco: Current   Tobacco comments:    4 cigarettes a day - khj 09/28/2022  Vaping Use   Vaping status: Every Day   Devices: elfbar  Substance and Sexual Activity   Alcohol use: Not Currently   Drug use: Not Currently   Sexual activity: Not Currently  Other Topics Concern   Not on file  Social History Narrative   Not on file   Social Determinants of Health   Financial Resource Strain: Not on file  Food Insecurity: Not on file  Transportation Needs: Not on file  Physical Activity: Not on file  Stress: Not on file  Social  Connections: Not on file  Intimate Partner Violence: Not on file    Family History  Problem Relation Age of Onset   Kidney cancer Father    Breast cancer Maternal Aunt      Current Outpatient Medications:    acetaminophen (TYLENOL) 325 MG tablet, Take 2 tablets (650 mg total) by mouth every 6 (six) hours as needed for mild pain or moderate pain., Disp: 30 tablet, Rfl: 0   ARIPiprazole (ABILIFY) 10 MG tablet, Take 10 mg by mouth in the morning., Disp: ,  Rfl:    ARIPiprazole ER (ABILIFY MAINTENA) 400 MG PRSY prefilled syringe, Inject 400 mg into the muscle every 30 (thirty) days., Disp: , Rfl:    benztropine (COGENTIN) 2 MG tablet, Take 2 mg by mouth at bedtime., Disp: , Rfl:    busPIRone (BUSPAR) 10 MG tablet, Take 10 mg by mouth 2 (two) times daily., Disp: , Rfl:    FLUoxetine (PROZAC) 20 MG capsule, Take 20 mg by mouth daily., Disp: , Rfl:    ibuprofen (ADVIL) 600 MG tablet, Take 600 mg by mouth every 6 (six) hours as needed for moderate pain., Disp: , Rfl:    Melatonin 10 MG CAPS, Take 10 mg by mouth at bedtime., Disp: , Rfl:    ondansetron (ZOFRAN) 8 MG tablet, Take 8 mg by mouth 2 (two) times daily as needed for nausea or vomiting., Disp: , Rfl:    polyethylene glycol (MIRALAX / GLYCOLAX) 17 g packet, Take 17 g by mouth daily as needed (constipation)., Disp: 14 each, Rfl: 0   prazosin (MINIPRESS) 2 MG capsule, Take 2 mg by mouth 2 (two) times daily., Disp: , Rfl:    prochlorperazine (COMPAZINE) 10 MG tablet, Take 10 mg by mouth every 6 (six) hours as needed for nausea or vomiting., Disp: , Rfl:    lidocaine-prilocaine (EMLA) cream, Apply 1 Application topically See admin instructions. Apply to affected area once as directed (Patient not taking: Reported on 10/26/2022), Disp: , Rfl:    oxyCODONE (OXY IR/ROXICODONE) 5 MG immediate release tablet, Take 5 mg by mouth every 6 (six) hours as needed for severe pain or breakthrough pain. (Patient not taking: Reported on 10/26/2022), Disp: , Rfl:   Physical exam:  Vitals:   01/09/23 1415  BP: 114/77  Pulse: 90  Resp: 18  Temp: (!) 95.4 F (35.2 C)  TempSrc: Tympanic  SpO2: 100%  Weight: 199 lb 8 oz (90.5 kg)  Height: 6' (1.829 m)   Physical Exam Cardiovascular:     Rate and Rhythm: Normal rate and regular rhythm.     Heart sounds: Normal heart sounds.  Pulmonary:     Effort: Pulmonary effort is normal.     Breath sounds: Normal breath sounds.  Skin:    General: Skin is warm and dry.   Neurological:     Mental Status: He is alert and oriented to person, place, and time.         Latest Ref Rng & Units 01/09/2023    1:46 PM  CMP  Glucose 70 - 99 mg/dL 97   BUN 6 - 20 mg/dL 16   Creatinine 4.09 - 1.24 mg/dL 8.11   Sodium 914 - 782 mmol/L 138   Potassium 3.5 - 5.1 mmol/L 4.1   Chloride 98 - 111 mmol/L 107   CO2 22 - 32 mmol/L 25   Calcium 8.9 - 10.3 mg/dL 9.0   Total Protein 6.5 - 8.1 g/dL 6.9   Total Bilirubin 0.3 - 1.2 mg/dL  0.7   Alkaline Phos 38 - 126 U/L 60   AST 15 - 41 U/L 23   ALT 0 - 44 U/L 24       Latest Ref Rng & Units 01/09/2023    1:46 PM  CBC  WBC 4.0 - 10.5 K/uL 4.6   Hemoglobin 13.0 - 17.0 g/dL 40.9   Hematocrit 81.1 - 52.0 % 42.7   Platelets 150 - 400 K/uL 222     No images are attached to the encounter.  CT ABDOMINAL MASS BIOPSY  Result Date: 01/01/2023 INDICATION: Retroperitoneal lymphadenopathy EXAM: CT-guided core needle biopsy of retroperitoneal lymph node MEDICATIONS: None. ANESTHESIA/SEDATION: Moderate (conscious) sedation was employed during this procedure. A total of Versed 1 mg and Fentanyl 50 mcg was administered intravenously. Moderate Sedation Time: 14 minutes. The patient's level of consciousness and vital signs were monitored continuously by radiology nursing throughout the procedure under my direct supervision. FLUOROSCOPY TIME:  N/a COMPLICATIONS: None immediate. PROCEDURE: Informed written consent was obtained from the patient after a thorough discussion of the procedural risks, benefits and alternatives. All questions were addressed. Maximal Sterile Barrier Technique was utilized including caps, mask, sterile gowns, sterile gloves, sterile drape, hand hygiene and skin antiseptic. A timeout was performed prior to the initiation of the procedure. The patient was placed prone on the exam table. Limited CT of the abdomen and pelvis was performed for planning purposes. This demonstrated retroperitoneal lymphadenopathy. Skin entry  site was marked, and the overlying skin was prepped and draped in the standard sterile fashion. Local analgesia was obtained with 1% lidocaine. Using intermittent CT fluoroscopy, a 17 gauge introducer needle was advanced towards the identified lesion. Subsequently, core needle biopsy was performed using an 18 gauge core biopsy device x6 total passes. Specimens were submitted in formalin to pathology for further handling. Limited postprocedure imaging demonstrated no complicating feature. The patient tolerated the procedure well, and was transferred to recovery in stable condition. IMPRESSION: Successful CT-guided core needle biopsy of enlarged retroperitoneal lymph node. Electronically Signed   By: Olive Bass M.D.   On: 01/01/2023 12:21     Assessment and plan- Patient is a 37 y.o. male we have stage III colon cancer now presenting with metastatic disease  Patient had history of stage IIIc colon adenocarcinoma s/p surgery and completed 6 months of adjuvant chemotherapy as well as radiation treatment ending in September 2023.  His most recent CT as well as PET CT scan showed new mildly hypermetabolic lung nodule as well as mildly progressive retroperitoneal adenopathy.  All the lung biopsy was negative for malignancy retroperitoneal adenopathy was biopsied and is consistent with colon cancer.  This unfortunately constitutes stage IV disease.  I would recommend second line FOLFIRI Avastin chemotherapy for him at this time every 2 weeks until progression or toxicity.  Discussed risks and benefits of chemotherapy including all but not limited to nausea, vomiting, low blood counts, risk of infections and hospitalization.  Risk of diarrhea associated with irinotecan.  Risks and benefits of a Avastin including all but not limited to hypertension, risk of thromboembolic events as well as proteinuria.  Treatment will be given with a palliative intent.  I have discussed all this with patient's legal guardian [name]  over the phone as well.  She would like to speak to the patient as well as other members of his care team and get back to [place] with a decision early next week.  I am awaiting the results of tissue Tempus testing to see if  there are any actionable mutations.  He was BRAF negative and comprehensive Rast panel testing is pending.  However given that he had a right sided tumor I would still proceed with a Avastin over panitumumab at this time and it can be considered down the line for second line treatment option.  Based on decision from the patient and his legal guardian we will tentatively plan to start treatment in about 2 weeks time   Visit Diagnosis 1. Adenocarcinoma of colon [facility])      Dr. Owens Shark, MD, MPH [facility] [phone] 01/10/2023 8:49 AM

## 2023-01-11 ENCOUNTER — Telehealth: Payer: Self-pay | Admitting: *Deleted

## 2023-01-11 NOTE — Telephone Encounter (Signed)
I got on the phone and spoke to him and he told me I needed to talk to Burna Mortimer about all the instructions for the port insertion.  Told her that he needs to arrive at the heart and vascular area on 10 2 at 10:30 AM nothing to eat or drink for 8 hours and it has conscious sedation so you know you have to have another person that could be sitting back with him after he gets port in.  And also need a person to drive them that assisted living.  I also called to say that Dr. Smith Robert is waiting for Demetria Pore to approve it on Monday and so if that goes ok, then we would proceed.  Not we will be canceling the 10/ 2 port insertion

## 2023-01-14 ENCOUNTER — Encounter: Payer: Self-pay | Admitting: Oncology

## 2023-01-14 ENCOUNTER — Encounter: Payer: Self-pay | Admitting: Radiology

## 2023-01-15 ENCOUNTER — Other Ambulatory Visit: Payer: Self-pay | Admitting: Student

## 2023-01-15 ENCOUNTER — Telehealth: Payer: Self-pay | Admitting: *Deleted

## 2023-01-15 DIAGNOSIS — Z01812 Encounter for preprocedural laboratory examination: Secondary | ICD-10-CM

## 2023-01-15 NOTE — Progress Notes (Signed)
Telephone encounter: 01/15/2023 1300  I spoke with patient who verbalized understanding of NPO after midnight and to [facility] the [facility] at 10:30 am on 01/16/2023. Pt denies being on blood thinners or diabetes medication.

## 2023-01-15 NOTE — Telephone Encounter (Addendum)
Called Paulita Cradle from DSS and he is approved to get port insertion and he can have folfiri and irinotecan, Leucovorin. I am emailed [name] so that she can sign for the treatment plan. I then called Burna Mortimer at the assisted living and went all over about the time and go back to heart and vascular. Nothing to eat or drink 8 hours before the port insertion. Needs 1 person ride with him and then 1 person to drive on the way back. I also called [place] and let him know that he is not to eat or drink after midnight tonight. So when he wakes up please no eating or drinking for 10/1. He understands also.

## 2023-01-16 ENCOUNTER — Other Ambulatory Visit: Payer: Self-pay | Admitting: Oncology

## 2023-01-16 ENCOUNTER — Encounter: Payer: Self-pay | Admitting: Oncology

## 2023-01-16 ENCOUNTER — Telehealth: Payer: Self-pay | Admitting: *Deleted

## 2023-01-16 ENCOUNTER — Ambulatory Visit
Admission: RE | Admit: 2023-01-16 | Discharge: 2023-01-16 | Disposition: A | Discharge status: Home / Self Care | Payer: MEDICAID | Source: Ambulatory Visit | Attending: Oncology | Admitting: Radiology

## 2023-01-16 DIAGNOSIS — C189 Malignant neoplasm of colon, unspecified: Secondary | ICD-10-CM

## 2023-01-16 MED ORDER — DEXAMETHASONE 4 MG PO TABS
8.0000 mg | ORAL_TABLET | Freq: Every day | ORAL | 5 refills | Status: DC
Start: 2023-01-21 — End: 2023-04-30

## 2023-01-16 MED ORDER — LIDOCAINE-PRILOCAINE 2.5-2.5 % EX CREA
TOPICAL_CREAM | CUTANEOUS | 3 refills | Status: DC
Start: 2023-01-21 — End: 2023-01-24

## 2023-01-16 MED ORDER — ONDANSETRON HCL 8 MG PO TABS: 8.0000 mg | ORAL_TABLET | Freq: Three times a day (TID) | ORAL | 1 refills | Status: DC | PRN

## 2023-01-16 MED ORDER — LOPERAMIDE HCL 2 MG PO CAPS: ORAL_CAPSULE | ORAL | 0 refills | Status: DC

## 2023-01-16 MED ORDER — PROCHLORPERAZINE MALEATE 10 MG PO TABS: 10.0000 mg | ORAL_TABLET | Freq: Four times a day (QID) | ORAL | 1 refills | Status: DC | PRN

## 2023-01-16 NOTE — Progress Notes (Signed)
DISCONTINUE ON PATHWAY REGIMEN - Colorectal     A cycle is every 14 days:     Oxaliplatin      Leucovorin      Fluorouracil      Fluorouracil   **Always confirm dose/schedule in your pharmacy ordering system**  REASON: Disease Progression PRIOR TREATMENT: COS67: [id number] q14 Days x 6 Months TREATMENT RESPONSE: Progressive Disease (PD)  START ON PATHWAY REGIMEN - Colorectal     A cycle is every 14 days:     Bevacizumab-xxxx      Irinotecan      Leucovorin      Fluorouracil      Fluorouracil   **Always confirm dose/schedule in your pharmacy ordering system**  Patient Characteristics: Distant Metastases, Nonsurgical Candidate, Non-KRAS G12C, RAS Mutation Positive/Unknown (BRAF V600 Wild-Type/Unknown), Standard Cytotoxic Therapy, First Line Standard Cytotoxic Therapy, Bevacizumab Eligible, PS = 0,1 Tumor Location: Colon Therapeutic Status: Distant Metastases Microsatellite/Mismatch Repair Status: MSS/pMMR BRAF Mutation Status: Wild-Type (no mutation) KRAS/NRAS Mutation Status: Awaiting Test Results Preferred Therapy Approach: Standard Cytotoxic Therapy Standard Cytotoxic Line of Therapy: First Line Standard Cytotoxic Therapy ECOG Performance Status: 0 Bevacizumab Eligibility: Eligible Intent of Therapy: Non-Curative / Palliative Intent, Discussed with Patient

## 2023-01-16 NOTE — Progress Notes (Signed)
Pharmacist Chemotherapy Monitoring - Initial Assessment    Anticipated start date: 01/24/23   The following has been reviewed per standard work regarding the patient's treatment regimen: The patient's diagnosis, treatment plan and drug doses, and organ/hematologic function Lab orders and baseline tests specific to treatment regimen  The treatment plan start date, drug sequencing, and pre-medications Prior authorization status  Patient's documented medication list, including drug-drug interaction screen and prescriptions for anti-emetics and supportive care specific to the treatment regimen The drug concentrations, fluid compatibility, administration routes, and timing of the medications to be used The patient's access for treatment and lifetime cumulative dose history, if applicable  The patient's medication allergies and previous infusion related reactions, if applicable   Changes made to treatment plan:  N/A  Follow up needed:  N/A   Ebony Hail, Pharm.D., CPP 01/16/2023@2 :57 PM

## 2023-01-16 NOTE — Telephone Encounter (Signed)
Called the assisted living and wanted to see what happened that pt did not come today for his port insertion. Tim said that he had the date wrong. I had called him yest. And spoke to [place] yest. To make sure we were clear. I called back today so that I can give them the new date for port. It is October 4 which is Friday. I spoke to [place] and Jorja Loa that Friday  10/4, arrive at :10:30. Nothing to eat or drink for 8 hours prior to the appt. He must have a rider and a driver to take him back after the procedure to go back to assisted living. Both Tim and [place] knows all the info and I hope that patient will get here

## 2023-01-17 ENCOUNTER — Other Ambulatory Visit: Payer: Self-pay | Admitting: Radiology

## 2023-01-17 ENCOUNTER — Other Ambulatory Visit: Payer: Self-pay

## 2023-01-18 ENCOUNTER — Telehealth: Payer: Self-pay | Admitting: *Deleted

## 2023-01-18 ENCOUNTER — Encounter: Payer: Self-pay | Admitting: Oncology

## 2023-01-18 ENCOUNTER — Other Ambulatory Visit: Payer: Self-pay | Admitting: Student

## 2023-01-18 NOTE — Progress Notes (Signed)
Patient for IR Port Placement on Friday 01/19/2023, [name] from [employer] called and spoke with the facility that the patient lives at and gave pre-procedure instructions. [name] said that she made them aware to have the patient here at 10:30a, NPO after MN prior to procedure as well as driver post procedure/recovery/discharge.  Called 01/16/2023 and 01/18/2023

## 2023-01-18 NOTE — Telephone Encounter (Signed)
I called at 4:24 pm to remind about the port insertion and no one answered and I left message about arrive 10:30 am and nothing to eat 8 hours prior to the procedure. He will have conscious sedation so he needs a driver and another person to take him back and make sure he is ok. The area is heart and vascular.   I just called again and Burna Mortimer answered and we went over the above info for the port insertion. She says they have all the instructions and I told her to tell [place] not to eat in am. She will tell him

## 2023-01-18 NOTE — H&P (Addendum)
Chief Complaint: Patient was seen in consultation today for colon cancer  Referring Physician(s): Rao,Archana C  Supervising Physician: Roanna Banning  Patient Status: [facility] - Out-pt  History of Present Illness: Connor Wilkinson is a 37 y.o. male with a past medical history significant for PTSD, Bipolar 1 disorder, stage IV colon cancer with recurrent metastatic disease who presents today for port placement. Connor Wilkinson was diagnosed with colon cancer in 2023 and underwent right hemicolectomy 07/12/21. He completed 12 cycles of chemotherapy followed by radiation in September of 2023. He underwent CT chest/abd/pelvis w/contrast 09/19/22 which showed:  1. New 15 mm left lower lobe pulmonary nodule and 5 mm subpleural left lower lobe pulmonary nodule, concerning for metastatic disease. 2. New 7 mm left adrenal nodule, indeterminate but suspicious for metastatic disease. Suggest further evaluation with nuclear medicine PET-CT. 3. Prior right hemicolectomy with ileocolic anastomosis. No suspicious nodularity along the suture line.  PET scan 12/02/22 showed:  Status post right hemicolectomy.   Mildly progressive abdominal/retroperitoneal nodal metastases, as above.   18 mm left lower lobe nodule, minimally progressive, suspicious for pulmonary metastasis.   He then underwent left retroperitoneal lymph node biopsy 01/01/23 in IR and pathology showed metastatic mucinous adenocarcinoma.  He is planned to undergo further systemic therapy and he presents to IR today for port placement for durable venous access. Per chart review patient with previous right internal jugular port placed by general surgery 07/22/21 and removed in IR 02/15/22 without complication.  Past Medical History:  Diagnosis Date   Bipolar 1 disorder (HCC)    Colon cancer (HCC)    Lung nodule 09/2022   PTSD (post-traumatic stress disorder)     Past Surgical History:  Procedure Laterality Date   BRONCHIAL NEEDLE  ASPIRATION BIOPSY  10/12/2022   Procedure: BRONCHIAL NEEDLE ASPIRATION BIOPSIES;  Surgeon: Raechel Chute, MD;  Location: [facility] ENDOSCOPY;  Service: Pulmonary;;   COLON RESECTION N/A 07/12/2021   Procedure: HAND ASSISTED LAPAROSCOPIC COLON RESECTION;  Surgeon: Leafy Ro, MD;  Location: [facility] ORS;  Service: General;  Laterality: N/A;   COLONOSCOPY WITH PROPOFOL N/A 07/06/2021   Procedure: COLONOSCOPY WITH PROPOFOL;  Surgeon: Toney Reil, MD;  Location: [facility] ENDOSCOPY;  Service: Gastroenterology;  Laterality: N/A;   COLONOSCOPY WITH PROPOFOL N/A 07/07/2021   Procedure: COLONOSCOPY WITH PROPOFOL;  Surgeon: Toney Reil, MD;  Location: [facility] ENDOSCOPY;  Service: Gastroenterology;  Laterality: N/A;   COLONOSCOPY WITH PROPOFOL N/A 08/02/2022   Procedure: COLONOSCOPY WITH PROPOFOL;  Surgeon: Toney Reil, MD;  Location: [facility] ENDOSCOPY;  Service: Gastroenterology;  Laterality: N/AEarvin Hansen (Caregiver & Transporter) [phone] [name] [phone] will be available for phone consent   ESOPHAGOGASTRODUODENOSCOPY (EGD) WITH PROPOFOL N/A 07/06/2021   Procedure: ESOPHAGOGASTRODUODENOSCOPY (EGD) WITH PROPOFOL;  Surgeon: Toney Reil, MD;  Location: [facility] ENDOSCOPY;  Service: Gastroenterology;  Laterality: N/A;   GIVENS CAPSULE STUDY N/A 07/06/2021   Procedure: GIVENS CAPSULE STUDY;  Surgeon: Toney Reil, MD;  Location: [facility] ENDOSCOPY;  Service: Gastroenterology;  Laterality: N/A;   IR REMOVAL TUN ACCESS W/ PORT W/O FL MOD SED  02/15/2022   PORTACATH PLACEMENT Right 07/22/2021   Procedure: INSERTION PORT-A-CATH;  Surgeon: Leafy Ro, MD;  Location: [facility] ORS;  Service: General;  Laterality: Right;    Allergies: Geodon [ziprasidone hcl]  Medications: Prior to Admission medications   Medication Sig Start Date End Date Taking? Authorizing Provider  acetaminophen (TYLENOL) 325 MG tablet Take 2 tablets (650 mg total) by mouth every 6 (six) hours as  needed for mild  pain or moderate pain. 01/01/23   Kennieth Francois, PA  ARIPiprazole (ABILIFY) 10 MG tablet Take 10 mg by mouth in the morning.    [provider]  ARIPiprazole ER (ABILIFY MAINTENA) 400 MG PRSY prefilled syringe Inject 400 mg into the muscle every 30 (thirty) days.    [provider]  benztropine (COGENTIN) 2 MG tablet Take 2 mg by mouth at bedtime.    [provider]  busPIRone (BUSPAR) 10 MG tablet Take 10 mg by mouth 2 (two) times daily.    [provider]  dexamethasone (DECADRON) 4 MG tablet Take 2 tablets (8 mg total) by mouth daily. Start the day after chemotherapy for 2 days. Take with food. 01/21/23   Creig Hines, MD  FLUoxetine (PROZAC) 20 MG capsule Take 20 mg by mouth daily.    [provider]  ibuprofen (ADVIL) 600 MG tablet Take 600 mg by mouth every 6 (six) hours as needed for moderate pain.    [provider]  lidocaine-prilocaine (EMLA) cream Apply 1 Application topically See admin instructions. Apply to affected area once as directed Patient not taking: Reported on 10/26/2022    [provider]  lidocaine-prilocaine (EMLA) cream Apply to affected area once 01/21/23   Creig Hines, MD  loperamide (IMODIUM) 2 MG capsule Take 2 tabs by mouth with first loose stool, then 1 tab with each additional loose stool as needed. Do not exceed 8 tabs in a 24-hour period 01/21/23   Creig Hines, MD  Melatonin 10 MG CAPS Take 10 mg by mouth at bedtime.    [provider]  ondansetron (ZOFRAN) 8 MG tablet Take 8 mg by mouth 2 (two) times daily as needed for nausea or vomiting.    [provider]  ondansetron (ZOFRAN) 8 MG tablet Take 1 tablet (8 mg total) by mouth every 8 (eight) hours as needed for nausea, vomiting or refractory nausea / vomiting. Start on the third day after chemotherapy. 01/21/23   Creig Hines, MD  oxyCODONE (OXY IR/ROXICODONE) 5 MG immediate release tablet Take 5 mg by mouth every 6 (six) hours  as needed for severe pain or breakthrough pain. Patient not taking: Reported on 10/26/2022    [provider]  polyethylene glycol (MIRALAX / GLYCOLAX) 17 g packet Take 17 g by mouth daily as needed (constipation). 07/15/21   Wouk, Wilfred Curtis, MD  prazosin (MINIPRESS) 2 MG capsule Take 2 mg by mouth 2 (two) times daily.    Antonieta Iba, MD  prochlorperazine (COMPAZINE) 10 MG tablet Take 10 mg by mouth every 6 (six) hours as needed for nausea or vomiting.    [provider]  prochlorperazine (COMPAZINE) 10 MG tablet Take 1 tablet (10 mg total) by mouth every 6 (six) hours as needed for nausea or vomiting. 01/21/23   Creig Hines, MD     Family History  Problem Relation Age of Onset   Kidney cancer Father    Breast cancer Maternal Aunt     Social History   Socioeconomic History   Marital status: Single    Spouse name: Not on file   Number of children: Not on file   Years of education: Not on file   Highest education level: Not on file  Occupational History   Not on file  Tobacco Use   Smoking status: Every Day    Current packs/day: 0.25    Types: Cigarettes   Smokeless tobacco: Current  Tobacco comments:    4 cigarettes a day - khj 09/28/2022  Vaping Use   Vaping status: Every Day   Devices: elfbar  Substance and Sexual Activity   Alcohol use: Not Currently   Drug use: Not Currently   Sexual activity: Not Currently  Other Topics Concern   Not on file  Social History Narrative   Not on file   Social Determinants of Health   Financial Resource Strain: Not on file  Food Insecurity: Not on file  Transportation Needs: Not on file  Physical Activity: Not on file  Stress: Not on file  Social Connections: Not on file     Review of Systems: A 12 point ROS discussed and pertinent positives are indicated in the HPI above.  All other systems are negative.  Review of Systems  Constitutional:  Negative for chills and fever.  Respiratory:  Negative for  cough and shortness of breath.   Cardiovascular:  Negative for chest pain.  Gastrointestinal:  Negative for abdominal pain, diarrhea, nausea and vomiting.  Musculoskeletal:  Negative for back pain.  Neurological:  Negative for dizziness and headaches.    Vital Signs: BP 113/77   Pulse 62   Temp 98.4 F (36.9 C) (Oral)   Resp 20   Ht 6' (1.829 m)   Wt 200 lb (90.7 kg)   SpO2 98%   BMI 27.12 kg/m   Physical Exam Vitals reviewed.  Constitutional:      General: He is not in acute distress. HENT:     Head: Normocephalic.     Mouth/Throat:     Mouth: Mucous membranes are moist.     Pharynx: Oropharynx is clear. No oropharyngeal exudate or posterior oropharyngeal erythema.  Cardiovascular:     Rate and Rhythm: Normal rate and regular rhythm.  Pulmonary:     Effort: Pulmonary effort is normal.     Breath sounds: Normal breath sounds.  Abdominal:     General: There is no distension.     Palpations: Abdomen is soft.     Tenderness: There is no abdominal tenderness.  Skin:    General: Skin is warm and dry.  Neurological:     Mental Status: He is alert and oriented to person, place, and time.  Psychiatric:        Mood and Affect: Mood normal.        Behavior: Behavior normal.        Thought Content: Thought content normal.        Judgment: Judgment normal.      MD Evaluation Airway: WNL Heart: WNL Abdomen: WNL Chest/ Lungs: WNL ASA  Classification: 3 Mallampati/Airway Score: One   Imaging: CT ABDOMINAL MASS BIOPSY  Result Date: 01/01/2023 INDICATION: Retroperitoneal lymphadenopathy EXAM: CT-guided core needle biopsy of retroperitoneal lymph node MEDICATIONS: None. ANESTHESIA/SEDATION: Moderate (conscious) sedation was employed during this procedure. A total of Versed 1 mg and Fentanyl 50 mcg was administered intravenously. Moderate Sedation Time: 14 minutes. The patient's level of consciousness and vital signs were monitored continuously by radiology nursing  throughout the procedure under my direct supervision. FLUOROSCOPY TIME:  N/a COMPLICATIONS: None immediate. PROCEDURE: Informed written consent was obtained from the patient after a thorough discussion of the procedural risks, benefits and alternatives. All questions were addressed. Maximal Sterile Barrier Technique was utilized including caps, mask, sterile gowns, sterile gloves, sterile drape, hand hygiene and skin antiseptic. A timeout was performed prior to the initiation of the procedure. The patient was placed prone on the exam table.  Limited CT of the abdomen and pelvis was performed for planning purposes. This demonstrated retroperitoneal lymphadenopathy. Skin entry site was marked, and the overlying skin was prepped and draped in the standard sterile fashion. Local analgesia was obtained with 1% lidocaine. Using intermittent CT fluoroscopy, a 17 gauge introducer needle was advanced towards the identified lesion. Subsequently, core needle biopsy was performed using an 18 gauge core biopsy device x6 total passes. Specimens were submitted in formalin to pathology for further handling. Limited postprocedure imaging demonstrated no complicating feature. The patient tolerated the procedure well, and was transferred to recovery in stable condition. IMPRESSION: Successful CT-guided core needle biopsy of enlarged retroperitoneal lymph node. Electronically Signed   By: Olive Bass M.D.   On: 01/01/2023 12:21    Labs:  CBC: Recent Labs    05/24/22 1347 09/25/22 1334 01/01/23 0945 01/09/23 1346  WBC 4.2 5.9 5.7 4.6  HGB 13.9 14.9 14.7 14.4  HCT 42.4 44.4 43.1 42.7  PLT 220 199 213 222    COAGS: Recent Labs    01/01/23 0945  INR 1.0    BMP: Recent Labs    02/21/22 1417 05/24/22 1347 09/25/22 1334 01/09/23 1346  NA 139 138 136 138  K 4.3 4.0 4.0 4.1  CL 103 106 107 107  CO2 28 25 25 25   GLUCOSE 97 98 108* 97  BUN 13 12 14 16   CALCIUM 9.5 8.9 8.4* 9.0  CREATININE 0.84 0.92 0.90  1.12  GFRNONAA >60 >60 >60 >60    LIVER FUNCTION TESTS: Recent Labs    02/21/22 1417 05/24/22 1347 09/25/22 1334 01/09/23 1346  BILITOT 0.4 0.3 0.4 0.7  AST 23 21 31 23   ALT 17 17 32 24  ALKPHOS 65 64 66 60  PROT 7.2 6.8 7.0 6.9  ALBUMIN 4.3 4.3 4.3 4.1    TUMOR MARKERS: No results for input(s): "AFPTM", "CEA", "CA199", "CHROMGRNA" in the last 8760 hours.  Assessment and Plan:  37 y/o M with history of stage IV colon cancer who presents today for port placement prior to undergoing additional systemic therapy. Patient with previous right internal jugular port placed by general surgery and removed in IR 02/15/22.  Risks and benefits of image-guided Port-a-catheter placement were discussed with the patient including, but not limited to bleeding, infection, pneumothorax, or fibrin sheath development and need for additional procedures.  All of the patient's questions were answered, patient is agreeable to proceed. Discussed procedure indications, risks, benefits and alternatives with patient's legal guardian [name] via phone today who is also in agreement to proceed.  Consent signed and in chart.  Thank you for this interesting consult.  I greatly enjoyed meeting Connor Wilkinson and look forward to participating in their care.  A copy of this report was sent to the requesting provider on this date.  Electronically Signed: Villa Herb, PA-C 01/18/2023, 10:59 AM   I spent a total of 25 Minutes in face to face in clinical consultation, greater than 50% of which was counseling/coordinating care for colon cancer.

## 2023-01-19 ENCOUNTER — Ambulatory Visit
Admission: RE | Admit: 2023-01-19 | Discharge: 2023-01-19 | Disposition: A | Discharge status: Home / Self Care | Payer: MEDICAID | Source: Ambulatory Visit | Attending: Oncology | Admitting: Radiology

## 2023-01-19 DIAGNOSIS — F4310 Post-traumatic stress disorder, unspecified: Secondary | ICD-10-CM | POA: Insufficient documentation

## 2023-01-19 DIAGNOSIS — Z01812 Encounter for preprocedural laboratory examination: Secondary | ICD-10-CM

## 2023-01-19 DIAGNOSIS — C189 Malignant neoplasm of colon, unspecified: Secondary | ICD-10-CM | POA: Insufficient documentation

## 2023-01-19 DIAGNOSIS — F17210 Nicotine dependence, cigarettes, uncomplicated: Secondary | ICD-10-CM | POA: Diagnosis not present

## 2023-01-19 DIAGNOSIS — F319 Bipolar disorder, unspecified: Secondary | ICD-10-CM | POA: Insufficient documentation

## 2023-01-19 HISTORY — PX: IR IMAGING GUIDED PORT INSERTION: IMG5740

## 2023-01-19 MED ORDER — MIDAZOLAM HCL 2 MG/2ML IJ SOLN
INTRAMUSCULAR | Status: AC
  Filled 2023-01-19: qty 2

## 2023-01-19 MED ORDER — FENTANYL CITRATE (PF) 100 MCG/2ML IJ SOLN
INTRAMUSCULAR | Status: AC | PRN
  Administered 2023-01-19 (×2): 50 ug via INTRAVENOUS

## 2023-01-19 MED ORDER — HEPARIN SOD (PORK) LOCK FLUSH 100 UNIT/ML IV SOLN
500.0000 [IU] | Freq: Once | INTRAVENOUS | Status: AC
  Administered 2023-01-19: 500 [IU] via INTRAVENOUS

## 2023-01-19 MED ORDER — SODIUM CHLORIDE 0.9 % IV SOLN: INTRAVENOUS | Status: DC

## 2023-01-19 MED ORDER — LIDOCAINE-EPINEPHRINE 1 %-1:100000 IJ SOLN
20.0000 mL | Freq: Once | INTRAMUSCULAR | Status: AC
  Administered 2023-01-19: 20 mL via INTRADERMAL

## 2023-01-19 MED ORDER — FENTANYL CITRATE (PF) 100 MCG/2ML IJ SOLN
INTRAMUSCULAR | Status: AC
  Filled 2023-01-19: qty 2

## 2023-01-19 MED ORDER — LIDOCAINE-EPINEPHRINE 1 %-1:100000 IJ SOLN
INTRAMUSCULAR | Status: AC
  Filled 2023-01-19: qty 1

## 2023-01-19 MED ORDER — MIDAZOLAM HCL 2 MG/2ML IJ SOLN
INTRAMUSCULAR | Status: AC | PRN
Start: 2023-01-19 — End: 2023-01-19
  Administered 2023-01-19: 1 mg via INTRAVENOUS

## 2023-01-19 MED ORDER — HEPARIN SOD (PORK) LOCK FLUSH 100 UNIT/ML IV SOLN
INTRAVENOUS | Status: AC
  Filled 2023-01-19: qty 5

## 2023-01-19 NOTE — Procedures (Signed)
Vascular and Interventional Radiology Procedure Note  Patient: Connor Wilkinson DOB: 01/04/1986 Medical Record Number: [id number] Note Date/Time: 01/19/23 11:21 AM   Performing Physician: Roanna Banning, MD Assistant(s): None  Diagnosis: Colon cancer  Procedure: PORT PLACEMENT  Anesthesia: Conscious Sedation Complications: None Estimated Blood Loss: Minimal  Findings:  Successful right-sided port placement, with the tip of the catheter in the proximal right atrium.  Plan: Catheter ready for use.  See detailed procedure note with images in PACS. The patient tolerated the procedure well without incident or complication and was returned to Recovery in stable condition.    Roanna Banning, MD [facility] Rad[facility]   Pager. [phone] Clinic. [phone]

## 2023-01-21 ENCOUNTER — Other Ambulatory Visit: Payer: Self-pay

## 2023-01-23 MED FILL — Dexamethasone Sodium Phosphate Inj 100 MG/10ML: INTRAMUSCULAR | Qty: 1 | Status: AC

## 2023-01-24 ENCOUNTER — Inpatient Hospital Stay: Payer: MEDICAID

## 2023-01-24 ENCOUNTER — Other Ambulatory Visit: Payer: Self-pay | Admitting: *Deleted

## 2023-01-24 ENCOUNTER — Inpatient Hospital Stay (HOSPITAL_BASED_OUTPATIENT_CLINIC_OR_DEPARTMENT_OTHER): Payer: MEDICAID | Admitting: Oncology

## 2023-01-24 ENCOUNTER — Encounter: Payer: Self-pay | Admitting: Oncology

## 2023-01-24 ENCOUNTER — Inpatient Hospital Stay: Payer: MEDICAID | Attending: Oncology

## 2023-01-24 VITALS — BP 115/85 | HR 106 | Temp 96.4°F | Resp 18 | Ht 72.0 in | Wt 199.2 lb

## 2023-01-24 DIAGNOSIS — F17290 Nicotine dependence, other tobacco product, uncomplicated: Secondary | ICD-10-CM | POA: Insufficient documentation

## 2023-01-24 DIAGNOSIS — Z803 Family history of malignant neoplasm of breast: Secondary | ICD-10-CM | POA: Insufficient documentation

## 2023-01-24 DIAGNOSIS — C786 Secondary malignant neoplasm of retroperitoneum and peritoneum: Secondary | ICD-10-CM | POA: Insufficient documentation

## 2023-01-24 DIAGNOSIS — C189 Malignant neoplasm of colon, unspecified: Secondary | ICD-10-CM | POA: Diagnosis not present

## 2023-01-24 DIAGNOSIS — Z5111 Encounter for antineoplastic chemotherapy: Secondary | ICD-10-CM | POA: Diagnosis present

## 2023-01-24 DIAGNOSIS — C7801 Secondary malignant neoplasm of right lung: Secondary | ICD-10-CM | POA: Insufficient documentation

## 2023-01-24 DIAGNOSIS — Z8051 Family history of malignant neoplasm of kidney: Secondary | ICD-10-CM | POA: Insufficient documentation

## 2023-01-24 DIAGNOSIS — F17210 Nicotine dependence, cigarettes, uncomplicated: Secondary | ICD-10-CM | POA: Diagnosis not present

## 2023-01-24 DIAGNOSIS — C7802 Secondary malignant neoplasm of left lung: Secondary | ICD-10-CM | POA: Insufficient documentation

## 2023-01-24 LAB — CBC WITH DIFFERENTIAL (CANCER CENTER ONLY)
Abs Immature Granulocytes: 0.03 10*3/uL (ref 0.00–0.07)
Basophils Absolute: 0 10*3/uL (ref 0.0–0.1)
Basophils Relative: 1 %
Eosinophils Absolute: 0.1 10*3/uL (ref 0.0–0.5)
Eosinophils Relative: 1 %
HCT: 45.4 % (ref 39.0–52.0)
Hemoglobin: 15.6 g/dL (ref 13.0–17.0)
Immature Granulocytes: 1 %
Lymphocytes Relative: 16 %
Lymphs Abs: 0.9 10*3/uL (ref 0.7–4.0)
MCH: 31.9 pg (ref 26.0–34.0)
MCHC: 34.4 g/dL (ref 30.0–36.0)
MCV: 92.8 fL (ref 80.0–100.0)
Monocytes Absolute: 0.3 10*3/uL (ref 0.1–1.0)
Monocytes Relative: 6 %
Neutro Abs: 4.5 10*3/uL (ref 1.7–7.7)
Neutrophils Relative %: 75 %
Platelet Count: 203 10*3/uL (ref 150–400)
RBC: 4.89 MIL/uL (ref 4.22–5.81)
RDW: 11.9 % (ref 11.5–15.5)
WBC Count: 5.9 10*3/uL (ref 4.0–10.5)
nRBC: 0 % (ref 0.0–0.2)

## 2023-01-24 LAB — CMP (CANCER CENTER ONLY)
ALT: 48 U/L — ABNORMAL HIGH (ref 0–44)
AST: 43 U/L — ABNORMAL HIGH (ref 15–41)
Albumin: 4.7 g/dL (ref 3.5–5.0)
Alkaline Phosphatase: 70 U/L (ref 38–126)
Anion gap: 6 (ref 5–15)
BUN: 12 mg/dL (ref 6–20)
CO2: 26 mmol/L (ref 22–32)
Calcium: 9.2 mg/dL (ref 8.9–10.3)
Chloride: 104 mmol/L (ref 98–111)
Creatinine: 1.01 mg/dL (ref 0.61–1.24)
GFR, Estimated: >60 mL/min (ref >60)
Glucose, Bld: 105 mg/dL — ABNORMAL HIGH (ref 70–99)
Potassium: 4.2 mmol/L (ref 3.5–5.1)
Sodium: 136 mmol/L (ref 135–145)
Total Bilirubin: 0.6 mg/dL (ref 0.3–1.2)
Total Protein: 7.5 g/dL (ref 6.5–8.1)

## 2023-01-24 LAB — PROTEIN, URINE, RANDOM: Total Protein, Urine: 12 mg/dL

## 2023-01-24 MED ORDER — SODIUM CHLORIDE 0.9 % IV SOLN: 5.0000 mg/kg | Freq: Once | INTRAVENOUS | Status: DC

## 2023-01-24 MED ORDER — SODIUM CHLORIDE 0.9 % IV SOLN
10.0000 mg | Freq: Once | INTRAVENOUS | Status: AC
  Administered 2023-01-24: 10 mg via INTRAVENOUS
  Filled 2023-01-24: qty 10

## 2023-01-24 MED ORDER — SODIUM CHLORIDE 0.9 % IV SOLN
Freq: Once | INTRAVENOUS | Status: AC
  Filled 2023-01-24: qty 250

## 2023-01-24 MED ORDER — SODIUM CHLORIDE 0.9 % IV SOLN
150.0000 mg/m2 | Freq: Once | INTRAVENOUS | Status: AC
  Administered 2023-01-24: 320 mg via INTRAVENOUS
  Filled 2023-01-24: qty 16

## 2023-01-24 MED ORDER — FLUOROURACIL CHEMO INJECTION 2.5 GM/50ML
400.0000 mg/m2 | Freq: Once | INTRAVENOUS | Status: AC
  Administered 2023-01-24: 850 mg via INTRAVENOUS
  Filled 2023-01-24: qty 17

## 2023-01-24 MED ORDER — SODIUM CHLORIDE 0.9 % IV SOLN
400.0000 mg/m2 | Freq: Once | INTRAVENOUS | Status: AC
  Administered 2023-01-24: 856 mg via INTRAVENOUS
  Filled 2023-01-24: qty 42.8

## 2023-01-24 MED ORDER — ATROPINE SULFATE 1 MG/ML IV SOLN
0.5000 mg | Freq: Once | INTRAVENOUS | Status: AC | PRN
  Administered 2023-01-24: 0.5 mg via INTRAVENOUS

## 2023-01-24 MED ORDER — LIDOCAINE-PRILOCAINE 2.5-2.5 % EX CREA: 1.0000 | TOPICAL_CREAM | CUTANEOUS | 2 refills | Status: DC

## 2023-01-24 MED ORDER — SODIUM CHLORIDE 0.9 % IV SOLN
2400.0000 mg/m2 | INTRAVENOUS | Status: DC
  Administered 2023-01-24: 5000 mg via INTRAVENOUS
  Filled 2023-01-24: qty 100

## 2023-01-24 MED ORDER — PALONOSETRON HCL INJECTION 0.25 MG/5ML
0.2500 mg | Freq: Once | INTRAVENOUS | Status: AC
  Administered 2023-01-24: 0.25 mg via INTRAVENOUS
  Filled 2023-01-24: qty 5

## 2023-01-24 NOTE — Patient Instructions (Signed)
[facility]  Discharge Instructions: Thank you for choosing [facility] to provide your oncology and hematology care.  If you have a lab appointment with the Cancer Center, please go directly to the Cancer Center and check in at the registration area.  Wear comfortable clothing and clothing appropriate for easy access to any Portacath or PICC line.   We strive to give you quality time with your provider. You may need to reschedule your appointment if you arrive late (15 or more minutes).  Arriving late affects you and other patients whose appointments are after yours.  Also, if you miss three or more appointments without notifying the office, you may be dismissed from the clinic at the provider's discretion.      For prescription refill requests, have your pharmacy contact our office and allow 72 hours for refills to be completed.    Today you received the following chemotherapy and/or immunotherapy agents IRINOTECAN, LEUCOVORIN, and 5 Fu      To help prevent nausea and vomiting after your treatment, we encourage you to take your nausea medication as directed.  BELOW ARE SYMPTOMS THAT SHOULD BE REPORTED IMMEDIATELY: *FEVER GREATER THAN 100.4 F (38 C) OR HIGHER *CHILLS OR SWEATING *NAUSEA AND VOMITING THAT IS NOT CONTROLLED WITH YOUR NAUSEA MEDICATION *UNUSUAL SHORTNESS OF BREATH *UNUSUAL BRUISING OR BLEEDING *URINARY PROBLEMS (pain or burning when urinating, or frequent urination) *BOWEL PROBLEMS (unusual diarrhea, constipation, pain near the anus) TENDERNESS IN MOUTH AND THROAT WITH OR WITHOUT PRESENCE OF ULCERS (sore throat, sores in mouth, or a toothache) UNUSUAL RASH, SWELLING OR PAIN  UNUSUAL VAGINAL DISCHARGE OR ITCHING   Items with * indicate a potential emergency and should be followed up as soon as possible or go to the Emergency Department if any problems should occur.  Please show the CHEMOTHERAPY ALERT CARD or IMMUNOTHERAPY  ALERT CARD at check-in to the Emergency Department and triage nurse.  Should you have questions after your visit or need to cancel or reschedule your appointment, please contact [facility]  [phone] and follow the prompts.  Office hours are 8:00 a.m. to 4:30 p.m. Monday - Friday. Please note that voicemails left after 4:00 p.m. may not be returned until the following business day.  We are closed weekends and major holidays. You have access to a nurse at all times for urgent questions. Please call the main number to the clinic [phone] and follow the prompts.  For any non-urgent questions, you may also contact your provider using MyChart. We now offer e-Visits for anyone 29 and older to request care online for non-urgent symptoms. For details visit [email or web address].   Also download the MyChart app! Go to the app store, search "MyChart", open the app, select [facility], and log in with your MyChart username and password.  Irinotecan Injection What is this medication? IRINOTECAN (ir in oh TEE kan) treats some types of cancer. It works by slowing down the growth of cancer cells. This medicine may be used for other purposes; ask your health care provider or pharmacist if you have questions. COMMON BRAND NAME(S): Camptosar What should I tell my care team before I take this medication? They need to know if you have any of these conditions: Dehydration Diarrhea Infection, especially a viral infection, such as chickenpox, cold sores, herpes Liver disease Low blood cell levels (white cells, red cells, and platelets) Low levels of electrolytes, such as calcium, magnesium, or potassium in your  blood Recent or ongoing radiation An unusual or allergic reaction to irinotecan, other medications, foods, dyes, or preservatives If you or your partner are pregnant or trying to get pregnant Breast-feeding How should I use this medication? This medication is  injected into a vein. It is given by your care team in a hospital or clinic setting. Talk to your care team about the use of this medication in children. Special care may be needed. Overdosage: If you think you have taken too much of this medicine contact a poison control center or emergency room at once. NOTE: This medicine is only for you. Do not share this medicine with others. What if I miss a dose? Keep appointments for follow-up doses. It is important not to miss your dose. Call your care team if you are unable to keep an appointment. What may interact with this medication? Do not take this medication with any of the following: Cobicistat Itraconazole This medication may also interact with the following: Certain antibiotics, such as clarithromycin, rifampin, rifabutin Certain antivirals for HIV or AIDS Certain medications for fungal infections, such as ketoconazole, posaconazole, voriconazole Certain medications for seizures, such as carbamazepine, phenobarbital, phenytoin Gemfibrozil Nefazodone St. John's wort This list may not describe all possible interactions. Give your health care provider a list of all the medicines, herbs, non-prescription drugs, or dietary supplements you use. Also tell them if you smoke, drink alcohol, or use illegal drugs. Some items may interact with your medicine. What should I watch for while using this medication? Your condition will be monitored carefully while you are receiving this medication. You may need blood work while taking this medication. This medication may make you feel generally unwell. This is not uncommon as chemotherapy can affect healthy cells as well as cancer cells. Report any side effects. Continue your course of treatment even though you feel ill unless your care team tells you to stop. This medication can cause serious side effects. To reduce the risk, your care team may give you other medications to take before receiving this one. Be  sure to follow the directions from your care team. This medication may affect your coordination, reaction time, or judgement. Do not drive or operate machinery until you know how this medication affects you. Sit up or stand slowly to reduce the risk of dizzy or fainting spells. Drinking alcohol with this medication can increase the risk of these side effects. This medication may increase your risk of getting an infection. Call your care team for advice if you get a fever, chills, sore throat, or other symptoms of a cold or flu. Do not treat yourself. Try to avoid being around people who are sick. Avoid taking medications that contain aspirin, acetaminophen, ibuprofen, naproxen, or ketoprofen unless instructed by your care team. These medications may hide a fever. This medication may increase your risk to bruise or bleed. Call your care team if you notice any unusual bleeding. Be careful brushing or flossing your teeth or using a toothpick because you may get an infection or bleed more easily. If you have any dental work done, tell your dentist you are receiving this medication. Talk to your care team if you or your partner are pregnant or think either of you might be pregnant. This medication can cause serious birth defects if taken during pregnancy and for 6 months after the last dose. You will need a negative pregnancy test before starting this medication. Contraception is recommended while taking this medication and for 6 months after  the last dose. Your care team can help you find the option that works for you. Do not father a child while taking this medication and for 3 months after the last dose. Use a condom for contraception during this time period. Do not breastfeed while taking this medication and for 7 days after the last dose. This medication may cause infertility. Talk to your care team if you are concerned about your fertility. What side effects may I notice from receiving this  medication? Side effects that you should report to your care team as soon as possible: Allergic reactions--skin rash, itching, hives, swelling of the face, lips, tongue, or throat Dry cough, shortness of breath or trouble breathing Increased saliva or tears, increased sweating, stomach cramping, diarrhea, small pupils, unusual weakness or fatigue, slow heartbeat Infection--fever, chills, cough, sore throat, wounds that don't heal, pain or trouble when passing urine, general feeling of discomfort or being unwell Kidney injury--decrease in the amount of urine, swelling of the ankles, hands, or feet Low red blood cell level--unusual weakness or fatigue, dizziness, headache, trouble breathing Severe or prolonged diarrhea Unusual bruising or bleeding Side effects that usually do not require medical attention (report to your care team if they continue or are bothersome): Constipation Diarrhea Hair loss Loss of appetite Nausea Stomach pain This list may not describe all possible side effects. Call your doctor for medical advice about side effects. You may report side effects to FDA at 1-800-FDA-1088. Where should I keep my medication? This medication is given in a hospital or clinic. It will not be stored at home. NOTE: This sheet is a summary. It may not cover all possible information. If you have questions about this medicine, talk to your doctor, pharmacist, or health care provider.  2024 Elsevier/Gold Standard (2021-08-15 00:00:00)  Leucovorin Injection What is this medication? LEUCOVORIN (loo koe VOR in) prevents side effects from certain medications, such as methotrexate. It works by increasing folate levels. This helps protect healthy cells in your body. It may also be used to treat anemia caused by low levels of folate. It can also be used with fluorouracil, a type of chemotherapy, to treat colorectal cancer. It works by increasing the effects of fluorouracil in the body. This medicine  may be used for other purposes; ask your health care provider or pharmacist if you have questions. What should I tell my care team before I take this medication? They need to know if you have any of these conditions: Anemia from low levels of vitamin B12 in the blood An unusual or allergic reaction to leucovorin, folic acid, other medications, foods, dyes, or preservatives Pregnant or trying to get pregnant Breastfeeding How should I use this medication? This medication is injected into a vein or a muscle. It is given by your care team in a hospital or clinic setting. Talk to your care team about the use of this medication in children. Special care may be needed. Overdosage: If you think you have taken too much of this medicine contact a poison control center or emergency room at once. NOTE: This medicine is only for you. Do not share this medicine with others. What if I miss a dose? Keep appointments for follow-up doses. It is important not to miss your dose. Call your care team if you are unable to keep an appointment. What may interact with this medication? Capecitabine Fluorouracil Phenobarbital Phenytoin Primidone Trimethoprim;sulfamethoxazole This list may not describe all possible interactions. Give your health care provider a list of all  the medicines, herbs, non-prescription drugs, or dietary supplements you use. Also tell them if you smoke, drink alcohol, or use illegal drugs. Some items may interact with your medicine. What should I watch for while using this medication? Your condition will be monitored carefully while you are receiving this medication. This medication may increase the side effects of 5-fluorouracil. Tell your care team if you have diarrhea or mouth sores that do not get better or that get worse. What side effects may I notice from receiving this medication? Side effects that you should report to your care team as soon as possible: Allergic reactions--skin rash,  itching, hives, swelling of the face, lips, tongue, or throat This list may not describe all possible side effects. Call your doctor for medical advice about side effects. You may report side effects to FDA at 1-800-FDA-1088. Where should I keep my medication? This medication is given in a hospital or clinic. It will not be stored at home. NOTE: This sheet is a summary. It may not cover all possible information. If you have questions about this medicine, talk to your doctor, pharmacist, or health care provider.  2024 Elsevier/Gold Standard (2021-09-06 00:00:00)  Fluorouracil Injection What is this medication? FLUOROURACIL (flure oh YOOR a sil) treats some types of cancer. It works by slowing down the growth of cancer cells. This medicine may be used for other purposes; ask your health care provider or pharmacist if you have questions. COMMON BRAND NAME(S): Adrucil What should I tell my care team before I take this medication? They need to know if you have any of these conditions: Blood disorders Dihydropyrimidine dehydrogenase (DPD) deficiency Infection, such as chickenpox, cold sores, herpes Kidney disease Liver disease Poor nutrition Recent or ongoing radiation therapy An unusual or allergic reaction to fluorouracil, other medications, foods, dyes, or preservatives If you or your partner are pregnant or trying to get pregnant Breast-feeding How should I use this medication? This medication is injected into a vein. It is administered by your care team in a hospital or clinic setting. Talk to your care team about the use of this medication in children. Special care may be needed. Overdosage: If you think you have taken too much of this medicine contact a poison control center or emergency room at once. NOTE: This medicine is only for you. Do not share this medicine with others. What if I miss a dose? Keep appointments for follow-up doses. It is important not to miss your dose. Call  your care team if you are unable to keep an appointment. What may interact with this medication? Do not take this medication with any of the following: Live virus vaccines This medication may also interact with the following: Medications that treat or prevent blood clots, such as warfarin, enoxaparin, dalteparin This list may not describe all possible interactions. Give your health care provider a list of all the medicines, herbs, non-prescription drugs, or dietary supplements you use. Also tell them if you smoke, drink alcohol, or use illegal drugs. Some items may interact with your medicine. What should I watch for while using this medication? Your condition will be monitored carefully while you are receiving this medication. This medication may make you feel generally unwell. This is not uncommon as chemotherapy can affect healthy cells as well as cancer cells. Report any side effects. Continue your course of treatment even though you feel ill unless your care team tells you to stop. In some cases, you may be given additional medications to help with  side effects. Follow all directions for their use. This medication may increase your risk of getting an infection. Call your care team for advice if you get a fever, chills, sore throat, or other symptoms of a cold or flu. Do not treat yourself. Try to avoid being around people who are sick. This medication may increase your risk to bruise or bleed. Call your care team if you notice any unusual bleeding. Be careful brushing or flossing your teeth or using a toothpick because you may get an infection or bleed more easily. If you have any dental work done, tell your dentist you are receiving this medication. Avoid taking medications that contain aspirin, acetaminophen, ibuprofen, naproxen, or ketoprofen unless instructed by your care team. These medications may hide a fever. Do not treat diarrhea with over the counter products. Contact your care team if  you have diarrhea that lasts more than 2 days or if it is severe and watery. This medication can make you more sensitive to the sun. Keep out of the sun. If you cannot avoid being in the sun, wear protective clothing and sunscreen. Do not use sun lamps, tanning beds, or tanning booths. Talk to your care team if you or your partner wish to become pregnant or think you might be pregnant. This medication can cause serious birth defects if taken during pregnancy and for 3 months after the last dose. A reliable form of contraception is recommended while taking this medication and for 3 months after the last dose. Talk to your care team about effective forms of contraception. Do not father a child while taking this medication and for 3 months after the last dose. Use a condom while having sex during this time period. Do not breastfeed while taking this medication. This medication may cause infertility. Talk to your care team if you are concerned about your fertility. What side effects may I notice from receiving this medication? Side effects that you should report to your care team as soon as possible: Allergic reactions--skin rash, itching, hives, swelling of the face, lips, tongue, or throat Heart attack--pain or tightness in the chest, shoulders, arms, or jaw, nausea, shortness of breath, cold or clammy skin, feeling faint or lightheaded Heart failure--shortness of breath, swelling of the ankles, feet, or hands, sudden weight gain, unusual weakness or fatigue Heart rhythm changes--fast or irregular heartbeat, dizziness, feeling faint or lightheaded, chest pain, trouble breathing High ammonia level--unusual weakness or fatigue, confusion, loss of appetite, nausea, vomiting, seizures Infection--fever, chills, cough, sore throat, wounds that don't heal, pain or trouble when passing urine, general feeling of discomfort or being unwell Low red blood cell level--unusual weakness or fatigue, dizziness, headache,  trouble breathing Pain, tingling, or numbness in the hands or feet, muscle weakness, change in vision, confusion or trouble speaking, loss of balance or coordination, trouble walking, seizures Redness, swelling, and blistering of the skin over hands and feet Severe or prolonged diarrhea Unusual bruising or bleeding Side effects that usually do not require medical attention (report to your care team if they continue or are bothersome): Dry skin Headache Increased tears Nausea Pain, redness, or swelling with sores inside the mouth or throat Sensitivity to light Vomiting This list may not describe all possible side effects. Call your doctor for medical advice about side effects. You may report side effects to FDA at 1-800-FDA-1088. Where should I keep my medication? This medication is given in a hospital or clinic. It will not be stored at home. NOTE: This sheet is  a summary. It may not cover all possible information. If you have questions about this medicine, talk to your doctor, pharmacist, or health care provider.  2024 Elsevier/Gold Standard (2021-08-09 00:00:00)

## 2023-01-24 NOTE — Progress Notes (Signed)
Per Dr Smith Robert, no Bevacizumab today since port placed 01/19/23.  Ebony Hail, Pharm.D., CPP 01/24/2023@11 :42 AM

## 2023-01-25 ENCOUNTER — Encounter: Payer: Self-pay | Admitting: Oncology

## 2023-01-25 ENCOUNTER — Other Ambulatory Visit: Payer: Self-pay

## 2023-01-25 NOTE — Progress Notes (Signed)
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  He was found to have a hemoglobin of 4.5/18.7 on admission.  He received blood transfusion as well as IV iron and his hemoglobin is presently up to an 8.  He underwent colonoscopy which showed malignant partially obstructing tumor that was circumferential 80 cm proximal to the anus.  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  CT chest abdomen and pelvis with contrast did not show any evidence of distant metastatic disease.  2 small sclerotic densities in the acetabulum and proximal right femur likely benign process.  Pathologically enlarged lymph nodes superior to circumferential colonic lesion suggesting metastatic adenopathy.   Patient underwent right hemicolectomy with Dr. Everlene Farrier on 07/12/2021.  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  Vermiform appendix negative for tumor.  Radial margin positive for tumor  (2 lymph nodes at margin with adenocarcinoma).  Proximal and distal margins negative.  7 out of 56 lymph nodes positive for metastatic adenocarcinoma.  Tumor deposits not applicable.  No lymphovascular or perineural invasion seen.  [id number]   Fertility conservation was also discussed an option of sperm banking at [facility] was discussed with the patient and his healthcare power of attorney [name].  There would be a one-time cost involved for sperm banking and a yearly cost for preserving the specimen.  Overall chances of infertility with FOLFOX is low but possible.  Patient and his healthcare power of attorney have discussed their options and have decided not to proceed with fertility conservation at this time   Patient completed 12 cycles of adjuvant FOLFOX chemotherapy in followed by adjuvant radiation therapy in September 2023    CT scan and PET scan showed concern for metastatic disease.  18 x 16 mm left lower lobe lung nodule with an SUV of 2.4.  Progressive abdominal/retroperitoneal adenopathy with lymph nodes measuring between 6 mm to 18 mm which were PET avid.  Lung biopsy was negative for malignancy.  Retroperitoneal adenopathy was biopsied and was consistent with mucinous adenocarcinoma of colon primary.  Tissue [employer] testing has been sent out and is currently pending    Interval history- he is doing well. Denies any complaints today  ECOG PS- 0 Pain scale- 0   Review of systems- Review of Systems  Constitutional:  Negative for chills, fever, malaise/fatigue and weight loss.  HENT:  Negative for congestion, ear discharge and nosebleeds.   Eyes:  Negative for blurred vision.  Respiratory:  Negative for cough, hemoptysis, sputum production, shortness of breath and wheezing.   Cardiovascular:  Negative for chest pain, palpitations, orthopnea and  claudication.  Gastrointestinal:  Negative for abdominal pain, blood in stool, constipation, diarrhea, heartburn, melena, nausea and vomiting.   Genitourinary:  Negative for dysuria, flank pain, frequency, hematuria and urgency.  Musculoskeletal:  Negative for back pain, joint pain and myalgias.  Skin:  Negative for rash.  Neurological:  Negative for dizziness, tingling, focal weakness, seizures, weakness and headaches.  Endo/Heme/Allergies:  Does not bruise/bleed easily.  Psychiatric/Behavioral:  Negative for depression and suicidal ideas. The patient does not have insomnia.       Allergies  Allergen Reactions   Geodon [Ziprasidone Hcl] Anaphylaxis     Past Medical History:  Diagnosis Date   Bipolar 1 disorder (HCC)    Colon cancer (HCC)    Lung nodule 09/2022   PTSD (post-traumatic stress disorder)      Past Surgical History:  Procedure Laterality Date   BRONCHIAL NEEDLE ASPIRATION BIOPSY  10/12/2022   Procedure: BRONCHIAL NEEDLE ASPIRATION BIOPSIES;  Surgeon: Raechel Chute, MD;  Location: [facility] ENDOSCOPY;  Service: Pulmonary;;   COLON RESECTION N/A 07/12/2021   Procedure: HAND ASSISTED LAPAROSCOPIC COLON RESECTION;  Surgeon: Leafy Ro, MD;  Location: [facility] ORS;  Service: General;  Laterality: N/A;   COLONOSCOPY WITH PROPOFOL N/A 07/06/2021   Procedure: COLONOSCOPY WITH PROPOFOL;  Surgeon: Toney Reil, MD;  Location: [facility] ENDOSCOPY;  Service: Gastroenterology;  Laterality: N/A;   COLONOSCOPY WITH PROPOFOL N/A 07/07/2021   Procedure: COLONOSCOPY WITH PROPOFOL;  Surgeon: Toney Reil, MD;  Location[facility] ENDOSCOPY;  Service: Gastroenterology;  Laterality: N/A;   COLONOSCOPY WITH PROPOFOL N/A 08/02/2022   Procedure: COLONOSCOPY WITH PROPOFOL;  Surgeon: Toney Reil, MD;  Location: [facility] ENDOSCOPY;  Service: Gastroenterology;  Laterality: N/AEarvin Hansen (Caregiver & Transporter) [phone] [name] [phone] will be available for phone consent   ESOPHAGOGASTRODUODENOSCOPY (EGD) WITH PROPOFOL N/A 07/06/2021   Procedure: ESOPHAGOGASTRODUODENOSCOPY (EGD) WITH PROPOFOL;  Surgeon: Toney Reil, MD;  Location[facility] ENDOSCOPY;  Service: Gastroenterology;  Laterality: N/A;   GIVENS CAPSULE STUDY N/A 07/06/2021   Procedure: GIVENS CAPSULE STUDY;  Surgeon: Toney Reil, MD;  Location: [facility] ENDOSCOPY;  Service: Gastroenterology;  Laterality: N/A;   IR IMAGING GUIDED PORT INSERTION  01/19/2023   IR REMOVAL TUN ACCESS W/ PORT W/O FL MOD SED  02/15/2022   PORTACATH PLACEMENT Right 07/22/2021   Procedure: INSERTION PORT-A-CATH;  Surgeon: Leafy Ro, MD;  Location: [facility] ORS;  Service: General;  Laterality: Right;    Social History   Socioeconomic History   Marital status: Single    Spouse name: Not on file   Number of children: Not on file   Years of education: Not on file   Highest education level: Not on file  Occupational History   Not on file  Tobacco Use   Smoking status: Every Day    Current packs/day: 0.25    Types: Cigarettes   Smokeless tobacco: Current   Tobacco comments:    4 cigarettes a day - khj 09/28/2022  Vaping Use   Vaping status: Every Day   Devices: elfbar  Substance and Sexual Activity   Alcohol use: Not Currently   Drug use: Not Currently   Sexual activity: Not Currently  Other Topics Concern   Not on file  Social History Narrative   Not on file   Social Determinants of Health   Financial Resource Strain: Not on file  Food Insecurity: Not on file  Transportation Needs: Not on file  Physical Activity: Not on file  Stress: Not on file  Social Connections: Not on file  Intimate Partner Violence: Not on file    Family History  Problem Relation Age of Onset   Kidney cancer Father    Breast cancer Maternal Aunt      Current Outpatient Medications:    acetaminophen (TYLENOL) 325 MG tablet, Take 2 tablets (650 mg total) by mouth every 6 (six) hours as needed for mild pain or moderate pain., Disp: 30 tablet, Rfl: 0   ARIPiprazole (ABILIFY) 10 MG tablet, Take 10 mg by mouth in the morning., Disp: , Rfl:    ARIPiprazole ER (ABILIFY  MAINTENA) 400 MG PRSY prefilled syringe, Inject 400 mg into the muscle every 30 (thirty) days., Disp: , Rfl:    benztropine (COGENTIN) 2 MG tablet, Take 2 mg by mouth at bedtime., Disp: , Rfl:    busPIRone (BUSPAR) 10 MG tablet, Take 10 mg by mouth 2 (two) times daily., Disp: , Rfl:    dexamethasone (DECADRON) 4 MG tablet, Take 2 tablets (8 mg total) by mouth daily. Start the day after chemotherapy for 2 days. Take with food., Disp: 8 tablet, Rfl: 5   FLUoxetine (PROZAC) 20 MG capsule, Take 20 mg by mouth daily., Disp: , Rfl:    ibuprofen (ADVIL) 600 MG tablet, Take 600 mg by mouth every 6 (six) hours as needed for moderate pain., Disp: , Rfl:    lidocaine-prilocaine (EMLA) cream, Apply 1 Application topically See admin instructions. Apply to affected area once as directed 1 hour before the pt. Comes to get chemo, Disp: 30 g, Rfl: 2   loperamide (IMODIUM) 2 MG capsule, Take 2 tabs by mouth with first loose stool, then 1 tab with each additional loose stool as needed. Do not exceed 8 tabs in a 24-hour period, Disp: 60 capsule, Rfl: 0   Melatonin 10 MG CAPS, Take 10 mg by mouth at bedtime., Disp: , Rfl:    ondansetron (ZOFRAN) 8 MG tablet, Take 8 mg by mouth 2 (two) times daily as needed for nausea or vomiting., Disp: , Rfl:    ondansetron (ZOFRAN) 8 MG tablet, Take 1 tablet (8 mg total) by mouth every 8 (eight) hours as needed for nausea, vomiting or refractory nausea / vomiting. Start on the third day after chemotherapy., Disp: 30 tablet, Rfl: 1   oxyCODONE (OXY IR/ROXICODONE) 5 MG immediate release tablet, Take 5 mg by mouth every 6 (six) hours as needed for severe pain or breakthrough pain., Disp: , Rfl:    polyethylene glycol (MIRALAX / GLYCOLAX) 17 g packet, Take 17 g by mouth daily as needed (constipation)., Disp: 14 each, Rfl: 0   prazosin (MINIPRESS) 2 MG capsule, Take 2 mg by mouth 2 (two) times daily., Disp: , Rfl:    prochlorperazine (COMPAZINE) 10 MG tablet, Take 10 mg by mouth every 6 (six)  hours as needed for nausea or vomiting., Disp: , Rfl:    prochlorperazine (COMPAZINE) 10 MG tablet, Take 1 tablet (10 mg total) by mouth every 6 (six) hours as needed for nausea or vomiting., Disp: 30 tablet, Rfl: 1  Physical exam:  Vitals:   01/24/23 1110  BP: 115/85  Pulse: (!) 106  Resp: 18  Temp: (!) 96.4 F (35.8 C)  TempSrc: Tympanic  SpO2: 98%  Weight: 199 lb 3.2 oz (90.4 kg)  Height: 6' (1.829 m)   Physical Exam Cardiovascular:     Rate and Rhythm: Normal rate and regular rhythm.     Heart sounds: Normal heart sounds.  Pulmonary:     Effort:  Pulmonary effort is normal.     Breath sounds: Normal breath sounds.  Skin:    General: Skin is warm and dry.  Neurological:     Mental Status: He is alert and oriented to person, place, and time.         Latest Ref Rng & Units 01/24/2023   10:24 AM  CMP  Glucose 70 - 99 mg/dL 469   BUN 6 - 20 mg/dL 12   Creatinine 6.29 - 1.24 mg/dL 5.28   Sodium 413 - 244 mmol/L 136   Potassium 3.5 - 5.1 mmol/L 4.2   Chloride 98 - 111 mmol/L 104   CO2 22 - 32 mmol/L 26   Calcium 8.9 - 10.3 mg/dL 9.2   Total Protein 6.5 - 8.1 g/dL 7.5   Total Bilirubin 0.3 - 1.2 mg/dL 0.6   Alkaline Phos 38 - 126 U/L 70   AST 15 - 41 U/L 43   ALT 0 - 44 U/L 48       Latest Ref Rng & Units 01/24/2023   10:24 AM  CBC  WBC 4.0 - 10.5 K/uL 5.9   Hemoglobin 13.0 - 17.0 g/dL 01.0   Hematocrit 27.2 - 52.0 % 45.4   Platelets 150 - 400 K/uL 203     No images are attached to the encounter.  IR IMAGING GUIDED PORT INSERTION  Result Date: 01/19/2023 INDICATION: colon cancer EXAM: IMPLANTED PORT A CATH PLACEMENT WITH ULTRASOUND AND FLUOROSCOPIC GUIDANCE MEDICATIONS: None ANESTHESIA/SEDATION: Moderate (conscious) sedation was employed during this procedure. A total of Versed 1 mg and Fentanyl 100 mcg was administered intravenously. Moderate Sedation Time: 24 minutes. The patient's level of consciousness and vital signs were monitored continuously by radiology  nursing throughout the procedure under my direct supervision. FLUOROSCOPY TIME:  Fluoroscopic dose; 1 mGy COMPLICATIONS: None immediate. PROCEDURE: The procedure, risks, benefits, and alternatives were explained to the patient. Questions regarding the procedure were encouraged and answered. The patient understands and consents to the procedure. The RIGHT neck and chest were prepped with chlorhexidine in a sterile fashion, and a sterile drape was applied covering the operative field. Maximum barrier sterile technique with sterile gowns and gloves were used for the procedure. A timeout was performed prior to the initiation of the procedure. Local anesthesia was provided with 1% lidocaine with epinephrine. After creating a small venotomy incision, a micropuncture kit was utilized to access the internal jugular vein under direct, real-time ultrasound guidance. Ultrasound image documentation was performed. The microwire was kinked to measure appropriate catheter length. A subcutaneous port pocket was then created along the upper chest wall utilizing a combination of sharp and blunt dissection. The pocket was irrigated with sterile saline. A single lumen Non-ISP power injectable port was chosen for placement. The 8 Fr catheter was tunneled from the port pocket site to the venotomy incision. The port was placed in the pocket. The external catheter was trimmed to appropriate length. At the venotomy, an 8 Fr peel-away sheath was placed over a guidewire under fluoroscopic guidance. The catheter was then placed through the sheath and the sheath was removed. Final catheter positioning was confirmed and documented with a fluoroscopic spot radiograph. The port was accessed with a Huber needle, aspirated and flushed with heparinized saline. The port pocket incision was closed with interrupted 3-0 Vicryl suture then Dermabond was applied, including at the venotomy incision. Dressings were placed. The patient tolerated the procedure  well without immediate post procedural complication. IMPRESSION: Successful placement of a RIGHT internal jugular  approach power injectable Port-A-Cath. The tip of the catheter is positioned within the proximal RIGHT atrium. The catheter is ready for immediate use. Roanna Banning, MD [facility] Radiology Electronically Signed   By: Roanna Banning M.D.   On: 01/19/2023 12:33   CT ABDOMINAL MASS BIOPSY  Result Date: 01/01/2023 INDICATION: Retroperitoneal lymphadenopathy EXAM: CT-guided core needle biopsy of retroperitoneal lymph node MEDICATIONS: None. ANESTHESIA/SEDATION: Moderate (conscious) sedation was employed during this procedure. A total of Versed 1 mg and Fentanyl 50 mcg was administered intravenously. Moderate Sedation Time: 14 minutes. The patient's level of consciousness and vital signs were monitored continuously by radiology nursing throughout the procedure under my direct supervision. FLUOROSCOPY TIME:  N/a COMPLICATIONS: None immediate. PROCEDURE: Informed written consent was obtained from the patient after a thorough discussion of the procedural risks, benefits and alternatives. All questions were addressed. Maximal Sterile Barrier Technique was utilized including caps, mask, sterile gowns, sterile gloves, sterile drape, hand hygiene and skin antiseptic. A timeout was performed prior to the initiation of the procedure. The patient was placed prone on the exam table. Limited CT of the abdomen and pelvis was performed for planning purposes. This demonstrated retroperitoneal lymphadenopathy. Skin entry site was marked, and the overlying skin was prepped and draped in the standard sterile fashion. Local analgesia was obtained with 1% lidocaine. Using intermittent CT fluoroscopy, a 17 gauge introducer needle was advanced towards the identified lesion. Subsequently, core needle biopsy was performed using an 18 gauge core biopsy device x6 total passes.  Specimens were submitted in formalin to pathology for further handling. Limited postprocedure imaging demonstrated no complicating feature. The patient tolerated the procedure well, and was transferred to recovery in stable condition. IMPRESSION: Successful CT-guided core needle biopsy of enlarged retroperitoneal lymph node. Electronically Signed   By: Olive Bass M.D.   On: 01/01/2023 12:21     Assessment and plan- Patient is a 37 y.o. male with h/o stage III colon cancer now with metastatic disease in the lungs and retroperitoneal denopathy here for on treatment prior to cycle 1 of FOLFIRI chemotherapy  Counts okay to proceed with cycle 1 of palliative FOLFIRI chemotherapy today.  He will be receiving irinotecan at a reduced dose of 150 mg/m to reduce toxicity.  I am holding off on giving him a Avastin today since he just had a port placed 5 days ago but I will plan to add Avastin with cycle 2.  Consent obtained from legal guardian as well.  I will see him back in 2 weeks for cycle 2.  Plan to repeat scans after 6 cycles of treatment.  Will also continue to monitor CEA.  Repeat tissue testing has been ordered for NGS on his biopsy sample   Visit Diagnosis 1. Adenocarcinoma of colon (HCC)   2. Encounter for antineoplastic chemotherapy      Dr. Owens Shark, MD, MPH [facility] [phone] 01/25/2023 12:30 PM

## 2023-01-26 ENCOUNTER — Inpatient Hospital Stay: Payer: MEDICAID

## 2023-01-26 VITALS — BP 126/86 | HR 92 | Resp 18

## 2023-01-26 DIAGNOSIS — Z5111 Encounter for antineoplastic chemotherapy: Secondary | ICD-10-CM | POA: Diagnosis not present

## 2023-01-26 DIAGNOSIS — C189 Malignant neoplasm of colon, unspecified: Secondary | ICD-10-CM

## 2023-01-26 MED ORDER — SODIUM CHLORIDE 0.9% FLUSH
10.0000 mL | INTRAVENOUS | Status: DC | PRN
  Administered 2023-01-26: 10 mL
  Filled 2023-01-26: qty 10

## 2023-01-26 MED ORDER — HEPARIN SOD (PORK) LOCK FLUSH 100 UNIT/ML IV SOLN
500.0000 [IU] | Freq: Once | INTRAVENOUS | Status: AC | PRN
  Administered 2023-01-26: 500 [IU]
  Filled 2023-01-26: qty 5

## 2023-02-01 ENCOUNTER — Encounter: Payer: Self-pay | Admitting: Oncology

## 2023-02-07 ENCOUNTER — Inpatient Hospital Stay (HOSPITAL_BASED_OUTPATIENT_CLINIC_OR_DEPARTMENT_OTHER): Payer: MEDICAID | Admitting: Oncology

## 2023-02-07 ENCOUNTER — Encounter: Payer: Self-pay | Admitting: Oncology

## 2023-02-07 ENCOUNTER — Inpatient Hospital Stay: Payer: MEDICAID

## 2023-02-07 VITALS — HR 86 | Temp 97.5°F | Wt 198.0 lb

## 2023-02-07 DIAGNOSIS — C189 Malignant neoplasm of colon, unspecified: Secondary | ICD-10-CM

## 2023-02-07 DIAGNOSIS — Z5111 Encounter for antineoplastic chemotherapy: Secondary | ICD-10-CM | POA: Diagnosis not present

## 2023-02-07 LAB — CBC WITH DIFFERENTIAL (CANCER CENTER ONLY)
Abs Immature Granulocytes: 0.01 10*3/uL (ref 0.00–0.07)
Basophils Absolute: 0 10*3/uL (ref 0.0–0.1)
Basophils Relative: 0 %
Eosinophils Absolute: 0 10*3/uL (ref 0.0–0.5)
Eosinophils Relative: 1 %
HCT: 42.1 % (ref 39.0–52.0)
Hemoglobin: 14.1 g/dL (ref 13.0–17.0)
Immature Granulocytes: 0 %
Lymphocytes Relative: 25 %
Lymphs Abs: 1 10*3/uL (ref 0.7–4.0)
MCH: 31.2 pg (ref 26.0–34.0)
MCHC: 33.5 g/dL (ref 30.0–36.0)
MCV: 93.1 fL (ref 80.0–100.0)
Monocytes Absolute: 0.3 10*3/uL (ref 0.1–1.0)
Monocytes Relative: 8 %
Neutro Abs: 2.5 10*3/uL (ref 1.7–7.7)
Neutrophils Relative %: 66 %
Platelet Count: 180 10*3/uL (ref 150–400)
RBC: 4.52 MIL/uL (ref 4.22–5.81)
RDW: 12.2 % (ref 11.5–15.5)
WBC Count: 3.8 10*3/uL — ABNORMAL LOW (ref 4.0–10.5)
nRBC: 0 % (ref 0.0–0.2)

## 2023-02-07 LAB — CMP (CANCER CENTER ONLY)
ALT: 21 U/L (ref 0–44)
AST: 23 U/L (ref 15–41)
Albumin: 4.3 g/dL (ref 3.5–5.0)
Alkaline Phosphatase: 54 U/L (ref 38–126)
Anion gap: 4 — ABNORMAL LOW (ref 5–15)
BUN: 16 mg/dL (ref 6–20)
CO2: 25 mmol/L (ref 22–32)
Calcium: 8.8 mg/dL — ABNORMAL LOW (ref 8.9–10.3)
Chloride: 106 mmol/L (ref 98–111)
Creatinine: 0.88 mg/dL (ref 0.61–1.24)
GFR, Estimated: >60 mL/min (ref >60)
Glucose, Bld: 100 mg/dL — ABNORMAL HIGH (ref 70–99)
Potassium: 4.1 mmol/L (ref 3.5–5.1)
Sodium: 135 mmol/L (ref 135–145)
Total Bilirubin: 0.7 mg/dL (ref 0.3–1.2)
Total Protein: 6.7 g/dL (ref 6.5–8.1)

## 2023-02-07 MED ORDER — SODIUM CHLORIDE 0.9 % IV SOLN
4.9000 mg/kg | Freq: Once | INTRAVENOUS | Status: AC
  Administered 2023-02-07: 450 mg via INTRAVENOUS
  Filled 2023-02-07: qty 16

## 2023-02-07 MED ORDER — FLUOROURACIL CHEMO INJECTION 2.5 GM/50ML
400.0000 mg/m2 | Freq: Once | INTRAVENOUS | Status: AC
  Administered 2023-02-07: 850 mg via INTRAVENOUS
  Filled 2023-02-07: qty 17

## 2023-02-07 MED ORDER — ATROPINE SULFATE 1 MG/ML IV SOLN
0.5000 mg | Freq: Once | INTRAVENOUS | Status: AC | PRN
  Administered 2023-02-07: 0.5 mg via INTRAVENOUS
  Filled 2023-02-07: qty 1

## 2023-02-07 MED ORDER — LEUCOVORIN CALCIUM INJECTION 350 MG
400.0000 mg/m2 | Freq: Once | INTRAMUSCULAR | Status: AC
  Administered 2023-02-07: 856 mg via INTRAVENOUS
  Filled 2023-02-07: qty 42.8

## 2023-02-07 MED ORDER — PALONOSETRON HCL INJECTION 0.25 MG/5ML
0.2500 mg | Freq: Once | INTRAVENOUS | Status: AC
  Administered 2023-02-07: 0.25 mg via INTRAVENOUS
  Filled 2023-02-07: qty 5

## 2023-02-07 MED ORDER — SODIUM CHLORIDE 0.9 % IV SOLN
150.0000 mg/m2 | Freq: Once | INTRAVENOUS | Status: AC
  Administered 2023-02-07: 320 mg via INTRAVENOUS
  Filled 2023-02-07: qty 15

## 2023-02-07 MED ORDER — SODIUM CHLORIDE 0.9 % IV SOLN
2400.0000 mg/m2 | INTRAVENOUS | Status: DC
  Administered 2023-02-07: 5000 mg via INTRAVENOUS
  Filled 2023-02-07: qty 100

## 2023-02-07 MED ORDER — SODIUM CHLORIDE 0.9 % IV SOLN
Freq: Once | INTRAVENOUS | Status: AC
  Filled 2023-02-07: qty 250

## 2023-02-07 MED ORDER — DEXAMETHASONE SODIUM PHOSPHATE 10 MG/ML IJ SOLN
10.0000 mg | Freq: Once | INTRAMUSCULAR | Status: AC
  Administered 2023-02-07: 10 mg via INTRAVENOUS
  Filled 2023-02-07: qty 1

## 2023-02-07 NOTE — Progress Notes (Signed)
Bevacizumab dose 450 mg today per Dr Smith Robert.  Ebony Hail, Pharm.D., CPP 02/07/2023@11 :37 AM

## 2023-02-07 NOTE — Progress Notes (Signed)
Hematology/Oncology Consult note [facility]  Telephone:([phone] [phone]  Patient Care Team: [name], MD as PCP - General (Internal Medicine) Creig Hines, MD as Consulting Physician (Hematology and Oncology) Leafy Ro, MD as Consulting Physician (General Surgery) Toney Reil, MD as Consulting Physician (Gastroenterology) Benita Gutter, RN as Oncology Nurse Navigator   Name of the patient: Connor Wilkinson  [id number]  1985-10-26   Date of visit: 02/07/23  Diagnosis- history of stage III colon cancer now with recurrent metastatic disease    Chief complaint/ Reason for visit-on treatment assessment prior to cycle 2 of palliative FOLFIRI chemotherapy  Heme/Onc history: patient is a 37 year old male with a past medical history significant for mild bipolar disorder who is a ward of the stateAnd was admitted to the hospital for severe anemia.  He was found to have a hemoglobin of 4.5/18.7 on admission.  He received blood transfusion as well as IV iron and his hemoglobin is presently up to an 8.  He underwent colonoscopy which showed malignant partially obstructing tumor that was circumferential 80 cm proximal to the anus.  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  CT chest abdomen and pelvis with contrast did not show any evidence of distant metastatic disease.  2 small sclerotic densities in the acetabulum and proximal right femur likely benign process.  Pathologically enlarged lymph nodes superior to circumferential colonic lesion suggesting metastatic adenopathy.   Patient underwent right hemicolectomy with Dr. Everlene Farrier on 07/12/2021.  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  Vermiform appendix negative for tumor.  Radial margin positive  for tumor (2 lymph nodes at margin with adenocarcinoma).  Proximal and distal margins negative.  7 out of 56 lymph nodes positive for metastatic adenocarcinoma.  Tumor deposits not applicable.  No lymphovascular or perineural invasion seen.  PT4APN2B.  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  BRAF testing was negative.  Genetic testing was offered to the patient but he declined   Fertility conservation was also discussed an option of sperm banking at [facility] was discussed with the patient and his healthcare power of attorney Connor Wilkinson.  There would be a one-time cost involved for sperm banking and a yearly cost for preserving the specimen.  Overall chances of infertility with FOLFOX is low but possible.  Patient and his healthcare power of attorney have discussed their options and have decided not to proceed with fertility conservation at this time   Patient completed 12 cycles of adjuvant FOLFOX chemotherapy in followed by adjuvant radiation therapy in September 2023    CT scan and PET scan showed concern for metastatic disease.  18 x 16 mm left lower lobe lung nodule with an SUV of 2.4.  Progressive abdominal/retroperitoneal adenopathy with lymph nodes measuring between 6 mm to 18 mm which were PET avid.  Lung biopsy was negative for malignancy.  Retroperitoneal adenopathy was biopsied and was consistent with mucinous adenocarcinoma of colon primary.  Tissue Tempus testing ATM, BRCA2, PIK3CA mutation.  No evidence of K-ras BRAF or NRAS mutation  Interval history-patient tolerated cycle 1 of FOLFIRI chemotherapy well except for mild diarrhea which lasted for 2 days and did not require any medical intervention.  Denies any specific complaints at this time  ECOG PS- 0 Pain scale- 0   Review  of systems- Review of Systems  Constitutional:  Negative for chills, fever, malaise/fatigue and weight loss.  HENT:  Negative for congestion, ear  discharge and nosebleeds.   Eyes:  Negative for blurred vision.  Respiratory:  Negative for cough, hemoptysis, sputum production, shortness of breath and wheezing.   Cardiovascular:  Negative for chest pain, palpitations, orthopnea and claudication.  Gastrointestinal:  Negative for abdominal pain, blood in stool, constipation, diarrhea, heartburn, melena, nausea and vomiting.  Genitourinary:  Negative for dysuria, flank pain, frequency, hematuria and urgency.  Musculoskeletal:  Negative for back pain, joint pain and myalgias.  Skin:  Negative for rash.  Neurological:  Negative for dizziness, tingling, focal weakness, seizures, weakness and headaches.  Endo/Heme/Allergies:  Does not bruise/bleed easily.  Psychiatric/Behavioral:  Negative for depression and suicidal ideas. The patient does not have insomnia.       Allergies  Allergen Reactions   Geodon [Ziprasidone Hcl] Anaphylaxis     Past Medical History:  Diagnosis Date   Bipolar 1 disorder (HCC)    Colon cancer (HCC)    Lung nodule 09/2022   PTSD (post-traumatic stress disorder)      Past Surgical History:  Procedure Laterality Date   BRONCHIAL NEEDLE ASPIRATION BIOPSY  10/12/2022   Procedure: BRONCHIAL NEEDLE ASPIRATION BIOPSIES;  Surgeon: Raechel Chute, MD;  Location: [facility] ENDOSCOPY;  Service: Pulmonary;;   COLON RESECTION N/A 07/12/2021   Procedure: HAND ASSISTED LAPAROSCOPIC COLON RESECTION;  Surgeon: Leafy Ro, MD;  Location: [facility] ORS;  Service: General;  Laterality: N/A;   COLONOSCOPY WITH PROPOFOL N/A 07/06/2021   Procedure: COLONOSCOPY WITH PROPOFOL;  Surgeon: Toney Reil, MD;  Location: [facility] ENDOSCOPY;  Service: Gastroenterology;  Laterality: N/A;   COLONOSCOPY WITH PROPOFOL N/A 07/07/2021   Procedure: COLONOSCOPY WITH PROPOFOL;  Surgeon: Toney Reil, MD;  Location: [facility] ENDOSCOPY;  Service: Gastroenterology;  Laterality: N/A;   COLONOSCOPY WITH PROPOFOL N/A 08/02/2022   Procedure: COLONOSCOPY WITH  PROPOFOL;  Surgeon: Toney Reil, MD;  Location: [facility] ENDOSCOPY;  Service: Gastroenterology;  Laterality: N/AEarvin Hansen (Caregiver & Transporter) [phone] Connor Wilkinson [phone] will be available for phone consent   ESOPHAGOGASTRODUODENOSCOPY (EGD) WITH PROPOFOL N/A 07/06/2021   Procedure: ESOPHAGOGASTRODUODENOSCOPY (EGD) WITH PROPOFOL;  Surgeon: Toney Reil, MD;  Location: [facility] ENDOSCOPY;  Service: Gastroenterology;  Laterality: N/A;   GIVENS CAPSULE STUDY N/A 07/06/2021   Procedure: GIVENS CAPSULE STUDY;  Surgeon: Toney Reil, MD;  Location: [facility] ENDOSCOPY;  Service: Gastroenterology;  Laterality: N/A;   IR IMAGING GUIDED PORT INSERTION  01/19/2023   IR REMOVAL TUN ACCESS W/ PORT W/O FL MOD SED  02/15/2022   PORTACATH PLACEMENT Right 07/22/2021   Procedure: INSERTION PORT-A-CATH;  Surgeon: Leafy Ro, MD;  Location: [facility] ORS;  Service: General;  Laterality: Right;    Social History   Socioeconomic History   Marital status: Single    Spouse name: Not on file   Number of children: Not on file   Years of education: Not on file   Highest education level: Not on file  Occupational History   Not on file  Tobacco Use   Smoking status: Every Day    Current packs/day: 0.25    Types: Cigarettes   Smokeless tobacco: Current   Tobacco comments:    4 cigarettes a day - khj 09/28/2022  Vaping Use   Vaping status: Every Day   Devices: elfbar  Substance and Sexual Activity   Alcohol use: Not Currently   Drug use: Not Currently  Sexual activity: Not Currently  Other Topics Concern   Not on file  Social History Narrative   Not on file   Social Determinants of Health   Financial Resource Strain: Not on file  Food Insecurity: Not on file  Transportation Needs: Not on file  Physical Activity: Not on file  Stress: Not on file  Social Connections: Not on file  Intimate Partner Violence: Not on file    Family History  Problem Relation Age of Onset    Kidney cancer Father    Breast cancer Maternal Aunt      Current Outpatient Medications:    acetaminophen (TYLENOL) 325 MG tablet, Take 2 tablets (650 mg total) by mouth every 6 (six) hours as needed for mild pain or moderate pain., Disp: 30 tablet, Rfl: 0   ARIPiprazole (ABILIFY) 10 MG tablet, Take 10 mg by mouth in the morning., Disp: , Rfl:    ARIPiprazole ER (ABILIFY MAINTENA) 400 MG PRSY prefilled syringe, Inject 400 mg into the muscle every 30 (thirty) days., Disp: , Rfl:    benztropine (COGENTIN) 2 MG tablet, Take 2 mg by mouth at bedtime., Disp: , Rfl:    busPIRone (BUSPAR) 10 MG tablet, Take 10 mg by mouth 2 (two) times daily., Disp: , Rfl:    dexamethasone (DECADRON) 4 MG tablet, Take 2 tablets (8 mg total) by mouth daily. Start the day after chemotherapy for 2 days. Take with food., Disp: 8 tablet, Rfl: 5   FLUoxetine (PROZAC) 20 MG capsule, Take 20 mg by mouth daily., Disp: , Rfl:    ibuprofen (ADVIL) 600 MG tablet, Take 600 mg by mouth every 6 (six) hours as needed for moderate pain., Disp: , Rfl:    lidocaine-prilocaine (EMLA) cream, Apply 1 Application topically See admin instructions. Apply to affected area once as directed 1 hour before the pt. Comes to get chemo, Disp: 30 g, Rfl: 2   loperamide (IMODIUM) 2 MG capsule, Take 2 tabs by mouth with first loose stool, then 1 tab with each additional loose stool as needed. Do not exceed 8 tabs in a 24-hour period, Disp: 60 capsule, Rfl: 0   Melatonin 10 MG CAPS, Take 10 mg by mouth at bedtime., Disp: , Rfl:    ondansetron (ZOFRAN) 8 MG tablet, Take 8 mg by mouth 2 (two) times daily as needed for nausea or vomiting., Disp: , Rfl:    ondansetron (ZOFRAN) 8 MG tablet, Take 1 tablet (8 mg total) by mouth every 8 (eight) hours as needed for nausea, vomiting or refractory nausea / vomiting. Start on the third day after chemotherapy., Disp: 30 tablet, Rfl: 1   oxyCODONE (OXY IR/ROXICODONE) 5 MG immediate release tablet, Take 5 mg by mouth  every 6 (six) hours as needed for severe pain or breakthrough pain., Disp: , Rfl:    polyethylene glycol (MIRALAX / GLYCOLAX) 17 g packet, Take 17 g by mouth daily as needed (constipation)., Disp: 14 each, Rfl: 0   prazosin (MINIPRESS) 2 MG capsule, Take 2 mg by mouth 2 (two) times daily., Disp: , Rfl:    prochlorperazine (COMPAZINE) 10 MG tablet, Take 10 mg by mouth every 6 (six) hours as needed for nausea or vomiting., Disp: , Rfl:    prochlorperazine (COMPAZINE) 10 MG tablet, Take 1 tablet (10 mg total) by mouth every 6 (six) hours as needed for nausea or vomiting., Disp: 30 tablet, Rfl: 1 No current facility-administered medications for this visit.  Facility-Administered Medications Ordered in Other Visits:    fluorouracil (  ADRUCIL) 5,000 mg in sodium chloride 0.9 % 150 mL chemo infusion, 2,400 mg/m2 (Treatment Plan Recorded), Intravenous, 1 day or 1 dose, Creig Hines, MD   fluorouracil (ADRUCIL) chemo injection 850 mg, 400 mg/m2 (Treatment Plan Recorded), Intravenous, Once, Creig Hines, MD   irinotecan (CAMPTOSAR) 320 mg in sodium chloride 0.9 % 500 mL chemo infusion, 150 mg/m2 (Treatment Plan Recorded), Intravenous, Once, Creig Hines, MD, Last Rate: 344 mL/hr at 02/07/23 1238, 320 mg at 02/07/23 1238   leucovorin 856 mg in sodium chloride 0.9 % 250 mL infusion, 400 mg/m2 (Treatment Plan Recorded), Intravenous, Once, Creig Hines, MD, Last Rate: 195 mL/hr at 02/07/23 1237, 856 mg at 02/07/23 1237  Physical exam:  Vitals:   02/07/23 1041  BP: 114/73  Pulse: 86  Temp: (!) 97.5 F (36.4 C)  TempSrc: Tympanic  SpO2: 100%  Weight: 198 lb (89.8 kg)   Physical Exam Cardiovascular:     Rate and Rhythm: Normal rate and regular rhythm.     Heart sounds: Normal heart sounds.  Pulmonary:     Effort: Pulmonary effort is normal.     Breath sounds: Normal breath sounds.  Skin:    General: Skin is warm and dry.  Neurological:     Mental Status: He is alert and oriented to person,  place, and time.         Latest Ref Rng & Units 02/07/2023   10:24 AM  CMP  Glucose 70 - 99 mg/dL 161   BUN 6 - 20 mg/dL 16   Creatinine 0.96 - 1.24 mg/dL 0.45   Sodium 409 - 811 mmol/L 135   Potassium 3.5 - 5.1 mmol/L 4.1   Chloride 98 - 111 mmol/L 106   CO2 22 - 32 mmol/L 25   Calcium 8.9 - 10.3 mg/dL 8.8   Total Protein 6.5 - 8.1 g/dL 6.7   Total Bilirubin 0.3 - 1.2 mg/dL 0.7   Alkaline Phos 38 - 126 U/L 54   AST 15 - 41 U/L 23   ALT 0 - 44 U/L 21       Latest Ref Rng & Units 02/07/2023   10:24 AM  CBC  WBC 4.0 - 10.5 K/uL 3.8   Hemoglobin 13.0 - 17.0 g/dL 91.4   Hematocrit 78.2 - 52.0 % 42.1   Platelets 150 - 400 K/uL 180     No images are attached to the encounter.  IR IMAGING GUIDED PORT INSERTION  Result Date: 01/19/2023 INDICATION: colon cancer EXAM: IMPLANTED PORT A CATH PLACEMENT WITH ULTRASOUND AND FLUOROSCOPIC GUIDANCE MEDICATIONS: None ANESTHESIA/SEDATION: Moderate (conscious) sedation was employed during this procedure. A total of Versed 1 mg and Fentanyl 100 mcg was administered intravenously. Moderate Sedation Time: 24 minutes. The patient's level of consciousness and vital signs were monitored continuously by radiology nursing throughout the procedure under my direct supervision. FLUOROSCOPY TIME:  Fluoroscopic dose; 1 mGy COMPLICATIONS: None immediate. PROCEDURE: The procedure, risks, benefits, and alternatives were explained to the patient. Questions regarding the procedure were encouraged and answered. The patient understands and consents to the procedure. The RIGHT neck and chest were prepped with chlorhexidine in a sterile fashion, and a sterile drape was applied covering the operative field. Maximum barrier sterile technique with sterile gowns and gloves were used for the procedure. A timeout was performed prior to the initiation of the procedure. Local anesthesia was provided with 1% lidocaine with epinephrine. After creating a small venotomy incision, a  micropuncture kit was utilized to access the  internal jugular vein under direct, real-time ultrasound guidance. Ultrasound image documentation was performed. The microwire was kinked to measure appropriate catheter length. A subcutaneous port pocket was then created along the upper chest wall utilizing a combination of sharp and blunt dissection. The pocket was irrigated with sterile saline. A single lumen Non-ISP power injectable port was chosen for placement. The 8 Fr catheter was tunneled from the port pocket site to the venotomy incision. The port was placed in the pocket. The external catheter was trimmed to appropriate length. At the venotomy, an 8 Fr peel-away sheath was placed over a guidewire under fluoroscopic guidance. The catheter was then placed through the sheath and the sheath was removed. Final catheter positioning was confirmed and documented with a fluoroscopic spot radiograph. The port was accessed with a Huber needle, aspirated and flushed with heparinized saline. The port pocket incision was closed with interrupted 3-0 Vicryl suture then Dermabond was applied, including at the venotomy incision. Dressings were placed. The patient tolerated the procedure well without immediate post procedural complication. IMPRESSION: Successful placement of a RIGHT internal jugular approach power injectable Port-A-Cath. The tip of the catheter is positioned within the proximal RIGHT atrium. The catheter is ready for immediate use. Roanna Banning, MD [facility] Radiology Electronically Signed   By: Roanna Banning M.D.   On: 01/19/2023 12:33     Assessment and plan- Patient is a 37 y.o. male  with h/o stage III colon cancer now with metastatic disease in the lungs and retroperitoneal adenopathy.  He is here for on treatment assessment prior to cycle 2 of palliative FOLFIRI Avastin chemotherapy  Counts okay to proceed with cycle 2 of palliative FOLFIRI Avastin  chemotherapy today.  He is tolerating it well so far.  Blood pressure remained stable.Urine protein remains trace.  I will see him back in the 2 weeks for cycle 3 of chemotherapy.  Plan to repeat scans after 6 cycles.  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  Tumor mutational burden was high and therefore Rande Lawman remains an option for him down the line should he have progression on FOLFIRI chemotherapy.  I again offered genetic testing to him today and he would like to hold off for now   Visit Diagnosis 1. Adenocarcinoma of colon (HCC)   2. Encounter for antineoplastic chemotherapy      Dr. Owens Shark, MD, MPH [facility] [phone] 02/07/2023 12:59 PM

## 2023-02-07 NOTE — Patient Instructions (Signed)
[facility]  Discharge Instructions: Thank you for choosing [facility] to provide your oncology and hematology care.  If you have a lab appointment with the Cancer Center, please go directly to the Cancer Center and check in at the registration area.  Wear comfortable clothing and clothing appropriate for easy access to any Portacath or PICC line.   We strive to give you quality time with your provider. You may need to reschedule your appointment if you arrive late (15 or more minutes).  Arriving late affects you and other patients whose appointments are after yours.  Also, if you miss three or more appointments without notifying the office, you may be dismissed from the clinic at the provider's discretion.      For prescription refill requests, have your pharmacy contact our office and allow 72 hours for refills to be completed.    To help prevent nausea and vomiting after your treatment, we encourage you to take your nausea medication as directed.  BELOW ARE SYMPTOMS THAT SHOULD BE REPORTED IMMEDIATELY: *FEVER GREATER THAN 100.4 F (38 C) OR HIGHER *CHILLS OR SWEATING *NAUSEA AND VOMITING THAT IS NOT CONTROLLED WITH YOUR NAUSEA MEDICATION *UNUSUAL SHORTNESS OF BREATH *UNUSUAL BRUISING OR BLEEDING *URINARY PROBLEMS (pain or burning when urinating, or frequent urination) *BOWEL PROBLEMS (unusual diarrhea, constipation, pain near the anus) TENDERNESS IN MOUTH AND THROAT WITH OR WITHOUT PRESENCE OF ULCERS (sore throat, sores in mouth, or a toothache) UNUSUAL RASH, SWELLING OR PAIN   Items with * indicate a potential emergency and should be followed up as soon as possible or go to the Emergency Department if any problems should occur.  Please show the CHEMOTHERAPY ALERT CARD or IMMUNOTHERAPY ALERT CARD at check-in to the Emergency Department and triage nurse.  Should you have questions after your visit or need to cancel or reschedule your  appointment, please contact [facility]  [phone] and follow the prompts.  Office hours are 8:00 a.m. to 4:30 p.m. Monday - Friday. Please note that voicemails left after 4:00 p.m. may not be returned until the following business day.  We are closed weekends and major holidays. You have access to a nurse at all times for urgent questions. Please call the main number to the clinic [phone] and follow the prompts.  For any non-urgent questions, you may also contact your provider using MyChart. We now offer e-Visits for anyone 18 and older to request care online for non-urgent symptoms. For details visit [email or web address].   Also download the MyChart app! Go to the app store, search "MyChart", open the app, select [facility], and log in with your MyChart username and password.    

## 2023-02-07 NOTE — Progress Notes (Signed)
Patient needs refills on all of his medications.

## 2023-02-08 ENCOUNTER — Other Ambulatory Visit: Payer: Self-pay

## 2023-02-08 LAB — CEA: CEA: 6.2 ng/mL — ABNORMAL HIGH (ref 0.0–4.7)

## 2023-02-09 ENCOUNTER — Inpatient Hospital Stay: Payer: MEDICAID

## 2023-02-09 VITALS — BP 126/79 | HR 83 | Temp 98.0°F | Resp 18

## 2023-02-09 DIAGNOSIS — C189 Malignant neoplasm of colon, unspecified: Secondary | ICD-10-CM

## 2023-02-09 DIAGNOSIS — Z5111 Encounter for antineoplastic chemotherapy: Secondary | ICD-10-CM | POA: Diagnosis not present

## 2023-02-09 MED ORDER — HEPARIN SOD (PORK) LOCK FLUSH 100 UNIT/ML IV SOLN
500.0000 [IU] | Freq: Once | INTRAVENOUS | Status: AC | PRN
  Administered 2023-02-09: 500 [IU]
  Filled 2023-02-09: qty 5

## 2023-02-09 MED ORDER — SODIUM CHLORIDE 0.9% FLUSH
10.0000 mL | INTRAVENOUS | Status: DC | PRN
  Administered 2023-02-09: 10 mL
  Filled 2023-02-09: qty 10

## 2023-02-15 NOTE — Addendum Note (Signed)
Encounter addended by: Edward Qualia on: 02/15/2023 12:09 PM  Actions taken: Imaging Exam ended

## 2023-02-21 ENCOUNTER — Other Ambulatory Visit: Payer: Self-pay | Admitting: *Deleted

## 2023-02-21 ENCOUNTER — Inpatient Hospital Stay (HOSPITAL_BASED_OUTPATIENT_CLINIC_OR_DEPARTMENT_OTHER): Payer: MEDICAID | Admitting: Oncology

## 2023-02-21 ENCOUNTER — Inpatient Hospital Stay: Payer: MEDICAID | Attending: Oncology

## 2023-02-21 ENCOUNTER — Inpatient Hospital Stay: Payer: MEDICAID

## 2023-02-21 ENCOUNTER — Encounter: Payer: Self-pay | Admitting: Oncology

## 2023-02-21 VITALS — BP 119/77 | HR 84 | Temp 97.6°F | Resp 16 | Ht 72.0 in | Wt 201.8 lb

## 2023-02-21 DIAGNOSIS — Z5189 Encounter for other specified aftercare: Secondary | ICD-10-CM | POA: Diagnosis not present

## 2023-02-21 DIAGNOSIS — T451X5A Adverse effect of antineoplastic and immunosuppressive drugs, initial encounter: Secondary | ICD-10-CM

## 2023-02-21 DIAGNOSIS — C786 Secondary malignant neoplasm of retroperitoneum and peritoneum: Secondary | ICD-10-CM | POA: Diagnosis not present

## 2023-02-21 DIAGNOSIS — F17210 Nicotine dependence, cigarettes, uncomplicated: Secondary | ICD-10-CM | POA: Insufficient documentation

## 2023-02-21 DIAGNOSIS — Z803 Family history of malignant neoplasm of breast: Secondary | ICD-10-CM | POA: Insufficient documentation

## 2023-02-21 DIAGNOSIS — Z923 Personal history of irradiation: Secondary | ICD-10-CM | POA: Diagnosis not present

## 2023-02-21 DIAGNOSIS — Z5111 Encounter for antineoplastic chemotherapy: Secondary | ICD-10-CM | POA: Insufficient documentation

## 2023-02-21 DIAGNOSIS — K521 Toxic gastroenteritis and colitis: Secondary | ICD-10-CM | POA: Diagnosis not present

## 2023-02-21 DIAGNOSIS — C189 Malignant neoplasm of colon, unspecified: Secondary | ICD-10-CM

## 2023-02-21 DIAGNOSIS — C7801 Secondary malignant neoplasm of right lung: Secondary | ICD-10-CM | POA: Diagnosis not present

## 2023-02-21 DIAGNOSIS — Z8051 Family history of malignant neoplasm of kidney: Secondary | ICD-10-CM | POA: Diagnosis not present

## 2023-02-21 DIAGNOSIS — C7802 Secondary malignant neoplasm of left lung: Secondary | ICD-10-CM | POA: Insufficient documentation

## 2023-02-21 DIAGNOSIS — D701 Agranulocytosis secondary to cancer chemotherapy: Secondary | ICD-10-CM | POA: Diagnosis not present

## 2023-02-21 LAB — CBC WITH DIFFERENTIAL (CANCER CENTER ONLY)
Abs Immature Granulocytes: 0.01 10*3/uL (ref 0.00–0.07)
Basophils Absolute: 0 10*3/uL (ref 0.0–0.1)
Basophils Relative: 1 %
Eosinophils Absolute: 0.1 10*3/uL (ref 0.0–0.5)
Eosinophils Relative: 3 %
HCT: 40.4 % (ref 39.0–52.0)
Hemoglobin: 13.6 g/dL (ref 13.0–17.0)
Immature Granulocytes: 0 %
Lymphocytes Relative: 33 %
Lymphs Abs: 1.2 10*3/uL (ref 0.7–4.0)
MCH: 31.1 pg (ref 26.0–34.0)
MCHC: 33.7 g/dL (ref 30.0–36.0)
MCV: 92.4 fL (ref 80.0–100.0)
Monocytes Absolute: 0.4 10*3/uL (ref 0.1–1.0)
Monocytes Relative: 11 %
Neutro Abs: 1.8 10*3/uL (ref 1.7–7.7)
Neutrophils Relative %: 52 %
Platelet Count: 155 10*3/uL (ref 150–400)
RBC: 4.37 MIL/uL (ref 4.22–5.81)
RDW: 12.9 % (ref 11.5–15.5)
WBC Count: 3.5 10*3/uL — ABNORMAL LOW (ref 4.0–10.5)
nRBC: 0 % (ref 0.0–0.2)

## 2023-02-21 LAB — CMP (CANCER CENTER ONLY)
ALT: 42 U/L (ref 0–44)
AST: 38 U/L (ref 15–41)
Albumin: 4.2 g/dL (ref 3.5–5.0)
Alkaline Phosphatase: 55 U/L (ref 38–126)
Anion gap: 6 (ref 5–15)
BUN: 13 mg/dL (ref 6–20)
CO2: 26 mmol/L (ref 22–32)
Calcium: 9 mg/dL (ref 8.9–10.3)
Chloride: 103 mmol/L (ref 98–111)
Creatinine: 0.93 mg/dL (ref 0.61–1.24)
GFR, Estimated: >60 mL/min (ref >60)
Glucose, Bld: 115 mg/dL — ABNORMAL HIGH (ref 70–99)
Potassium: 3.9 mmol/L (ref 3.5–5.1)
Sodium: 135 mmol/L (ref 135–145)
Total Bilirubin: 0.5 mg/dL (ref <1.2)
Total Protein: 6.8 g/dL (ref 6.5–8.1)

## 2023-02-21 LAB — TOTAL PROTEIN, URINE DIPSTICK: Protein, ur: NEGATIVE mg/dL

## 2023-02-21 MED ORDER — FLUOROURACIL CHEMO INJECTION 2.5 GM/50ML
400.0000 mg/m2 | Freq: Once | INTRAVENOUS | Status: AC
  Administered 2023-02-21: 850 mg via INTRAVENOUS
  Filled 2023-02-21: qty 17

## 2023-02-21 MED ORDER — ATROPINE SULFATE 1 MG/ML IV SOLN
0.5000 mg | Freq: Once | INTRAVENOUS | Status: AC | PRN
  Administered 2023-02-21: 0.5 mg via INTRAVENOUS
  Filled 2023-02-21: qty 1

## 2023-02-21 MED ORDER — SODIUM CHLORIDE 0.9 % IV SOLN
Freq: Once | INTRAVENOUS | Status: AC
  Filled 2023-02-21: qty 250

## 2023-02-21 MED ORDER — SODIUM CHLORIDE 0.9 % IV SOLN
5.0000 mg/kg | Freq: Once | INTRAVENOUS | Status: AC
  Administered 2023-02-21: 500 mg via INTRAVENOUS
  Filled 2023-02-21: qty 16

## 2023-02-21 MED ORDER — PALONOSETRON HCL INJECTION 0.25 MG/5ML
0.2500 mg | Freq: Once | INTRAVENOUS | Status: AC
  Administered 2023-02-21: 0.25 mg via INTRAVENOUS
  Filled 2023-02-21: qty 5

## 2023-02-21 MED ORDER — DEXAMETHASONE SODIUM PHOSPHATE 10 MG/ML IJ SOLN
10.0000 mg | Freq: Once | INTRAMUSCULAR | Status: AC
Start: 2023-02-21 — End: 2023-02-21
  Administered 2023-02-21: 10 mg via INTRAVENOUS
  Filled 2023-02-21: qty 1

## 2023-02-21 MED ORDER — LEUCOVORIN CALCIUM INJECTION 350 MG
400.0000 mg/m2 | Freq: Once | INTRAMUSCULAR | Status: AC
  Administered 2023-02-21: 856 mg via INTRAVENOUS
  Filled 2023-02-21: qty 42.8

## 2023-02-21 MED ORDER — SODIUM CHLORIDE 0.9 % IV SOLN
2400.0000 mg/m2 | INTRAVENOUS | Status: DC
  Administered 2023-02-21: 5000 mg via INTRAVENOUS
  Filled 2023-02-21: qty 100

## 2023-02-21 MED ORDER — IRINOTECAN HCL CHEMO INJECTION 100 MG/5ML
150.0000 mg/m2 | Freq: Once | INTRAVENOUS | Status: AC
  Administered 2023-02-21: 320 mg via INTRAVENOUS
  Filled 2023-02-21: qty 15

## 2023-02-21 NOTE — Progress Notes (Signed)
Hematology/Oncology Consult note [facility]  [phone] Fax:([phone]  Patient Care Team: Connor Mustache, MD as PCP - General (Internal Medicine) Connor Hines, MD as Consulting Physician (Hematology and Oncology) Connor Ro, MD as Consulting Physician (General Surgery) Connor Reil, MD as Consulting Physician (Gastroenterology) Connor Gutter, RN as Oncology Nurse Navigator   Name of the patient: [facility]  [id number]  06-22-1985   Date of visit: 02/21/23  Diagnosis- history of stage III colon cancer now with recurrent metastatic disease    Chief complaint/ Reason for visit-on treatment assessment prior to cycle 3 of palliative FOLFIRI chemotherapy  Heme/Onc history: patient is a 37 year old male with a past medical history significant for mild bipolar disorder who is a ward of the stateAnd was admitted to the [facility] for severe anemia.  He was found to have a hemoglobin of 4.5/18.7 on admission.  He received blood transfusion as well as IV iron and his hemoglobin is presently up to an 8.  He underwent colonoscopy which showed malignant partially obstructing tumor that was circumferential 80 cm proximal to the anus.  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  CT chest abdomen and pelvis with contrast did not show any evidence of distant metastatic disease.  2 small sclerotic densities in the acetabulum and proximal right femur likely benign process.  Pathologically enlarged lymph nodes superior to circumferential colonic lesion suggesting metastatic adenopathy.   Patient underwent right hemicolectomy with Dr. Everlene [facility] on 07/12/2021.  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  Vermiform appendix negative for tumor.  Radial margin positive  for tumor (2 lymph nodes at margin with adenocarcinoma).  Proximal and distal margins negative.  7 out of 56 lymph nodes positive for metastatic adenocarcinoma.  Tumor deposits not applicable.  No lymphovascular or perineural invasion seen.  PT4APN2B.  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  BRAF testing was negative.  Genetic testing was offered to the patient but he declined   Fertility conservation was also discussed an option of sperm banking at [facility] was discussed with the patient and his healthcare power of attorney [facility].  There would be a one-time cost involved for sperm banking and a yearly cost for preserving the specimen.  Overall chances of infertility with FOLFOX is low but possible.  Patient and his healthcare power of attorney have discussed their options and have decided not to proceed with fertility conservation at this time   Patient completed 12 cycles of adjuvant FOLFOX chemotherapy in followed by adjuvant radiation therapy in September 2023    CT scan and PET scan showed concern for metastatic disease.  18 x 16 mm left lower lobe lung nodule with an SUV of 2.4.  Progressive abdominal/retroperitoneal adenopathy with lymph nodes measuring between 6 mm to 18 mm which were PET avid.  Lung biopsy was negative for malignancy.  Retroperitoneal adenopathy was biopsied and was consistent with mucinous adenocarcinoma of colon primary.  Tissue Tempus testing ATM, BRCA2, PIK3CA mutation.  MLH1. No evidence of K-ras BRAF or NRAS mutation    Interval history-occasional diarrhea with irinotecan but is otherwise tolerating chemotherapy well without any significant side effects  ECOG PS- 0 Pain scale- 0   Review of systems- Review of Systems  Constitutional:  Negative for chills, fever, malaise/fatigue and  weight loss.  HENT:  Negative for congestion, ear discharge and nosebleeds.   Eyes:  Negative for blurred vision.   Respiratory:  Negative for cough, hemoptysis, sputum production, shortness of breath and wheezing.   Cardiovascular:  Negative for chest pain, palpitations, orthopnea and claudication.  Gastrointestinal:  Positive for diarrhea. Negative for abdominal pain, blood in stool, constipation, heartburn, melena, nausea and vomiting.  Genitourinary:  Negative for dysuria, flank pain, frequency, hematuria and urgency.  Musculoskeletal:  Negative for back pain, joint pain and myalgias.  Skin:  Negative for rash.  Neurological:  Negative for dizziness, tingling, focal weakness, seizures, weakness and headaches.  Endo/Heme/Allergies:  Does not bruise/bleed easily.  Psychiatric/Behavioral:  Negative for depression and suicidal ideas. The patient does not have insomnia.       Allergies  Allergen Reactions   Geodon [Ziprasidone Hcl] Anaphylaxis     Past Medical History:  Diagnosis Date   Bipolar 1 disorder (HCC)    Colon cancer (HCC)    Lung nodule 09/2022   PTSD (post-traumatic stress disorder)      Past Surgical History:  Procedure Laterality Date   BRONCHIAL NEEDLE ASPIRATION BIOPSY  10/12/2022   Procedure: BRONCHIAL NEEDLE ASPIRATION BIOPSIES;  Surgeon: Connor Chute, MD;  Location: [facility];  Service: Pulmonary;;   COLON RESECTION N/A 07/12/2021   Procedure: HAND ASSISTED LAPAROSCOPIC COLON RESECTION;  Surgeon: Connor Ro, MD;  Location: [facility];  Service: General;  Laterality: N/A;   COLONOSCOPY WITH PROPOFOL N/A 07/06/2021   Procedure: COLONOSCOPY WITH PROPOFOL;  Surgeon: Connor Reil, MD;  Location: [facility];  Service: Gastroenterology;  Laterality: N/A;   COLONOSCOPY WITH PROPOFOL N/A 07/07/2021   Procedure: COLONOSCOPY WITH PROPOFOL;  Surgeon: Connor Reil, MD;  Location: [facility];  Service: Gastroenterology;  Laterality: N/A;   COLONOSCOPY WITH PROPOFOL N/A 08/02/2022   Procedure: COLONOSCOPY WITH PROPOFOL;  Surgeon: Connor Reil, MD;   Location: [facility];  Service: Gastroenterology;  Laterality: N/AEarvin [facility] (Caregiver & Transporter) [phone] [facility] [phone] will be available for phone consent   ESOPHAGOGASTRODUODENOSCOPY (EGD) WITH PROPOFOL N/A 07/06/2021   Procedure: ESOPHAGOGASTRODUODENOSCOPY (EGD) WITH PROPOFOL;  Surgeon: Connor Reil, MD;  Location: [facility];  Service: Gastroenterology;  Laterality: N/A;   GIVENS CAPSULE STUDY N/A 07/06/2021   Procedure: GIVENS CAPSULE STUDY;  Surgeon: Connor Reil, MD;  Location: [facility];  Service: Gastroenterology;  Laterality: N/A;   IR IMAGING GUIDED PORT INSERTION  01/19/2023   IR REMOVAL TUN ACCESS W/ PORT W/O FL MOD SED  02/15/2022   PORTACATH PLACEMENT Right 07/22/2021   Procedure: INSERTION PORT-A-CATH;  Surgeon: Connor Ro, MD;  Location: [facility];  Service: General;  Laterality: Right;    Social History   Socioeconomic History   Marital status: Single    Spouse name: Not on file   Number of children: Not on file   Years of education: Not on file   Highest education level: Not on file  Occupational History   Not on file  Tobacco Use   Smoking status: Every Day    Current packs/day: 0.25    Types: Cigarettes   Smokeless tobacco: Current   Tobacco comments:    4 cigarettes a day - khj 09/28/2022  Vaping Use   Vaping status: Every Day   Devices: elfbar  Substance and Sexual Activity   Alcohol use: Not Currently   Drug use: Not Currently   Sexual activity: Not Currently  Other Topics Concern   Not on  file  Social History Narrative   Not on file   Social Determinants of Health   Financial Resource Strain: Not on file  Food Insecurity: Not on file  Transportation Needs: Not on file  Physical Activity: Not on file  Stress: Not on file  Social Connections: Not on file  Intimate Partner Violence: Not on file    Family History  Problem Relation Age of Onset   Kidney cancer Father    Breast cancer  Maternal Aunt      Current Outpatient Medications:    acetaminophen (TYLENOL) 325 MG tablet, Take 2 tablets (650 mg total) by mouth every 6 (six) hours as needed for mild pain or moderate pain., Disp: 30 tablet, Rfl: 0   ARIPiprazole (ABILIFY) 10 MG tablet, Take 10 mg by mouth in the morning., Disp: , Rfl:    ARIPiprazole ER (ABILIFY MAINTENA) 400 MG PRSY prefilled syringe, Inject 400 mg into the muscle every 30 (thirty) days., Disp: , Rfl:    benztropine (COGENTIN) 2 MG tablet, Take 2 mg by mouth at bedtime., Disp: , Rfl:    busPIRone (BUSPAR) 10 MG tablet, Take 10 mg by mouth 2 (two) times daily., Disp: , Rfl:    dexamethasone (DECADRON) 4 MG tablet, Take 2 tablets (8 mg total) by mouth daily. Start the day after chemotherapy for 2 days. Take with food., Disp: 8 tablet, Rfl: 5   FLUoxetine (PROZAC) 20 MG capsule, Take 20 mg by mouth daily., Disp: , Rfl:    ibuprofen (ADVIL) 600 MG tablet, Take 600 mg by mouth every 6 (six) hours as needed for moderate pain., Disp: , Rfl:    lidocaine-prilocaine (EMLA) cream, Apply 1 Application topically See admin instructions. Apply to affected area once as directed 1 hour before the pt. Comes to get chemo, Disp: 30 g, Rfl: 2   loperamide (IMODIUM) 2 MG capsule, Take 2 tabs by mouth with first loose stool, then 1 tab with each additional loose stool as needed. Do not exceed 8 tabs in a 24-hour period, Disp: 60 capsule, Rfl: 0   Melatonin 10 MG CAPS, Take 10 mg by mouth at bedtime., Disp: , Rfl:    ondansetron (ZOFRAN) 8 MG tablet, Take 8 mg by mouth 2 (two) times daily as needed for nausea or vomiting., Disp: , Rfl:    ondansetron (ZOFRAN) 8 MG tablet, Take 1 tablet (8 mg total) by mouth every 8 (eight) hours as needed for nausea, vomiting or refractory nausea / vomiting. Start on the third day after chemotherapy., Disp: 30 tablet, Rfl: 1   oxyCODONE (OXY IR/ROXICODONE) 5 MG immediate release tablet, Take 5 mg by mouth every 6 (six) hours as needed for severe  pain or breakthrough pain., Disp: , Rfl:    polyethylene glycol (MIRALAX / GLYCOLAX) 17 g packet, Take 17 g by mouth daily as needed (constipation)., Disp: 14 each, Rfl: 0   prazosin (MINIPRESS) 2 MG capsule, Take 2 mg by mouth 2 (two) times daily., Disp: , Rfl:    prochlorperazine (COMPAZINE) 10 MG tablet, Take 10 mg by mouth every 6 (six) hours as needed for nausea or vomiting., Disp: , Rfl:    prochlorperazine (COMPAZINE) 10 MG tablet, Take 1 tablet (10 mg total) by mouth every 6 (six) hours as needed for nausea or vomiting., Disp: 30 tablet, Rfl: 1 No current facility-administered medications for this visit.  Facility-Administered Medications Ordered in Other Visits:    fluorouracil (ADRUCIL) 5,000 mg in sodium chloride 0.9 % 150 mL chemo infusion,  2,400 mg/m2 (Treatment Plan Recorded), Intravenous, 1 day or 1 dose, [facility], MD   fluorouracil (ADRUCIL) chemo injection 850 mg, 400 mg/m2 (Treatment Plan Recorded), Intravenous, Once, [facility], MD   irinotecan (CAMPTOSAR) 320 mg in sodium chloride 0.9 % 500 mL chemo infusion, 150 mg/m2 (Treatment Plan Recorded), Intravenous, Once, Connor Hines, MD, Last Rate: 344 mL/hr at 02/21/23 1258, 320 mg at 02/21/23 1258   leucovorin 856 mg in sodium chloride 0.9 % 250 mL infusion, 400 mg/m2 (Treatment Plan Recorded), Intravenous, Once, [facility], MD, Last Rate: 195 mL/hr at 02/21/23 1300, 856 mg at 02/21/23 1300  Physical exam:  Vitals:   02/21/23 1022  BP: 119/77  Pulse: 84  Resp: 16  Temp: 97.6 F (36.4 C)  TempSrc: Tympanic  SpO2: 96%  Weight: 201 lb 12.8 oz (91.5 kg)  Height: 6' (1.829 m)   Physical Exam Cardiovascular:     Rate and Rhythm: Normal rate and regular rhythm.     Heart sounds: Normal heart sounds.  Pulmonary:     Effort: Pulmonary effort is normal.     Breath sounds: Normal breath sounds.  Skin:    General: Skin is warm and dry.  Neurological:     Mental Status: He is alert and oriented to person, place,  and time.         Latest Ref Rng & Units 02/21/2023   10:08 AM  CMP  Glucose 70 - 99 mg/dL 829   BUN 6 - 20 mg/dL 13   Creatinine 5.62 - 1.24 mg/dL 1.30   Sodium 865 - 784 mmol/L 135   Potassium 3.5 - 5.1 mmol/L 3.9   Chloride 98 - 111 mmol/L 103   CO2 22 - 32 mmol/L 26   Calcium 8.9 - 10.3 mg/dL 9.0   Total Protein 6.5 - 8.1 g/dL 6.8   Total Bilirubin <6.9 mg/dL 0.5   Alkaline Phos 38 - 126 U/L 55   AST 15 - 41 U/L 38   ALT 0 - 44 U/L 42       Latest Ref Rng & Units 02/21/2023   10:08 AM  CBC  WBC 4.0 - 10.5 K/uL 3.5   Hemoglobin 13.0 - 17.0 g/dL 62.9   Hematocrit 52.8 - 52.0 % 40.4   Platelets 150 - 400 K/uL 155      Assessment and plan- Patient is a 37 y.o. male  with h/o stage III colon cancer now with metastatic disease in the lungs and retroperitoneal adenopathy.  He is here for on treatment assessment prior to cycle 3 of palliative FOLFIRI Avastin chemotherapy  Counts okay to proceed with cycle 3 of FOLFIRI Avastin chemotherapy today.  Blood pressure is stable and urine protein is negative.  I will see him back in 2 weeks for cycle 4 and check a CEA at that time.  Overall CEA is trending down.  Plan to repeat scans after 6 cycles.  Chemo-induced diarrhea: I have encouraged him to take as needed Imodium for the same.   Visit Diagnosis 1. Adenocarcinoma of colon (HCC)   2. Encounter for antineoplastic chemotherapy   3. Chemotherapy induced diarrhea      Dr. Owens Shark, MD, MPH [facility] [phone] 02/21/2023 1:18 PM

## 2023-02-21 NOTE — Patient Instructions (Signed)
[facility] - A DEPT OF [facility]  Discharge Instructions: Thank you for choosing [facility] to provide your oncology and hematology care.  If you have a lab appointment with the Cancer Center, please go directly to the Cancer Center and check in at the registration area.  Wear comfortable clothing and clothing appropriate for easy access to any Portacath or PICC line.   We strive to give you quality time with your provider. You may need to reschedule your appointment if you arrive late (15 or more minutes).  Arriving late affects you and other patients whose appointments are after yours.  Also, if you miss three or more appointments without notifying the office, you may be dismissed from the clinic at the provider's discretion.      For prescription refill requests, have your pharmacy contact our office and allow 72 hours for refills to be completed.    Today you received the following chemotherapy and/or immunotherapy agents Vegzelma, Irinotecan, Leucovorin & Adrucil       To help prevent nausea and vomiting after your treatment, we encourage you to take your nausea medication as directed.  BELOW ARE SYMPTOMS THAT SHOULD BE REPORTED IMMEDIATELY: *FEVER GREATER THAN 100.4 F (38 C) OR HIGHER *CHILLS OR SWEATING *NAUSEA AND VOMITING THAT IS NOT CONTROLLED WITH YOUR NAUSEA MEDICATION *UNUSUAL SHORTNESS OF BREATH *UNUSUAL BRUISING OR BLEEDING *URINARY PROBLEMS (pain or burning when urinating, or frequent urination) *BOWEL PROBLEMS (unusual diarrhea, constipation, pain near the anus) TENDERNESS IN MOUTH AND THROAT WITH OR WITHOUT PRESENCE OF ULCERS (sore throat, sores in mouth, or a toothache) UNUSUAL RASH, SWELLING OR PAIN  UNUSUAL VAGINAL DISCHARGE OR ITCHING   Items with * indicate a potential emergency and should be followed up as soon as possible or go to the Emergency Department if any problems should occur.  Please show the  CHEMOTHERAPY ALERT CARD or IMMUNOTHERAPY ALERT CARD at check-in to the Emergency Department and triage nurse.  Should you have questions after your visit or need to cancel or reschedule your appointment, please contact [facility] - A DEPT OF [facility]  [phone] and follow the prompts.  Office hours are 8:00 a.m. to 4:30 p.m. Monday - Friday. Please note that voicemails left after 4:00 p.m. may not be returned until the following business day.  We are closed weekends and major holidays. You have access to a nurse at all times for urgent questions. Please call the main number to the clinic [phone] and follow the prompts.  For any non-urgent questions, you may also contact your provider using MyChart. We now offer e-Visits for anyone 25 and older to request care online for non-urgent symptoms. For details visit [email or web address].   Also download the MyChart app! Go to the app store, search "MyChart", open the app, select [facility], and log in with your MyChart username and password.

## 2023-02-22 ENCOUNTER — Other Ambulatory Visit: Payer: Self-pay

## 2023-02-23 ENCOUNTER — Other Ambulatory Visit: Payer: Self-pay | Admitting: Oncology

## 2023-02-23 ENCOUNTER — Inpatient Hospital Stay: Payer: MEDICAID

## 2023-02-23 VITALS — BP 131/80 | HR 94 | Temp 98.0°F | Resp 18

## 2023-02-23 DIAGNOSIS — C189 Malignant neoplasm of colon, unspecified: Secondary | ICD-10-CM

## 2023-02-23 MED ORDER — HEPARIN SOD (PORK) LOCK FLUSH 100 UNIT/ML IV SOLN
500.0000 [IU] | Freq: Once | INTRAVENOUS | Status: DC | PRN
  Filled 2023-02-23: qty 5

## 2023-02-23 MED ORDER — PANTOPRAZOLE SODIUM 20 MG PO TBEC: 20.0000 mg | DELAYED_RELEASE_TABLET | Freq: Every day | ORAL | 3 refills | Status: DC

## 2023-02-23 MED ORDER — SODIUM CHLORIDE 0.9% FLUSH
10.0000 mL | INTRAVENOUS | Status: DC | PRN
  Filled 2023-02-23: qty 10

## 2023-03-07 ENCOUNTER — Inpatient Hospital Stay: Payer: MEDICAID

## 2023-03-07 ENCOUNTER — Inpatient Hospital Stay: Payer: MEDICAID | Admitting: Oncology

## 2023-03-07 ENCOUNTER — Encounter: Payer: Self-pay | Admitting: Oncology

## 2023-03-07 VITALS — BP 89/69 | HR 104 | Temp 97.2°F | Resp 18 | Wt 203.0 lb

## 2023-03-07 DIAGNOSIS — C189 Malignant neoplasm of colon, unspecified: Secondary | ICD-10-CM

## 2023-03-07 DIAGNOSIS — Z5111 Encounter for antineoplastic chemotherapy: Secondary | ICD-10-CM

## 2023-03-07 DIAGNOSIS — T451X5A Adverse effect of antineoplastic and immunosuppressive drugs, initial encounter: Secondary | ICD-10-CM | POA: Diagnosis not present

## 2023-03-07 DIAGNOSIS — D701 Agranulocytosis secondary to cancer chemotherapy: Secondary | ICD-10-CM | POA: Diagnosis not present

## 2023-03-07 DIAGNOSIS — Z5112 Encounter for antineoplastic immunotherapy: Secondary | ICD-10-CM

## 2023-03-07 LAB — CMP (CANCER CENTER ONLY)
ALT: 22 U/L (ref 0–44)
AST: 22 U/L (ref 15–41)
Albumin: 4 g/dL (ref 3.5–5.0)
Alkaline Phosphatase: 60 U/L (ref 38–126)
Anion gap: 9 (ref 5–15)
BUN: 11 mg/dL (ref 6–20)
CO2: 27 mmol/L (ref 22–32)
Calcium: 9.5 mg/dL (ref 8.9–10.3)
Chloride: 104 mmol/L (ref 98–111)
Creatinine: 1.14 mg/dL (ref 0.61–1.24)
GFR, Estimated: >60 mL/min (ref >60)
Glucose, Bld: 89 mg/dL (ref 70–99)
Potassium: 4.6 mmol/L (ref 3.5–5.1)
Sodium: 140 mmol/L (ref 135–145)
Total Bilirubin: 0.9 mg/dL (ref <1.2)
Total Protein: 6.9 g/dL (ref 6.5–8.1)

## 2023-03-07 LAB — CBC WITH DIFFERENTIAL (CANCER CENTER ONLY)
Abs Immature Granulocytes: 0.01 10*3/uL (ref 0.00–0.07)
Basophils Absolute: 0 10*3/uL (ref 0.0–0.1)
Basophils Relative: 1 %
Eosinophils Absolute: 0.1 10*3/uL (ref 0.0–0.5)
Eosinophils Relative: 2 %
HCT: 42.5 % (ref 39.0–52.0)
Hemoglobin: 14.2 g/dL (ref 13.0–17.0)
Immature Granulocytes: 0 %
Lymphocytes Relative: 38 %
Lymphs Abs: 1.2 10*3/uL (ref 0.7–4.0)
MCH: 31.3 pg (ref 26.0–34.0)
MCHC: 33.4 g/dL (ref 30.0–36.0)
MCV: 93.6 fL (ref 80.0–100.0)
Monocytes Absolute: 0.4 10*3/uL (ref 0.1–1.0)
Monocytes Relative: 14 %
Neutro Abs: 1.4 10*3/uL — ABNORMAL LOW (ref 1.7–7.7)
Neutrophils Relative %: 45 %
Platelet Count: 202 10*3/uL (ref 150–400)
RBC: 4.54 MIL/uL (ref 4.22–5.81)
RDW: 12.9 % (ref 11.5–15.5)
WBC Count: 3.1 10*3/uL — ABNORMAL LOW (ref 4.0–10.5)
nRBC: 0 % (ref 0.0–0.2)

## 2023-03-07 LAB — PROTEIN, URINE, RANDOM: Total Protein, Urine: <6 mg/dL

## 2023-03-07 MED ORDER — SODIUM CHLORIDE 0.9 % IV SOLN
Freq: Once | INTRAVENOUS | Status: AC
  Filled 2023-03-07: qty 250

## 2023-03-07 MED ORDER — FLUOROURACIL CHEMO INJECTION 5 GM/100ML
2400.0000 mg/m2 | INTRAVENOUS | Status: DC
  Administered 2023-03-07: 5000 mg via INTRAVENOUS
  Filled 2023-03-07: qty 100

## 2023-03-07 MED ORDER — PALONOSETRON HCL INJECTION 0.25 MG/5ML
0.2500 mg | Freq: Once | INTRAVENOUS | Status: AC
  Administered 2023-03-07: 0.25 mg via INTRAVENOUS
  Filled 2023-03-07: qty 5

## 2023-03-07 MED ORDER — SODIUM CHLORIDE 0.9% FLUSH
10.0000 mL | Freq: Once | INTRAVENOUS | Status: AC
  Administered 2023-03-07: 10 mL via INTRAVENOUS
  Filled 2023-03-07: qty 10

## 2023-03-07 MED ORDER — DEXAMETHASONE SODIUM PHOSPHATE 10 MG/ML IJ SOLN
10.0000 mg | Freq: Once | INTRAMUSCULAR | Status: AC
  Administered 2023-03-07: 10 mg via INTRAVENOUS
  Filled 2023-03-07: qty 1

## 2023-03-07 MED ORDER — HEPARIN SOD (PORK) LOCK FLUSH 100 UNIT/ML IV SOLN
500.0000 [IU] | Freq: Once | INTRAVENOUS | Status: DC
  Filled 2023-03-07: qty 5

## 2023-03-07 MED ORDER — BEVACIZUMAB-ADCD CHEMO INJECTION 400 MG/16ML
5.0000 mg/kg | Freq: Once | INTRAVENOUS | Status: AC
  Administered 2023-03-07: 500 mg via INTRAVENOUS
  Filled 2023-03-07: qty 16

## 2023-03-07 MED ORDER — FLUOROURACIL CHEMO INJECTION 2.5 GM/50ML
400.0000 mg/m2 | Freq: Once | INTRAVENOUS | Status: AC
  Administered 2023-03-07: 850 mg via INTRAVENOUS
  Filled 2023-03-07: qty 17

## 2023-03-07 MED ORDER — SODIUM CHLORIDE 0.9 % IV SOLN
400.0000 mg/m2 | Freq: Once | INTRAVENOUS | Status: AC
  Administered 2023-03-07: 856 mg via INTRAVENOUS
  Filled 2023-03-07: qty 42.8

## 2023-03-07 MED ORDER — SODIUM CHLORIDE 0.9 % IV SOLN
150.0000 mg/m2 | Freq: Once | INTRAVENOUS | Status: AC
  Administered 2023-03-07: 320 mg via INTRAVENOUS
  Filled 2023-03-07: qty 15

## 2023-03-07 NOTE — Progress Notes (Signed)
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  He was found to have a hemoglobin of 4.5/18.7 on admission.  He received blood transfusion as well as IV iron and his hemoglobin is presently up to an 8.  He underwent colonoscopy which showed malignant partially obstructing tumor that was circumferential 80 cm proximal to the anus.  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  CT chest abdomen and pelvis with contrast did not show any evidence of distant metastatic disease.  2 small sclerotic densities in the acetabulum and proximal right femur likely benign process.  Pathologically enlarged lymph nodes superior to circumferential colonic lesion suggesting metastatic adenopathy.   Patient underwent right hemicolectomy with Dr. Everlene Farrier on 07/12/2021.  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  Vermiform appendix negative for tumor.   Radial margin positive for tumor (2 lymph nodes at margin with adenocarcinoma).  Proximal and distal margins negative.  7 out of 56 lymph nodes positive for metastatic adenocarcinoma.  Tumor deposits not applicable.  No lymphovascular or perineural invasion seen.  PT4APN2B.  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  BRAF testing was negative.  Genetic testing was offered to the patient but he declined   Fertility conservation was also discussed an option of sperm banking at [facility] was discussed with the patient and his healthcare power of attorney [name].  There would be a one-time cost involved for sperm banking and a yearly cost for preserving the specimen.  Overall chances of infertility with FOLFOX is low but possible.  Patient and his healthcare power of attorney have discussed their options and have decided not to proceed with fertility conservation at this time   Patient completed 12 cycles of adjuvant FOLFOX chemotherapy in followed by adjuvant radiation therapy in September 2023    CT scan and PET scan showed concern for metastatic disease.  18 x 16 mm left lower lobe lung nodule with an SUV of 2.4.  Progressive abdominal/retroperitoneal adenopathy with lymph nodes measuring between 6 mm to 18 mm which were PET avid.  Lung biopsy was negative for malignancy.  Retroperitoneal adenopathy was biopsied and was consistent with mucinous adenocarcinoma of colon primary.  Tissue Tempus testing ATM, BRCA2, PIK3CA mutation.  MLH1 mutation. No evidence of K-ras BRAF or NRAS mutation.  Tumor mutational burden high 95th percentile MSI status could not be assessed because the tumor content is below 30%    Interval history-tolerating chemotherapy well and other than occasional diarrhea he denies other complaints at this  time  ECOG PS- 0 Pain scale- 0   Review of systems- Review of Systems  Constitutional:  Negative for chills, fever,  malaise/fatigue and weight loss.  HENT:  Negative for congestion, ear discharge and nosebleeds.   Eyes:  Negative for blurred vision.  Respiratory:  Negative for cough, hemoptysis, sputum production, shortness of breath and wheezing.   Cardiovascular:  Negative for chest pain, palpitations, orthopnea and claudication.  Gastrointestinal:  Negative for abdominal pain, blood in stool, constipation, diarrhea, heartburn, melena, nausea and vomiting.  Genitourinary:  Negative for dysuria, flank pain, frequency, hematuria and urgency.  Musculoskeletal:  Negative for back pain, joint pain and myalgias.  Skin:  Negative for rash.  Neurological:  Negative for dizziness, tingling, focal weakness, seizures, weakness and headaches.  Endo/Heme/Allergies:  Does not bruise/bleed easily.  Psychiatric/Behavioral:  Negative for depression and suicidal ideas. The patient does not have insomnia.       Allergies  Allergen Reactions   Geodon [Ziprasidone Hcl] Anaphylaxis     Past Medical History:  Diagnosis Date   Bipolar 1 disorder (HCC)    Colon cancer (HCC)    Lung nodule 09/2022   PTSD (post-traumatic stress disorder)      Past Surgical History:  Procedure Laterality Date   BRONCHIAL NEEDLE ASPIRATION BIOPSY  10/12/2022   Procedure: BRONCHIAL NEEDLE ASPIRATION BIOPSIES;  Surgeon: Raechel Chute, MD;  Location: [facility] ENDOSCOPY;  Service: Pulmonary;;   COLON RESECTION N/A 07/12/2021   Procedure: HAND ASSISTED LAPAROSCOPIC COLON RESECTION;  Surgeon: Leafy Ro, MD;  Location: [facility] ORS;  Service: General;  Laterality: N/A;   COLONOSCOPY WITH PROPOFOL N/A 07/06/2021   Procedure: COLONOSCOPY WITH PROPOFOL;  Surgeon: Toney Reil, MD;  Location: [facility] ENDOSCOPY;  Service: Gastroenterology;  Laterality: N/A;   COLONOSCOPY WITH PROPOFOL N/A 07/07/2021   Procedure: COLONOSCOPY WITH PROPOFOL;  Surgeon: Toney Reil, MD;  Location: [facility] ENDOSCOPY;  Service: Gastroenterology;  Laterality: N/A;    COLONOSCOPY WITH PROPOFOL N/A 08/02/2022   Procedure: COLONOSCOPY WITH PROPOFOL;  Surgeon: Toney Reil, MD;  Location[facility] ENDOSCOPY;  Service: Gastroenterology;  Laterality: N/AEarvin Hansen (Caregiver & Transporter) [phone] [name] [phone] will be available for phone consent   ESOPHAGOGASTRODUODENOSCOPY (EGD) WITH PROPOFOL N/A 07/06/2021   Procedure: ESOPHAGOGASTRODUODENOSCOPY (EGD) WITH PROPOFOL;  Surgeon: Toney Reil, MD;  Location: [facility] ENDOSCOPY;  Service: Gastroenterology;  Laterality: N/A;   GIVENS CAPSULE STUDY N/A 07/06/2021   Procedure: GIVENS CAPSULE STUDY;  Surgeon: Toney Reil, MD;  Location: [facility] ENDOSCOPY;  Service: Gastroenterology;  Laterality: N/A;   IR IMAGING GUIDED PORT INSERTION  01/19/2023   IR REMOVAL TUN ACCESS W/ PORT W/O FL MOD SED  02/15/2022   PORTACATH PLACEMENT Right 07/22/2021   Procedure: INSERTION PORT-A-CATH;  Surgeon: Leafy Ro, MD;  Location: [facility] ORS;  Service: General;  Laterality: Right;    Social History   Socioeconomic History   Marital status: Single    Spouse name: Not on file   Number of children: Not on file   Years of education: Not on file   Highest education level: Not on file  Occupational History   Not on file  Tobacco Use   Smoking status: Every Day    Current packs/day: 0.25    Types: Cigarettes   Smokeless tobacco: Current   Tobacco comments:    4 cigarettes a day - khj 09/28/2022  Vaping Use   Vaping status: Every Day   Devices: elfbar  Substance and Sexual Activity   Alcohol  use: Not Currently   Drug use: Not Currently   Sexual activity: Not Currently  Other Topics Concern   Not on file  Social History Narrative   Not on file   Social Determinants of Health   Financial Resource Strain: Not on file  Food Insecurity: Not on file  Transportation Needs: Not on file  Physical Activity: Not on file  Stress: Not on file  Social Connections: Not on file  Intimate Partner  Violence: Not on file    Family History  Problem Relation Age of Onset   Kidney cancer Father    Breast cancer Maternal Aunt      Current Outpatient Medications:    acetaminophen (TYLENOL) 325 MG tablet, Take 2 tablets (650 mg total) by mouth every 6 (six) hours as needed for mild pain or moderate pain., Disp: 30 tablet, Rfl: 0   ARIPiprazole (ABILIFY) 10 MG tablet, Take 10 mg by mouth in the morning., Disp: , Rfl:    ARIPiprazole ER (ABILIFY MAINTENA) 400 MG PRSY prefilled syringe, Inject 400 mg into the muscle every 30 (thirty) days., Disp: , Rfl:    benztropine (COGENTIN) 2 MG tablet, Take 2 mg by mouth at bedtime., Disp: , Rfl:    busPIRone (BUSPAR) 10 MG tablet, Take 10 mg by mouth 2 (two) times daily., Disp: , Rfl:    dexamethasone (DECADRON) 4 MG tablet, Take 2 tablets (8 mg total) by mouth daily. Start the day after chemotherapy for 2 days. Take with food., Disp: 8 tablet, Rfl: 5   FLUoxetine (PROZAC) 20 MG capsule, Take 20 mg by mouth daily., Disp: , Rfl:    ibuprofen (ADVIL) 600 MG tablet, Take 600 mg by mouth every 6 (six) hours as needed for moderate pain., Disp: , Rfl:    lidocaine-prilocaine (EMLA) cream, Apply 1 Application topically See admin instructions. Apply to affected area once as directed 1 hour before the pt. Comes to get chemo, Disp: 30 g, Rfl: 2   loperamide (IMODIUM) 2 MG capsule, Take 2 tabs by mouth with first loose stool, then 1 tab with each additional loose stool as needed. Do not exceed 8 tabs in a 24-hour period, Disp: 60 capsule, Rfl: 0   Melatonin 10 MG CAPS, Take 10 mg by mouth at bedtime., Disp: , Rfl:    ondansetron (ZOFRAN) 8 MG tablet, Take 8 mg by mouth 2 (two) times daily as needed for nausea or vomiting., Disp: , Rfl:    ondansetron (ZOFRAN) 8 MG tablet, Take 1 tablet (8 mg total) by mouth every 8 (eight) hours as needed for nausea, vomiting or refractory nausea / vomiting. Start on the third day after chemotherapy., Disp: 30 tablet, Rfl: 1    oxyCODONE (OXY IR/ROXICODONE) 5 MG immediate release tablet, Take 5 mg by mouth every 6 (six) hours as needed for severe pain or breakthrough pain., Disp: , Rfl:    pantoprazole (PROTONIX) 20 MG tablet, Take 1 tablet (20 mg total) by mouth daily., Disp: 30 tablet, Rfl: 3   polyethylene glycol (MIRALAX / GLYCOLAX) 17 g packet, Take 17 g by mouth daily as needed (constipation)., Disp: 14 each, Rfl: 0   prazosin (MINIPRESS) 2 MG capsule, Take 2 mg by mouth 2 (two) times daily., Disp: , Rfl:    prochlorperazine (COMPAZINE) 10 MG tablet, Take 10 mg by mouth every 6 (six) hours as needed for nausea or vomiting., Disp: , Rfl:    prochlorperazine (COMPAZINE) 10 MG tablet, Take 1 tablet (10 mg total) by mouth every  6 (six) hours as needed for nausea or vomiting., Disp: 30 tablet, Rfl: 1 No current facility-administered medications for this visit.  Facility-Administered Medications Ordered in Other Visits:    fluorouracil (ADRUCIL) 5,000 mg in sodium chloride 0.9 % 150 mL chemo infusion, 2,400 mg/m2 (Treatment Plan Recorded), Intravenous, 1 day or 1 dose, Creig Hines, MD   fluorouracil (ADRUCIL) chemo injection 850 mg, 400 mg/m2 (Treatment Plan Recorded), Intravenous, Once, Creig Hines, MD   heparin lock flush 100 unit/mL, 500 Units, Intravenous, Once, Creig Hines, MD   irinotecan (CAMPTOSAR) 320 mg in sodium chloride 0.9 % 500 mL chemo infusion, 150 mg/m2 (Treatment Plan Recorded), Intravenous, Once, Creig Hines, MD, Last Rate: 344 mL/hr at 03/07/23 1227, 320 mg at 03/07/23 1227   leucovorin 856 mg in sodium chloride 0.9 % 250 mL infusion, 400 mg/m2 (Treatment Plan Recorded), Intravenous, Once, Creig Hines, MD, Last Rate: 195 mL/hr at 03/07/23 1226, 856 mg at 03/07/23 1226  Physical exam:  Vitals:   03/07/23 0950  BP: (!) 89/69  Pulse: (!) 104  Resp: 18  Temp: (!) 97.2 F (36.2 C)  TempSrc: Tympanic  SpO2: 100%  Weight: 203 lb (92.1 kg)   Physical Exam Cardiovascular:     Rate and  Rhythm: Normal rate and regular rhythm.     Heart sounds: Normal heart sounds.  Pulmonary:     Effort: Pulmonary effort is normal.     Breath sounds: Normal breath sounds.  Abdominal:     General: Bowel sounds are normal.     Palpations: Abdomen is soft.  Skin:    General: Skin is warm and dry.  Neurological:     Mental Status: He is alert and oriented to person, place, and time.         Latest Ref Rng & Units 03/07/2023    9:23 AM  CMP  Glucose 70 - 99 mg/dL 89   BUN 6 - 20 mg/dL 11   Creatinine 8.65 - 1.24 mg/dL 7.84   Sodium 696 - 295 mmol/L 140   Potassium 3.5 - 5.1 mmol/L 4.6   Chloride 98 - 111 mmol/L 104   CO2 22 - 32 mmol/L 27   Calcium 8.9 - 10.3 mg/dL 9.5   Total Protein 6.5 - 8.1 g/dL 6.9   Total Bilirubin <2.8 mg/dL 0.9   Alkaline Phos 38 - 126 U/L 60   AST 15 - 41 U/L 22   ALT 0 - 44 U/L 22       Latest Ref Rng & Units 03/07/2023    9:23 AM  CBC  WBC 4.0 - 10.5 K/uL 3.1   Hemoglobin 13.0 - 17.0 g/dL 41.3   Hematocrit 24.4 - 52.0 % 42.5   Platelets 150 - 400 K/uL 202     Assessment and plan- Patient is a 37 y.o. male with h/o stage III colon cancer now with metastatic disease in the lungs and retroperitoneal adenopathy.  He is here for on treatment assessment prior to cycle 4 of palliative FOLFIRI Avastin chemotherapy  Counts okay to proceed with cycle 4 of palliative FOLFIRI Avastin chemotherapy today.  CEA has now normalized.  I will see him back in 2 weeks for cycle 5 and plans to repeat scans after 6 cycles.  Chemo induced neutropenia: He will receive Fulphila on day 3 of pump disconnect and we will decide when to give this based on his counts with every cycle.   Visit Diagnosis 1. Adenocarcinoma of colon (HCC)  2. Encounter for antineoplastic chemotherapy   3. Encounter for monoclonal antibody treatment for malignancy   4. Chemotherapy induced neutropenia (HCC)      Dr. Owens Shark, MD, MPH [facility] [phone] 03/07/2023 12:54 PM

## 2023-03-07 NOTE — Patient Instructions (Signed)
[facility] - [facility]  Discharge Instructions: Thank you for choosing [facility] to provide your oncology and hematology care.  If you have a lab appointment with the Cancer Center, please go directly to the Cancer Center and check in at the registration area.  Wear comfortable clothing and clothing appropriate for easy access to any Portacath or PICC line.   We strive to give you quality time with your provider. You may need to reschedule your appointment if you arrive late (15 or more minutes).  Arriving late affects you and other patients whose appointments are after yours.  Also, if you miss three or more appointments without notifying the office, you may be dismissed from the clinic at the provider's discretion.      For prescription refill requests, have your pharmacy contact our office and allow 72 hours for refills to be completed.    Today you received the following chemotherapy and/or immunotherapy agents Leucovorin, Irinotecan and Adrucil.      To help prevent nausea and vomiting after your treatment, we encourage you to take your nausea medication as directed.  BELOW ARE SYMPTOMS THAT SHOULD BE REPORTED IMMEDIATELY: *FEVER GREATER THAN 100.4 F (38 C) OR HIGHER *CHILLS OR SWEATING *NAUSEA AND VOMITING THAT IS NOT CONTROLLED WITH YOUR NAUSEA MEDICATION *UNUSUAL SHORTNESS OF BREATH *UNUSUAL BRUISING OR BLEEDING *URINARY PROBLEMS (pain or burning when urinating, or frequent urination) *BOWEL PROBLEMS (unusual diarrhea, constipation, pain near the anus) TENDERNESS IN MOUTH AND THROAT WITH OR WITHOUT PRESENCE OF ULCERS (sore throat, sores in mouth, or a toothache) UNUSUAL RASH, SWELLING OR PAIN  UNUSUAL VAGINAL DISCHARGE OR ITCHING   Items with * indicate a potential emergency and should be followed up as soon as possible or go to the Emergency Department if any problems should occur.  Please show the CHEMOTHERAPY ALERT  CARD or IMMUNOTHERAPY ALERT CARD at check-in to the Emergency Department and triage nurse.  Should you have questions after your visit or need to cancel or reschedule your appointment, please contact [facility] - A DEPT OF [facility]  [phone] and follow the prompts.  Office hours are 8:00 a.m. to 4:30 p.m. Monday - Friday. Please note that voicemails left after 4:00 p.m. may not be returned until the following business day.  We are closed weekends and major holidays. You have access to a nurse at all times for urgent questions. Please call the main number to the clinic [phone] and follow the prompts.  For any non-urgent questions, you may also contact your provider using MyChart. We now offer e-Visits for anyone 36 and older to request care online for non-urgent symptoms. For details visit [email or web address].   Also download the MyChart app! Go to the app store, search "MyChart", open the app, select [place], and log in with your MyChart username and password.

## 2023-03-08 ENCOUNTER — Other Ambulatory Visit: Payer: Self-pay

## 2023-03-08 LAB — CEA: CEA: 5.9 ng/mL — ABNORMAL HIGH (ref 0.0–4.7)

## 2023-03-09 ENCOUNTER — Inpatient Hospital Stay: Payer: MEDICAID

## 2023-03-09 VITALS — BP 127/68 | HR 100 | Resp 18

## 2023-03-09 DIAGNOSIS — Z5111 Encounter for antineoplastic chemotherapy: Secondary | ICD-10-CM | POA: Diagnosis not present

## 2023-03-09 DIAGNOSIS — C189 Malignant neoplasm of colon, unspecified: Secondary | ICD-10-CM

## 2023-03-09 MED ORDER — PEGFILGRASTIM-JMDB 6 MG/0.6ML ~~LOC~~ SOSY
6.0000 mg | PREFILLED_SYRINGE | Freq: Once | SUBCUTANEOUS | Status: AC
  Administered 2023-03-09: 6 mg via SUBCUTANEOUS
  Filled 2023-03-09: qty 0.6

## 2023-03-09 MED ORDER — SODIUM CHLORIDE 0.9% FLUSH
10.0000 mL | INTRAVENOUS | Status: DC | PRN
  Administered 2023-03-09: 10 mL
  Filled 2023-03-09: qty 10

## 2023-03-09 MED ORDER — HEPARIN SOD (PORK) LOCK FLUSH 100 UNIT/ML IV SOLN
500.0000 [IU] | Freq: Once | INTRAVENOUS | Status: AC | PRN
  Administered 2023-03-09: 500 [IU]
  Filled 2023-03-09: qty 5

## 2023-03-21 ENCOUNTER — Encounter: Payer: Self-pay | Admitting: Oncology

## 2023-03-21 ENCOUNTER — Inpatient Hospital Stay: Payer: MEDICAID

## 2023-03-21 ENCOUNTER — Inpatient Hospital Stay (HOSPITAL_BASED_OUTPATIENT_CLINIC_OR_DEPARTMENT_OTHER): Payer: MEDICAID | Admitting: Oncology

## 2023-03-21 ENCOUNTER — Inpatient Hospital Stay: Payer: MEDICAID | Attending: Oncology

## 2023-03-21 VITALS — BP 144/88 | HR 75 | Temp 97.8°F | Resp 18 | Wt 205.6 lb

## 2023-03-21 DIAGNOSIS — F17210 Nicotine dependence, cigarettes, uncomplicated: Secondary | ICD-10-CM | POA: Insufficient documentation

## 2023-03-21 DIAGNOSIS — Z8051 Family history of malignant neoplasm of kidney: Secondary | ICD-10-CM | POA: Insufficient documentation

## 2023-03-21 DIAGNOSIS — Z5112 Encounter for antineoplastic immunotherapy: Secondary | ICD-10-CM

## 2023-03-21 DIAGNOSIS — C7801 Secondary malignant neoplasm of right lung: Secondary | ICD-10-CM | POA: Diagnosis not present

## 2023-03-21 DIAGNOSIS — C189 Malignant neoplasm of colon, unspecified: Secondary | ICD-10-CM

## 2023-03-21 DIAGNOSIS — Z23 Encounter for immunization: Secondary | ICD-10-CM | POA: Diagnosis not present

## 2023-03-21 DIAGNOSIS — C786 Secondary malignant neoplasm of retroperitoneum and peritoneum: Secondary | ICD-10-CM | POA: Insufficient documentation

## 2023-03-21 DIAGNOSIS — Z803 Family history of malignant neoplasm of breast: Secondary | ICD-10-CM | POA: Insufficient documentation

## 2023-03-21 DIAGNOSIS — Z5111 Encounter for antineoplastic chemotherapy: Secondary | ICD-10-CM | POA: Diagnosis not present

## 2023-03-21 DIAGNOSIS — C7802 Secondary malignant neoplasm of left lung: Secondary | ICD-10-CM | POA: Insufficient documentation

## 2023-03-21 LAB — CBC WITH DIFFERENTIAL (CANCER CENTER ONLY)
Abs Immature Granulocytes: 0.34 10*3/uL — ABNORMAL HIGH (ref 0.00–0.07)
Basophils Absolute: 0.1 10*3/uL (ref 0.0–0.1)
Basophils Relative: 0 %
Eosinophils Absolute: 0.3 10*3/uL (ref 0.0–0.5)
Eosinophils Relative: 2 %
HCT: 43.8 % (ref 39.0–52.0)
Hemoglobin: 14.8 g/dL (ref 13.0–17.0)
Immature Granulocytes: 2 %
Lymphocytes Relative: 10 %
Lymphs Abs: 1.5 10*3/uL (ref 0.7–4.0)
MCH: 31.7 pg (ref 26.0–34.0)
MCHC: 33.8 g/dL (ref 30.0–36.0)
MCV: 93.8 fL (ref 80.0–100.0)
Monocytes Absolute: 0.5 10*3/uL (ref 0.1–1.0)
Monocytes Relative: 3 %
Neutro Abs: 13 10*3/uL — ABNORMAL HIGH (ref 1.7–7.7)
Neutrophils Relative %: 83 %
Platelet Count: 161 10*3/uL (ref 150–400)
RBC: 4.67 MIL/uL (ref 4.22–5.81)
RDW: 13.6 % (ref 11.5–15.5)
WBC Count: 15.8 10*3/uL — ABNORMAL HIGH (ref 4.0–10.5)
nRBC: 0.3 % — ABNORMAL HIGH (ref 0.0–0.2)

## 2023-03-21 LAB — CMP (CANCER CENTER ONLY)
ALT: 19 U/L (ref 0–44)
AST: 19 U/L (ref 15–41)
Albumin: 4 g/dL (ref 3.5–5.0)
Alkaline Phosphatase: 117 U/L (ref 38–126)
Anion gap: 8 (ref 5–15)
BUN: 14 mg/dL (ref 6–20)
CO2: 27 mmol/L (ref 22–32)
Calcium: 9.2 mg/dL (ref 8.9–10.3)
Chloride: 101 mmol/L (ref 98–111)
Creatinine: 1.06 mg/dL (ref 0.61–1.24)
GFR, Estimated: >60 mL/min (ref >60)
Glucose, Bld: 100 mg/dL — ABNORMAL HIGH (ref 70–99)
Potassium: 4.5 mmol/L (ref 3.5–5.1)
Sodium: 136 mmol/L (ref 135–145)
Total Bilirubin: 0.3 mg/dL (ref <1.2)
Total Protein: 7.1 g/dL (ref 6.5–8.1)

## 2023-03-21 LAB — PROTEIN, URINE, RANDOM: Total Protein, Urine: 10 mg/dL

## 2023-03-21 MED ORDER — ATROPINE SULFATE 1 MG/ML IV SOLN
0.5000 mg | Freq: Once | INTRAVENOUS | Status: AC | PRN
  Administered 2023-03-21: 0.5 mg via INTRAVENOUS
  Filled 2023-03-21: qty 1

## 2023-03-21 MED ORDER — SODIUM CHLORIDE 0.9 % IV SOLN
Freq: Once | INTRAVENOUS | Status: AC
  Filled 2023-03-21: qty 250

## 2023-03-21 MED ORDER — SODIUM CHLORIDE 0.9 % IV SOLN
5.0000 mg/kg | Freq: Once | INTRAVENOUS | Status: AC
  Administered 2023-03-21: 500 mg via INTRAVENOUS
  Filled 2023-03-21: qty 16

## 2023-03-21 MED ORDER — PALONOSETRON HCL INJECTION 0.25 MG/5ML
0.2500 mg | Freq: Once | INTRAVENOUS | Status: AC
Start: 2023-03-21 — End: 2023-03-21
  Administered 2023-03-21: 0.25 mg via INTRAVENOUS
  Filled 2023-03-21: qty 5

## 2023-03-21 MED ORDER — INFLUENZA VIRUS VACC SPLIT PF (FLUZONE) 0.5 ML IM SUSY
0.5000 mL | PREFILLED_SYRINGE | Freq: Once | INTRAMUSCULAR | Status: AC
  Administered 2023-03-21: 0.5 mL via INTRAMUSCULAR
  Filled 2023-03-21: qty 0.5

## 2023-03-21 MED ORDER — SODIUM CHLORIDE 0.9 % IV SOLN
2400.0000 mg/m2 | INTRAVENOUS | Status: DC
  Administered 2023-03-21: 5000 mg via INTRAVENOUS
  Filled 2023-03-21: qty 100

## 2023-03-21 MED ORDER — SODIUM CHLORIDE 0.9 % IV SOLN
150.0000 mg/m2 | Freq: Once | INTRAVENOUS | Status: AC
  Administered 2023-03-21: 320 mg via INTRAVENOUS
  Filled 2023-03-21: qty 15

## 2023-03-21 MED ORDER — FLUOROURACIL CHEMO INJECTION 2.5 GM/50ML
400.0000 mg/m2 | Freq: Once | INTRAVENOUS | Status: AC
  Administered 2023-03-21: 850 mg via INTRAVENOUS
  Filled 2023-03-21: qty 17

## 2023-03-21 MED ORDER — SODIUM CHLORIDE 0.9 % IV SOLN
400.0000 mg/m2 | Freq: Once | INTRAVENOUS | Status: AC
  Administered 2023-03-21: 856 mg via INTRAVENOUS
  Filled 2023-03-21: qty 42.8

## 2023-03-21 MED ORDER — DEXAMETHASONE SODIUM PHOSPHATE 10 MG/ML IJ SOLN
10.0000 mg | Freq: Once | INTRAMUSCULAR | Status: AC
  Administered 2023-03-21: 10 mg via INTRAVENOUS
  Filled 2023-03-21: qty 1

## 2023-03-21 NOTE — Patient Instructions (Signed)
[facility]  Discharge Instructions: Thank you for choosing [facility] to provide your oncology and hematology care.  If you have a lab appointment with the Cancer Center, please go directly to the Cancer Center and check in at the registration area.  Wear comfortable clothing and clothing appropriate for easy access to any Portacath or PICC line.   We strive to give you quality time with your provider. You may need to reschedule your appointment if you arrive late (15 or more minutes).  Arriving late affects you and other patients whose appointments are after yours.  Also, if you miss three or more appointments without notifying the office, you may be dismissed from the clinic at the provider's discretion.      For prescription refill requests, have your pharmacy contact our office and allow 72 hours for refills to be completed.    Today you received the following chemotherapy and/or immunotherapy agents  Bevacizumab-adcd, Irinotecan, leucovorin, fluorouracil-      To help prevent nausea and vomiting after your treatment, we encourage you to take your nausea medication as directed.  BELOW ARE SYMPTOMS THAT SHOULD BE REPORTED IMMEDIATELY: *FEVER GREATER THAN 100.4 F (38 C) OR HIGHER *CHILLS OR SWEATING *NAUSEA AND VOMITING THAT IS NOT CONTROLLED WITH YOUR NAUSEA MEDICATION *UNUSUAL SHORTNESS OF BREATH *UNUSUAL BRUISING OR BLEEDING *URINARY PROBLEMS (pain or burning when urinating, or frequent urination) *BOWEL PROBLEMS (unusual diarrhea, constipation, pain near the anus) TENDERNESS IN MOUTH AND THROAT WITH OR WITHOUT PRESENCE OF ULCERS (sore throat, sores in mouth, or a toothache) UNUSUAL RASH, SWELLING OR PAIN  UNUSUAL VAGINAL DISCHARGE OR ITCHING   Items with * indicate a potential emergency and should be followed up as soon as possible or go to the Emergency Department if any problems should occur.  Please show  the CHEMOTHERAPY ALERT CARD or IMMUNOTHERAPY ALERT CARD at check-in to the Emergency Department and triage nurse.  Should you have questions after your visit or need to cancel or reschedule your appointment, please contact [facility] - A DEPT OF [facility]  [phone] and follow the prompts.  Office hours are 8:00 a.m. to 4:30 p.m. Monday - Friday. Please note that voicemails left after 4:00 p.m. may not be returned until the following business day.  We are closed weekends and major holidays. You have access to a nurse at all times for urgent questions. Please call the main number to the clinic [phone] and follow the prompts.  For any non-urgent questions, you may also contact your provider using MyChart. We now offer e-Visits for anyone 4 and older to request care online for non-urgent symptoms. For details visit [email or web address].   Also download the MyChart app! Go to the app store, search "MyChart", open the app, select [facility], and log in with your MyChart username and password.

## 2023-03-22 ENCOUNTER — Encounter: Payer: Self-pay | Admitting: Oncology

## 2023-03-22 ENCOUNTER — Other Ambulatory Visit: Payer: Self-pay

## 2023-03-22 LAB — CEA: CEA: 5 ng/mL — ABNORMAL HIGH (ref 0.0–4.7)

## 2023-03-22 NOTE — Progress Notes (Signed)
Hematology/Oncology Consult note [facility]  Telephone:([phone] [phone]  Patient Care Team: [name], MD as PCP - General (Internal Medicine) Creig Hines, MD as Consulting Physician (Hematology and Oncology) Leafy Ro, MD as Consulting Physician (General Surgery) Toney Reil, MD as Consulting Physician (Gastroenterology) Benita Gutter, RN as Oncology Nurse Navigator   Name of the patient: Connor Wilkinson  [id number]  1985/12/02   Date of visit: 03/22/23  Diagnosis- history of stage III colon cancer now with recurrent metastatic disease      Chief complaint/ Reason for visit-on treatment assessment prior to cycle 5 of palliative FOLFIRI Avastin chemotherapy  Heme/Onc history:  patient is a 37 year old male with a past medical history significant for mild bipolar disorder who is a ward of the stateAnd was admitted to the hospital for severe anemia.  He was found to have a hemoglobin of 4.5/18.7 on admission.  He received blood transfusion as well as IV iron and his hemoglobin is presently up to an 8.  He underwent colonoscopy which showed malignant partially obstructing tumor that was circumferential 80 cm proximal to the anus.  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  CT chest abdomen and pelvis with contrast did not show any evidence of distant metastatic disease.  2 small sclerotic densities in the acetabulum and proximal right femur likely benign process.  Pathologically enlarged lymph nodes superior to circumferential colonic lesion suggesting metastatic adenopathy.   Patient underwent right hemicolectomy with Dr. Everlene Farrier on 07/12/2021.  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  Vermiform appendix negative for tumor.  Radial  margin positive for tumor (2 lymph nodes at margin with adenocarcinoma).  Proximal and distal margins negative.  7 out of 56 lymph nodes positive for metastatic adenocarcinoma.  Tumor deposits not applicable.  No lymphovascular or perineural invasion seen.  PT4APN2B.  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  BRAF testing was negative.  Genetic testing was offered to the patient but he declined   Fertility conservation was also discussed an option of sperm banking at [facility] was discussed with the patient and his healthcare power of attorney [name].  There would be a one-time cost involved for sperm banking and a yearly cost for preserving the specimen.  Overall chances of infertility with FOLFOX is low but possible.  Patient and his healthcare power of attorney have discussed their options and have decided not to proceed with fertility conservation at this time   Patient completed 12 cycles of adjuvant FOLFOX chemotherapy in followed by adjuvant radiation therapy in September 2023    CT scan and PET scan showed concern for metastatic disease.  18 x 16 mm left lower lobe lung nodule with an SUV of 2.4.  Progressive abdominal/retroperitoneal adenopathy with lymph nodes measuring between 6 mm to 18 mm which were PET avid.  Lung biopsy was negative for malignancy.  Retroperitoneal adenopathy was biopsied and was consistent with mucinous adenocarcinoma of colon primary.  Tissue Tempus testing ATM, BRCA2, PIK3CA mutation.  MLH1 mutation. No evidence of K-ras BRAF or NRAS mutation.  Tumor mutational burden high 95th percentile MSI status could not be assessed because the tumor content is below 30%      Interval history- He is doing well presently and tolerating chemotherapy without any significant  side effects.  Denies any nausea vomiting or diarrhea  ECOG PS- 0 Pain scale- 0   Review of systems- Review of Systems  Constitutional:   Negative for chills, fever, malaise/fatigue and weight loss.  HENT:  Negative for congestion, ear discharge and nosebleeds.   Eyes:  Negative for blurred vision.  Respiratory:  Negative for cough, hemoptysis, sputum production, shortness of breath and wheezing.   Cardiovascular:  Negative for chest pain, palpitations, orthopnea and claudication.  Gastrointestinal:  Negative for abdominal pain, blood in stool, constipation, diarrhea, heartburn, melena, nausea and vomiting.  Genitourinary:  Negative for dysuria, flank pain, frequency, hematuria and urgency.  Musculoskeletal:  Negative for back pain, joint pain and myalgias.  Skin:  Negative for rash.  Neurological:  Negative for dizziness, tingling, focal weakness, seizures, weakness and headaches.  Endo/Heme/Allergies:  Does not bruise/bleed easily.  Psychiatric/Behavioral:  Negative for depression and suicidal ideas. The patient does not have insomnia.       Allergies  Allergen Reactions   Geodon [Ziprasidone Hcl] Anaphylaxis     Past Medical History:  Diagnosis Date   Bipolar 1 disorder (HCC)    Colon cancer (HCC)    Lung nodule 09/2022   PTSD (post-traumatic stress disorder)      Past Surgical History:  Procedure Laterality Date   BRONCHIAL NEEDLE ASPIRATION BIOPSY  10/12/2022   Procedure: BRONCHIAL NEEDLE ASPIRATION BIOPSIES;  Surgeon: Raechel Chute, MD;  Location: [facility] ENDOSCOPY;  Service: Pulmonary;;   COLON RESECTION N/A 07/12/2021   Procedure: HAND ASSISTED LAPAROSCOPIC COLON RESECTION;  Surgeon: Leafy Ro, MD;  Location: [facility] ORS;  Service: General;  Laterality: N/A;   COLONOSCOPY WITH PROPOFOL N/A 07/06/2021   Procedure: COLONOSCOPY WITH PROPOFOL;  Surgeon: Toney Reil, MD;  Location: [facility] ENDOSCOPY;  Service: Gastroenterology;  Laterality: N/A;   COLONOSCOPY WITH PROPOFOL N/A 07/07/2021   Procedure: COLONOSCOPY WITH PROPOFOL;  Surgeon: Toney Reil, MD;  Location[facility] ENDOSCOPY;  Service:  Gastroenterology;  Laterality: N/A;   COLONOSCOPY WITH PROPOFOL N/A 08/02/2022   Procedure: COLONOSCOPY WITH PROPOFOL;  Surgeon: Toney Reil, MD;  Location: [facility] ENDOSCOPY;  Service: Gastroenterology;  Laterality: N/AEarvin Hansen (Caregiver & Transporter) [phone] [name] [phone] will be available for phone consent   ESOPHAGOGASTRODUODENOSCOPY (EGD) WITH PROPOFOL N/A 07/06/2021   Procedure: ESOPHAGOGASTRODUODENOSCOPY (EGD) WITH PROPOFOL;  Surgeon: Toney Reil, MD;  Location: [facility] ENDOSCOPY;  Service: Gastroenterology;  Laterality: N/A;   GIVENS CAPSULE STUDY N/A 07/06/2021   Procedure: GIVENS CAPSULE STUDY;  Surgeon: Toney Reil, MD;  Location: [facility] ENDOSCOPY;  Service: Gastroenterology;  Laterality: N/A;   IR IMAGING GUIDED PORT INSERTION  01/19/2023   IR REMOVAL TUN ACCESS W/ PORT W/O FL MOD SED  02/15/2022   PORTACATH PLACEMENT Right 07/22/2021   Procedure: INSERTION PORT-A-CATH;  Surgeon: Leafy Ro, MD;  Location: [facility] ORS;  Service: General;  Laterality: Right;    Social History   Socioeconomic History   Marital status: Single    Spouse name: Not on file   Number of children: Not on file   Years of education: Not on file   Highest education level: Not on file  Occupational History   Not on file  Tobacco Use   Smoking status: Every Day    Current packs/day: 0.25    Types: Cigarettes   Smokeless tobacco: Current   Tobacco comments:    4 cigarettes a day - khj 09/28/2022  Vaping Use   Vaping status: Every Day   Devices: elfbar  Substance and Sexual Activity   Alcohol use: Not Currently   Drug use: Not Currently   Sexual activity: Not Currently  Other Topics Concern   Not on file  Social History Narrative   Not on file   Social Determinants of Health   Financial Resource Strain: Not on file  Food Insecurity: Not on file  Transportation Needs: Not on file  Physical Activity: Not on file  Stress: Not on file  Social Connections:  Not on file  Intimate Partner Violence: Not on file    Family History  Problem Relation Age of Onset   Kidney cancer Father    Breast cancer Maternal Aunt      Current Outpatient Medications:    acetaminophen (TYLENOL) 325 MG tablet, Take 2 tablets (650 mg total) by mouth every 6 (six) hours as needed for mild pain or moderate pain., Disp: 30 tablet, Rfl: 0   ARIPiprazole (ABILIFY) 10 MG tablet, Take 10 mg by mouth in the morning., Disp: , Rfl:    ARIPiprazole ER (ABILIFY MAINTENA) 400 MG PRSY prefilled syringe, Inject 400 mg into the muscle every 30 (thirty) days., Disp: , Rfl:    benztropine (COGENTIN) 2 MG tablet, Take 2 mg by mouth at bedtime., Disp: , Rfl:    busPIRone (BUSPAR) 10 MG tablet, Take 10 mg by mouth 2 (two) times daily., Disp: , Rfl:    dexamethasone (DECADRON) 4 MG tablet, Take 2 tablets (8 mg total) by mouth daily. Start the day after chemotherapy for 2 days. Take with food., Disp: 8 tablet, Rfl: 5   FLUoxetine (PROZAC) 20 MG capsule, Take 20 mg by mouth daily., Disp: , Rfl:    ibuprofen (ADVIL) 600 MG tablet, Take 600 mg by mouth every 6 (six) hours as needed for moderate pain., Disp: , Rfl:    lidocaine-prilocaine (EMLA) cream, Apply 1 Application topically See admin instructions. Apply to affected area once as directed 1 hour before the pt. Comes to get chemo, Disp: 30 g, Rfl: 2   loperamide (IMODIUM) 2 MG capsule, Take 2 tabs by mouth with first loose stool, then 1 tab with each additional loose stool as needed. Do not exceed 8 tabs in a 24-hour period, Disp: 60 capsule, Rfl: 0   Melatonin 10 MG CAPS, Take 10 mg by mouth at bedtime., Disp: , Rfl:    ondansetron (ZOFRAN) 8 MG tablet, Take 8 mg by mouth 2 (two) times daily as needed for nausea or vomiting., Disp: , Rfl:    ondansetron (ZOFRAN) 8 MG tablet, Take 1 tablet (8 mg total) by mouth every 8 (eight) hours as needed for nausea, vomiting or refractory nausea / vomiting. Start on the third day after chemotherapy.,  Disp: 30 tablet, Rfl: 1   pantoprazole (PROTONIX) 20 MG tablet, Take 1 tablet (20 mg total) by mouth daily., Disp: 30 tablet, Rfl: 3   polyethylene glycol (MIRALAX / GLYCOLAX) 17 g packet, Take 17 g by mouth daily as needed (constipation)., Disp: 14 each, Rfl: 0   prazosin (MINIPRESS) 2 MG capsule, Take 2 mg by mouth 2 (two) times daily., Disp: , Rfl:    oxyCODONE (OXY IR/ROXICODONE) 5 MG immediate release tablet, Take 5 mg by mouth every 6 (six) hours as needed for severe pain or breakthrough pain. (Patient not taking: Reported on 03/21/2023), Disp: , Rfl:    prochlorperazine (COMPAZINE) 10 MG tablet, Take 10 mg by mouth every 6 (six) hours as needed for nausea or vomiting., Disp: , Rfl:   Physical exam:  Vitals:   Physical Exam Cardiovascular:     Rate and Rhythm: Normal rate and regular rhythm.     Heart sounds: Normal heart sounds.  Pulmonary:     Effort: Pulmonary effort is normal.     Breath sounds: Normal breath sounds.  Abdominal:     General: Bowel sounds are normal.     Palpations: Abdomen is soft.  Skin:    General: Skin is warm and dry.  Neurological:     Mental Status: He is alert and oriented to person, place, and time.         Latest Ref Rng & Units 03/21/2023   10:08 AM  CMP  Glucose 70 - 99 mg/dL 045   BUN 6 - 20 mg/dL 14   Creatinine 4.09 - 1.24 mg/dL 8.11   Sodium 914 - 782 mmol/L 136   Potassium 3.5 - 5.1 mmol/L 4.5   Chloride 98 - 111 mmol/L 101   CO2 22 - 32 mmol/L 27   Calcium 8.9 - 10.3 mg/dL 9.2   Total Protein 6.5 - 8.1 g/dL 7.1   Total Bilirubin <9.5 mg/dL 0.3   Alkaline Phos 38 - 126 U/L 117   AST 15 - 41 U/L 19   ALT 0 - 44 U/L 19       Latest Ref Rng & Units 03/21/2023   10:08 AM  CBC  WBC 4.0 - 10.5 K/uL 15.8   Hemoglobin 13.0 - 17.0 g/dL 62.1   Hematocrit 30.8 - 52.0 % 43.8   Platelets 150 - 400 K/uL 161     No images are attached to the encounter.  No results found.   Assessment and plan- Patient is a 37 y.o. male with h/o  stage III colon cancer now with metastatic disease in the lungs and retroperitoneal adenopathy.  He is here for on treatment assessment prior to cycle 5 of palliative FOLFIRI Avastin chemotherapy  Counts okay to proceed with cycle 5 of FOLFIRI Avastin chemotherapy today.  I will see him back in 2 weeks for cycle 6.  Plan to repeat scans after 6 cycles.  He will not receive Neulasta with this cycle.  CEA overall is trending down and he is tolerating chemotherapy well.  Blood pressure is stable and urine protein remains trace and therefore okay to proceed with a Avastin   Visit Diagnosis 1. Adenocarcinoma of colon (HCC)   2. Encounter for antineoplastic chemotherapy   3. Encounter for monoclonal antibody treatment for malignancy      Dr. Owens Shark, MD, MPH [facility] [phone] 03/22/2023 2:34 PM

## 2023-03-23 ENCOUNTER — Inpatient Hospital Stay: Payer: MEDICAID

## 2023-03-23 VITALS — BP 140/89 | HR 80 | Resp 18

## 2023-03-23 DIAGNOSIS — Z5111 Encounter for antineoplastic chemotherapy: Secondary | ICD-10-CM | POA: Diagnosis not present

## 2023-03-23 DIAGNOSIS — C189 Malignant neoplasm of colon, unspecified: Secondary | ICD-10-CM

## 2023-03-23 MED ORDER — SODIUM CHLORIDE 0.9% FLUSH
10.0000 mL | INTRAVENOUS | Status: DC | PRN
  Administered 2023-03-23: 10 mL
  Filled 2023-03-23: qty 10

## 2023-03-23 MED ORDER — HEPARIN SOD (PORK) LOCK FLUSH 100 UNIT/ML IV SOLN
500.0000 [IU] | Freq: Once | INTRAVENOUS | Status: AC | PRN
Start: 2023-03-23 — End: 2023-03-23
  Administered 2023-03-23: 500 [IU]
  Filled 2023-03-23: qty 5

## 2023-04-04 ENCOUNTER — Encounter: Payer: Self-pay | Admitting: Oncology

## 2023-04-04 ENCOUNTER — Inpatient Hospital Stay: Payer: MEDICAID

## 2023-04-04 ENCOUNTER — Inpatient Hospital Stay (HOSPITAL_BASED_OUTPATIENT_CLINIC_OR_DEPARTMENT_OTHER): Payer: MEDICAID | Admitting: Oncology

## 2023-04-04 VITALS — BP 111/76 | HR 85 | Temp 97.8°F | Resp 18

## 2023-04-04 VITALS — Wt 200.1 lb

## 2023-04-04 DIAGNOSIS — Z5111 Encounter for antineoplastic chemotherapy: Secondary | ICD-10-CM | POA: Diagnosis not present

## 2023-04-04 DIAGNOSIS — C189 Malignant neoplasm of colon, unspecified: Secondary | ICD-10-CM | POA: Diagnosis not present

## 2023-04-04 DIAGNOSIS — Z5112 Encounter for antineoplastic immunotherapy: Secondary | ICD-10-CM | POA: Diagnosis not present

## 2023-04-04 DIAGNOSIS — Z95828 Presence of other vascular implants and grafts: Secondary | ICD-10-CM

## 2023-04-04 LAB — CBC WITH DIFFERENTIAL (CANCER CENTER ONLY)
Abs Immature Granulocytes: 0.01 10*3/uL (ref 0.00–0.07)
Basophils Absolute: 0 10*3/uL (ref 0.0–0.1)
Basophils Relative: 0 %
Eosinophils Absolute: 2.6 10*3/uL — ABNORMAL HIGH (ref 0.0–0.5)
Eosinophils Relative: 42 %
HCT: 39.1 % (ref 39.0–52.0)
Hemoglobin: 12.7 g/dL — ABNORMAL LOW (ref 13.0–17.0)
Immature Granulocytes: 0 %
Lymphocytes Relative: 17 %
Lymphs Abs: 1 10*3/uL (ref 0.7–4.0)
MCH: 30.8 pg (ref 26.0–34.0)
MCHC: 32.5 g/dL (ref 30.0–36.0)
MCV: 94.9 fL (ref 80.0–100.0)
Monocytes Absolute: 0.5 10*3/uL (ref 0.1–1.0)
Monocytes Relative: 8 %
Neutro Abs: 2 10*3/uL (ref 1.7–7.7)
Neutrophils Relative %: 33 %
Platelet Count: 217 10*3/uL (ref 150–400)
RBC: 4.12 MIL/uL — ABNORMAL LOW (ref 4.22–5.81)
RDW: 14.4 % (ref 11.5–15.5)
WBC Count: 6.1 10*3/uL (ref 4.0–10.5)
nRBC: 0 % (ref 0.0–0.2)

## 2023-04-04 LAB — CMP (CANCER CENTER ONLY)
ALT: 21 U/L (ref 0–44)
AST: 21 U/L (ref 15–41)
Albumin: 3.7 g/dL (ref 3.5–5.0)
Alkaline Phosphatase: 72 U/L (ref 38–126)
Anion gap: 8 (ref 5–15)
BUN: 12 mg/dL (ref 6–20)
CO2: 22 mmol/L (ref 22–32)
Calcium: 8.8 mg/dL — ABNORMAL LOW (ref 8.9–10.3)
Chloride: 106 mmol/L (ref 98–111)
Creatinine: 1.03 mg/dL (ref 0.61–1.24)
GFR, Estimated: >60 mL/min (ref >60)
Glucose, Bld: 109 mg/dL — ABNORMAL HIGH (ref 70–99)
Potassium: 4.1 mmol/L (ref 3.5–5.1)
Sodium: 136 mmol/L (ref 135–145)
Total Bilirubin: 0.8 mg/dL (ref <1.2)
Total Protein: 6.4 g/dL — ABNORMAL LOW (ref 6.5–8.1)

## 2023-04-04 LAB — TOTAL PROTEIN, URINE DIPSTICK: Protein, ur: NEGATIVE mg/dL

## 2023-04-04 MED ORDER — FLUOROURACIL CHEMO INJECTION 2.5 GM/50ML
400.0000 mg/m2 | Freq: Once | INTRAVENOUS | Status: AC
  Administered 2023-04-04: 850 mg via INTRAVENOUS
  Filled 2023-04-04: qty 17

## 2023-04-04 MED ORDER — HEPARIN SOD (PORK) LOCK FLUSH 100 UNIT/ML IV SOLN
500.0000 [IU] | Freq: Once | INTRAVENOUS | Status: DC | PRN
  Filled 2023-04-04: qty 5

## 2023-04-04 MED ORDER — ATROPINE SULFATE 1 MG/ML IV SOLN
0.5000 mg | Freq: Once | INTRAVENOUS | Status: AC | PRN
  Administered 2023-04-04: 0.5 mg via INTRAVENOUS

## 2023-04-04 MED ORDER — SODIUM CHLORIDE 0.9 % IV SOLN
5.0000 mg/kg | Freq: Once | INTRAVENOUS | Status: AC
  Administered 2023-04-04: 500 mg via INTRAVENOUS
  Filled 2023-04-04: qty 16

## 2023-04-04 MED ORDER — SODIUM CHLORIDE 0.9% FLUSH
10.0000 mL | INTRAVENOUS | Status: DC | PRN
  Filled 2023-04-04: qty 10

## 2023-04-04 MED ORDER — LEUCOVORIN CALCIUM INJECTION 350 MG
400.0000 mg/m2 | Freq: Once | INTRAMUSCULAR | Status: AC
  Administered 2023-04-04: 856 mg via INTRAVENOUS
  Filled 2023-04-04: qty 42.8

## 2023-04-04 MED ORDER — PALONOSETRON HCL INJECTION 0.25 MG/5ML
0.2500 mg | Freq: Once | INTRAVENOUS | Status: AC
  Administered 2023-04-04: 0.25 mg via INTRAVENOUS
  Filled 2023-04-04: qty 5

## 2023-04-04 MED ORDER — IRINOTECAN HCL CHEMO INJECTION 100 MG/5ML
150.0000 mg/m2 | Freq: Once | INTRAVENOUS | Status: AC
  Administered 2023-04-04: 320 mg via INTRAVENOUS
  Filled 2023-04-04: qty 2

## 2023-04-04 MED ORDER — SODIUM CHLORIDE 0.9 % IV SOLN
Freq: Once | INTRAVENOUS | Status: DC
Start: 2023-04-04 — End: 2023-04-04
  Filled 2023-04-04: qty 250

## 2023-04-04 MED ORDER — SODIUM CHLORIDE 0.9 % IV SOLN
2400.0000 mg/m2 | INTRAVENOUS | Status: DC
  Administered 2023-04-04: 5000 mg via INTRAVENOUS
  Filled 2023-04-04: qty 100

## 2023-04-04 MED ORDER — SODIUM CHLORIDE 0.9 % IV SOLN
Freq: Once | INTRAVENOUS | Status: AC
  Filled 2023-04-04: qty 250

## 2023-04-04 MED ORDER — DEXAMETHASONE SODIUM PHOSPHATE 10 MG/ML IJ SOLN
10.0000 mg | Freq: Once | INTRAMUSCULAR | Status: AC
  Administered 2023-04-04: 10 mg via INTRAVENOUS
  Filled 2023-04-04: qty 1

## 2023-04-04 MED ORDER — ALTEPLASE 2 MG IJ SOLR
2.0000 mg | Freq: Once | INTRAMUSCULAR | Status: AC
  Administered 2023-04-04: 2 mg
  Filled 2023-04-04: qty 2

## 2023-04-04 NOTE — Patient Instructions (Signed)
[facility] - A DEPT OF [facility]  Discharge Instructions: Thank you for choosing [facility] to provide your oncology and hematology care.  If you have a lab appointment with the Cancer Center, please go directly to the Cancer Center and check in at the registration area.  Wear comfortable clothing and clothing appropriate for easy access to any Portacath or PICC line.   We strive to give you quality time with your provider. You may need to reschedule your appointment if you arrive late (15 or more minutes).  Arriving late affects you and other patients whose appointments are after yours.  Also, if you miss three or more appointments without notifying the office, you may be dismissed from the clinic at the provider's discretion.      For prescription refill requests, have your pharmacy contact our office and allow 72 hours for refills to be completed.    Today you received the following chemotherapy and/or immunotherapy agents- bevacizumab, irinotecan, leucovorin, 5FU      To help prevent nausea and vomiting after your treatment, we encourage you to take your nausea medication as directed.  BELOW ARE SYMPTOMS THAT SHOULD BE REPORTED IMMEDIATELY: *FEVER GREATER THAN 100.4 F (38 C) OR HIGHER *CHILLS OR SWEATING *NAUSEA AND VOMITING THAT IS NOT CONTROLLED WITH YOUR NAUSEA MEDICATION *UNUSUAL SHORTNESS OF BREATH *UNUSUAL BRUISING OR BLEEDING *URINARY PROBLEMS (pain or burning when urinating, or frequent urination) *BOWEL PROBLEMS (unusual diarrhea, constipation, pain near the anus) TENDERNESS IN MOUTH AND THROAT WITH OR WITHOUT PRESENCE OF ULCERS (sore throat, sores in mouth, or a toothache) UNUSUAL RASH, SWELLING OR PAIN  UNUSUAL VAGINAL DISCHARGE OR ITCHING   Items with * indicate a potential emergency and should be followed up as soon as possible or go to the Emergency Department if any problems should occur.  Please show the  CHEMOTHERAPY ALERT CARD or IMMUNOTHERAPY ALERT CARD at check-in to the Emergency Department and triage nurse.  Should you have questions after your visit or need to cancel or reschedule your appointment, please contact [facility] - A DEPT OF [facility]  [phone] and follow the prompts.  Office hours are 8:00 a.m. to 4:30 p.m. Monday - Friday. Please note that voicemails left after 4:00 p.m. may not be returned until the following business day.  We are closed weekends and major holidays. You have access to a nurse at all times for urgent questions. Please call the main number to the clinic [phone] and follow the prompts.  For any non-urgent questions, you may also contact your provider using MyChart. We now offer e-Visits for anyone 30 and older to request care online for non-urgent symptoms. For details visit [email or web address].   Also download the MyChart app! Go to the app store, search "MyChart", open the app, select [place], and log in with your MyChart username and password.

## 2023-04-04 NOTE — Progress Notes (Signed)
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  He was found to have a hemoglobin of 4.5/18.7 on admission.  He received blood transfusion as well as IV iron and his hemoglobin is presently up to an 8.  He underwent colonoscopy which showed malignant partially obstructing tumor that was circumferential 80 cm proximal to the anus.  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  CT chest abdomen and pelvis with contrast did not show any evidence of distant metastatic disease.  2 small sclerotic densities in the acetabulum and proximal right femur likely benign process.  Pathologically enlarged lymph nodes superior to circumferential colonic lesion suggesting metastatic adenopathy.   Patient underwent right hemicolectomy with Dr. Everlene Farrier on 07/12/2021.  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  Vermiform appendix negative for tumor.  Radial margin  positive for tumor (2 lymph nodes at margin with adenocarcinoma).  Proximal and distal margins negative.  7 out of 56 lymph nodes positive for metastatic adenocarcinoma.  Tumor deposits not applicable.  No lymphovascular or perineural invasion seen.  PT4APN2B.  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  BRAF testing was negative.  Genetic testing was offered to the patient but he declined   Fertility conservation was also discussed an option of sperm banking at [facility] was discussed with the patient and his healthcare power of attorney [name].  There would be a one-time cost involved for sperm banking and a yearly cost for preserving the specimen.  Overall chances of infertility with FOLFOX is low but possible.  Patient and his healthcare power of attorney have discussed their options and have decided not to proceed with fertility conservation at this time   Patient completed 12 cycles of adjuvant FOLFOX chemotherapy in followed by adjuvant radiation therapy in September 2023    CT scan and PET scan showed concern for metastatic disease.  18 x 16 mm left lower lobe lung nodule with an SUV of 2.4.  Progressive abdominal/retroperitoneal adenopathy with lymph nodes measuring between 6 mm to 18 mm which were PET avid.  Lung biopsy was negative for malignancy.  Retroperitoneal adenopathy was biopsied and was consistent with mucinous adenocarcinoma of colon primary.  Tissue Tempus testing ATM, BRCA2, PIK3CA mutation.  MLH1 mutation. No evidence of K-ras BRAF or NRAS mutation.  Tumor mutational burden high 95th percentile MSI status could not be assessed because the tumor content is below 30%      Interval history-tolerating chemotherapy well so far without any significant side effects.  Has mild self-limited  diarrhea.  Denies any nausea vomiting or abdominal pain  ECOG PS- 0 Pain scale- 0 Opioid associated constipation- no  Review of  systems- Review of Systems  Constitutional:  Negative for chills, fever, malaise/fatigue and weight loss.  HENT:  Negative for congestion, ear discharge and nosebleeds.   Eyes:  Negative for blurred vision.  Respiratory:  Negative for cough, hemoptysis, sputum production, shortness of breath and wheezing.   Cardiovascular:  Negative for chest pain, palpitations, orthopnea and claudication.  Gastrointestinal:  Negative for abdominal pain, blood in stool, constipation, diarrhea, heartburn, melena, nausea and vomiting.  Genitourinary:  Negative for dysuria, flank pain, frequency, hematuria and urgency.  Musculoskeletal:  Negative for back pain, joint pain and myalgias.  Skin:  Negative for rash.  Neurological:  Negative for dizziness, tingling, focal weakness, seizures, weakness and headaches.  Endo/Heme/Allergies:  Does not bruise/bleed easily.  Psychiatric/Behavioral:  Negative for depression and suicidal ideas. The patient does not have insomnia.       Allergies  Allergen Reactions   Geodon [Ziprasidone Hcl] Anaphylaxis     Past Medical History:  Diagnosis Date   Bipolar 1 disorder (HCC)    Colon cancer (HCC)    Lung nodule 09/2022   PTSD (post-traumatic stress disorder)      Past Surgical History:  Procedure Laterality Date   BRONCHIAL NEEDLE ASPIRATION BIOPSY  10/12/2022   Procedure: BRONCHIAL NEEDLE ASPIRATION BIOPSIES;  Surgeon: Raechel Chute, MD;  Location: [facility] ENDOSCOPY;  Service: Pulmonary;;   COLON RESECTION N/A 07/12/2021   Procedure: HAND ASSISTED LAPAROSCOPIC COLON RESECTION;  Surgeon: Leafy Ro, MD;  Location: [facility] ORS;  Service: General;  Laterality: N/A;   COLONOSCOPY WITH PROPOFOL N/A 07/06/2021   Procedure: COLONOSCOPY WITH PROPOFOL;  Surgeon: Toney Reil, MD;  Location: [facility] ENDOSCOPY;  Service: Gastroenterology;  Laterality: N/A;   COLONOSCOPY WITH PROPOFOL N/A 07/07/2021   Procedure: COLONOSCOPY WITH PROPOFOL;  Surgeon: Toney Reil, MD;   Location: [facility] ENDOSCOPY;  Service: Gastroenterology;  Laterality: N/A;   COLONOSCOPY WITH PROPOFOL N/A 08/02/2022   Procedure: COLONOSCOPY WITH PROPOFOL;  Surgeon: Toney Reil, MD;  Location: [facility] ENDOSCOPY;  Service: Gastroenterology;  Laterality: N/AEarvin Hansen (Caregiver & Transporter) [phone] [name] [phone] will be available for phone consent   ESOPHAGOGASTRODUODENOSCOPY (EGD) WITH PROPOFOL N/A 07/06/2021   Procedure: ESOPHAGOGASTRODUODENOSCOPY (EGD) WITH PROPOFOL;  Surgeon: Toney Reil, MD;  Location: [facility] ENDOSCOPY;  Service: Gastroenterology;  Laterality: N/A;   GIVENS CAPSULE STUDY N/A 07/06/2021   Procedure: GIVENS CAPSULE STUDY;  Surgeon: Toney Reil, MD;  Location: [facility] ENDOSCOPY;  Service: Gastroenterology;  Laterality: N/A;   IR IMAGING GUIDED PORT INSERTION  01/19/2023   IR REMOVAL TUN ACCESS W/ PORT W/O FL MOD SED  02/15/2022   PORTACATH PLACEMENT Right 07/22/2021   Procedure: INSERTION PORT-A-CATH;  Surgeon: Leafy Ro, MD;  Location: [facility] ORS;  Service: General;  Laterality: Right;    Social History   Socioeconomic History   Marital status: Single    Spouse name: Not on file   Number of children: Not on file   Years of education: Not on file   Highest education level: Not on file  Occupational History   Not on file  Tobacco Use   Smoking status: Every Day    Current packs/day: 0.25    Types: Cigarettes   Smokeless tobacco: Current   Tobacco comments:    4 cigarettes a day - khj 09/28/2022  Vaping Use   Vaping status: Every Day  Devices: elfbar  Substance and Sexual Activity   Alcohol use: Not Currently   Drug use: Not Currently   Sexual activity: Not Currently  Other Topics Concern   Not on file  Social History Narrative   Not on file   Social Drivers of Health   Financial Resource Strain: Not on file  Food Insecurity: Not on file  Transportation Needs: Not on file  Physical Activity: Not on file  Stress:  Not on file  Social Connections: Not on file  Intimate Partner Violence: Not on file    Family History  Problem Relation Age of Onset   Kidney cancer Father    Breast cancer Maternal Aunt      Current Outpatient Medications:    acetaminophen (TYLENOL) 325 MG tablet, Take 2 tablets (650 mg total) by mouth every 6 (six) hours as needed for mild pain or moderate pain., Disp: 30 tablet, Rfl: 0   ARIPiprazole (ABILIFY) 10 MG tablet, Take 10 mg by mouth in the morning., Disp: , Rfl:    ARIPiprazole ER (ABILIFY MAINTENA) 400 MG PRSY prefilled syringe, Inject 400 mg into the muscle every 30 (thirty) days., Disp: , Rfl:    benztropine (COGENTIN) 2 MG tablet, Take 2 mg by mouth at bedtime., Disp: , Rfl:    busPIRone (BUSPAR) 10 MG tablet, Take 10 mg by mouth 2 (two) times daily., Disp: , Rfl:    dexamethasone (DECADRON) 4 MG tablet, Take 2 tablets (8 mg total) by mouth daily. Start the day after chemotherapy for 2 days. Take with food., Disp: 8 tablet, Rfl: 5   FLUoxetine (PROZAC) 20 MG capsule, Take 20 mg by mouth daily., Disp: , Rfl:    ibuprofen (ADVIL) 600 MG tablet, Take 600 mg by mouth every 6 (six) hours as needed for moderate pain., Disp: , Rfl:    lidocaine-prilocaine (EMLA) cream, Apply 1 Application topically See admin instructions. Apply to affected area once as directed 1 hour before the pt. Comes to get chemo, Disp: 30 g, Rfl: 2   loperamide (IMODIUM) 2 MG capsule, Take 2 tabs by mouth with first loose stool, then 1 tab with each additional loose stool as needed. Do not exceed 8 tabs in a 24-hour period, Disp: 60 capsule, Rfl: 0   Melatonin 10 MG CAPS, Take 10 mg by mouth at bedtime., Disp: , Rfl:    ondansetron (ZOFRAN) 8 MG tablet, Take 8 mg by mouth 2 (two) times daily as needed for nausea or vomiting., Disp: , Rfl:    ondansetron (ZOFRAN) 8 MG tablet, Take 1 tablet (8 mg total) by mouth every 8 (eight) hours as needed for nausea, vomiting or refractory nausea / vomiting. Start on the  third day after chemotherapy., Disp: 30 tablet, Rfl: 1   oxyCODONE (OXY IR/ROXICODONE) 5 MG immediate release tablet, Take 5 mg by mouth every 6 (six) hours as needed for severe pain or breakthrough pain. (Patient not taking: Reported on 03/21/2023), Disp: , Rfl:    pantoprazole (PROTONIX) 20 MG tablet, Take 1 tablet (20 mg total) by mouth daily., Disp: 30 tablet, Rfl: 3   polyethylene glycol (MIRALAX / GLYCOLAX) 17 g packet, Take 17 g by mouth daily as needed (constipation)., Disp: 14 each, Rfl: 0   prazosin (MINIPRESS) 2 MG capsule, Take 2 mg by mouth 2 (two) times daily., Disp: , Rfl:    prochlorperazine (COMPAZINE) 10 MG tablet, Take 10 mg by mouth every 6 (six) hours as needed for nausea or vomiting., Disp: , Rfl:  Physical exam:  Vitals:   04/04/23 1058  BP: 111/76  Pulse: 85  Resp: 18  Temp: 97.8 F (36.6 C)  TempSrc: Tympanic  SpO2: 100%   Physical Exam Cardiovascular:     Rate and Rhythm: Normal rate and regular rhythm.     Heart sounds: Normal heart sounds.  Pulmonary:     Effort: Pulmonary effort is normal.     Breath sounds: Normal breath sounds.  Skin:    General: Skin is warm and dry.  Neurological:     Mental Status: He is alert and oriented to person, place, and time.         Latest Ref Rng & Units 03/21/2023   10:08 AM  CMP  Glucose 70 - 99 mg/dL 161   BUN 6 - 20 mg/dL 14   Creatinine 0.96 - 1.24 mg/dL 0.45   Sodium 409 - 811 mmol/L 136   Potassium 3.5 - 5.1 mmol/L 4.5   Chloride 98 - 111 mmol/L 101   CO2 22 - 32 mmol/L 27   Calcium 8.9 - 10.3 mg/dL 9.2   Total Protein 6.5 - 8.1 g/dL 7.1   Total Bilirubin <9.1 mg/dL 0.3   Alkaline Phos 38 - 126 U/L 117   AST 15 - 41 U/L 19   ALT 0 - 44 U/L 19       Latest Ref Rng & Units 04/04/2023   10:43 AM  CBC  WBC 4.0 - 10.5 K/uL 6.1   Hemoglobin 13.0 - 17.0 g/dL 47.8   Hematocrit 29.5 - 52.0 % 39.1   Platelets 150 - 400 K/uL 217      Assessment and plan- Patient is a 37 y.o. male h/o stage III colon  cancer now with metastatic disease in the lungs and retroperitoneal adenopathy.  He is here for on treatment assessment prior to cycle 6 of palliative FOLFIRI Avastin chemotherapy  Counts okay to proceed with cycle 6 of palliative FOLFIRI Avastin chemotherapy today.  I will see him back on 04/27/2023 for cycle 7.  He is getting CT chest abdomen and pelvis with contrast on 04/20/2023.  He will not receive Neulasta with this cycle.  Ideally plan is to continue treatment until progression or toxicity but we could consider giving him a treatment break if he responds well on scans to quality of life along with chemotherapy.  Blood pressure is stable and urine protein remains trace to proceed with a Avastin   Visit Diagnosis 1. Adenocarcinoma of colon (HCC)   2. Encounter for monoclonal antibody treatment for malignancy   3. Encounter for antineoplastic chemotherapy      Dr. Owens Shark, MD, MPH [facility] [phone] 04/04/2023 11:29 AM

## 2023-04-05 ENCOUNTER — Other Ambulatory Visit: Payer: Self-pay

## 2023-04-06 ENCOUNTER — Inpatient Hospital Stay: Payer: MEDICAID

## 2023-04-06 VITALS — BP 137/88 | HR 86 | Temp 98.0°F | Resp 18

## 2023-04-06 DIAGNOSIS — Z5111 Encounter for antineoplastic chemotherapy: Secondary | ICD-10-CM | POA: Diagnosis not present

## 2023-04-06 DIAGNOSIS — C189 Malignant neoplasm of colon, unspecified: Secondary | ICD-10-CM

## 2023-04-06 MED ORDER — HEPARIN SOD (PORK) LOCK FLUSH 100 UNIT/ML IV SOLN
500.0000 [IU] | Freq: Once | INTRAVENOUS | Status: AC | PRN
Start: 2023-04-06 — End: 2023-04-06
  Administered 2023-04-06: 500 [IU]
  Filled 2023-04-06: qty 5

## 2023-04-06 MED ORDER — SODIUM CHLORIDE 0.9% FLUSH
10.0000 mL | INTRAVENOUS | Status: DC | PRN
Start: 2023-04-06 — End: 2023-04-06
  Administered 2023-04-06: 10 mL
  Filled 2023-04-06: qty 10

## 2023-04-19 ENCOUNTER — Encounter: Payer: Self-pay | Admitting: Oncology

## 2023-04-19 NOTE — Telephone Encounter (Signed)
 Error

## 2023-04-20 ENCOUNTER — Ambulatory Visit
Admission: RE | Admit: 2023-04-20 | Discharge: 2023-04-20 | Disposition: A | Discharge status: Home / Self Care | Payer: MEDICAID | Source: Ambulatory Visit | Attending: Oncology | Admitting: Oncology

## 2023-04-20 ENCOUNTER — Encounter: Payer: Self-pay | Admitting: Oncology

## 2023-04-20 DIAGNOSIS — C189 Malignant neoplasm of colon, unspecified: Secondary | ICD-10-CM | POA: Insufficient documentation

## 2023-04-20 MED ORDER — IOHEXOL 300 MG/ML  SOLN
100.0000 mL | Freq: Once | INTRAMUSCULAR | Status: AC | PRN
Start: 2023-04-20 — End: 2023-04-20
  Administered 2023-04-20: 100 mL via INTRAVENOUS

## 2023-04-27 ENCOUNTER — Inpatient Hospital Stay: Payer: MEDICAID | Attending: Oncology

## 2023-04-27 ENCOUNTER — Inpatient Hospital Stay (HOSPITAL_BASED_OUTPATIENT_CLINIC_OR_DEPARTMENT_OTHER): Payer: MEDICAID | Admitting: Oncology

## 2023-04-27 ENCOUNTER — Inpatient Hospital Stay: Payer: MEDICAID

## 2023-04-27 ENCOUNTER — Encounter: Payer: Self-pay | Admitting: Oncology

## 2023-04-27 VITALS — BP 146/78 | HR 76

## 2023-04-27 VITALS — BP 121/77 | HR 97 | Temp 97.6°F | Resp 18 | Wt 203.8 lb

## 2023-04-27 DIAGNOSIS — Z5111 Encounter for antineoplastic chemotherapy: Secondary | ICD-10-CM | POA: Insufficient documentation

## 2023-04-27 DIAGNOSIS — C184 Malignant neoplasm of transverse colon: Secondary | ICD-10-CM | POA: Diagnosis not present

## 2023-04-27 DIAGNOSIS — C189 Malignant neoplasm of colon, unspecified: Secondary | ICD-10-CM | POA: Insufficient documentation

## 2023-04-27 DIAGNOSIS — Z803 Family history of malignant neoplasm of breast: Secondary | ICD-10-CM | POA: Insufficient documentation

## 2023-04-27 DIAGNOSIS — C7802 Secondary malignant neoplasm of left lung: Secondary | ICD-10-CM | POA: Insufficient documentation

## 2023-04-27 DIAGNOSIS — C786 Secondary malignant neoplasm of retroperitoneum and peritoneum: Secondary | ICD-10-CM | POA: Insufficient documentation

## 2023-04-27 DIAGNOSIS — Z9221 Personal history of antineoplastic chemotherapy: Secondary | ICD-10-CM | POA: Insufficient documentation

## 2023-04-27 DIAGNOSIS — R918 Other nonspecific abnormal finding of lung field: Secondary | ICD-10-CM | POA: Insufficient documentation

## 2023-04-27 DIAGNOSIS — Z923 Personal history of irradiation: Secondary | ICD-10-CM | POA: Diagnosis not present

## 2023-04-27 DIAGNOSIS — C7801 Secondary malignant neoplasm of right lung: Secondary | ICD-10-CM | POA: Diagnosis not present

## 2023-04-27 DIAGNOSIS — Z5112 Encounter for antineoplastic immunotherapy: Secondary | ICD-10-CM

## 2023-04-27 DIAGNOSIS — F17210 Nicotine dependence, cigarettes, uncomplicated: Secondary | ICD-10-CM | POA: Diagnosis not present

## 2023-04-27 DIAGNOSIS — Z8051 Family history of malignant neoplasm of kidney: Secondary | ICD-10-CM | POA: Diagnosis not present

## 2023-04-27 LAB — CBC WITH DIFFERENTIAL (CANCER CENTER ONLY)
Abs Immature Granulocytes: 0.09 10*3/uL — ABNORMAL HIGH (ref 0.00–0.07)
Basophils Absolute: 0.1 10*3/uL (ref 0.0–0.1)
Basophils Relative: 1 %
Eosinophils Absolute: 0.1 10*3/uL (ref 0.0–0.5)
Eosinophils Relative: 2 %
HCT: 41.7 % (ref 39.0–52.0)
Hemoglobin: 13.7 g/dL (ref 13.0–17.0)
Immature Granulocytes: 1 %
Lymphocytes Relative: 22 %
Lymphs Abs: 1.7 10*3/uL (ref 0.7–4.0)
MCH: 31.1 pg (ref 26.0–34.0)
MCHC: 32.9 g/dL (ref 30.0–36.0)
MCV: 94.8 fL (ref 80.0–100.0)
Monocytes Absolute: 0.7 10*3/uL (ref 0.1–1.0)
Monocytes Relative: 9 %
Neutro Abs: 4.9 10*3/uL (ref 1.7–7.7)
Neutrophils Relative %: 65 %
Platelet Count: 220 10*3/uL (ref 150–400)
RBC: 4.4 MIL/uL (ref 4.22–5.81)
RDW: 13.9 % (ref 11.5–15.5)
WBC Count: 7.5 10*3/uL (ref 4.0–10.5)
nRBC: 0 % (ref 0.0–0.2)

## 2023-04-27 LAB — CMP (CANCER CENTER ONLY)
ALT: 20 U/L (ref 0–44)
AST: 25 U/L (ref 15–41)
Albumin: 4 g/dL (ref 3.5–5.0)
Alkaline Phosphatase: 71 U/L (ref 38–126)
Anion gap: 9 (ref 5–15)
BUN: 16 mg/dL (ref 6–20)
CO2: 24 mmol/L (ref 22–32)
Calcium: 9.4 mg/dL (ref 8.9–10.3)
Chloride: 104 mmol/L (ref 98–111)
Creatinine: 1.06 mg/dL (ref 0.61–1.24)
GFR, Estimated: >60 mL/min (ref >60)
Glucose, Bld: 110 mg/dL — ABNORMAL HIGH (ref 70–99)
Potassium: 4.3 mmol/L (ref 3.5–5.1)
Sodium: 137 mmol/L (ref 135–145)
Total Bilirubin: 0.5 mg/dL (ref 0.0–1.2)
Total Protein: 7.3 g/dL (ref 6.5–8.1)

## 2023-04-27 LAB — TOTAL PROTEIN, URINE DIPSTICK: Protein, ur: NEGATIVE mg/dL

## 2023-04-27 MED ORDER — DEXAMETHASONE SODIUM PHOSPHATE 10 MG/ML IJ SOLN
10.0000 mg | Freq: Once | INTRAMUSCULAR | Status: AC
  Administered 2023-04-27: 10 mg via INTRAVENOUS
  Filled 2023-04-27: qty 1

## 2023-04-27 MED ORDER — PALONOSETRON HCL INJECTION 0.25 MG/5ML
0.2500 mg | Freq: Once | INTRAVENOUS | Status: AC
  Administered 2023-04-27: 0.25 mg via INTRAVENOUS
  Filled 2023-04-27: qty 5

## 2023-04-27 MED ORDER — SODIUM CHLORIDE 0.9 % IV SOLN
2400.0000 mg/m2 | INTRAVENOUS | Status: DC
  Administered 2023-04-27: 5000 mg via INTRAVENOUS
  Filled 2023-04-27: qty 100

## 2023-04-27 MED ORDER — SODIUM CHLORIDE 0.9 % IV SOLN
Freq: Once | INTRAVENOUS | Status: AC
  Filled 2023-04-27: qty 250

## 2023-04-27 MED ORDER — SODIUM CHLORIDE 0.9 % IV SOLN
150.0000 mg/m2 | Freq: Once | INTRAVENOUS | Status: AC
  Administered 2023-04-27: 320 mg via INTRAVENOUS
  Filled 2023-04-27: qty 15

## 2023-04-27 MED ORDER — FLUOROURACIL CHEMO INJECTION 2.5 GM/50ML
400.0000 mg/m2 | Freq: Once | INTRAVENOUS | Status: AC
  Administered 2023-04-27: 850 mg via INTRAVENOUS
  Filled 2023-04-27: qty 17

## 2023-04-27 MED ORDER — SODIUM CHLORIDE 0.9 % IV SOLN
5.0000 mg/kg | Freq: Once | INTRAVENOUS | Status: AC
  Administered 2023-04-27: 500 mg via INTRAVENOUS
  Filled 2023-04-27: qty 16

## 2023-04-27 MED ORDER — SODIUM CHLORIDE 0.9 % IV SOLN
400.0000 mg/m2 | Freq: Once | INTRAVENOUS | Status: AC
  Administered 2023-04-27: 856 mg via INTRAVENOUS
  Filled 2023-04-27: qty 42.8

## 2023-04-27 NOTE — Progress Notes (Signed)
 Hematology/Oncology Consult note [facility]  Telephone:([phone] [phone]  Patient Care Team: Weyman Bright, MD as PCP - General (Internal Medicine) Melanee Annah BROCKS, MD as Consulting Physician (Hematology and Oncology) Jordis Laneta FALCON, MD as Consulting Physician (General Surgery) Unk Corinn Skiff, MD as Consulting Physician (Gastroenterology) Maurie Rayfield BIRCH, RN as Oncology Nurse Navigator   Name of the patient: Connor Wilkinson  [id number]  Jul 24, 1985   Date of visit: 04/27/23  Diagnosis-  history of stage III colon cancer now with recurrent metastatic disease   Chief complaint/ Reason for visit-on treatment assessment prior to cycle 7 of palliative FOLFIRI Avastin  chemotherapy  Heme/Onc history:  patient is a 38 year old male with a past medical history significant for mild bipolar disorder who is a ward of the stateAnd was admitted to the hospital for severe anemia.  He was found to have a hemoglobin of 4.5/18.7 on admission.  He received blood transfusion as well as IV iron  and his hemoglobin is presently up to an 8.  He underwent colonoscopy which showed malignant partially obstructing tumor that was circumferential 80 cm proximal to the anus.  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  CT chest abdomen and pelvis with contrast did not show any evidence of distant metastatic disease.  2 small sclerotic densities in the acetabulum and proximal right femur likely benign process.  Pathologically enlarged lymph nodes superior to circumferential colonic lesion suggesting metastatic adenopathy.   Patient underwent right hemicolectomy with Dr. Jordis on 07/12/2021.  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  Vermiform appendix negative for tumor.  Radial margin  positive for tumor (2 lymph nodes at margin with adenocarcinoma).  Proximal and distal margins negative.  7 out of 56 lymph nodes positive for metastatic adenocarcinoma.  Tumor deposits not applicable.  No lymphovascular or perineural invasion seen.  PT4APN2B.  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  BRAF testing was negative.  Genetic testing was offered to the patient but he declined   Fertility conservation was also discussed an option of sperm banking at [facility] was discussed with the patient and his healthcare power of attorney [name].  There would be a one-time cost involved for sperm banking and a yearly cost for preserving the specimen.  Overall chances of infertility with FOLFOX is low but possible.  Patient and his healthcare power of attorney have discussed their options and have decided not to proceed with fertility conservation at this time   Patient completed 12 cycles of adjuvant FOLFOX chemotherapy in followed by adjuvant radiation therapy in September 2023    CT scan and PET scan showed concern for metastatic disease.  18 x 16 mm left lower lobe lung nodule with an SUV of 2.4.  Progressive abdominal/retroperitoneal adenopathy with lymph nodes measuring between 6 mm to 18 mm which were PET avid.  Lung biopsy was negative for malignancy.  Retroperitoneal adenopathy was biopsied and was consistent with mucinous adenocarcinoma of colon primary.  Tissue Tempus testing ATM, BRCA2, PIK3CA mutation.  MLH1 mutation. No evidence of K-ras BRAF or NRAS mutation.  Tumor mutational burden high 95th percentile MSI status could not be assessed because the tumor content is below 30%  Interval history-overall tolerating chemotherapy well.  Mild self-limited diarrhea.  Denies any new complaints at this time  ECOG  PS- 0 Pain scale- 0   Review of systems- Review of Systems  Constitutional:  Negative for chills, fever, malaise/fatigue  and weight loss.  HENT:  Negative for congestion, ear discharge and nosebleeds.   Eyes:  Negative for blurred vision.  Respiratory:  Negative for cough, hemoptysis, sputum production, shortness of breath and wheezing.   Cardiovascular:  Negative for chest pain, palpitations, orthopnea and claudication.  Gastrointestinal:  Negative for abdominal pain, blood in stool, constipation, diarrhea, heartburn, melena, nausea and vomiting.  Genitourinary:  Negative for dysuria, flank pain, frequency, hematuria and urgency.  Musculoskeletal:  Negative for back pain, joint pain and myalgias.  Skin:  Negative for rash.  Neurological:  Negative for dizziness, tingling, focal weakness, seizures, weakness and headaches.  Endo/Heme/Allergies:  Does not bruise/bleed easily.  Psychiatric/Behavioral:  Negative for depression and suicidal ideas. The patient does not have insomnia.       Allergies  Allergen Reactions   Geodon [Ziprasidone Hcl] Anaphylaxis     Past Medical History:  Diagnosis Date   Bipolar 1 disorder (HCC)    Colon cancer (HCC)    Lung nodule 09/2022   PTSD (post-traumatic stress disorder)      Past Surgical History:  Procedure Laterality Date   BRONCHIAL NEEDLE ASPIRATION BIOPSY  10/12/2022   Procedure: BRONCHIAL NEEDLE ASPIRATION BIOPSIES;  Surgeon: Isadora Hose, MD;  Location: [facility] ENDOSCOPY;  Service: Pulmonary;;   COLON RESECTION N/A 07/12/2021   Procedure: HAND ASSISTED LAPAROSCOPIC COLON RESECTION;  Surgeon: Jordis Laneta FALCON, MD;  Location: [facility] ORS;  Service: General;  Laterality: N/A;   COLONOSCOPY WITH PROPOFOL  N/A 07/06/2021   Procedure: COLONOSCOPY WITH PROPOFOL ;  Surgeon: Unk Corinn Skiff, MD;  Location: [facility] ENDOSCOPY;  Service: Gastroenterology;  Laterality: N/A;   COLONOSCOPY WITH PROPOFOL  N/A 07/07/2021   Procedure: COLONOSCOPY WITH PROPOFOL ;  Surgeon: Unk Corinn Skiff, MD;  Location: [facility] ENDOSCOPY;  Service: Gastroenterology;  Laterality: N/A;   COLONOSCOPY WITH  PROPOFOL  N/A 08/02/2022   Procedure: COLONOSCOPY WITH PROPOFOL ;  Surgeon: Unk Corinn Skiff, MD;  Location[facility] ENDOSCOPY;  Service: Gastroenterology;  Laterality: N/AMERL Lung (Caregiver & Transporter) [phone] [name] [phone] will be available for phone consent   ESOPHAGOGASTRODUODENOSCOPY (EGD) WITH PROPOFOL  N/A 07/06/2021   Procedure: ESOPHAGOGASTRODUODENOSCOPY (EGD) WITH PROPOFOL ;  Surgeon: Unk Corinn Skiff, MD;  Location: [facility] ENDOSCOPY;  Service: Gastroenterology;  Laterality: N/A;   GIVENS CAPSULE STUDY N/A 07/06/2021   Procedure: GIVENS CAPSULE STUDY;  Surgeon: Unk Corinn Skiff, MD;  Location: [facility] ENDOSCOPY;  Service: Gastroenterology;  Laterality: N/A;   IR IMAGING GUIDED PORT INSERTION  01/19/2023   IR REMOVAL TUN ACCESS W/ PORT W/O FL MOD SED  02/15/2022   PORTACATH PLACEMENT Right 07/22/2021   Procedure: INSERTION PORT-A-CATH;  Surgeon: Jordis Laneta FALCON, MD;  Location: [facility] ORS;  Service: General;  Laterality: Right;    Social History   Socioeconomic History   Marital status: Single    Spouse name: Not on file   Number of children: Not on file   Years of education: Not on file   Highest education level: Not on file  Occupational History   Not on file  Tobacco Use   Smoking status: Every Day    Current packs/day: 0.25    Types: Cigarettes   Smokeless tobacco: Current   Tobacco comments:    4 cigarettes a day - khj 09/28/2022  Vaping Use   Vaping status: Every Day   Devices: elfbar  Substance and Sexual Activity   Alcohol use: Not Currently  Drug use: Not Currently   Sexual activity: Not Currently  Other Topics Concern   Not on file  Social History Narrative   Not on file   Social Drivers of Health   Financial Resource Strain: Not on file  Food Insecurity: Not on file  Transportation Needs: Not on file  Physical Activity: Not on file  Stress: Not on file  Social Connections: Not on file  Intimate Partner Violence: Not on file     Family History  Problem Relation Age of Onset   Kidney cancer Father    Breast cancer Maternal Aunt      Current Outpatient Medications:    acetaminophen  (TYLENOL ) 325 MG tablet, Take 2 tablets (650 mg total) by mouth every 6 (six) hours as needed for mild pain or moderate pain., Disp: 30 tablet, Rfl: 0   ARIPiprazole  (ABILIFY ) 10 MG tablet, Take 10 mg by mouth in the morning., Disp: , Rfl:    ARIPiprazole  ER (ABILIFY  MAINTENA) 400 MG PRSY prefilled syringe, Inject 400 mg into the muscle every 30 (thirty) days., Disp: , Rfl:    benztropine  (COGENTIN ) 2 MG tablet, Take 2 mg by mouth at bedtime., Disp: , Rfl:    busPIRone  (BUSPAR ) 10 MG tablet, Take 10 mg by mouth 2 (two) times daily., Disp: , Rfl:    dexamethasone  (DECADRON ) 4 MG tablet, Take 2 tablets (8 mg total) by mouth daily. Start the day after chemotherapy for 2 days. Take with food., Disp: 8 tablet, Rfl: 5   FLUoxetine  (PROZAC ) 20 MG capsule, Take 20 mg by mouth daily., Disp: , Rfl:    ibuprofen  (ADVIL ) 600 MG tablet, Take 600 mg by mouth every 6 (six) hours as needed for moderate pain., Disp: , Rfl:    lidocaine -prilocaine  (EMLA ) cream, Apply 1 Application topically See admin instructions. Apply to affected area once as directed 1 hour before the pt. Comes to get chemo, Disp: 30 g, Rfl: 2   loperamide  (IMODIUM ) 2 MG capsule, Take 2 tabs by mouth with first loose stool, then 1 tab with each additional loose stool as needed. Do not exceed 8 tabs in a 24-hour period, Disp: 60 capsule, Rfl: 0   Melatonin 10 MG CAPS, Take 10 mg by mouth at bedtime., Disp: , Rfl:    ondansetron  (ZOFRAN ) 8 MG tablet, Take 8 mg by mouth 2 (two) times daily as needed for nausea or vomiting., Disp: , Rfl:    ondansetron  (ZOFRAN ) 8 MG tablet, Take 1 tablet (8 mg total) by mouth every 8 (eight) hours as needed for nausea, vomiting or refractory nausea / vomiting. Start on the third day after chemotherapy., Disp: 30 tablet, Rfl: 1   oxyCODONE  (OXY  IR/ROXICODONE ) 5 MG immediate release tablet, Take 5 mg by mouth every 6 (six) hours as needed for severe pain or breakthrough pain. (Patient not taking: Reported on 03/21/2023), Disp: , Rfl:    pantoprazole  (PROTONIX ) 20 MG tablet, Take 1 tablet (20 mg total) by mouth daily., Disp: 30 tablet, Rfl: 3   polyethylene glycol (MIRALAX  / GLYCOLAX ) 17 g packet, Take 17 g by mouth daily as needed (constipation)., Disp: 14 each, Rfl: 0   prazosin  (MINIPRESS ) 2 MG capsule, Take 2 mg by mouth 2 (two) times daily., Disp: , Rfl:    prochlorperazine  (COMPAZINE ) 10 MG tablet, Take 10 mg by mouth every 6 (six) hours as needed for nausea or vomiting., Disp: , Rfl:  No current facility-administered medications for this visit.  Facility-Administered Medications Ordered in Other Visits:  fluorouracil  (ADRUCIL ) 5,000 mg in sodium chloride  0.9 % 150 mL chemo infusion, 2,400 mg/m2 (Treatment Plan Recorded), Intravenous, 1 day or 1 dose, Melanee Annah BROCKS, MD   fluorouracil  (ADRUCIL ) chemo injection 850 mg, 400 mg/m2 (Treatment Plan Recorded), Intravenous, Once, Melanee Annah BROCKS, MD   irinotecan  (CAMPTOSAR ) 320 mg in sodium chloride  0.9 % 500 mL chemo infusion, 150 mg/m2 (Treatment Plan Recorded), Intravenous, Once, Melanee Annah BROCKS, MD, Last Rate: 344 mL/hr at 04/27/23 1146, 320 mg at 04/27/23 1146   leucovorin  856 mg in sodium chloride  0.9 % 250 mL infusion, 400 mg/m2 (Treatment Plan Recorded), Intravenous, Once, Melanee Annah BROCKS, MD, Last Rate: 195 mL/hr at 04/27/23 1144, 856 mg at 04/27/23 1144  Physical exam:  Vitals:   04/27/23 0911  BP: 121/77  Pulse: 97  Resp: 18  Temp: 97.6 F (36.4 C)  TempSrc: Tympanic  SpO2: 100%  Weight: 203 lb 12.8 oz (92.4 kg)   Physical Exam Cardiovascular:     Rate and Rhythm: Normal rate and regular rhythm.     Heart sounds: Normal heart sounds.  Pulmonary:     Effort: Pulmonary effort is normal.     Breath sounds: Normal breath sounds.  Skin:    General: Skin is warm and dry.   Neurological:     Mental Status: He is alert and oriented to person, place, and time.         Latest Ref Rng & Units 04/27/2023    8:51 AM  CMP  Glucose 70 - 99 mg/dL 889   BUN 6 - 20 mg/dL 16   Creatinine 9.38 - 1.24 mg/dL 8.93   Sodium 864 - 854 mmol/L 137   Potassium 3.5 - 5.1 mmol/L 4.3   Chloride 98 - 111 mmol/L 104   CO2 22 - 32 mmol/L 24   Calcium  8.9 - 10.3 mg/dL 9.4   Total Protein 6.5 - 8.1 g/dL 7.3   Total Bilirubin 0.0 - 1.2 mg/dL 0.5   Alkaline Phos 38 - 126 U/L 71   AST 15 - 41 U/L 25   ALT 0 - 44 U/L 20       Latest Ref Rng & Units 04/27/2023    8:51 AM  CBC  WBC 4.0 - 10.5 K/uL 7.5   Hemoglobin 13.0 - 17.0 g/dL 86.2   Hematocrit 60.9 - 52.0 % 41.7   Platelets 150 - 400 K/uL 220     No images are attached to the encounter.  CT CHEST ABDOMEN PELVIS W CONTRAST Result Date: 04/20/2023 CLINICAL DATA:  Metastatic colon cancer restaging, assess treatment response * Tracking Code: BO * EXAM: CT CHEST, ABDOMEN, AND PELVIS WITH CONTRAST TECHNIQUE: Multidetector CT imaging of the chest, abdomen and pelvis was performed following the standard protocol during bolus administration of intravenous contrast. RADIATION DOSE REDUCTION: This exam was performed according to the departmental dose-optimization program which includes automated exposure control, adjustment of the mA and/or kV according to patient size and/or use of iterative reconstruction technique. CONTRAST:  OMNIPAQUE  IOHEXOL  300 MG/ML SOLN additional oral enteric contrast COMPARISON:  11/27/2022 FINDINGS: CT CHEST FINDINGS Cardiovascular: Right chest port catheter. Normal heart size. No pericardial effusion. Mediastinum/Nodes: No enlarged mediastinal, hilar, or axillary lymph nodes. Thyroid gland, trachea, and esophagus demonstrate no significant findings. Lungs/Pleura: Enlargement of a left lower lobe pulmonary nodule, measuring 2.3 x 2.0 cm, previously 1.8 x 1.6 cm (series 4, image 117). Interval enlargement of  a nodule of the medial left lower lobe measuring 1.2 x 0.9 cm,  previously 0.7 cm (series 4, image 115). No pleural effusion or pneumothorax. Musculoskeletal: No chest wall abnormality. No acute osseous findings. CT ABDOMEN PELVIS FINDINGS Hepatobiliary: No solid liver abnormality is seen. No gallstones, gallbladder wall thickening, or biliary dilatation. Pancreas: Unremarkable. No pancreatic ductal dilatation or surrounding inflammatory changes. Spleen: Normal in size without significant abnormality. Adrenals/Urinary Tract: Adrenal glands are unremarkable. Kidneys are normal, without renal calculi, solid lesion, or hydronephrosis. Bladder is unremarkable. Stomach/Bowel: Stomach is within normal limits. Status post right hemicolectomy and reanastomosis. No evidence of bowel wall thickening, distention, or inflammatory changes. Vascular/Lymphatic: No significant vascular findings are present. Unchanged enlarged, partially calcified retroperitoneal lymph nodes, measuring up to 2.8 x 1.7 cm (series 2, image 63). Reproductive: No mass or other abnormality. Other: No abdominal wall hernia or abnormality. No ascites. Musculoskeletal: No acute osseous findings. IMPRESSION: 1. Enlargement of left lower lobe pulmonary nodules consistent with worsened pulmonary metastatic disease. 2. Unchanged enlarged, partially calcified retroperitoneal lymph nodes. 3. Status post right hemicolectomy and reanastomosis. Electronically Signed   By: Marolyn JONETTA Jaksch M.D.   On: 04/20/2023 22:08     Assessment and plan- Patient is a 38 y.o. male  h/o stage III colon cancer now with metastatic disease in the lungs and retroperitoneal adenopathy.  He is here for on treatment assessment prior to cycle 7 of palliative FOLFIRI Avastin  chemotherapy and to discuss CT scans and further management  I have reviewed CT chest abdomen pelvis images independently and discussed findings with the patient.  He was noted to have stable size of the  retroperitoneal lymph nodes.  However 2 lung nodules have mildly increased in size and both of them are in the left lower lobe.  1 of these was biopsied in the past were negative for malignancy he does not have any significant burden of disease elsewhere.  Plan is to continue FOLFIRI Avastin  chemotherapy at this time but referred him to radiation oncology for consideration for SBRT.  To these lung lesions.  He will proceed with cycle 7 of FOLFIRI Avastin  chemotherapy today and I will see him back in 2 weeks for cycle 8.  CEA overall is trending down   Visit Diagnosis 1. Adenocarcinoma of colon (HCC)   2. Encounter for antineoplastic chemotherapy   3. Encounter for monoclonal antibody treatment for malignancy      Dr. Annah Skene, MD, MPH [facility] [phone] 04/27/2023 12:50 PM

## 2023-04-28 ENCOUNTER — Other Ambulatory Visit: Payer: Self-pay

## 2023-04-28 LAB — CEA: CEA: 4.8 ng/mL — ABNORMAL HIGH (ref 0.0–4.7)

## 2023-04-30 ENCOUNTER — Inpatient Hospital Stay: Payer: MEDICAID

## 2023-04-30 ENCOUNTER — Other Ambulatory Visit: Payer: Self-pay | Admitting: Oncology

## 2023-04-30 VITALS — BP 129/88 | HR 94 | Temp 96.6°F | Resp 18

## 2023-04-30 DIAGNOSIS — C189 Malignant neoplasm of colon, unspecified: Secondary | ICD-10-CM

## 2023-04-30 DIAGNOSIS — Z5111 Encounter for antineoplastic chemotherapy: Secondary | ICD-10-CM | POA: Diagnosis not present

## 2023-04-30 MED ORDER — DEXAMETHASONE 4 MG PO TABS: 8.0000 mg | ORAL_TABLET | Freq: Every day | ORAL | 5 refills | Status: DC

## 2023-04-30 MED ORDER — SODIUM CHLORIDE 0.9% FLUSH
10.0000 mL | INTRAVENOUS | Status: DC | PRN
Start: 2023-04-30 — End: 2023-04-30
  Administered 2023-04-30: 10 mL
  Filled 2023-04-30: qty 10

## 2023-04-30 MED ORDER — HEPARIN SOD (PORK) LOCK FLUSH 100 UNIT/ML IV SOLN
500.0000 [IU] | Freq: Once | INTRAVENOUS | Status: AC | PRN
  Administered 2023-04-30: 500 [IU]
  Filled 2023-04-30: qty 5

## 2023-04-30 NOTE — Progress Notes (Signed)
 Pt showed with pump off- skin intact not  red or blistered. Pump on with 14.7cc left, port needle attached and intact. Pt states it came off last night around 8:15 pm.  Dr Melanee made aware- instructed just to leave pump off. Pt given yellow chemo spill bag and reiterated what to do if happens again. Instructed to wash sheets and any surfaces that could have been exposed, verbalized understanding. Port reaccessed , flushed and heparin  locked. GBR noted.

## 2023-05-07 ENCOUNTER — Encounter: Payer: Self-pay | Admitting: Oncology

## 2023-05-08 ENCOUNTER — Ambulatory Visit: Payer: MEDICAID | Admitting: Radiation Oncology

## 2023-05-08 DIAGNOSIS — C184 Malignant neoplasm of transverse colon: Secondary | ICD-10-CM | POA: Insufficient documentation

## 2023-05-08 DIAGNOSIS — C786 Secondary malignant neoplasm of retroperitoneum and peritoneum: Secondary | ICD-10-CM | POA: Insufficient documentation

## 2023-05-08 DIAGNOSIS — Z923 Personal history of irradiation: Secondary | ICD-10-CM | POA: Insufficient documentation

## 2023-05-08 DIAGNOSIS — Z9221 Personal history of antineoplastic chemotherapy: Secondary | ICD-10-CM | POA: Insufficient documentation

## 2023-05-08 DIAGNOSIS — R918 Other nonspecific abnormal finding of lung field: Secondary | ICD-10-CM | POA: Insufficient documentation

## 2023-05-10 ENCOUNTER — Telehealth: Payer: Self-pay | Admitting: *Deleted

## 2023-05-10 NOTE — Telephone Encounter (Signed)
Called patient to reschedule missed appointment rescheduled for 1/29 group home verbalized understanding.

## 2023-05-11 ENCOUNTER — Ambulatory Visit: Payer: MEDICAID

## 2023-05-11 ENCOUNTER — Ambulatory Visit: Payer: MEDICAID | Admitting: Oncology

## 2023-05-11 ENCOUNTER — Telehealth: Payer: Self-pay | Admitting: *Deleted

## 2023-05-11 ENCOUNTER — Other Ambulatory Visit: Payer: MEDICAID

## 2023-05-11 MED ORDER — IBUPROFEN 800 MG PO TABS: 800.0000 mg | ORAL_TABLET | Freq: Three times a day (TID) | ORAL | 0 refills | Status: AC | PRN

## 2023-05-11 NOTE — Telephone Encounter (Signed)
Staff called to see if the pt. Can get refill for tylenol and ibuprofen.will reorder it

## 2023-05-14 ENCOUNTER — Inpatient Hospital Stay: Payer: MEDICAID | Admitting: Oncology

## 2023-05-14 ENCOUNTER — Inpatient Hospital Stay: Payer: MEDICAID

## 2023-05-15 ENCOUNTER — Encounter: Payer: Self-pay | Admitting: Oncology

## 2023-05-15 ENCOUNTER — Telehealth: Payer: Self-pay | Admitting: Oncology

## 2023-05-15 ENCOUNTER — Other Ambulatory Visit: Payer: Self-pay | Admitting: Oncology

## 2023-05-15 NOTE — Telephone Encounter (Signed)
Pt had missed appts and MD updated IS for pt to have appts tomorrow 05/16/23. I spoke with legal guardian about the updated appts and did not know that pt had missed any appts. [name] stated that she hasn't talked with anyone from here for a while and did not know his treatment plan had updated.   [name] told me to call [name]- [phone] as he would arrange the transportation for the pt.  I called [name] and he laughed when I told him that [name] told me to call him for the transportation of the pt but said that he would make sure the pt was here tomorrow for his appts.

## 2023-05-16 ENCOUNTER — Inpatient Hospital Stay (HOSPITAL_BASED_OUTPATIENT_CLINIC_OR_DEPARTMENT_OTHER): Payer: MEDICAID | Admitting: Oncology

## 2023-05-16 ENCOUNTER — Encounter: Payer: Self-pay | Admitting: Oncology

## 2023-05-16 ENCOUNTER — Ambulatory Visit
Admission: RE | Admit: 2023-05-16 | Discharge: 2023-05-16 | Disposition: A | Discharge status: Home / Self Care | Payer: MEDICAID | Source: Ambulatory Visit | Attending: Radiation Oncology | Admitting: Radiation Oncology

## 2023-05-16 ENCOUNTER — Inpatient Hospital Stay: Payer: MEDICAID

## 2023-05-16 VITALS — BP 130/88 | HR 87 | Temp 96.2°F | Resp 17 | Wt 203.0 lb

## 2023-05-16 DIAGNOSIS — Z5112 Encounter for antineoplastic immunotherapy: Secondary | ICD-10-CM

## 2023-05-16 DIAGNOSIS — C189 Malignant neoplasm of colon, unspecified: Secondary | ICD-10-CM | POA: Diagnosis not present

## 2023-05-16 DIAGNOSIS — R911 Solitary pulmonary nodule: Secondary | ICD-10-CM

## 2023-05-16 DIAGNOSIS — Z5111 Encounter for antineoplastic chemotherapy: Secondary | ICD-10-CM

## 2023-05-16 LAB — CBC WITH DIFFERENTIAL (CANCER CENTER ONLY)
Abs Immature Granulocytes: 0.01 10*3/uL (ref 0.00–0.07)
Basophils Absolute: 0 10*3/uL (ref 0.0–0.1)
Basophils Relative: 0 %
Eosinophils Absolute: 0.2 10*3/uL (ref 0.0–0.5)
Eosinophils Relative: 3 %
HCT: 41.3 % (ref 39.0–52.0)
Hemoglobin: 13.5 g/dL (ref 13.0–17.0)
Immature Granulocytes: 0 %
Lymphocytes Relative: 23 %
Lymphs Abs: 1.1 10*3/uL (ref 0.7–4.0)
MCH: 31 pg (ref 26.0–34.0)
MCHC: 32.7 g/dL (ref 30.0–36.0)
MCV: 94.9 fL (ref 80.0–100.0)
Monocytes Absolute: 0.6 10*3/uL (ref 0.1–1.0)
Monocytes Relative: 12 %
Neutro Abs: 2.9 10*3/uL (ref 1.7–7.7)
Neutrophils Relative %: 62 %
Platelet Count: 248 10*3/uL (ref 150–400)
RBC: 4.35 MIL/uL (ref 4.22–5.81)
RDW: 13.8 % (ref 11.5–15.5)
WBC Count: 4.7 10*3/uL (ref 4.0–10.5)
nRBC: 0 % (ref 0.0–0.2)

## 2023-05-16 LAB — CMP (CANCER CENTER ONLY)
ALT: 17 U/L (ref 0–44)
AST: 24 U/L (ref 15–41)
Albumin: 4.1 g/dL (ref 3.5–5.0)
Alkaline Phosphatase: 57 U/L (ref 38–126)
Anion gap: 8 (ref 5–15)
BUN: 18 mg/dL (ref 6–20)
CO2: 26 mmol/L (ref 22–32)
Calcium: 9.5 mg/dL (ref 8.9–10.3)
Chloride: 105 mmol/L (ref 98–111)
Creatinine: 0.91 mg/dL (ref 0.61–1.24)
GFR, Estimated: >60 mL/min (ref >60)
Glucose, Bld: 77 mg/dL (ref 70–99)
Potassium: 4.5 mmol/L (ref 3.5–5.1)
Sodium: 139 mmol/L (ref 135–145)
Total Bilirubin: 0.9 mg/dL (ref 0.0–1.2)
Total Protein: 7.3 g/dL (ref 6.5–8.1)

## 2023-05-16 LAB — TOTAL PROTEIN, URINE DIPSTICK: Protein, ur: NEGATIVE mg/dL

## 2023-05-16 MED ORDER — ATROPINE SULFATE 1 MG/ML IV SOLN
0.5000 mg | Freq: Once | INTRAVENOUS | Status: AC | PRN
  Administered 2023-05-16: 0.5 mg via INTRAVENOUS
  Filled 2023-05-16: qty 1

## 2023-05-16 MED ORDER — SODIUM CHLORIDE 0.9 % IV SOLN
Freq: Once | INTRAVENOUS | Status: AC
  Filled 2023-05-16: qty 250

## 2023-05-16 MED ORDER — PALONOSETRON HCL INJECTION 0.25 MG/5ML
0.2500 mg | Freq: Once | INTRAVENOUS | Status: AC
  Administered 2023-05-16: 0.25 mg via INTRAVENOUS
  Filled 2023-05-16: qty 5

## 2023-05-16 MED ORDER — SODIUM CHLORIDE 0.9 % IV SOLN
150.0000 mg/m2 | Freq: Once | INTRAVENOUS | Status: AC
  Administered 2023-05-16: 320 mg via INTRAVENOUS
  Filled 2023-05-16: qty 16

## 2023-05-16 MED ORDER — DEXAMETHASONE SODIUM PHOSPHATE 10 MG/ML IJ SOLN
10.0000 mg | Freq: Once | INTRAMUSCULAR | Status: AC
  Administered 2023-05-16: 10 mg via INTRAVENOUS
  Filled 2023-05-16: qty 1

## 2023-05-16 MED ORDER — SODIUM CHLORIDE 0.9 % IV SOLN
400.0000 mg/m2 | Freq: Once | INTRAVENOUS | Status: AC
  Administered 2023-05-16: 856 mg via INTRAVENOUS
  Filled 2023-05-16: qty 42.8

## 2023-05-16 MED ORDER — FLUOROURACIL CHEMO INJECTION 5 GM/100ML
2400.0000 mg/m2 | INTRAVENOUS | Status: DC
  Administered 2023-05-16: 5000 mg via INTRAVENOUS
  Filled 2023-05-16: qty 100

## 2023-05-16 MED ORDER — FLUOROURACIL CHEMO INJECTION 2.5 GM/50ML
400.0000 mg/m2 | Freq: Once | INTRAVENOUS | Status: AC
  Administered 2023-05-16: 850 mg via INTRAVENOUS
  Filled 2023-05-16: qty 17

## 2023-05-16 MED ORDER — SODIUM CHLORIDE 0.9 % IV SOLN
5.0000 mg/kg | Freq: Once | INTRAVENOUS | Status: AC
  Administered 2023-05-16: 500 mg via INTRAVENOUS
  Filled 2023-05-16: qty 16

## 2023-05-16 NOTE — Progress Notes (Signed)
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  He was found to have a hemoglobin of 4.5/18.7 on admission.  He received blood transfusion as well as IV iron and his hemoglobin is presently up to an 8.  He underwent colonoscopy which showed malignant partially obstructing tumor that was circumferential 80 cm proximal to the anus.  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  CT chest abdomen and pelvis with contrast did not show any evidence of distant metastatic disease.  2 small sclerotic densities in the acetabulum and proximal right femur likely benign process.  Pathologically enlarged lymph nodes superior to circumferential colonic lesion suggesting metastatic adenopathy.   Patient underwent right hemicolectomy with Dr. Everlene Farrier on 07/12/2021.  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   Vermiform appendix negative for tumor.  Radial margin positive for tumor (2 lymph nodes at margin with adenocarcinoma).  Proximal and distal margins negative.  7 out of 56 lymph nodes positive for metastatic adenocarcinoma.  Tumor deposits not applicable.  No lymphovascular or perineural invasion seen.  PT4APN2B.  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  BRAF testing was negative.  Genetic testing was offered to the patient but he declined   Fertility conservation was also discussed an option of sperm banking at [facility] was discussed with the patient and his healthcare power of attorney [name].  There would be a one-time cost involved for sperm banking and a yearly cost for preserving the specimen.  Overall chances of infertility with FOLFOX is low but possible.  Patient and his healthcare power of attorney have discussed their options and have decided not to proceed with fertility conservation at this time   Patient completed 12 cycles of adjuvant FOLFOX chemotherapy in followed by adjuvant radiation therapy in September 2023    CT scan and PET scan showed concern for metastatic disease.  18 x 16 mm left lower lobe lung nodule with an SUV of 2.4.  Progressive abdominal/retroperitoneal adenopathy with lymph nodes measuring between 6 mm to 18 mm which were PET avid.  Lung biopsy was negative for malignancy.  Retroperitoneal adenopathy was biopsied and was consistent with mucinous adenocarcinoma of colon primary.  Tissue Tempus testing ATM, BRCA2, PIK3CA mutation.  MLH1 mutation. No evidence of K-ras BRAF or NRAS mutation.  Tumor mutational burden high 95th percentile MSI status could not be assessed because the tumor content is below 30%  Interval history-he did feel fatigued after the last cycle of chemotherapy which was  self-limited.  Occasional nausea well-controlled with nausea medications  ECOG PS- 0 Pain scale- 0   Review of  systems- Review of Systems  Constitutional:  Negative for chills, fever, malaise/fatigue and weight loss.  HENT:  Negative for congestion, ear discharge and nosebleeds.   Eyes:  Negative for blurred vision.  Respiratory:  Negative for cough, hemoptysis, sputum production, shortness of breath and wheezing.   Cardiovascular:  Negative for chest pain, palpitations, orthopnea and claudication.  Gastrointestinal:  Negative for abdominal pain, blood in stool, constipation, diarrhea, heartburn, melena, nausea and vomiting.  Genitourinary:  Negative for dysuria, flank pain, frequency, hematuria and urgency.  Musculoskeletal:  Negative for back pain, joint pain and myalgias.  Skin:  Negative for rash.  Neurological:  Negative for dizziness, tingling, focal weakness, seizures, weakness and headaches.  Endo/Heme/Allergies:  Does not bruise/bleed easily.  Psychiatric/Behavioral:  Negative for depression and suicidal ideas. The patient does not have insomnia.       Allergies  Allergen Reactions   Geodon [Ziprasidone Hcl] Anaphylaxis     Past Medical History:  Diagnosis Date   Bipolar 1 disorder (HCC)    Colon cancer (HCC)    Lung nodule 09/2022   PTSD (post-traumatic stress disorder)      Past Surgical History:  Procedure Laterality Date   BRONCHIAL NEEDLE ASPIRATION BIOPSY  10/12/2022   Procedure: BRONCHIAL NEEDLE ASPIRATION BIOPSIES;  Surgeon: Raechel Chute, MD;  Location: [facility] ENDOSCOPY;  Service: Pulmonary;;   COLON RESECTION N/A 07/12/2021   Procedure: HAND ASSISTED LAPAROSCOPIC COLON RESECTION;  Surgeon: Leafy Ro, MD;  Location: [facility] ORS;  Service: General;  Laterality: N/A;   COLONOSCOPY WITH PROPOFOL N/A 07/06/2021   Procedure: COLONOSCOPY WITH PROPOFOL;  Surgeon: Toney Reil, MD;  Location: [facility] ENDOSCOPY;  Service: Gastroenterology;  Laterality: N/A;   COLONOSCOPY WITH PROPOFOL N/A 07/07/2021   Procedure: COLONOSCOPY WITH PROPOFOL;  Surgeon: Toney Reil, MD;   Location: [facility] ENDOSCOPY;  Service: Gastroenterology;  Laterality: N/A;   COLONOSCOPY WITH PROPOFOL N/A 08/02/2022   Procedure: COLONOSCOPY WITH PROPOFOL;  Surgeon: Toney Reil, MD;  Location: [facility] ENDOSCOPY;  Service: Gastroenterology;  Laterality: N/AEarvin Hansen (Caregiver & Transporter) [phone] [name] [phone] will be available for phone consent   ESOPHAGOGASTRODUODENOSCOPY (EGD) WITH PROPOFOL N/A 07/06/2021   Procedure: ESOPHAGOGASTRODUODENOSCOPY (EGD) WITH PROPOFOL;  Surgeon: Toney Reil, MD;  Location: [facility] ENDOSCOPY;  Service: Gastroenterology;  Laterality: N/A;   GIVENS CAPSULE STUDY N/A 07/06/2021   Procedure: GIVENS CAPSULE STUDY;  Surgeon: Toney Reil, MD;  Location: [facility] ENDOSCOPY;  Service: Gastroenterology;  Laterality: N/A;   IR IMAGING GUIDED PORT INSERTION  01/19/2023   IR REMOVAL TUN ACCESS W/ PORT W/O FL MOD SED  02/15/2022   PORTACATH PLACEMENT Right 07/22/2021   Procedure: INSERTION PORT-A-CATH;  Surgeon: Leafy Ro, MD;  Location: [facility] ORS;  Service: General;  Laterality: Right;    Social History   Socioeconomic History   Marital status: Single    Spouse name: Not on file   Number of children: Not on file   Years of education: Not on file   Highest education level: Not on file  Occupational History   Not on file  Tobacco Use   Smoking status: Every Day    Current packs/day: 0.25    Types: Cigarettes   Smokeless tobacco: Current   Tobacco comments:    4 cigarettes a day - khj 09/28/2022  Vaping Use   Vaping status: Every Day   Devices: elfbar  Substance and Sexual Activity   Alcohol use: Not Currently   Drug use: Not Currently   Sexual activity: Not Currently  Other Topics Concern   Not on file  Social History Narrative   Not on file   Social Drivers of Health   Financial Resource Strain: Not on file  Food Insecurity: Not on file  Transportation Needs: Not on file  Physical Activity: Not on file  Stress:  Not on file  Social Connections: Not on file  Intimate Partner Violence: Not on file    Family History  Problem Relation Age of Onset   Kidney cancer Father    Breast cancer Maternal Aunt      Current Outpatient Medications:    acetaminophen (TYLENOL) 325 MG tablet, Take 2 tablets (650 mg total) by mouth every 6 (six) hours as needed for mild pain or moderate pain., Disp: 30 tablet, Rfl: 0   ARIPiprazole (ABILIFY) 10 MG tablet, Take 10 mg by mouth in the morning., Disp: , Rfl:    ARIPiprazole ER (ABILIFY MAINTENA) 400 MG PRSY prefilled syringe, Inject 400 mg into the muscle every 30 (thirty) days., Disp: , Rfl:    benztropine (COGENTIN) 2 MG tablet, Take 2 mg by mouth at bedtime., Disp: , Rfl:    busPIRone (BUSPAR) 10 MG tablet, Take 10 mg by mouth 2 (two) times daily., Disp: , Rfl:    dexamethasone (DECADRON) 4 MG tablet, Take 2 tablets (8 mg total) by mouth daily. Start the day after chemotherapy for 2 days. Take with food., Disp: 8 tablet, Rfl: 5   FLUoxetine (PROZAC) 20 MG capsule, Take 20 mg by mouth daily., Disp: , Rfl:    ibuprofen (ADVIL) 600 MG tablet, Take 600 mg by mouth every 6 (six) hours as needed for moderate pain., Disp: , Rfl:    ibuprofen (ADVIL) 800 MG tablet, Take 1 tablet (800 mg total) by mouth every 8 (eight) hours as needed., Disp: 30 tablet, Rfl: 0   lidocaine-prilocaine (EMLA) cream, Apply 1 Application topically See admin instructions. Apply to affected area once as directed 1 hour before the pt. Comes to get chemo, Disp: 30 g, Rfl: 2   loperamide (IMODIUM) 2 MG capsule, Take 2 tabs by mouth with first loose stool, then 1 tab with each additional loose stool as needed. Do not exceed 8 tabs in a 24-hour period, Disp: 60 capsule, Rfl: 0   Melatonin 10 MG CAPS, Take 10 mg by mouth at bedtime., Disp: , Rfl:    ondansetron (ZOFRAN) 8 MG tablet, Take 8 mg by mouth 2 (two) times daily as needed for nausea or vomiting., Disp: , Rfl:    ondansetron (ZOFRAN) 8 MG tablet,  Take 1 tablet (8 mg total) by mouth every 8 (eight) hours as needed for nausea, vomiting or refractory nausea / vomiting. Start on the third day after chemotherapy., Disp: 30 tablet, Rfl: 1   oxyCODONE (OXY IR/ROXICODONE) 5 MG immediate release tablet, Take 5 mg by mouth every 6 (six) hours as needed for severe pain or breakthrough pain. (Patient not taking: Reported on 03/21/2023), Disp: , Rfl:    pantoprazole (PROTONIX) 20 MG tablet, Take 1 tablet (20 mg total) by mouth daily., Disp: 30 tablet, Rfl: 3   polyethylene glycol (MIRALAX / GLYCOLAX) 17 g packet, Take 17 g by mouth daily as needed (constipation)., Disp: 14 each, Rfl: 0   prazosin (MINIPRESS) 2 MG capsule, Take 2 mg by mouth 2 (two) times daily., Disp: , Rfl:    prochlorperazine (  COMPAZINE) 10 MG tablet, Take 10 mg by mouth every 6 (six) hours as needed for nausea or vomiting., Disp: , Rfl:   Physical exam:  Vitals:   05/16/23 0833  BP: 130/88  Pulse: 87  Resp: 17  Temp: (!) 96.2 F (35.7 C)  TempSrc: Tympanic  SpO2: 99%  Weight: 203 lb (92.1 kg)   Physical Exam Cardiovascular:     Rate and Rhythm: Normal rate and regular rhythm.     Heart sounds: Normal heart sounds.  Pulmonary:     Effort: Pulmonary effort is normal.     Breath sounds: Normal breath sounds.  Abdominal:     General: Bowel sounds are normal.     Palpations: Abdomen is soft.  Skin:    General: Skin is warm and dry.  Neurological:     Mental Status: He is alert and oriented to person, place, and time.         Latest Ref Rng & Units 04/27/2023    8:51 AM  CMP  Glucose 70 - 99 mg/dL 295   BUN 6 - 20 mg/dL 16   Creatinine 2.84 - 1.24 mg/dL 1.32   Sodium 440 - 102 mmol/L 137   Potassium 3.5 - 5.1 mmol/L 4.3   Chloride 98 - 111 mmol/L 104   CO2 22 - 32 mmol/L 24   Calcium 8.9 - 10.3 mg/dL 9.4   Total Protein 6.5 - 8.1 g/dL 7.3   Total Bilirubin 0.0 - 1.2 mg/dL 0.5   Alkaline Phos 38 - 126 U/L 71   AST 15 - 41 U/L 25   ALT 0 - 44 U/L 20        Latest Ref Rng & Units 05/16/2023    8:10 AM  CBC  WBC 4.0 - 10.5 K/uL 4.7   Hemoglobin 13.0 - 17.0 g/dL 72.5   Hematocrit 36.6 - 52.0 % 41.3   Platelets 150 - 400 K/uL 248     No images are attached to the encounter.  CT CHEST ABDOMEN PELVIS W CONTRAST Result Date: 04/20/2023 CLINICAL DATA:  Metastatic colon cancer restaging, assess treatment response * Tracking Code: BO * EXAM: CT CHEST, ABDOMEN, AND PELVIS WITH CONTRAST TECHNIQUE: Multidetector CT imaging of the chest, abdomen and pelvis was performed following the standard protocol during bolus administration of intravenous contrast. RADIATION DOSE REDUCTION: This exam was performed according to the departmental dose-optimization program which includes automated exposure control, adjustment of the mA and/or kV according to patient size and/or use of iterative reconstruction technique. CONTRAST:  OMNIPAQUE IOHEXOL 300 MG/ML SOLN additional oral enteric contrast COMPARISON:  11/27/2022 FINDINGS: CT CHEST FINDINGS Cardiovascular: Right chest port catheter. Normal heart size. No pericardial effusion. Mediastinum/Nodes: No enlarged mediastinal, hilar, or axillary lymph nodes. Thyroid gland, trachea, and esophagus demonstrate no significant findings. Lungs/Pleura: Enlargement of a left lower lobe pulmonary nodule, measuring 2.3 x 2.0 cm, previously 1.8 x 1.6 cm (series 4, image 117). Interval enlargement of a nodule of the medial left lower lobe measuring 1.2 x 0.9 cm, previously 0.7 cm (series 4, image 115). No pleural effusion or pneumothorax. Musculoskeletal: No chest wall abnormality. No acute osseous findings. CT ABDOMEN PELVIS FINDINGS Hepatobiliary: No solid liver abnormality is seen. No gallstones, gallbladder wall thickening, or biliary dilatation. Pancreas: Unremarkable. No pancreatic ductal dilatation or surrounding inflammatory changes. Spleen: Normal in size without significant abnormality. Adrenals/Urinary Tract: Adrenal glands are  unremarkable. Kidneys are normal, without renal calculi, solid lesion, or hydronephrosis. Bladder is unremarkable. Stomach/Bowel: Stomach is within  normal limits. Status post right hemicolectomy and reanastomosis. No evidence of bowel wall thickening, distention, or inflammatory changes. Vascular/Lymphatic: No significant vascular findings are present. Unchanged enlarged, partially calcified retroperitoneal lymph nodes, measuring up to 2.8 x 1.7 cm (series 2, image 63). Reproductive: No mass or other abnormality. Other: No abdominal wall hernia or abnormality. No ascites. Musculoskeletal: No acute osseous findings. IMPRESSION: 1. Enlargement of left lower lobe pulmonary nodules consistent with worsened pulmonary metastatic disease. 2. Unchanged enlarged, partially calcified retroperitoneal lymph nodes. 3. Status post right hemicolectomy and reanastomosis. Electronically Signed   By: Jearld Lesch M.D.   On: 04/20/2023 22:08     Assessment and plan- Patient is a 38 y.o. male with history of metastatic colon cancer here for on treatment assessment prior to cycle 8 of palliative FOLFIRI Avastin chemotherapy  Counts okay to proceed with cycle 8 of palliative FOLFIRI Avastin chemotherapy today.  Blood pressure is stable and urine protein remains trace.  He will continue this regimen until progression or toxicity.  I will see him back in 2 weeks for cycle 9.Recent scans in January 2025 showed stable size of retroperitoneal lymph node but enlargement of left lower lobe pulmonary nodule concerning for worsening disease.  1 of these nodules were biopsied and was negative for malignancy.  I am however referring him to radiation oncology for consideration of SBRT to this area.   Visit Diagnosis 1. Encounter for antineoplastic chemotherapy   2. Encounter for monoclonal antibody treatment for malignancy   3. Adenocarcinoma of colon [facility])      Dr. Owens Shark, MD, MPH [facility] [phone] 05/16/2023 8:25 AM

## 2023-05-16 NOTE — Patient Instructions (Signed)
[facility] - A DEPT OF [facility]  Discharge Instructions: Thank you for choosing [facility] to provide your oncology and hematology care.  If you have a lab appointment with the Cancer Center, please go directly to the Cancer Center and check in at the registration area.  Wear comfortable clothing and clothing appropriate for easy access to any Portacath or PICC line.   We strive to give you quality time with your provider. You may need to reschedule your appointment if you arrive late (15 or more minutes).  Arriving late affects you and other patients whose appointments are after yours.  Also, if you miss three or more appointments without notifying the office, you may be dismissed from the clinic at the provider's discretion.      For prescription refill requests, have your pharmacy contact our office and allow 72 hours for refills to be completed.     To help prevent nausea and vomiting after your treatment, we encourage you to take your nausea medication as directed.  BELOW ARE SYMPTOMS THAT SHOULD BE REPORTED IMMEDIATELY: *FEVER GREATER THAN 100.4 F (38 C) OR HIGHER *CHILLS OR SWEATING *NAUSEA AND VOMITING THAT IS NOT CONTROLLED WITH YOUR NAUSEA MEDICATION *UNUSUAL SHORTNESS OF BREATH *UNUSUAL BRUISING OR BLEEDING *URINARY PROBLEMS (pain or burning when urinating, or frequent urination) *BOWEL PROBLEMS (unusual diarrhea, constipation, pain near the anus) TENDERNESS IN MOUTH AND THROAT WITH OR WITHOUT PRESENCE OF ULCERS (sore throat, sores in mouth, or a toothache) UNUSUAL RASH, SWELLING OR PAIN  UNUSUAL VAGINAL DISCHARGE OR ITCHING   Items with * indicate a potential emergency and should be followed up as soon as possible or go to the Emergency Department if any problems should occur.  Please show the CHEMOTHERAPY ALERT CARD or IMMUNOTHERAPY ALERT CARD at check-in to the Emergency Department and triage nurse.  Should you have  questions after your visit or need to cancel or reschedule your appointment, please contact [facility] - A DEPT OF [facility]  [phone] and follow the prompts.  Office hours are 8:00 a.m. to 4:30 p.m. Monday - Friday. Please note that voicemails left after 4:00 p.m. may not be returned until the following business day.  We are closed weekends and major holidays. You have access to a nurse at all times for urgent questions. Please call the main number to the clinic [phone] and follow the prompts.  For any non-urgent questions, you may also contact your provider using MyChart. We now offer e-Visits for anyone 78 and older to request care online for non-urgent symptoms. For details visit [email or web address].   Also download the MyChart app! Go to the app store, search "MyChart", open the app, select [place], and log in with your MyChart username and password.

## 2023-05-16 NOTE — Progress Notes (Signed)
Radiation Oncology Follow up Note  Name: Connor Wilkinson   Date:   05/16/2023 MRN:  [id number] DOB: 07/16/1985    This 38 y.o. 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  REFERRING PROVIDER: [name], MD  HPI: Patient is a 38 year old male well-known to our department having been treated back in September 23.  He received adjuvant radiation therapy status post resection of a stage IIIc adenocarcinoma the transverse and ascending colon and received adjuvant FOLFOX chemotherapy along with concurrent chemoradiation therapy.  His initial tumor was 11.5 cm long grade 3 and invaded into the pericolonic adipose tissue.  7 of 56 lymph nodes were positive for metastatic disease He is currently on palliative FOLFIRI and Avastin chemotherapy which she continues to tolerate well.  Recent imaging studies have shown stable size of retroperitoneal lymph nodes and enlargement of left lower lobe pulmonary nodules concerning for worsening disease.  1 of these nodules was biopsied and negative for malignancy.  PET scan had shown progressive disease in the left lower lobe with SUV of 2.4..  Recent CT scan showed enlarging left lower lobe pulm nodules consistent with worsening pulmonary metastatic disease.  He is asymptomatic specifically [place] cough hemoptysis chest tightness or any change in his pulmonary status.  He is referred today for consideration of SBRT treatment.  The 2 largest nodules measure 2.3 cm in greatest dimension as well as the second 1 measures 1.2 in greatest dimension. COMPLICATIONS OF TREATMENT: none  FOLLOW UP COMPLIANCE: keeps appointments   PHYSICAL EXAM:  There were no vitals taken for this visit. Well-developed well-nourished patient in NAD. HEENT reveals PERLA, EOMI, discs not visualized.  Oral cavity is clear. No oral mucosal lesions are identified. Neck is clear without evidence of  cervical or supraclavicular adenopathy. Lungs are clear to A&P. Cardiac examination is essentially unremarkable with regular rate and rhythm without murmur rub or thrill. Abdomen is benign with no organomegaly or masses noted. Motor sensory and DTR levels are equal and symmetric in the upper and lower extremities. Cranial nerves II through XII are grossly intact. Proprioception is intact. No peripheral adenopathy or edema is identified. No motor or sensory levels are noted. Crude visual fields are within normal range.  RADIOLOGY RESULTS: PET/CT and CT scans reviewed compatible with above-stated findings  PLAN: At this time I have offered SBRT treatment to the largest of his pulmonary lung nodules.  May try to accomplish both at the same treatment course although that may be difficult based on their proximity.  Will plan on delivering 60 Gray in 5 fractions using SBRT treatment.  Risks and benefits of treatment occluding extremely low side effect profile possible developing cough and fatigue all were discussed in detail with the patient.  I personally set up and ordered CT simulation for next week.  I would like to take this opportunity to thank you for allowing me to participate in the care of your patient.Carmina Miller, MD

## 2023-05-17 ENCOUNTER — Other Ambulatory Visit: Payer: Self-pay

## 2023-05-18 ENCOUNTER — Inpatient Hospital Stay: Payer: MEDICAID

## 2023-05-18 VITALS — BP 123/85 | HR 88 | Resp 18

## 2023-05-18 DIAGNOSIS — Z5111 Encounter for antineoplastic chemotherapy: Secondary | ICD-10-CM | POA: Diagnosis not present

## 2023-05-18 DIAGNOSIS — C189 Malignant neoplasm of colon, unspecified: Secondary | ICD-10-CM

## 2023-05-18 IMAGING — CT CT CHEST-ABD-PELV W/ CM
2 of 4 series · 13 of 36 positions shown, 15 images · IV contrast (agent unspecified)
Comparison: None.

CLINICAL DATA: Colon carcinoma, evaluate for metastatic disease

EXAM:
CT CHEST, ABDOMEN, AND PELVIS WITH CONTRAST
TECHNIQUE: Multidetector CT imaging of the chest, abdomen and pelvis was
performed following the standard protocol during bolus
administration of intravenous contrast.

[Series 2: cap with · axial · 0.79mm/px · z∈[-648,-83]mm · 10 of 139 slices shown, 12 images]
[im 13/139  mediastinal]
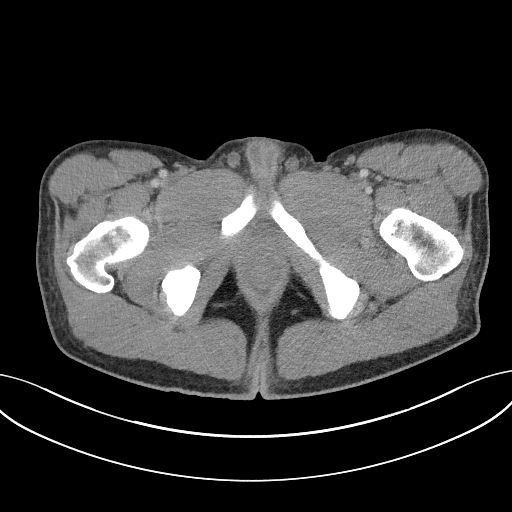
[im 13/139  bone]
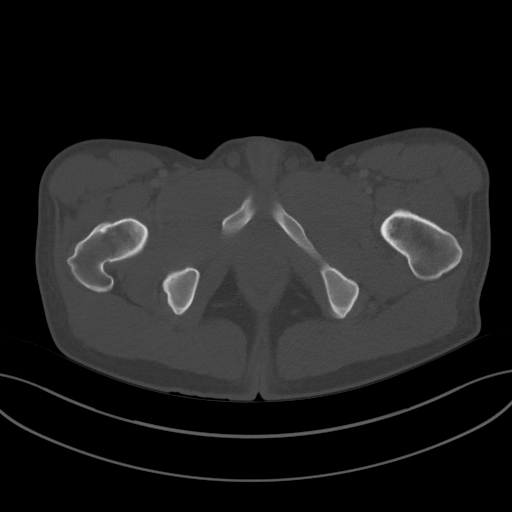
[im 26/139  mediastinal]
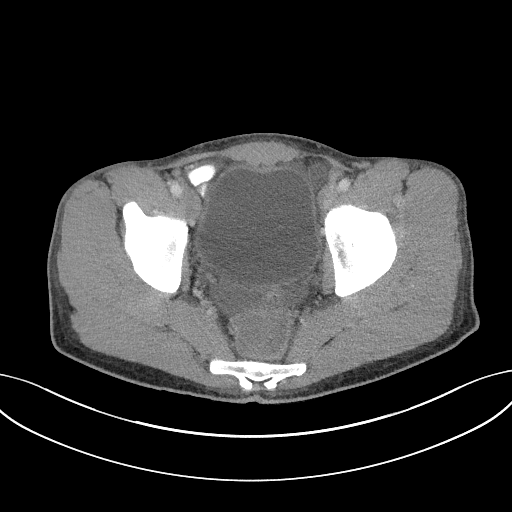
[im 38/139  mediastinal]
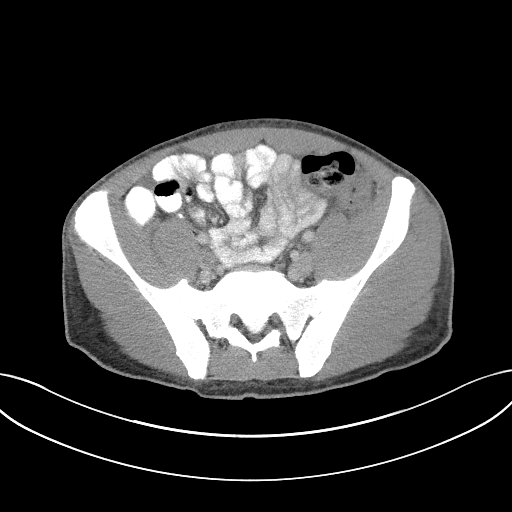
[im 51/139  mediastinal]
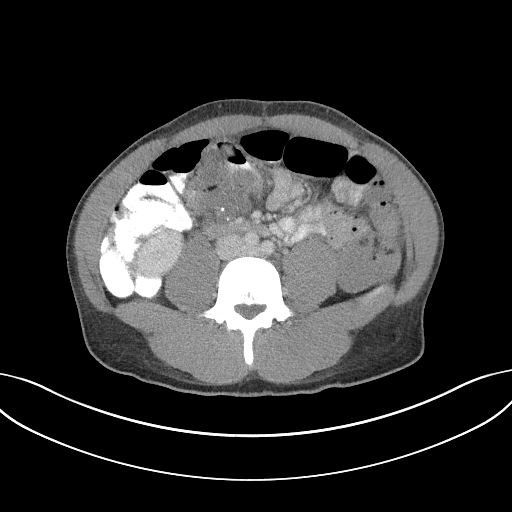
[im 63/139  mediastinal]
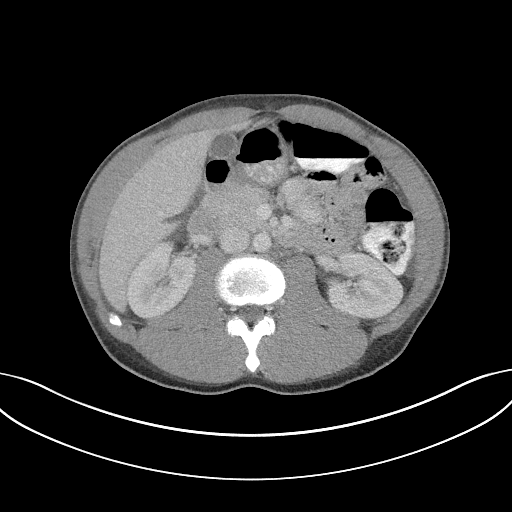
[im 76/139  mediastinal]
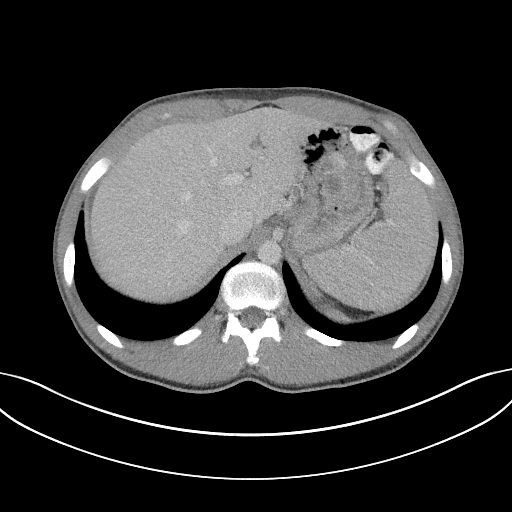
[im 88/139  mediastinal]
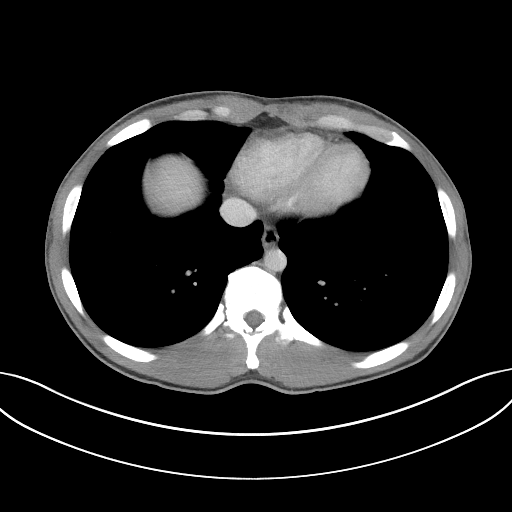
[im 101/139  mediastinal]
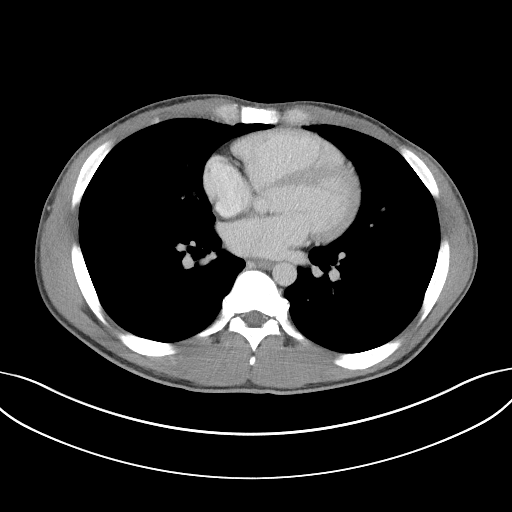
[im 113/139  mediastinal]
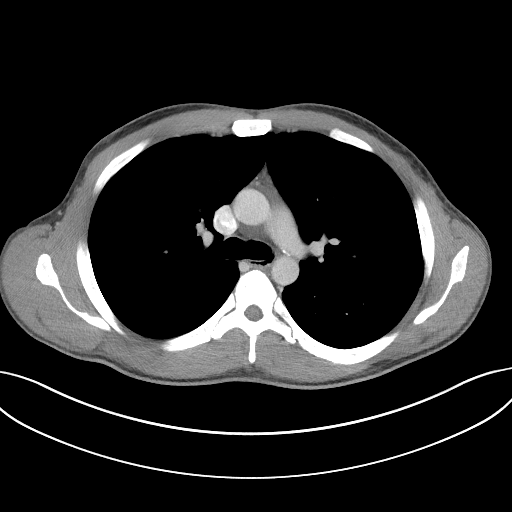
[im 113/139  bone]
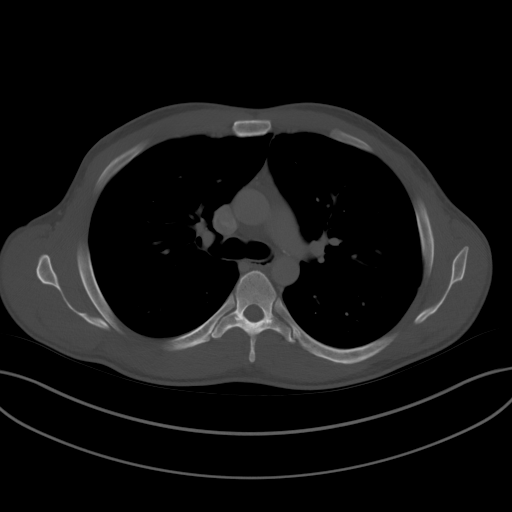
[im 126/139  mediastinal]
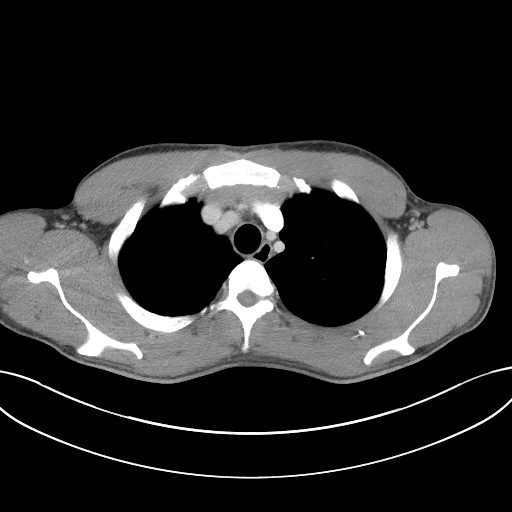

[Series 5: coronals · coronal · 0.85mm/px · 3 of 135 slices shown]
[im 27/135  mediastinal]
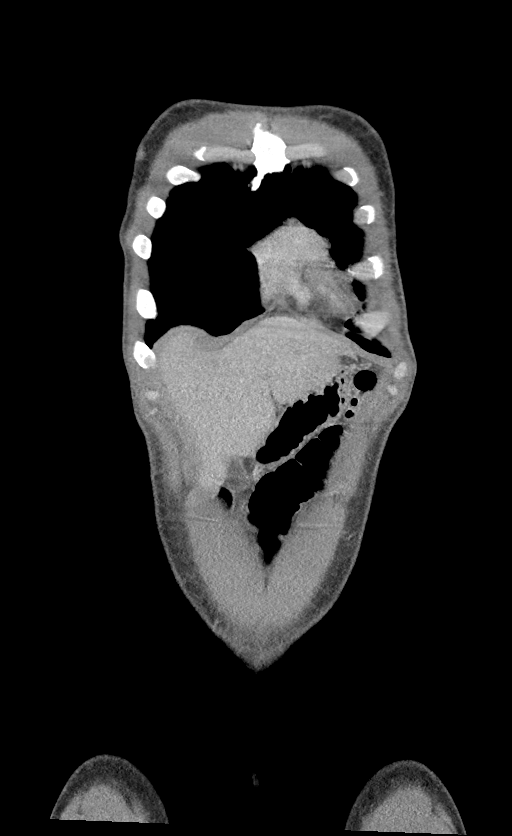
[im 54/135  mediastinal]
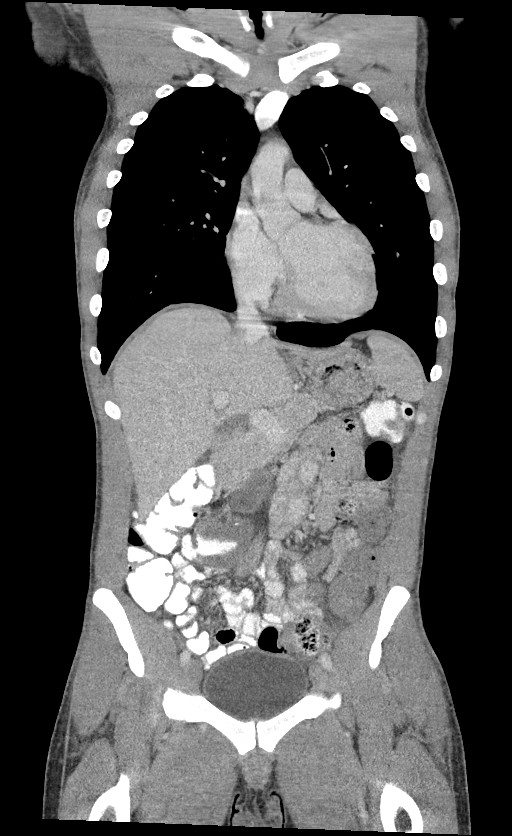
[im 81/135  mediastinal]
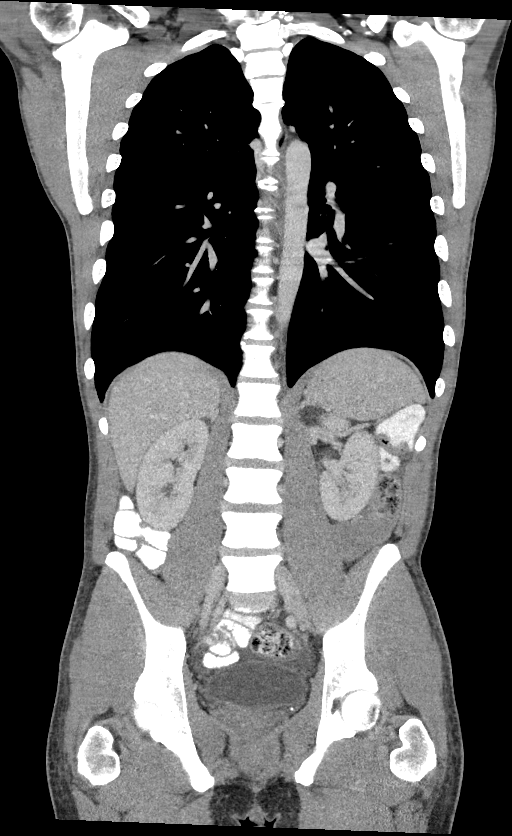

[13 of 36 positions shown; findings below may reference images not displayed]

RADIATION DOSE REDUCTION: This exam was performed according to the
departmental dose-optimization program which includes automated
exposure control, adjustment of the mA and/or kV according to
patient size and/or use of iterative reconstruction technique.

CONTRAST:  100mL OMNIPAQUE IOHEXOL 300 MG/ML  SOLN
FINDINGS: CT CHEST FINDINGS

Cardiovascular: Unremarkable.

Mediastinum/Nodes: No significant lymphadenopathy seen in the
mediastinum.

Lungs/Pleura: There are no discrete lung nodules. There is no focal
pulmonary consolidation. There is no pleural effusion or
pneumothorax.

CT ABDOMEN PELVIS FINDINGS

Hepatobiliary: No focal abnormality is seen in the liver. There is
no dilation of bile ducts. Gallbladder is unremarkable. There is
minimal amount of ascites adjacent to the liver.

Pancreas: No focal abnormality is seen.

Spleen: Unremarkable.

Adrenals/Urinary Tract: Adrenals are not enlarged. There is no
hydronephrosis. There are no renal or ureteral stones. Urinary
bladder is unremarkable.

Stomach/Bowel: Stomach is unremarkable. Small bowel loops are not
dilated. Appendix is not dilated. There is abnormal circumferential
wall thickening in the proximal transverse colon close to the
hepatic flexure. Length of the lesion is proximally 6.2 cm. Wall
thickness measures up to 2.2 cm. No other focal abnormality is seen
in the colon. There is fluid in the lumen of rectosigmoid.

Vascular/Lymphatic: Vascular structures are unremarkable. There are
abnormally enlarged lymph nodes immediately superior to the
circumferential colonic mass. Lymph nodes measure up to 2.5 cm in
short axis.

Reproductive: Unremarkable.

Other: Small amount of ascites is present in the pelvic cavity. No
definite demonstrable discrete nodules seen in the peritoneum.

Musculoskeletal: There is 6 mm sclerotic density in the right
acetabulum. There is 15 x 6 mm linear sclerotic density in the neck
of right femur.
IMPRESSION: There is circumferential soft tissue mass lesion in the proximal
transverse colon measuring 6.2 cm in length and 2.2 cm in maximal
wall thickness. This finding would be consistent with history of
colon carcinoma.

There are pathologically enlarged lymph nodes immediately superior
to the circumferential colonic lesion suggesting metastatic
lymphadenopathy.

There are 2 small sclerotic densities in the right acetabulum and
neck of proximal right femur which may suggest incidental benign
process such as bone islands or sclerotic metastatic disease. No
other discrete skeletal lesions are seen. If clinically warranted,
radionuclide bone scan or PET-CT should be considered. If the
patient has any prior studies in outside institutions, they would be
of help to assess stability or interval change.

Small amount of ascites is present in the pelvic cavity and adjacent
to the liver. This may be due to nonspecific enteritis or suggest
peritoneal metastatic disease. As far as seen, no discrete soft
tissue nodules are seen in the peritoneal surface.

No other signs metastatic disease are noted.

## 2023-05-18 MED ORDER — HEPARIN SOD (PORK) LOCK FLUSH 100 UNIT/ML IV SOLN
500.0000 [IU] | Freq: Once | INTRAVENOUS | Status: AC | PRN
  Administered 2023-05-18: 500 [IU]
  Filled 2023-05-18: qty 5

## 2023-05-18 MED ORDER — SODIUM CHLORIDE 0.9% FLUSH
10.0000 mL | INTRAVENOUS | Status: DC | PRN
  Administered 2023-05-18: 10 mL
  Filled 2023-05-18: qty 10

## 2023-05-19 ENCOUNTER — Other Ambulatory Visit: Payer: Self-pay

## 2023-05-22 ENCOUNTER — Ambulatory Visit
Admission: RE | Admit: 2023-05-22 | Discharge: 2023-05-22 | Disposition: A | Discharge status: Home / Self Care | Payer: MEDICAID | Source: Ambulatory Visit | Attending: Radiation Oncology | Admitting: Radiation Oncology

## 2023-05-22 ENCOUNTER — Encounter: Payer: Self-pay | Admitting: *Deleted

## 2023-05-22 DIAGNOSIS — C7802 Secondary malignant neoplasm of left lung: Secondary | ICD-10-CM | POA: Insufficient documentation

## 2023-05-22 DIAGNOSIS — C184 Malignant neoplasm of transverse colon: Secondary | ICD-10-CM | POA: Diagnosis not present

## 2023-05-22 DIAGNOSIS — Z510 Encounter for antineoplastic radiation therapy: Secondary | ICD-10-CM | POA: Diagnosis present

## 2023-05-23 ENCOUNTER — Other Ambulatory Visit: Payer: Self-pay

## 2023-05-25 ENCOUNTER — Other Ambulatory Visit: Payer: MEDICAID

## 2023-05-25 ENCOUNTER — Ambulatory Visit: Payer: MEDICAID | Admitting: Oncology

## 2023-05-25 ENCOUNTER — Telehealth: Payer: Self-pay

## 2023-05-25 ENCOUNTER — Ambulatory Visit: Payer: MEDICAID

## 2023-05-25 NOTE — Telephone Encounter (Signed)
 Called spoke to Joann regarding apts. All apts were discussed she verbalizes understanding and will make patient aware.

## 2023-05-28 ENCOUNTER — Ambulatory Visit: Payer: MEDICAID | Admitting: Nurse Practitioner

## 2023-05-28 ENCOUNTER — Other Ambulatory Visit: Payer: MEDICAID

## 2023-05-28 ENCOUNTER — Ambulatory Visit: Payer: MEDICAID

## 2023-05-28 DIAGNOSIS — Z510 Encounter for antineoplastic radiation therapy: Secondary | ICD-10-CM | POA: Diagnosis not present

## 2023-05-30 ENCOUNTER — Encounter: Payer: Self-pay | Admitting: Oncology

## 2023-05-30 ENCOUNTER — Inpatient Hospital Stay (HOSPITAL_BASED_OUTPATIENT_CLINIC_OR_DEPARTMENT_OTHER): Payer: MEDICAID | Admitting: Oncology

## 2023-05-30 ENCOUNTER — Inpatient Hospital Stay: Payer: MEDICAID | Attending: Oncology

## 2023-05-30 ENCOUNTER — Inpatient Hospital Stay: Payer: MEDICAID

## 2023-05-30 VITALS — BP 124/85 | HR 108 | Temp 97.3°F | Resp 20

## 2023-05-30 VITALS — HR 80

## 2023-05-30 DIAGNOSIS — C189 Malignant neoplasm of colon, unspecified: Secondary | ICD-10-CM | POA: Insufficient documentation

## 2023-05-30 DIAGNOSIS — Z803 Family history of malignant neoplasm of breast: Secondary | ICD-10-CM | POA: Diagnosis not present

## 2023-05-30 DIAGNOSIS — Z5112 Encounter for antineoplastic immunotherapy: Secondary | ICD-10-CM | POA: Diagnosis not present

## 2023-05-30 DIAGNOSIS — Z8051 Family history of malignant neoplasm of kidney: Secondary | ICD-10-CM | POA: Insufficient documentation

## 2023-05-30 DIAGNOSIS — Z5111 Encounter for antineoplastic chemotherapy: Secondary | ICD-10-CM | POA: Diagnosis present

## 2023-05-30 DIAGNOSIS — C786 Secondary malignant neoplasm of retroperitoneum and peritoneum: Secondary | ICD-10-CM | POA: Diagnosis not present

## 2023-05-30 DIAGNOSIS — F17210 Nicotine dependence, cigarettes, uncomplicated: Secondary | ICD-10-CM | POA: Diagnosis not present

## 2023-05-30 LAB — CMP (CANCER CENTER ONLY)
ALT: 17 U/L (ref 0–44)
AST: 23 U/L (ref 15–41)
Albumin: 4.2 g/dL (ref 3.5–5.0)
Alkaline Phosphatase: 61 U/L (ref 38–126)
Anion gap: 7 (ref 5–15)
BUN: 15 mg/dL (ref 6–20)
CO2: 28 mmol/L (ref 22–32)
Calcium: 9.8 mg/dL (ref 8.9–10.3)
Chloride: 103 mmol/L (ref 98–111)
Creatinine: 0.91 mg/dL (ref 0.61–1.24)
GFR, Estimated: >60 mL/min (ref >60)
Glucose, Bld: 95 mg/dL (ref 70–99)
Potassium: 4.3 mmol/L (ref 3.5–5.1)
Sodium: 138 mmol/L (ref 135–145)
Total Bilirubin: 0.4 mg/dL (ref 0.0–1.2)
Total Protein: 7.3 g/dL (ref 6.5–8.1)

## 2023-05-30 LAB — CBC WITH DIFFERENTIAL (CANCER CENTER ONLY)
Abs Immature Granulocytes: 0.01 10*3/uL (ref 0.00–0.07)
Basophils Absolute: 0 10*3/uL (ref 0.0–0.1)
Basophils Relative: 0 %
Eosinophils Absolute: 0.1 10*3/uL (ref 0.0–0.5)
Eosinophils Relative: 2 %
HCT: 42.6 % (ref 39.0–52.0)
Hemoglobin: 14.2 g/dL (ref 13.0–17.0)
Immature Granulocytes: 0 %
Lymphocytes Relative: 19 %
Lymphs Abs: 1 10*3/uL (ref 0.7–4.0)
MCH: 31.1 pg (ref 26.0–34.0)
MCHC: 33.3 g/dL (ref 30.0–36.0)
MCV: 93.4 fL (ref 80.0–100.0)
Monocytes Absolute: 0.5 10*3/uL (ref 0.1–1.0)
Monocytes Relative: 9 %
Neutro Abs: 3.6 10*3/uL (ref 1.7–7.7)
Neutrophils Relative %: 70 %
Platelet Count: 232 10*3/uL (ref 150–400)
RBC: 4.56 MIL/uL (ref 4.22–5.81)
RDW: 13.6 % (ref 11.5–15.5)
WBC Count: 5.2 10*3/uL (ref 4.0–10.5)
nRBC: 0 % (ref 0.0–0.2)

## 2023-05-30 LAB — TOTAL PROTEIN, URINE DIPSTICK: Protein, ur: NEGATIVE mg/dL

## 2023-05-30 MED ORDER — SODIUM CHLORIDE 0.9% FLUSH
10.0000 mL | INTRAVENOUS | Status: DC | PRN
Start: 2023-05-30 — End: 2023-05-30
  Administered 2023-05-30: 10 mL
  Filled 2023-05-30: qty 10

## 2023-05-30 MED ORDER — HEPARIN SOD (PORK) LOCK FLUSH 100 UNIT/ML IV SOLN
500.0000 [IU] | Freq: Once | INTRAVENOUS | Status: DC | PRN
Start: 2023-05-30 — End: 2023-05-30
  Filled 2023-05-30: qty 5

## 2023-05-30 MED ORDER — PALONOSETRON HCL INJECTION 0.25 MG/5ML
0.2500 mg | Freq: Once | INTRAVENOUS | Status: AC
  Administered 2023-05-30: 0.25 mg via INTRAVENOUS
  Filled 2023-05-30: qty 5

## 2023-05-30 MED ORDER — SODIUM CHLORIDE 0.9 % IV SOLN
2400.0000 mg/m2 | INTRAVENOUS | Status: DC
  Administered 2023-05-30: 5000 mg via INTRAVENOUS
  Filled 2023-05-30: qty 100

## 2023-05-30 MED ORDER — ATROPINE SULFATE 1 MG/ML IV SOLN
0.5000 mg | Freq: Once | INTRAVENOUS | Status: AC | PRN
  Administered 2023-05-30: 0.5 mg via INTRAVENOUS

## 2023-05-30 MED ORDER — FLUOROURACIL CHEMO INJECTION 2.5 GM/50ML
400.0000 mg/m2 | Freq: Once | INTRAVENOUS | Status: AC
  Administered 2023-05-30: 850 mg via INTRAVENOUS
  Filled 2023-05-30: qty 17

## 2023-05-30 MED ORDER — SODIUM CHLORIDE 0.9 % IV SOLN
Freq: Once | INTRAVENOUS | Status: AC
  Filled 2023-05-30: qty 250

## 2023-05-30 MED ORDER — DEXAMETHASONE SODIUM PHOSPHATE 10 MG/ML IJ SOLN
10.0000 mg | Freq: Once | INTRAMUSCULAR | Status: AC
  Administered 2023-05-30: 10 mg via INTRAVENOUS
  Filled 2023-05-30: qty 1

## 2023-05-30 MED ORDER — SODIUM CHLORIDE 0.9 % IV SOLN
5.0000 mg/kg | Freq: Once | INTRAVENOUS | Status: AC
  Administered 2023-05-30: 500 mg via INTRAVENOUS
  Filled 2023-05-30: qty 16

## 2023-05-30 MED ORDER — SODIUM CHLORIDE 0.9 % IV SOLN
150.0000 mg/m2 | Freq: Once | INTRAVENOUS | Status: AC
  Administered 2023-05-30: 320 mg via INTRAVENOUS
  Filled 2023-05-30: qty 15

## 2023-05-30 MED ORDER — SODIUM CHLORIDE 0.9 % IV SOLN
400.0000 mg/m2 | Freq: Once | INTRAVENOUS | Status: AC
  Administered 2023-05-30: 856 mg via INTRAVENOUS
  Filled 2023-05-30: qty 42.8

## 2023-05-30 NOTE — Progress Notes (Signed)
Urine protein still pending, MD Ok to proceed

## 2023-05-30 NOTE — Patient Instructions (Signed)
[facility] - A DEPT OF [facility]  Discharge Instructions: Thank you for choosing [facility] to provide your oncology and hematology care.  If you have a lab appointment with the Cancer Center, please go directly to the Cancer Center and check in at the registration area.  Wear comfortable clothing and clothing appropriate for easy access to any Portacath or PICC line.   We strive to give you quality time with your provider. You may need to reschedule your appointment if you arrive late (15 or more minutes).  Arriving late affects you and other patients whose appointments are after yours.  Also, if you miss three or more appointments without notifying the office, you may be dismissed from the clinic at the provider's discretion.      For prescription refill requests, have your pharmacy contact our office and allow 72 hours for refills to be completed.    Today you received the following chemotherapy and/or immunotherapy agents: bevacizumab/ fluorouracil & irinotecan & leucovorin    To help prevent nausea and vomiting after your treatment, we encourage you to take your nausea medication as directed.  BELOW ARE SYMPTOMS THAT SHOULD BE REPORTED IMMEDIATELY: *FEVER GREATER THAN 100.4 F (38 C) OR HIGHER *CHILLS OR SWEATING *NAUSEA AND VOMITING THAT IS NOT CONTROLLED WITH YOUR NAUSEA MEDICATION *UNUSUAL SHORTNESS OF BREATH *UNUSUAL BRUISING OR BLEEDING *URINARY PROBLEMS (pain or burning when urinating, or frequent urination) *BOWEL PROBLEMS (unusual diarrhea, constipation, pain near the anus) TENDERNESS IN MOUTH AND THROAT WITH OR WITHOUT PRESENCE OF ULCERS (sore throat, sores in mouth, or a toothache) UNUSUAL RASH, SWELLING OR PAIN  UNUSUAL VAGINAL DISCHARGE OR ITCHING   Items with * indicate a potential emergency and should be followed up as soon as possible or go to the Emergency Department if any problems should occur.  Please show the  CHEMOTHERAPY ALERT CARD or IMMUNOTHERAPY ALERT CARD at check-in to the Emergency Department and triage nurse.  Should you have questions after your visit or need to cancel or reschedule your appointment, please contact [facility] - A DEPT OF [facility]  [phone] and follow the prompts.  Office hours are 8:00 a.m. to 4:30 p.m. Monday - Friday. Please note that voicemails left after 4:00 p.m. may not be returned until the following business day.  We are closed weekends and major holidays. You have access to a nurse at all times for urgent questions. Please call the main number to the clinic [phone] and follow the prompts.  For any non-urgent questions, you may also contact your provider using MyChart. We now offer e-Visits for anyone 72 and older to request care online for non-urgent symptoms. For details visit [email or web address].   Also download the MyChart app! Go to the app store, search "MyChart", open the app, select [place], and log in with your MyChart username and password.

## 2023-05-30 NOTE — Progress Notes (Signed)
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  He was found to have a hemoglobin of 4.5/18.7 on admission.  He received blood transfusion as well as IV iron and his hemoglobin is presently up to an 8.  He underwent colonoscopy which showed malignant partially obstructing tumor that was circumferential 80 cm proximal to the anus.  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  CT chest abdomen and pelvis with contrast did not show any evidence of distant metastatic disease.  2 small sclerotic densities in the acetabulum and proximal right femur likely benign process.  Pathologically enlarged lymph nodes superior to circumferential colonic lesion suggesting metastatic adenopathy.   Patient underwent right hemicolectomy with Dr. Everlene Farrier on 07/12/2021.  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   Vermiform appendix negative for tumor.  Radial margin positive for tumor (2 lymph nodes at margin with adenocarcinoma).  Proximal and distal margins negative.  7 out of 56 lymph nodes positive for metastatic adenocarcinoma.  Tumor deposits not applicable.  No lymphovascular or perineural invasion seen.  PT4APN2B.  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  BRAF testing was negative.  Genetic testing was offered to the patient but he declined   Fertility conservation was also discussed an option of sperm banking at [facility] was discussed with the patient and his healthcare power of attorney [name].  There would be a one-time cost involved for sperm banking and a yearly cost for preserving the specimen.  Overall chances of infertility with FOLFOX is low but possible.  Patient and his healthcare power of attorney have discussed their options and have decided not to proceed with fertility conservation at this time   Patient completed 12 cycles of adjuvant FOLFOX chemotherapy in followed by adjuvant radiation therapy in September 2023    CT scan and PET scan showed concern for metastatic disease.  18 x 16 mm left lower lobe lung nodule with an SUV of 2.4.  Progressive abdominal/retroperitoneal adenopathy with lymph nodes measuring between 6 mm to 18 mm which were PET avid.  Lung biopsy was negative for malignancy.  Retroperitoneal adenopathy was biopsied and was consistent with mucinous adenocarcinoma of colon primary.  Tissue Tempus testing ATM, BRCA2, PIK3CA mutation.  MLH1 mutation. No evidence of K-ras BRAF or NRAS mutation.  Tumor mutational burden high 95th percentile MSI status could not be assessed because the tumor content is below 30%    Interval history-he experienced fatigue for 2 to 3 days postchemotherapy.  Had 1 episode of nausea and vomiting.  No significant diarrhea  ECOG PS- 0 Pain scale- 0  Review of systems- Review of  Systems  Constitutional:  Negative for chills, fever, malaise/fatigue and weight loss.  HENT:  Negative for congestion, ear discharge and nosebleeds.   Eyes:  Negative for blurred vision.  Respiratory:  Negative for cough, hemoptysis, sputum production, shortness of breath and wheezing.   Cardiovascular:  Negative for chest pain, palpitations, orthopnea and claudication.  Gastrointestinal:  Positive for nausea. Negative for abdominal pain, blood in stool, constipation, diarrhea, heartburn, melena and vomiting.  Genitourinary:  Negative for dysuria, flank pain, frequency, hematuria and urgency.  Musculoskeletal:  Negative for back pain, joint pain and myalgias.  Skin:  Negative for rash.  Neurological:  Negative for dizziness, tingling, focal weakness, seizures, weakness and headaches.  Endo/Heme/Allergies:  Does not bruise/bleed easily.  Psychiatric/Behavioral:  Negative for depression and suicidal ideas. The patient does not have insomnia.       Allergies  Allergen Reactions   Geodon [Ziprasidone Hcl] Anaphylaxis     Past Medical History:  Diagnosis Date   Bipolar 1 disorder (HCC)    Colon cancer (HCC)    Lung nodule 09/2022   PTSD (post-traumatic stress disorder)      Past Surgical History:  Procedure Laterality Date   BRONCHIAL NEEDLE ASPIRATION BIOPSY  10/12/2022   Procedure: BRONCHIAL NEEDLE ASPIRATION BIOPSIES;  Surgeon: Raechel Chute, MD;  Location: [facility] ENDOSCOPY;  Service: Pulmonary;;   COLON RESECTION N/A 07/12/2021   Procedure: HAND ASSISTED LAPAROSCOPIC COLON RESECTION;  Surgeon: Leafy Ro, MD;  Location: [facility] ORS;  Service: General;  Laterality: N/A;   COLONOSCOPY WITH PROPOFOL N/A 07/06/2021   Procedure: COLONOSCOPY WITH PROPOFOL;  Surgeon: Toney Reil, MD;  Location: [facility] ENDOSCOPY;  Service: Gastroenterology;  Laterality: N/A;   COLONOSCOPY WITH PROPOFOL N/A 07/07/2021   Procedure: COLONOSCOPY WITH PROPOFOL;  Surgeon: Toney Reil, MD;  Location:  [facility] ENDOSCOPY;  Service: Gastroenterology;  Laterality: N/A;   COLONOSCOPY WITH PROPOFOL N/A 08/02/2022   Procedure: COLONOSCOPY WITH PROPOFOL;  Surgeon: Toney Reil, MD;  Location: [facility] ENDOSCOPY;  Service: Gastroenterology;  Laterality: N/AEarvin Hansen (Caregiver & Transporter) [phone] [name] [phone] will be available for phone consent   ESOPHAGOGASTRODUODENOSCOPY (EGD) WITH PROPOFOL N/A 07/06/2021   Procedure: ESOPHAGOGASTRODUODENOSCOPY (EGD) WITH PROPOFOL;  Surgeon: Toney Reil, MD;  Location: [facility] ENDOSCOPY;  Service: Gastroenterology;  Laterality: N/A;   GIVENS CAPSULE STUDY N/A 07/06/2021   Procedure: GIVENS CAPSULE STUDY;  Surgeon: Toney Reil, MD;  Location: [facility] ENDOSCOPY;  Service: Gastroenterology;  Laterality: N/A;   IR IMAGING GUIDED PORT INSERTION  01/19/2023   IR REMOVAL TUN ACCESS W/ PORT W/O FL MOD SED  02/15/2022   PORTACATH PLACEMENT Right 07/22/2021   Procedure: INSERTION PORT-A-CATH;  Surgeon: Leafy Ro, MD;  Location: [facility] ORS;  Service: General;  Laterality: Right;    Social History   Socioeconomic History   Marital status: Single    Spouse name: Not on file   Number of children: Not on file   Years of education: Not on file   Highest education level: Not on file  Occupational History   Not on file  Tobacco Use   Smoking status: Every Day    Current packs/day: 0.25    Types: Cigarettes   Smokeless tobacco: Current   Tobacco comments:    4 cigarettes a day - khj 09/28/2022  Vaping Use   Vaping status: Every Day  Devices: elfbar  Substance and Sexual Activity   Alcohol use: Not Currently   Drug use: Not Currently   Sexual activity: Not Currently  Other Topics Concern   Not on file  Social History Narrative   Not on file   Social Drivers of Health   Financial Resource Strain: Not on file  Food Insecurity: Not on file  Transportation Needs: Not on file  Physical Activity: Not on file  Stress: Not on file   Social Connections: Not on file  Intimate Partner Violence: Not on file    Family History  Problem Relation Age of Onset   Kidney cancer Father    Breast cancer Maternal Aunt      Current Outpatient Medications:    acetaminophen (TYLENOL) 325 MG tablet, Take 2 tablets (650 mg total) by mouth every 6 (six) hours as needed for mild pain or moderate pain., Disp: 30 tablet, Rfl: 0   ARIPiprazole ER (ABILIFY MAINTENA) 400 MG PRSY prefilled syringe, Inject 400 mg into the muscle every 30 (thirty) days., Disp: , Rfl:    benztropine (COGENTIN) 2 MG tablet, Take 2 mg by mouth at bedtime., Disp: , Rfl:    busPIRone (BUSPAR) 10 MG tablet, Take 10 mg by mouth 2 (two) times daily., Disp: , Rfl:    dexamethasone (DECADRON) 4 MG tablet, Take 2 tablets (8 mg total) by mouth daily. Start the day after chemotherapy for 2 days. Take with food., Disp: 8 tablet, Rfl: 5   FLUoxetine (PROZAC) 20 MG capsule, Take 20 mg by mouth daily., Disp: , Rfl:    ibuprofen (ADVIL) 600 MG tablet, Take 600 mg by mouth every 6 (six) hours as needed for moderate pain., Disp: , Rfl:    ibuprofen (ADVIL) 800 MG tablet, Take 1 tablet (800 mg total) by mouth every 8 (eight) hours as needed., Disp: 30 tablet, Rfl: 0   lidocaine-prilocaine (EMLA) cream, Apply 1 Application topically See admin instructions. Apply to affected area once as directed 1 hour before the pt. Comes to get chemo, Disp: 30 g, Rfl: 2   loperamide (IMODIUM) 2 MG capsule, Take 2 tabs by mouth with first loose stool, then 1 tab with each additional loose stool as needed. Do not exceed 8 tabs in a 24-hour period, Disp: 60 capsule, Rfl: 0   Melatonin 10 MG CAPS, Take 10 mg by mouth at bedtime., Disp: , Rfl:    ondansetron (ZOFRAN) 8 MG tablet, Take 8 mg by mouth 2 (two) times daily as needed for nausea or vomiting., Disp: , Rfl:    ondansetron (ZOFRAN) 8 MG tablet, Take 1 tablet (8 mg total) by mouth every 8 (eight) hours as needed for nausea, vomiting or refractory  nausea / vomiting. Start on the third day after chemotherapy., Disp: 30 tablet, Rfl: 1   pantoprazole (PROTONIX) 20 MG tablet, Take 1 tablet (20 mg total) by mouth daily., Disp: 30 tablet, Rfl: 3   polyethylene glycol (MIRALAX / GLYCOLAX) 17 g packet, Take 17 g by mouth daily as needed (constipation)., Disp: 14 each, Rfl: 0   prazosin (MINIPRESS) 2 MG capsule, Take 2 mg by mouth 2 (two) times daily., Disp: , Rfl:    prochlorperazine (COMPAZINE) 10 MG tablet, Take 10 mg by mouth every 6 (six) hours as needed for nausea or vomiting., Disp: , Rfl:    ARIPiprazole (ABILIFY) 10 MG tablet, Take 10 mg by mouth in the morning. (Patient not taking: Reported on 05/30/2023), Disp: , Rfl:    oxyCODONE (OXY IR/ROXICODONE)  5 MG immediate release tablet, Take 5 mg by mouth every 6 (six) hours as needed for severe pain or breakthrough pain. (Patient not taking: Reported on 03/21/2023), Disp: , Rfl:  No current facility-administered medications for this visit.  Facility-Administered Medications Ordered in Other Visits:    fluorouracil (ADRUCIL) 5,000 mg in sodium chloride 0.9 % 150 mL chemo infusion, 2,400 mg/m2 (Treatment Plan Recorded), Intravenous, 1 day or 1 dose, Creig Hines, MD   fluorouracil (ADRUCIL) chemo injection 850 mg, 400 mg/m2 (Treatment Plan Recorded), Intravenous, Once, Creig Hines, MD   heparin lock flush 100 unit/mL, 500 Units, Intracatheter, Once PRN, Creig Hines, MD   sodium chloride flush (NS) 0.9 % injection 10 mL, 10 mL, Intracatheter, PRN, Creig Hines, MD, 10 mL at 05/30/23 0920  Physical exam:  Vitals:   05/30/23 0856  BP: 124/85  Pulse: (!) 108  Resp: 20  Temp: (!) 97.3 F (36.3 C)  TempSrc: Tympanic  SpO2: 99%   Physical Exam Cardiovascular:     Rate and Rhythm: Normal rate and regular rhythm.     Heart sounds: Normal heart sounds.  Pulmonary:     Effort: Pulmonary effort is normal.     Breath sounds: Normal breath sounds.  Abdominal:     General: Bowel sounds  are normal.     Palpations: Abdomen is soft.  Skin:    General: Skin is warm and dry.  Neurological:     Mental Status: He is alert and oriented to person, place, and time.         Latest Ref Rng & Units 05/30/2023    8:35 AM  CMP  Glucose 70 - 99 mg/dL 95   BUN 6 - 20 mg/dL 15   Creatinine 6.43 - 1.24 mg/dL 3.29   Sodium 518 - 841 mmol/L 138   Potassium 3.5 - 5.1 mmol/L 4.3   Chloride 98 - 111 mmol/L 103   CO2 22 - 32 mmol/L 28   Calcium 8.9 - 10.3 mg/dL 9.8   Total Protein 6.5 - 8.1 g/dL 7.3   Total Bilirubin 0.0 - 1.2 mg/dL 0.4   Alkaline Phos 38 - 126 U/L 61   AST 15 - 41 U/L 23   ALT 0 - 44 U/L 17       Latest Ref Rng & Units 05/30/2023    8:35 AM  CBC  WBC 4.0 - 10.5 K/uL 5.2   Hemoglobin 13.0 - 17.0 g/dL 66.0   Hematocrit 63.0 - 52.0 % 42.6   Platelets 150 - 400 K/uL 232      Assessment and plan- Patient is a 38 y.o. male with metastatic colon cancer here for on treatment assessment prior to cycle 9 of palliative FOLFIRI Avastin chemotherapy  Counts okay to proceed with cycle 9 of FOLFIRI Avastin chemotherapy today.  Blood pressure is stable and urine protein remains trace.  I will see him back in 2 weeks for cycle 10.  Plan to repeat scans after 12 cycles.  Chemo induced nausea: Continue as needed nausea medications   Visit Diagnosis 1. Adenocarcinoma of colon (HCC)   2. Encounter for antineoplastic chemotherapy   3. Encounter for monoclonal antibody treatment for malignancy      Dr. Owens Shark, MD, MPH [facility] [phone] 05/30/2023 12:31 PM

## 2023-05-30 NOTE — Progress Notes (Signed)
ok to proceed with HR 108 per MD

## 2023-05-31 ENCOUNTER — Ambulatory Visit: Payer: MEDICAID

## 2023-05-31 LAB — CEA: CEA: 8.1 ng/mL — ABNORMAL HIGH (ref 0.0–4.7)

## 2023-06-01 ENCOUNTER — Inpatient Hospital Stay: Payer: MEDICAID

## 2023-06-01 DIAGNOSIS — Z5111 Encounter for antineoplastic chemotherapy: Secondary | ICD-10-CM | POA: Diagnosis not present

## 2023-06-01 DIAGNOSIS — C189 Malignant neoplasm of colon, unspecified: Secondary | ICD-10-CM

## 2023-06-01 MED ORDER — HEPARIN SOD (PORK) LOCK FLUSH 100 UNIT/ML IV SOLN
500.0000 [IU] | Freq: Once | INTRAVENOUS | Status: AC | PRN
  Administered 2023-06-01: 500 [IU]
  Filled 2023-06-01: qty 5

## 2023-06-01 MED ORDER — SODIUM CHLORIDE 0.9% FLUSH
10.0000 mL | INTRAVENOUS | Status: DC | PRN
Start: 2023-06-01 — End: 2023-06-01
  Administered 2023-06-01: 10 mL
  Filled 2023-06-01: qty 10

## 2023-06-02 IMAGING — DX DG CHEST 1V PORT
2 series · 2 of 2 positions shown · non-contrast
Comparison: None.

CLINICAL DATA: Port-A-Cath placement.

EXAM:
PORTABLE CHEST 1 VIEW

[chest ap (1 of 2)]
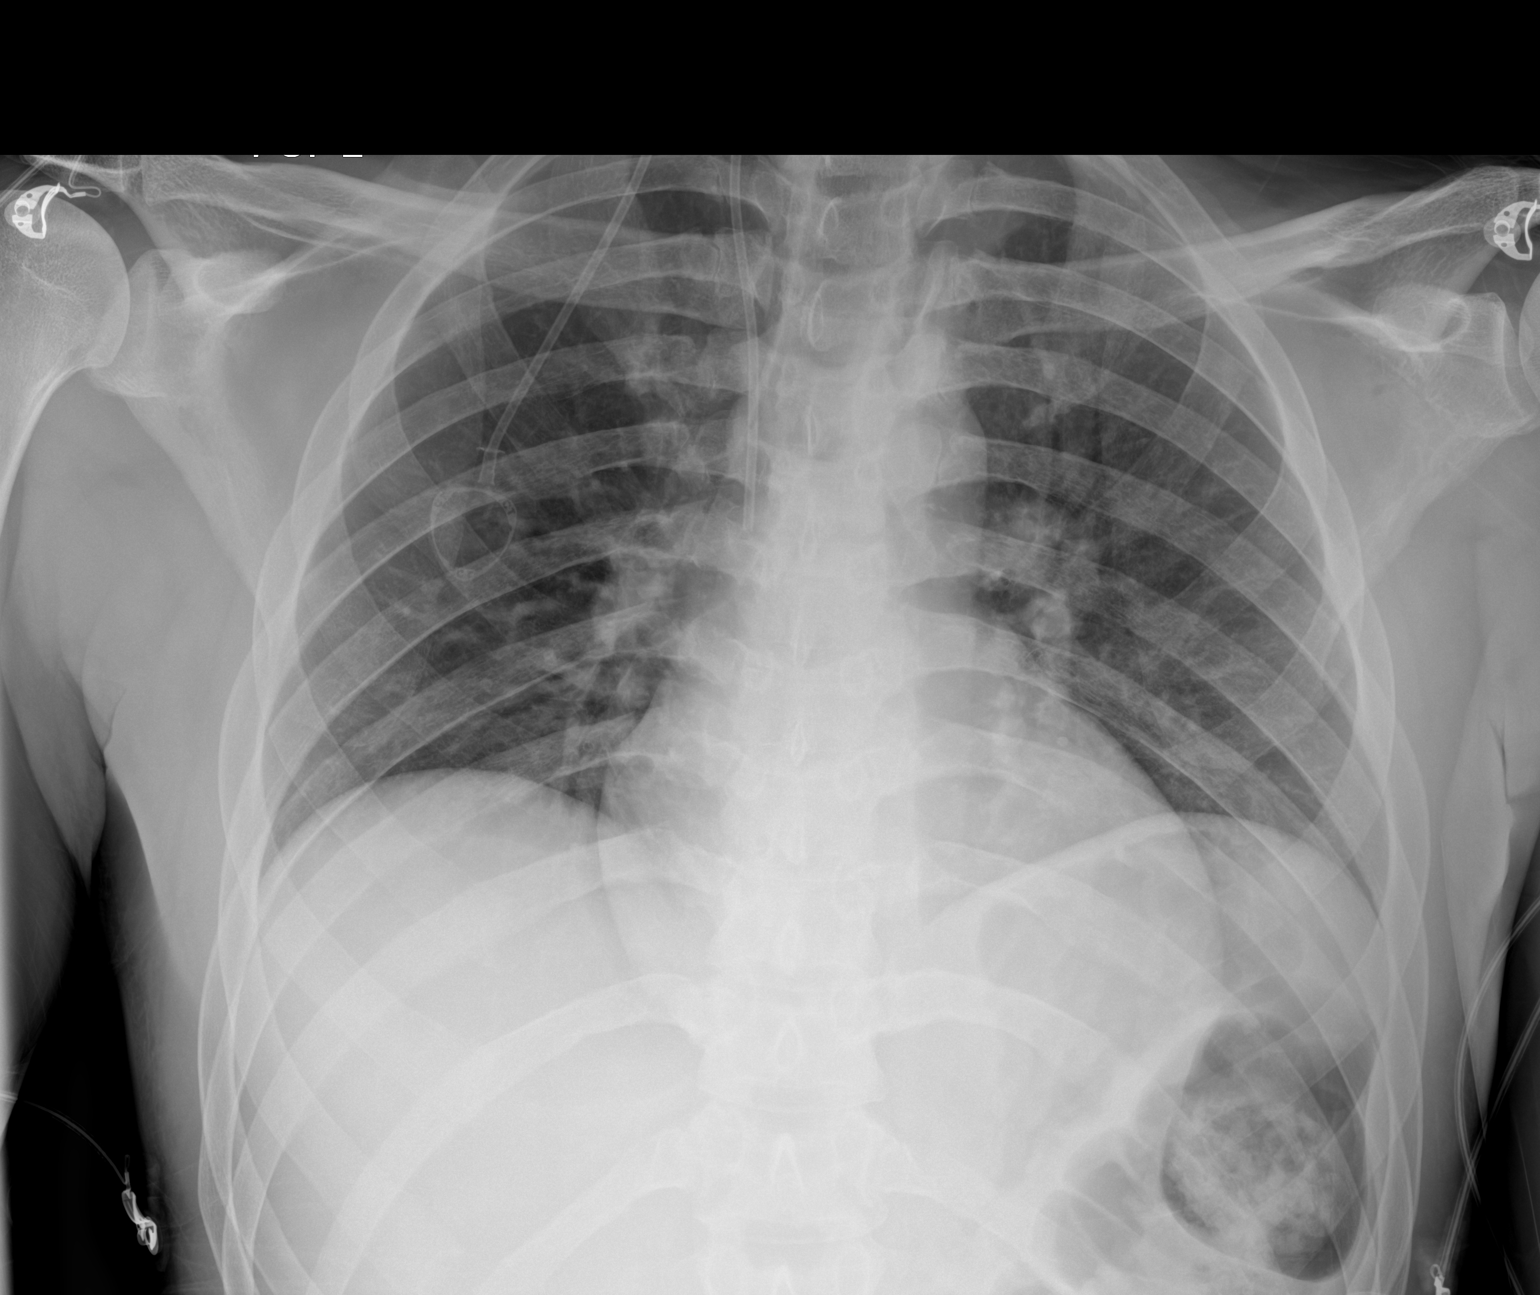

[chest ap (2 of 2)]
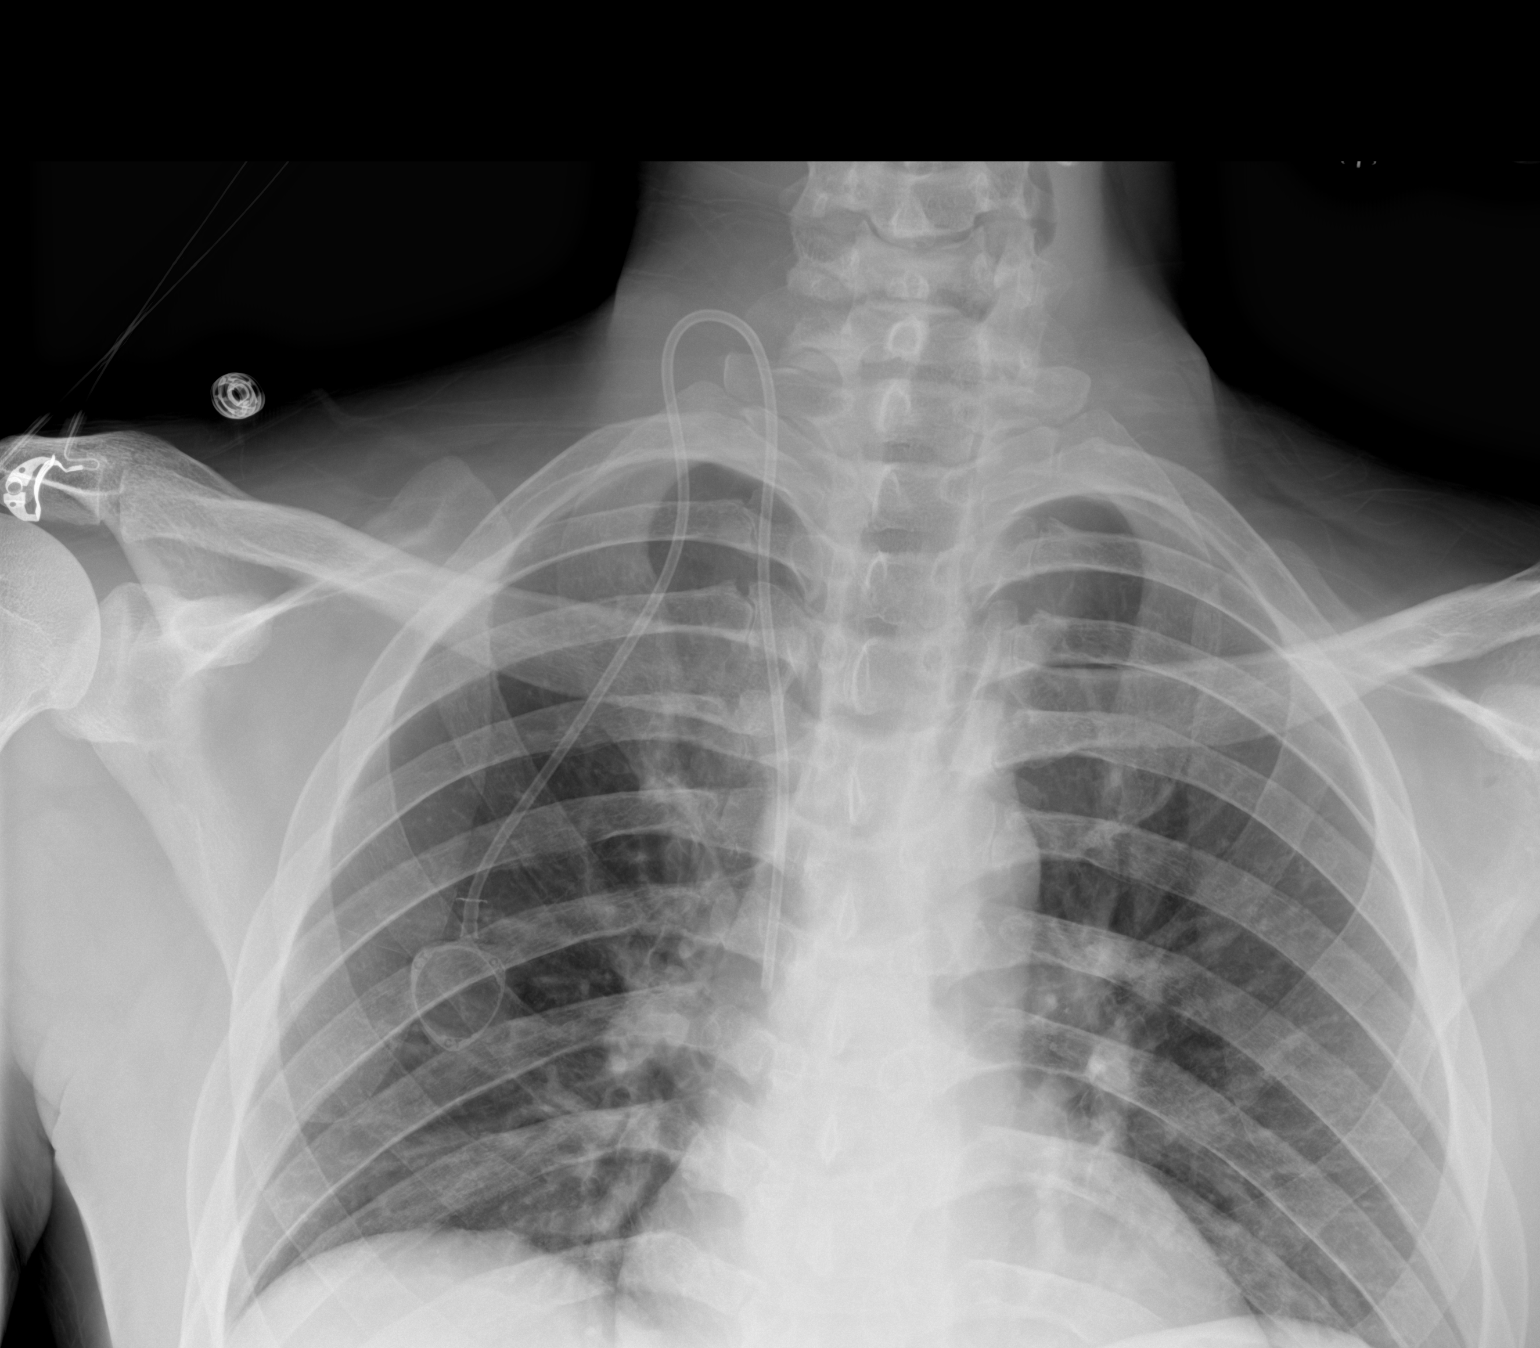

[2 of 2 positions shown; findings below may reference images not displayed]

FINDINGS: 3323 hours. Low lung volumes. Right-sided Port-A-Cath noted with tip
overlying the mid SVC level. No pneumothorax or pleural effusion. No
focal consolidation or pulmonary edema. The cardiopericardial
silhouette is within normal limits for size. The visualized bony
structures of the thorax are unremarkable.
IMPRESSION: Right-sided Port-A-Cath tip overlies the mid SVC level. No
pneumothorax or pleural effusion.

## 2023-06-04 ENCOUNTER — Other Ambulatory Visit: Payer: Self-pay

## 2023-06-04 ENCOUNTER — Ambulatory Visit
Admission: RE | Admit: 2023-06-04 | Discharge: 2023-06-04 | Disposition: A | Discharge status: Home / Self Care | Payer: MEDICAID | Source: Ambulatory Visit | Attending: Radiation Oncology | Admitting: Radiation Oncology

## 2023-06-04 DIAGNOSIS — Z510 Encounter for antineoplastic radiation therapy: Secondary | ICD-10-CM | POA: Diagnosis not present

## 2023-06-04 LAB — RAD ONC ARIA SESSION SUMMARY
Course Elapsed Days: 0
Plan Fractions Treated to Date: 1
Plan Prescribed Dose Per Fraction: 12 Gy
Plan Total Fractions Prescribed: 5
Plan Total Prescribed Dose: 60 Gy
Reference Point Dosage Given to Date: 12 Gy
Reference Point Session Dosage Given: 12 Gy
Session Number: 1

## 2023-06-05 ENCOUNTER — Ambulatory Visit: Payer: MEDICAID

## 2023-06-05 ENCOUNTER — Other Ambulatory Visit: Payer: Self-pay | Admitting: Oncology

## 2023-06-06 ENCOUNTER — Other Ambulatory Visit: Payer: Self-pay

## 2023-06-06 ENCOUNTER — Ambulatory Visit
Admission: RE | Admit: 2023-06-06 | Discharge: 2023-06-06 | Disposition: A | Discharge status: Home / Self Care | Payer: MEDICAID | Source: Ambulatory Visit | Attending: Radiation Oncology | Admitting: Radiation Oncology

## 2023-06-06 ENCOUNTER — Ambulatory Visit: Payer: MEDICAID

## 2023-06-06 DIAGNOSIS — Z510 Encounter for antineoplastic radiation therapy: Secondary | ICD-10-CM | POA: Diagnosis not present

## 2023-06-06 LAB — RAD ONC ARIA SESSION SUMMARY
Course Elapsed Days: 2
Plan Fractions Treated to Date: 2
Plan Prescribed Dose Per Fraction: 12 Gy
Plan Total Fractions Prescribed: 5
Plan Total Prescribed Dose: 60 Gy
Reference Point Dosage Given to Date: 24 Gy
Reference Point Session Dosage Given: 12 Gy
Session Number: 2

## 2023-06-07 ENCOUNTER — Ambulatory Visit: Payer: MEDICAID

## 2023-06-08 ENCOUNTER — Ambulatory Visit: Payer: MEDICAID

## 2023-06-12 ENCOUNTER — Ambulatory Visit
Admission: RE | Admit: 2023-06-12 | Discharge: 2023-06-12 | Disposition: A | Discharge status: Home / Self Care | Payer: MEDICAID | Source: Ambulatory Visit | Attending: Radiation Oncology | Admitting: Radiation Oncology

## 2023-06-12 ENCOUNTER — Other Ambulatory Visit: Payer: Self-pay

## 2023-06-12 DIAGNOSIS — Z510 Encounter for antineoplastic radiation therapy: Secondary | ICD-10-CM | POA: Diagnosis not present

## 2023-06-12 LAB — RAD ONC ARIA SESSION SUMMARY
Course Elapsed Days: 8
Plan Fractions Treated to Date: 3
Plan Prescribed Dose Per Fraction: 12 Gy
Plan Total Fractions Prescribed: 5
Plan Total Prescribed Dose: 60 Gy
Reference Point Dosage Given to Date: 36 Gy
Reference Point Session Dosage Given: 12 Gy
Session Number: 3

## 2023-06-13 ENCOUNTER — Inpatient Hospital Stay: Payer: MEDICAID

## 2023-06-13 ENCOUNTER — Inpatient Hospital Stay (HOSPITAL_BASED_OUTPATIENT_CLINIC_OR_DEPARTMENT_OTHER): Payer: MEDICAID | Admitting: Oncology

## 2023-06-13 ENCOUNTER — Telehealth: Payer: Self-pay

## 2023-06-13 ENCOUNTER — Encounter: Payer: Self-pay | Admitting: Oncology

## 2023-06-13 VITALS — BP 140/85 | HR 79 | Resp 16

## 2023-06-13 DIAGNOSIS — C189 Malignant neoplasm of colon, unspecified: Secondary | ICD-10-CM

## 2023-06-13 DIAGNOSIS — Z5112 Encounter for antineoplastic immunotherapy: Secondary | ICD-10-CM | POA: Diagnosis not present

## 2023-06-13 DIAGNOSIS — Z5111 Encounter for antineoplastic chemotherapy: Secondary | ICD-10-CM

## 2023-06-13 LAB — CMP (CANCER CENTER ONLY)
ALT: 20 U/L (ref 0–44)
AST: 29 U/L (ref 15–41)
Albumin: 4 g/dL (ref 3.5–5.0)
Alkaline Phosphatase: 58 U/L (ref 38–126)
Anion gap: 9 (ref 5–15)
BUN: 10 mg/dL (ref 6–20)
CO2: 24 mmol/L (ref 22–32)
Calcium: 9 mg/dL (ref 8.9–10.3)
Chloride: 103 mmol/L (ref 98–111)
Creatinine: 1.05 mg/dL (ref 0.61–1.24)
GFR, Estimated: >60 mL/min (ref >60)
Glucose, Bld: 89 mg/dL (ref 70–99)
Potassium: 4 mmol/L (ref 3.5–5.1)
Sodium: 136 mmol/L (ref 135–145)
Total Bilirubin: 0.6 mg/dL (ref 0.0–1.2)
Total Protein: 7.2 g/dL (ref 6.5–8.1)

## 2023-06-13 LAB — CBC WITH DIFFERENTIAL (CANCER CENTER ONLY)
Abs Immature Granulocytes: 0.01 10*3/uL (ref 0.00–0.07)
Basophils Absolute: 0 10*3/uL (ref 0.0–0.1)
Basophils Relative: 0 %
Eosinophils Absolute: 0.1 10*3/uL (ref 0.0–0.5)
Eosinophils Relative: 3 %
HCT: 41.1 % (ref 39.0–52.0)
Hemoglobin: 13.4 g/dL (ref 13.0–17.0)
Immature Granulocytes: 0 %
Lymphocytes Relative: 20 %
Lymphs Abs: 0.7 10*3/uL (ref 0.7–4.0)
MCH: 30.6 pg (ref 26.0–34.0)
MCHC: 32.6 g/dL (ref 30.0–36.0)
MCV: 93.8 fL (ref 80.0–100.0)
Monocytes Absolute: 0.6 10*3/uL (ref 0.1–1.0)
Monocytes Relative: 16 %
Neutro Abs: 2.1 10*3/uL (ref 1.7–7.7)
Neutrophils Relative %: 61 %
Platelet Count: 192 10*3/uL (ref 150–400)
RBC: 4.38 MIL/uL (ref 4.22–5.81)
RDW: 13.7 % (ref 11.5–15.5)
WBC Count: 3.5 10*3/uL — ABNORMAL LOW (ref 4.0–10.5)
nRBC: 0 % (ref 0.0–0.2)

## 2023-06-13 LAB — PROTEIN, URINE, RANDOM: Total Protein, Urine: 40 mg/dL

## 2023-06-13 LAB — TOTAL PROTEIN, URINE DIPSTICK: Protein, ur: 30 mg/dL — AB

## 2023-06-13 MED ORDER — SODIUM CHLORIDE 0.9 % IV SOLN
Freq: Once | INTRAVENOUS | Status: AC
  Filled 2023-06-13: qty 250

## 2023-06-13 MED ORDER — SODIUM CHLORIDE 0.9 % IV SOLN
5.0000 mg/kg | Freq: Once | INTRAVENOUS | Status: AC
  Administered 2023-06-13: 500 mg via INTRAVENOUS
  Filled 2023-06-13: qty 16

## 2023-06-13 MED ORDER — SODIUM CHLORIDE 0.9 % IV SOLN
2400.0000 mg/m2 | INTRAVENOUS | Status: DC
  Administered 2023-06-13: 5000 mg via INTRAVENOUS
  Filled 2023-06-13: qty 100

## 2023-06-13 MED ORDER — ATROPINE SULFATE 1 MG/ML IV SOLN
0.5000 mg | Freq: Once | INTRAVENOUS | Status: AC | PRN
  Administered 2023-06-13: 0.5 mg via INTRAVENOUS
  Filled 2023-06-13: qty 1

## 2023-06-13 MED ORDER — FLUOROURACIL CHEMO INJECTION 2.5 GM/50ML
400.0000 mg/m2 | Freq: Once | INTRAVENOUS | Status: AC
  Administered 2023-06-13: 850 mg via INTRAVENOUS
  Filled 2023-06-13: qty 17

## 2023-06-13 MED ORDER — PALONOSETRON HCL INJECTION 0.25 MG/5ML
0.2500 mg | Freq: Once | INTRAVENOUS | Status: AC
  Administered 2023-06-13: 0.25 mg via INTRAVENOUS
  Filled 2023-06-13: qty 5

## 2023-06-13 MED ORDER — SODIUM CHLORIDE 0.9 % IV SOLN
150.0000 mg/m2 | Freq: Once | INTRAVENOUS | Status: AC
  Administered 2023-06-13: 320 mg via INTRAVENOUS
  Filled 2023-06-13: qty 16

## 2023-06-13 MED ORDER — DEXAMETHASONE SODIUM PHOSPHATE 10 MG/ML IJ SOLN
10.0000 mg | Freq: Once | INTRAMUSCULAR | Status: AC
  Administered 2023-06-13: 10 mg via INTRAVENOUS
  Filled 2023-06-13: qty 1

## 2023-06-13 MED ORDER — LEUCOVORIN CALCIUM INJECTION 350 MG
400.0000 mg/m2 | Freq: Once | INTRAMUSCULAR | Status: AC
  Administered 2023-06-13: 856 mg via INTRAVENOUS
  Filled 2023-06-13: qty 42.8

## 2023-06-13 NOTE — Patient Instructions (Signed)
 [facility] - A DEPT OF [facility]  Discharge Instructions: Thank you for choosing [facility] to provide your oncology and hematology care.  If you have a lab appointment with the Cancer Center, please go directly to the Cancer Center and check in at the registration area.  Wear comfortable clothing and clothing appropriate for easy access to any Portacath or PICC line.   We strive to give you quality time with your provider. You may need to reschedule your appointment if you arrive late (15 or more minutes).  Arriving late affects you and other patients whose appointments are after yours.  Also, if you miss three or more appointments without notifying the office, you may be dismissed from the clinic at the provider's discretion.      For prescription refill requests, have your pharmacy contact our office and allow 72 hours for refills to be completed.    Today you received the following chemotherapy and/or immunotherapy agents: bevacizumab/ fluorouracil & irinotecan & leucovorin    To help prevent nausea and vomiting after your treatment, we encourage you to take your nausea medication as directed.  BELOW ARE SYMPTOMS THAT SHOULD BE REPORTED IMMEDIATELY: *FEVER GREATER THAN 100.4 F (38 C) OR HIGHER *CHILLS OR SWEATING *NAUSEA AND VOMITING THAT IS NOT CONTROLLED WITH YOUR NAUSEA MEDICATION *UNUSUAL SHORTNESS OF BREATH *UNUSUAL BRUISING OR BLEEDING *URINARY PROBLEMS (pain or burning when urinating, or frequent urination) *BOWEL PROBLEMS (unusual diarrhea, constipation, pain near the anus) TENDERNESS IN MOUTH AND THROAT WITH OR WITHOUT PRESENCE OF ULCERS (sore throat, sores in mouth, or a toothache) UNUSUAL RASH, SWELLING OR PAIN  UNUSUAL VAGINAL DISCHARGE OR ITCHING   Items with * indicate a potential emergency and should be followed up as soon as possible or go to the Emergency Department if any problems should occur.  Please show the  CHEMOTHERAPY ALERT CARD or IMMUNOTHERAPY ALERT CARD at check-in to the Emergency Department and triage nurse.  Should you have questions after your visit or need to cancel or reschedule your appointment, please contact [facility] - A DEPT OF [facility]  [phone] and follow the prompts.  Office hours are 8:00 a.m. to 4:30 p.m. Monday - Friday. Please note that voicemails left after 4:00 p.m. may not be returned until the following business day.  We are closed weekends and major holidays. You have access to a nurse at all times for urgent questions. Please call the main number to the clinic [phone] and follow the prompts.  For any non-urgent questions, you may also contact your provider using MyChart. We now offer e-Visits for anyone 72 and older to request care online for non-urgent symptoms. For details visit [email or web address].   Also download the MyChart app! Go to the app store, search "MyChart", open the app, select [place], and log in with your MyChart username and password.

## 2023-06-13 NOTE — Progress Notes (Signed)
 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  He was found to have a hemoglobin of 4.5/18.7 on admission.  He received blood transfusion as well as IV iron and his hemoglobin is presently up to an 8.  He underwent colonoscopy which showed malignant partially obstructing tumor that was circumferential 80 cm proximal to the anus.  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  CT chest abdomen and pelvis with contrast did not show any evidence of distant metastatic disease.  2 small sclerotic densities in the acetabulum and proximal right femur likely benign process.  Pathologically enlarged lymph nodes superior to circumferential colonic lesion suggesting metastatic adenopathy.   Patient underwent right hemicolectomy with Dr. Everlene Farrier on 07/12/2021.  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   Vermiform appendix negative for tumor.  Radial margin positive for tumor (2 lymph nodes at margin with adenocarcinoma).  Proximal and distal margins negative.  7 out of 56 lymph nodes positive for metastatic adenocarcinoma.  Tumor deposits not applicable.  No lymphovascular or perineural invasion seen.  PT4APN2B.  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  BRAF testing was negative.  Genetic testing was offered to the patient but he declined   Fertility conservation was also discussed an option of sperm banking at [facility] was discussed with the patient and his healthcare power of attorney [name].  There would be a one-time cost involved for sperm banking and a yearly cost for preserving the specimen.  Overall chances of infertility with FOLFOX is low but possible.  Patient and his healthcare power of attorney have discussed their options and have decided not to proceed with fertility conservation at this time   Patient completed 12 cycles of adjuvant FOLFOX chemotherapy in followed by adjuvant radiation therapy in September 2023    CT scan and PET scan showed concern for metastatic disease.  18 x 16 mm left lower lobe lung nodule with an SUV of 2.4.  Progressive abdominal/retroperitoneal adenopathy with lymph nodes measuring between 6 mm to 18 mm which were PET avid.  Lung biopsy was negative for malignancy.  Retroperitoneal adenopathy was biopsied and was consistent with mucinous adenocarcinoma of colon primary.  Tissue Tempus testing ATM, BRCA2, PIK3CA mutation.  MLH1 mutation. No evidence of K-ras BRAF or NRAS mutation.  Tumor mutational burden high 95th percentile MSI status could not be assessed because the tumor content is below 30%    Interval history-occasional episodes of nosebleed following chemotherapy.  Denies any significant diarrhea  ECOG PS- 0 Pain scale- 0   Review of systems- Review of Systems  Constitutional:   Negative for chills, fever, malaise/fatigue and weight loss.  HENT:  Negative for congestion, ear discharge and nosebleeds.   Eyes:  Negative for blurred vision.  Respiratory:  Negative for cough, hemoptysis, sputum production, shortness of breath and wheezing.   Cardiovascular:  Negative for chest pain, palpitations, orthopnea and claudication.  Gastrointestinal:  Negative for abdominal pain, blood in stool, constipation, diarrhea, heartburn, melena, nausea and vomiting.  Genitourinary:  Negative for dysuria, flank pain, frequency, hematuria and urgency.  Musculoskeletal:  Negative for back pain, joint pain and myalgias.  Skin:  Negative for rash.  Neurological:  Negative for dizziness, tingling, focal weakness, seizures, weakness and headaches.  Endo/Heme/Allergies:  Does not bruise/bleed easily.  Psychiatric/Behavioral:  Negative for depression and suicidal ideas. The patient does not have insomnia.       Allergies  Allergen Reactions   Geodon [Ziprasidone Hcl] Anaphylaxis     Past Medical History:  Diagnosis Date   Bipolar 1 disorder (HCC)    Colon cancer (HCC)    Lung nodule 09/2022   PTSD (post-traumatic stress disorder)      Past Surgical History:  Procedure Laterality Date   BRONCHIAL NEEDLE ASPIRATION BIOPSY  10/12/2022   Procedure: BRONCHIAL NEEDLE ASPIRATION BIOPSIES;  Surgeon: Raechel Chute, MD;  Location: [facility] ENDOSCOPY;  Service: Pulmonary;;   COLON RESECTION N/A 07/12/2021   Procedure: HAND ASSISTED LAPAROSCOPIC COLON RESECTION;  Surgeon: Leafy Ro, MD;  Location: [facility] ORS;  Service: General;  Laterality: N/A;   COLONOSCOPY WITH PROPOFOL N/A 07/06/2021   Procedure: COLONOSCOPY WITH PROPOFOL;  Surgeon: Toney Reil, MD;  Location: [facility] ENDOSCOPY;  Service: Gastroenterology;  Laterality: N/A;   COLONOSCOPY WITH PROPOFOL N/A 07/07/2021   Procedure: COLONOSCOPY WITH PROPOFOL;  Surgeon: Toney Reil, MD;  Location: [facility] ENDOSCOPY;  Service:  Gastroenterology;  Laterality: N/A;   COLONOSCOPY WITH PROPOFOL N/A 08/02/2022   Procedure: COLONOSCOPY WITH PROPOFOL;  Surgeon: Toney Reil, MD;  Location: [facility] ENDOSCOPY;  Service: Gastroenterology;  Laterality: N/AEarvin Hansen (Caregiver & Transporter) [phone] [name] [phone] will be available for phone consent   ESOPHAGOGASTRODUODENOSCOPY (EGD) WITH PROPOFOL N/A 07/06/2021   Procedure: ESOPHAGOGASTRODUODENOSCOPY (EGD) WITH PROPOFOL;  Surgeon: Toney Reil, MD;  Location: [facility] ENDOSCOPY;  Service: Gastroenterology;  Laterality: N/A;   GIVENS CAPSULE STUDY N/A 07/06/2021   Procedure: GIVENS CAPSULE STUDY;  Surgeon: Toney Reil, MD;  Location: [facility] ENDOSCOPY;  Service: Gastroenterology;  Laterality: N/A;   IR IMAGING GUIDED PORT INSERTION  01/19/2023   IR REMOVAL TUN ACCESS W/ PORT W/O FL MOD SED  02/15/2022   PORTACATH PLACEMENT Right 07/22/2021   Procedure: INSERTION PORT-A-CATH;  Surgeon: Leafy Ro, MD;  Location: [facility] ORS;  Service: General;  Laterality: Right;    Social History   Socioeconomic History   Marital status: Single    Spouse name: Not on file   Number of children: Not on file   Years of education: Not on file   Highest education level: Not on file  Occupational History   Not on file  Tobacco Use   Smoking status: Every Day    Current packs/day: 0.25    Types: Cigarettes   Smokeless tobacco: Current   Tobacco comments:    4 cigarettes a day - khj 09/28/2022  Vaping Use   Vaping status: Every Day   Devices: elfbar  Substance and Sexual Activity   Alcohol use: Not  Currently   Drug use: Not Currently   Sexual activity: Not Currently  Other Topics Concern   Not on file  Social History Narrative   Not on file   Social Drivers of Health   Financial Resource Strain: Not on file  Food Insecurity: Not on file  Transportation Needs: Not on file  Physical Activity: Not on file  Stress: Not on file  Social Connections: Not  on file  Intimate Partner Violence: Not on file    Family History  Problem Relation Age of Onset   Kidney cancer Father    Breast cancer Maternal Aunt      Current Outpatient Medications:    acetaminophen (TYLENOL) 325 MG tablet, Take 2 tablets (650 mg total) by mouth every 6 (six) hours as needed for mild pain or moderate pain., Disp: 30 tablet, Rfl: 0   ARIPiprazole (ABILIFY) 10 MG tablet, Take 10 mg by mouth in the morning. (Patient not taking: Reported on 05/30/2023), Disp: , Rfl:    ARIPiprazole ER (ABILIFY MAINTENA) 400 MG PRSY prefilled syringe, Inject 400 mg into the muscle every 30 (thirty) days., Disp: , Rfl:    benztropine (COGENTIN) 2 MG tablet, Take 2 mg by mouth at bedtime., Disp: , Rfl:    busPIRone (BUSPAR) 10 MG tablet, Take 10 mg by mouth 2 (two) times daily., Disp: , Rfl:    dexamethasone (DECADRON) 4 MG tablet, Take 2 tablets (8 mg total) by mouth daily. Start the day after chemotherapy for 2 days. Take with food., Disp: 8 tablet, Rfl: 5   FLUoxetine (PROZAC) 20 MG capsule, Take 20 mg by mouth daily., Disp: , Rfl:    ibuprofen (ADVIL) 600 MG tablet, Take 600 mg by mouth every 6 (six) hours as needed for moderate pain., Disp: , Rfl:    ibuprofen (ADVIL) 800 MG tablet, Take 1 tablet (800 mg total) by mouth every 8 (eight) hours as needed., Disp: 30 tablet, Rfl: 0   lidocaine-prilocaine (EMLA) cream, Apply 1 Application topically See admin instructions. Apply to affected area once as directed 1 hour before the pt. Comes to get chemo, Disp: 30 g, Rfl: 2   loperamide (IMODIUM) 2 MG capsule, Take 2 tabs by mouth with first loose stool, then 1 tab with each additional loose stool as needed. Do not exceed 8 tabs in a 24-hour period, Disp: 60 capsule, Rfl: 0   Melatonin 10 MG CAPS, Take 10 mg by mouth at bedtime., Disp: , Rfl:    ondansetron (ZOFRAN) 8 MG tablet, Take 8 mg by mouth 2 (two) times daily as needed for nausea or vomiting., Disp: , Rfl:    ondansetron (ZOFRAN) 8 MG  tablet, Take 1 tablet (8 mg total) by mouth every 8 (eight) hours as needed for nausea, vomiting or refractory nausea / vomiting. Start on the third day after chemotherapy., Disp: 30 tablet, Rfl: 1   oxyCODONE (OXY IR/ROXICODONE) 5 MG immediate release tablet, Take 5 mg by mouth every 6 (six) hours as needed for severe pain or breakthrough pain. (Patient not taking: Reported on 03/21/2023), Disp: , Rfl:    pantoprazole (PROTONIX) 20 MG tablet, TAKE 1 TABLET BY MOUTH ONCE DAILY *DO NOT CRUSH OR CHEW*, Disp: 24 tablet, Rfl: 10   polyethylene glycol (MIRALAX / GLYCOLAX) 17 g packet, Take 17 g by mouth daily as needed (constipation)., Disp: 14 each, Rfl: 0   prazosin (MINIPRESS) 2 MG capsule, Take 2 mg by mouth 2 (two) times daily., Disp: , Rfl:    prochlorperazine (  COMPAZINE) 10 MG tablet, Take 10 mg by mouth every 6 (six) hours as needed for nausea or vomiting., Disp: , Rfl:  No current facility-administered medications for this visit.  Facility-Administered Medications Ordered in Other Visits:    fluorouracil (ADRUCIL) 5,000 mg in sodium chloride 0.9 % 150 mL chemo infusion, 2,400 mg/m2 (Treatment Plan Recorded), Intravenous, 1 day or 1 dose, Creig Hines, MD   fluorouracil (ADRUCIL) chemo injection 850 mg, 400 mg/m2 (Treatment Plan Recorded), Intravenous, Once, Creig Hines, MD   irinotecan (CAMPTOSAR) 320 mg in sodium chloride 0.9 % 500 mL chemo infusion, 150 mg/m2 (Treatment Plan Recorded), Intravenous, Once, Creig Hines, MD, Last Rate: 344 mL/hr at 06/13/23 1156, 320 mg at 06/13/23 1156   leucovorin 856 mg in sodium chloride 0.9 % 250 mL infusion, 400 mg/m2 (Treatment Plan Recorded), Intravenous, Once, Creig Hines, MD, Last Rate: 195 mL/hr at 06/13/23 1155, 856 mg at 06/13/23 1155  Physical exam: There were no vitals filed for this visit. Physical Exam Cardiovascular:     Rate and Rhythm: Normal rate and regular rhythm.     Heart sounds: Normal heart sounds.  Pulmonary:     Effort:  Pulmonary effort is normal.     Breath sounds: Normal breath sounds.  Skin:    General: Skin is warm and dry.  Neurological:     Mental Status: He is alert and oriented to person, place, and time.         Latest Ref Rng & Units 06/13/2023   10:10 AM  CMP  Glucose 70 - 99 mg/dL 89   BUN 6 - 20 mg/dL 10   Creatinine 0.98 - 1.24 mg/dL 1.19   Sodium 147 - 829 mmol/L 136   Potassium 3.5 - 5.1 mmol/L 4.0   Chloride 98 - 111 mmol/L 103   CO2 22 - 32 mmol/L 24   Calcium 8.9 - 10.3 mg/dL 9.0   Total Protein 6.5 - 8.1 g/dL 7.2   Total Bilirubin 0.0 - 1.2 mg/dL 0.6   Alkaline Phos 38 - 126 U/L 58   AST 15 - 41 U/L 29   ALT 0 - 44 U/L 20       Latest Ref Rng & Units 06/13/2023   10:10 AM  CBC  WBC 4.0 - 10.5 K/uL 3.5   Hemoglobin 13.0 - 17.0 g/dL 56.2   Hematocrit 13.0 - 52.0 % 41.1   Platelets 150 - 400 K/uL 192      Assessment and plan- Patient is a 38 y.o. male with metastatic colon cancer here for on treatment assessment prior to cycle 10 of palliative FOLFIRI a Avastin chemotherapy  Counts okay to proceed with cycle 10 of palliative FOLFIRI Avastin chemotherapy today.  Urine protein is 30 which would be considered as +1.  If it exceeds 100 I will plan to hold off on giving him a Avastin.  I will see him back in 3 weeks for cycle 11 and plan for scans after 12 cycles.  He is already received palliative radiation to the 2 lung nodules which are growing.  We could consider giving him a temporary treatment break if he has stable disease after 12 cycles of treatment   Visit Diagnosis 1. Adenocarcinoma of colon (HCC)   2. Encounter for antineoplastic chemotherapy   3. Encounter for monoclonal antibody treatment for malignancy      Dr. Owens Shark, MD, MPH [facility] [phone] 06/13/2023 12:48 PM

## 2023-06-13 NOTE — Telephone Encounter (Signed)
 Called multiple numbers in chart regarding missed appoiment. No answer. Spoke to [employer], driver he states he is not sure why he missed his apt today

## 2023-06-13 NOTE — Patient Instructions (Signed)

## 2023-06-14 ENCOUNTER — Ambulatory Visit
Admission: RE | Admit: 2023-06-14 | Discharge: 2023-06-14 | Disposition: A | Discharge status: Home / Self Care | Payer: MEDICAID | Source: Ambulatory Visit | Attending: Radiation Oncology | Admitting: Radiation Oncology

## 2023-06-14 ENCOUNTER — Other Ambulatory Visit: Payer: Self-pay

## 2023-06-14 DIAGNOSIS — Z510 Encounter for antineoplastic radiation therapy: Secondary | ICD-10-CM | POA: Diagnosis not present

## 2023-06-14 LAB — RAD ONC ARIA SESSION SUMMARY
Course Elapsed Days: 10
Plan Fractions Treated to Date: 4
Plan Prescribed Dose Per Fraction: 12 Gy
Plan Total Fractions Prescribed: 5
Plan Total Prescribed Dose: 60 Gy
Reference Point Dosage Given to Date: 48 Gy
Reference Point Session Dosage Given: 12 Gy
Session Number: 4

## 2023-06-14 LAB — CEA: CEA: 6.2 ng/mL — ABNORMAL HIGH (ref 0.0–4.7)

## 2023-06-15 ENCOUNTER — Inpatient Hospital Stay: Payer: MEDICAID

## 2023-06-15 VITALS — BP 135/89 | HR 90 | Resp 18

## 2023-06-15 DIAGNOSIS — Z5111 Encounter for antineoplastic chemotherapy: Secondary | ICD-10-CM | POA: Diagnosis not present

## 2023-06-15 DIAGNOSIS — C189 Malignant neoplasm of colon, unspecified: Secondary | ICD-10-CM

## 2023-06-15 MED ORDER — SODIUM CHLORIDE 0.9% FLUSH
10.0000 mL | INTRAVENOUS | Status: DC | PRN
  Administered 2023-06-15: 10 mL
  Filled 2023-06-15: qty 10

## 2023-06-15 MED ORDER — HEPARIN SOD (PORK) LOCK FLUSH 100 UNIT/ML IV SOLN
500.0000 [IU] | Freq: Once | INTRAVENOUS | Status: AC | PRN
  Administered 2023-06-15: 500 [IU]
  Filled 2023-06-15: qty 5

## 2023-06-19 ENCOUNTER — Other Ambulatory Visit: Payer: Self-pay

## 2023-06-19 ENCOUNTER — Ambulatory Visit
Admission: RE | Admit: 2023-06-19 | Discharge: 2023-06-19 | Disposition: A | Discharge status: Home / Self Care | Payer: MEDICAID | Source: Ambulatory Visit | Attending: Radiation Oncology | Admitting: Radiation Oncology

## 2023-06-19 DIAGNOSIS — Z510 Encounter for antineoplastic radiation therapy: Secondary | ICD-10-CM | POA: Insufficient documentation

## 2023-06-19 DIAGNOSIS — C7802 Secondary malignant neoplasm of left lung: Secondary | ICD-10-CM | POA: Diagnosis not present

## 2023-06-19 DIAGNOSIS — C184 Malignant neoplasm of transverse colon: Secondary | ICD-10-CM | POA: Insufficient documentation

## 2023-06-19 LAB — RAD ONC ARIA SESSION SUMMARY
Course Elapsed Days: 15
Plan Fractions Treated to Date: 5
Plan Prescribed Dose Per Fraction: 12 Gy
Plan Total Fractions Prescribed: 5
Plan Total Prescribed Dose: 60 Gy
Reference Point Dosage Given to Date: 60 Gy
Reference Point Session Dosage Given: 12 Gy
Session Number: 5

## 2023-06-20 ENCOUNTER — Other Ambulatory Visit: Payer: Self-pay

## 2023-06-20 NOTE — Radiation Completion Notes (Signed)
 Patient Name: [name], Connor Wilkinson MRN: [id number] Date of Birth: 21-Jun-1985 Referring Physician: [name], M.D. Date of Service: 2023-06-20 Radiation Oncologist: Carmina Miller, M.D. [facility]                             RADIATION ONCOLOGY END OF TREATMENT NOTE     Diagnosis: R91.8 Other nonspecific abnormal finding of lung field Staging on 2021-07-21: Adenocarcinoma of colon (HCC) T=pT4a, N=pN2b, M=cM0 Intent: Curative     HPI: Patient is a 38 year old male well-known to our department having been treated back in September 23.  He received adjuvant radiation therapy status post resection of a stage IIIc adenocarcinoma the transverse and ascending colon and received adjuvant FOLFOX chemotherapy along with concurrent chemoradiation therapy.  His initial tumor was 11.5 cm long grade 3 and invaded into the pericolonic adipose tissue.  7 of 56 lymph nodes were positive for metastatic disease      ==========DELIVERED PLANS==========  First Treatment Date: 2023-06-04 Last Treatment Date: 2023-06-19   Plan Name: Lung_L_SBRT Site: Lung, Left Technique: SBRT/SRT-IMRT Mode: Photon Dose Per Fraction: 12 Gy Prescribed Dose (Delivered / Prescribed): 60 Gy / 60 Gy Prescribed Fxs (Delivered / Prescribed): 5 / 5     ==========ON TREATMENT VISIT DATES========== 2023-06-04, 2023-06-06, 2023-06-12, 2023-06-12, 2023-06-14, 2023-06-19     ==========UPCOMING VISITS==========       ==========APPENDIX - ON TREATMENT VISIT NOTES==========   See weekly On Treatment Notes in Epic for details in the Media tab (listed as Progress notes on the On Treatment Visit Dates listed above).

## 2023-06-25 ENCOUNTER — Telehealth: Payer: Self-pay

## 2023-06-25 NOTE — Telephone Encounter (Signed)
 Spoke with patient's legal guardian and provided plan of treatment per Dr. Assunta Gambles last office visit note.

## 2023-07-03 ENCOUNTER — Other Ambulatory Visit: Payer: Self-pay

## 2023-07-04 ENCOUNTER — Inpatient Hospital Stay: Payer: MEDICAID

## 2023-07-04 ENCOUNTER — Other Ambulatory Visit: Payer: Self-pay | Admitting: Oncology

## 2023-07-04 ENCOUNTER — Inpatient Hospital Stay: Payer: MEDICAID | Admitting: Oncology

## 2023-07-04 DIAGNOSIS — C189 Malignant neoplasm of colon, unspecified: Secondary | ICD-10-CM

## 2023-07-05 ENCOUNTER — Other Ambulatory Visit: Payer: Self-pay

## 2023-07-06 ENCOUNTER — Inpatient Hospital Stay: Payer: MEDICAID

## 2023-07-11 ENCOUNTER — Encounter: Payer: Self-pay | Admitting: Oncology

## 2023-07-11 ENCOUNTER — Inpatient Hospital Stay: Payer: MEDICAID

## 2023-07-11 ENCOUNTER — Inpatient Hospital Stay: Payer: MEDICAID | Attending: Oncology

## 2023-07-11 ENCOUNTER — Inpatient Hospital Stay (HOSPITAL_BASED_OUTPATIENT_CLINIC_OR_DEPARTMENT_OTHER): Payer: MEDICAID | Admitting: Oncology

## 2023-07-11 VITALS — BP 123/85 | HR 92 | Temp 96.8°F | Resp 18 | Ht 72.0 in | Wt 201.0 lb

## 2023-07-11 DIAGNOSIS — Z5111 Encounter for antineoplastic chemotherapy: Secondary | ICD-10-CM

## 2023-07-11 DIAGNOSIS — Z5112 Encounter for antineoplastic immunotherapy: Secondary | ICD-10-CM

## 2023-07-11 DIAGNOSIS — C189 Malignant neoplasm of colon, unspecified: Secondary | ICD-10-CM

## 2023-07-11 DIAGNOSIS — F17290 Nicotine dependence, other tobacco product, uncomplicated: Secondary | ICD-10-CM | POA: Insufficient documentation

## 2023-07-11 DIAGNOSIS — Z8051 Family history of malignant neoplasm of kidney: Secondary | ICD-10-CM | POA: Diagnosis not present

## 2023-07-11 DIAGNOSIS — C7800 Secondary malignant neoplasm of unspecified lung: Secondary | ICD-10-CM | POA: Insufficient documentation

## 2023-07-11 DIAGNOSIS — Z803 Family history of malignant neoplasm of breast: Secondary | ICD-10-CM | POA: Insufficient documentation

## 2023-07-11 LAB — CBC WITH DIFFERENTIAL (CANCER CENTER ONLY)
Abs Immature Granulocytes: 0.02 10*3/uL (ref 0.00–0.07)
Basophils Absolute: 0 10*3/uL (ref 0.0–0.1)
Basophils Relative: 0 %
Eosinophils Absolute: 0.3 10*3/uL (ref 0.0–0.5)
Eosinophils Relative: 5 %
HCT: 42.3 % (ref 39.0–52.0)
Hemoglobin: 13.5 g/dL (ref 13.0–17.0)
Immature Granulocytes: 0 %
Lymphocytes Relative: 16 %
Lymphs Abs: 0.9 10*3/uL (ref 0.7–4.0)
MCH: 30.3 pg (ref 26.0–34.0)
MCHC: 31.9 g/dL (ref 30.0–36.0)
MCV: 95.1 fL (ref 80.0–100.0)
Monocytes Absolute: 0.5 10*3/uL (ref 0.1–1.0)
Monocytes Relative: 9 %
Neutro Abs: 3.9 10*3/uL (ref 1.7–7.7)
Neutrophils Relative %: 70 %
Platelet Count: 260 10*3/uL (ref 150–400)
RBC: 4.45 MIL/uL (ref 4.22–5.81)
RDW: 13.9 % (ref 11.5–15.5)
WBC Count: 5.6 10*3/uL (ref 4.0–10.5)
nRBC: 0 % (ref 0.0–0.2)

## 2023-07-11 LAB — CMP (CANCER CENTER ONLY)
ALT: 17 U/L (ref 0–44)
AST: 20 U/L (ref 15–41)
Albumin: 4 g/dL (ref 3.5–5.0)
Alkaline Phosphatase: 65 U/L (ref 38–126)
Anion gap: 9 (ref 5–15)
BUN: 14 mg/dL (ref 6–20)
CO2: 26 mmol/L (ref 22–32)
Calcium: 9.2 mg/dL (ref 8.9–10.3)
Chloride: 104 mmol/L (ref 98–111)
Creatinine: 1.16 mg/dL (ref 0.61–1.24)
GFR, Estimated: >60 mL/min (ref >60)
Glucose, Bld: 106 mg/dL — ABNORMAL HIGH (ref 70–99)
Potassium: 3.9 mmol/L (ref 3.5–5.1)
Sodium: 139 mmol/L (ref 135–145)
Total Bilirubin: 0.6 mg/dL (ref 0.0–1.2)
Total Protein: 7.5 g/dL (ref 6.5–8.1)

## 2023-07-11 LAB — TOTAL PROTEIN, URINE DIPSTICK: Protein, ur: 30 mg/dL — AB

## 2023-07-11 MED ORDER — ATROPINE SULFATE 1 MG/ML IV SOLN
0.5000 mg | Freq: Once | INTRAVENOUS | Status: AC | PRN
  Administered 2023-07-11: 0.5 mg via INTRAVENOUS
  Filled 2023-07-11: qty 1

## 2023-07-11 MED ORDER — DEXAMETHASONE SODIUM PHOSPHATE 10 MG/ML IJ SOLN
10.0000 mg | Freq: Once | INTRAMUSCULAR | Status: AC
  Administered 2023-07-11: 10 mg via INTRAVENOUS
  Filled 2023-07-11: qty 1

## 2023-07-11 MED ORDER — SODIUM CHLORIDE 0.9 % IV SOLN
400.0000 mg/m2 | Freq: Once | INTRAVENOUS | Status: AC
  Administered 2023-07-11: 856 mg via INTRAVENOUS
  Filled 2023-07-11: qty 42.8

## 2023-07-11 MED ORDER — PALONOSETRON HCL INJECTION 0.25 MG/5ML
0.2500 mg | Freq: Once | INTRAVENOUS | Status: AC
  Administered 2023-07-11: 0.25 mg via INTRAVENOUS
  Filled 2023-07-11: qty 5

## 2023-07-11 MED ORDER — SODIUM CHLORIDE 0.9 % IV SOLN
Freq: Once | INTRAVENOUS | Status: AC
  Filled 2023-07-11: qty 250

## 2023-07-11 MED ORDER — COLD PACK MISC ONCOLOGY
1.0000 | Freq: Once | Status: DC | PRN
Start: 2023-07-11 — End: 2023-07-11
  Filled 2023-07-11: qty 1

## 2023-07-11 MED ORDER — SODIUM CHLORIDE 0.9 % IV SOLN
2400.0000 mg/m2 | INTRAVENOUS | Status: DC
  Administered 2023-07-11: 5000 mg via INTRAVENOUS
  Filled 2023-07-11: qty 100

## 2023-07-11 MED ORDER — SODIUM CHLORIDE 0.9 % IV SOLN
150.0000 mg/m2 | Freq: Once | INTRAVENOUS | Status: AC
  Administered 2023-07-11: 320 mg via INTRAVENOUS
  Filled 2023-07-11: qty 2

## 2023-07-11 MED ORDER — SODIUM CHLORIDE 0.9% FLUSH
10.0000 mL | INTRAVENOUS | Status: DC | PRN
  Filled 2023-07-11: qty 10

## 2023-07-11 MED ORDER — SODIUM CHLORIDE 0.9 % IV SOLN
5.0000 mg/kg | Freq: Once | INTRAVENOUS | Status: AC
  Administered 2023-07-11: 500 mg via INTRAVENOUS
  Filled 2023-07-11: qty 16

## 2023-07-11 MED ORDER — FLUOROURACIL CHEMO INJECTION 2.5 GM/50ML
400.0000 mg/m2 | Freq: Once | INTRAVENOUS | Status: AC
  Administered 2023-07-11: 850 mg via INTRAVENOUS
  Filled 2023-07-11: qty 17

## 2023-07-11 NOTE — Progress Notes (Signed)
 Hematology/Oncology Consult note [facility]  [phone] [phone]  Patient Care Team: [name], MD as PCP - General (Internal Medicine) Creig Hines, MD as Consulting Physician (Hematology and Oncology) Leafy Ro, MD as Consulting Physician (General Surgery) Toney Reil, MD as Consulting Physician (Gastroenterology) Benita Gutter, RN as Oncology Nurse Navigator Carmina Miller, MD as Consulting Physician (Radiation Oncology)   Name of the patient: Connor Wilkinson  [id number]  14-Jan-1986   Date of visit: 07/11/23  Diagnosis-metastatic colon adenocarcinoma with lung metastases and retroperitoneal adenopathy  Chief complaint/ Reason for visit-on treatment assessment prior to cycle 11 of palliative FOLFIRI chemotherapy  Heme/Onc history: patient is a 38 year old male with a past medical history significant for mild bipolar disorder who is a ward of the stateAnd was admitted to the hospital for severe anemia.  He was found to have a hemoglobin of 4.5/18.7 on admission.  He received blood transfusion as well as IV iron and his hemoglobin is presently up to an 8.  He underwent colonoscopy which showed malignant partially obstructing tumor that was circumferential 80 cm proximal to the anus.  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  CT chest abdomen and pelvis with contrast did not show any evidence of distant metastatic disease.  2 small sclerotic densities in the acetabulum and proximal right femur likely benign process.  Pathologically enlarged lymph nodes superior to circumferential colonic lesion suggesting metastatic adenopathy.   Patient underwent right hemicolectomy with Dr. Everlene Farrier on 07/12/2021.  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  Vermiform appendix negative for tumor.  Radial margin positive for tumor (2 lymph nodes at margin with adenocarcinoma).  Proximal and distal margins negative.  7 out of 56 lymph nodes positive for metastatic adenocarcinoma.  Tumor deposits not applicable.  No lymphovascular or perineural invasion seen.  PT4APN2B.  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  BRAF testing was negative.  Genetic testing was offered to the patient but he declined   Fertility conservation was also discussed an option of sperm banking at [facility] was discussed with the patient and his healthcare power of attorney [name].  There would be a one-time cost involved for sperm banking and a yearly cost for preserving the specimen.  Overall chances of infertility with FOLFOX is low but possible.  Patient and his healthcare power of attorney have discussed their options and have decided not to proceed with fertility conservation at this time   Patient completed 12 cycles of adjuvant FOLFOX chemotherapy in followed by adjuvant radiation therapy in September 2023    CT scan and PET scan showed concern for metastatic disease.  18 x 16 mm left lower lobe lung nodule with an SUV of 2.4.  Progressive abdominal/retroperitoneal adenopathy with lymph nodes measuring between 6 mm to 18 mm which were PET avid.  Lung biopsy was negative for malignancy.  Retroperitoneal adenopathy was biopsied and was consistent with mucinous adenocarcinoma of colon primary.  Tissue Tempus testing ATM, BRCA2, PIK3CA mutation.  MLH1 mutation. No evidence of K-ras BRAF or NRAS mutation.  Tumor mutational burden high 95th percentile MSI status could not be assessed because the tumor content is below 30%  Interval history-he is tolerating treatments well so far without any significant nausea vomiting or diarrhea.  He reports  no complaints today.  Nosebleeds have gotten better  ECOG PS-  0 Pain scale- 0   Review of systems- Review of Systems  Constitutional:  Negative for chills, fever, malaise/fatigue and weight loss.  HENT:  Negative for congestion, ear discharge and nosebleeds.   Eyes:  Negative for blurred vision.  Respiratory:  Negative for cough, hemoptysis, sputum production, shortness of breath and wheezing.   Cardiovascular:  Negative for chest pain, palpitations, orthopnea and claudication.  Gastrointestinal:  Negative for abdominal pain, blood in stool, constipation, diarrhea, heartburn, melena, nausea and vomiting.  Genitourinary:  Negative for dysuria, flank pain, frequency, hematuria and urgency.  Musculoskeletal:  Negative for back pain, joint pain and myalgias.  Skin:  Negative for rash.  Neurological:  Negative for dizziness, tingling, focal weakness, seizures, weakness and headaches.  Endo/Heme/Allergies:  Does not bruise/bleed easily.  Psychiatric/Behavioral:  Negative for depression and suicidal ideas. The patient does not have insomnia.       Allergies  Allergen Reactions   Geodon [Ziprasidone Hcl] Anaphylaxis     Past Medical History:  Diagnosis Date   Bipolar 1 disorder (HCC)    Colon cancer (HCC)    Lung nodule 09/2022   PTSD (post-traumatic stress disorder)      Past Surgical History:  Procedure Laterality Date   BRONCHIAL NEEDLE ASPIRATION BIOPSY  10/12/2022   Procedure: BRONCHIAL NEEDLE ASPIRATION BIOPSIES;  Surgeon: Raechel Chute, MD;  Location: [facility] ENDOSCOPY;  Service: Pulmonary;;   COLON RESECTION N/A 07/12/2021   Procedure: HAND ASSISTED LAPAROSCOPIC COLON RESECTION;  Surgeon: Leafy Ro, MD;  Location: [facility] ORS;  Service: General;  Laterality: N/A;   COLONOSCOPY WITH PROPOFOL N/A 07/06/2021   Procedure: COLONOSCOPY WITH PROPOFOL;  Surgeon: Toney Reil, MD;  Location: [facility] ENDOSCOPY;  Service: Gastroenterology;  Laterality: N/A;   COLONOSCOPY WITH PROPOFOL N/A 07/07/2021   Procedure: COLONOSCOPY WITH PROPOFOL;   Surgeon: Toney Reil, MD;  Location: [facility] ENDOSCOPY;  Service: Gastroenterology;  Laterality: N/A;   COLONOSCOPY WITH PROPOFOL N/A 08/02/2022   Procedure: COLONOSCOPY WITH PROPOFOL;  Surgeon: Toney Reil, MD;  Location: [facility] ENDOSCOPY;  Service: Gastroenterology;  Laterality: N/AEarvin Hansen (Caregiver & Transporter) [phone] [name] [phone] will be available for phone consent   ESOPHAGOGASTRODUODENOSCOPY (EGD) WITH PROPOFOL N/A 07/06/2021   Procedure: ESOPHAGOGASTRODUODENOSCOPY (EGD) WITH PROPOFOL;  Surgeon: Toney Reil, MD;  Location: [facility] ENDOSCOPY;  Service: Gastroenterology;  Laterality: N/A;   GIVENS CAPSULE STUDY N/A 07/06/2021   Procedure: GIVENS CAPSULE STUDY;  Surgeon: Toney Reil, MD;  Location: [facility] ENDOSCOPY;  Service: Gastroenterology;  Laterality: N/A;   IR IMAGING GUIDED PORT INSERTION  01/19/2023   IR REMOVAL TUN ACCESS W/ PORT W/O FL MOD SED  02/15/2022   PORTACATH PLACEMENT Right 07/22/2021   Procedure: INSERTION PORT-A-CATH;  Surgeon: Leafy Ro, MD;  Location: [facility] ORS;  Service: General;  Laterality: Right;    Social History   Socioeconomic History   Marital status: Single    Spouse name: Not on file   Number of children: Not on file   Years of education: Not on file   Highest education level: Not on file  Occupational History   Not on file  Tobacco Use   Smoking status: Every Day    Current packs/day: 0.25    Types: Cigarettes   Smokeless tobacco: Current   Tobacco comments:    4 cigarettes a day - khj 09/28/2022  Vaping Use   Vaping status: Every Day   Devices: elfbar  Substance and Sexual Activity   Alcohol use: Not Currently   Drug use: Not Currently   Sexual activity: Not Currently  Other Topics Concern   Not on file  Social History Narrative   Not on file   Social Drivers of Health   Financial Resource Strain: Not on file  Food Insecurity: Not on file  Transportation Needs: Not on file  Physical  Activity: Not on file  Stress: Not on file  Social Connections: Not on file  Intimate Partner Violence: Not on file    Family History  Problem Relation Age of Onset   Kidney cancer Father    Breast cancer Maternal Aunt      Current Outpatient Medications:    acetaminophen (TYLENOL) 325 MG tablet, Take 2 tablets (650 mg total) by mouth every 6 (six) hours as needed for mild pain or moderate pain., Disp: 30 tablet, Rfl: 0   ARIPiprazole (ABILIFY) 10 MG tablet, Take 10 mg by mouth in the morning. (Patient not taking: Reported on 05/30/2023), Disp: , Rfl:    ARIPiprazole ER (ABILIFY MAINTENA) 400 MG PRSY prefilled syringe, Inject 400 mg into the muscle every 30 (thirty) days., Disp: , Rfl:    benztropine (COGENTIN) 2 MG tablet, Take 2 mg by mouth at bedtime., Disp: , Rfl:    busPIRone (BUSPAR) 10 MG tablet, Take 10 mg by mouth 2 (two) times daily., Disp: , Rfl:    dexamethasone (DECADRON) 4 MG tablet, Take 2 tablets (8 mg total) by mouth daily. Start the day after chemotherapy for 2 days. Take with food., Disp: 8 tablet, Rfl: 5   FLUoxetine (PROZAC) 20 MG capsule, Take 20 mg by mouth daily., Disp: , Rfl:    ibuprofen (ADVIL) 600 MG tablet, Take 600 mg by mouth every 6 (six) hours as needed for moderate pain., Disp: , Rfl:    ibuprofen (ADVIL) 800 MG tablet, Take 1 tablet (800 mg total) by mouth every 8 (eight) hours as needed., Disp: 30 tablet, Rfl: 0   lidocaine-prilocaine (EMLA) cream, Apply 1 Application topically See admin instructions. Apply to affected area once as directed 1 hour before the pt. Comes to get chemo, Disp: 30 g, Rfl: 2   loperamide (IMODIUM) 2 MG capsule, Take 2 tabs by mouth with first loose stool, then 1 tab with each additional loose stool as needed. Do not exceed 8 tabs in a 24-hour period, Disp: 60 capsule, Rfl: 0   Melatonin 10 MG CAPS, Take 10 mg by mouth at bedtime., Disp: , Rfl:    ondansetron (ZOFRAN) 8 MG tablet, Take 8 mg by mouth 2 (two) times daily as needed  for nausea or vomiting., Disp: , Rfl:    ondansetron (ZOFRAN) 8 MG tablet, Take 1 tablet (8 mg total) by mouth every 8 (eight) hours as needed for nausea, vomiting or refractory nausea / vomiting. Start on the third day after chemotherapy., Disp: 30 tablet, Rfl: 1   oxyCODONE (OXY IR/ROXICODONE) 5 MG immediate release tablet, Take 5 mg by mouth every 6 (six) hours as needed for severe pain or breakthrough pain. (Patient not taking: Reported on 03/21/2023), Disp: , Rfl:    pantoprazole (PROTONIX) 20 MG tablet, TAKE 1 TABLET BY MOUTH ONCE DAILY *DO NOT CRUSH OR CHEW*, Disp: 24 tablet, Rfl: 10   polyethylene glycol (MIRALAX / GLYCOLAX) 17 g packet, Take 17 g by mouth daily as needed (constipation)., Disp: 14 each, Rfl: 0   prazosin (MINIPRESS) 2 MG capsule, Take 2 mg by mouth 2 (two)  times daily., Disp: , Rfl:    prochlorperazine (COMPAZINE) 10 MG tablet, Take 10 mg by mouth every 6 (six) hours as needed for nausea or vomiting., Disp: , Rfl:  No current facility-administered medications for this visit.  Facility-Administered Medications Ordered in Other Visits:    Cold Pack 1 packet, 1 packet, Topical, Once PRN, Creig Hines, MD   fluorouracil (ADRUCIL) 5,000 mg in sodium chloride 0.9 % 150 mL chemo infusion, 2,400 mg/m2 (Treatment Plan Recorded), Intravenous, 1 day or 1 dose, Creig Hines, MD   fluorouracil (ADRUCIL) chemo injection 850 mg, 400 mg/m2 (Treatment Plan Recorded), Intravenous, Once, Creig Hines, MD   irinotecan (CAMPTOSAR) 320 mg in sodium chloride 0.9 % 500 mL chemo infusion, 150 mg/m2 (Treatment Plan Recorded), Intravenous, Once, Creig Hines, MD, Last Rate: 344 mL/hr at 07/11/23 1225, 320 mg at 07/11/23 1225   leucovorin 856 mg in sodium chloride 0.9 % 250 mL infusion, 400 mg/m2 (Treatment Plan Recorded), Intravenous, Once, Creig Hines, MD, Last Rate: 195 mL/hr at 07/11/23 1224, 856 mg at 07/11/23 1224   sodium chloride flush (NS) 0.9 % injection 10 mL, 10 mL, Intracatheter,  PRN, Creig Hines, MD  Physical exam:  Vitals:   07/11/23 1114  BP: 123/85  Pulse: 92  Resp: 18  Temp: (!) 96.8 F (36 C)  TempSrc: Tympanic  SpO2: 100%  Weight: 201 lb (91.2 kg)  Height: 6' (1.829 m)   Physical Exam Cardiovascular:     Rate and Rhythm: Normal rate and regular rhythm.     Heart sounds: Normal heart sounds.  Pulmonary:     Effort: Pulmonary effort is normal.     Breath sounds: Normal breath sounds.  Abdominal:     General: Bowel sounds are normal.     Palpations: Abdomen is soft.  Skin:    General: Skin is warm and dry.  Neurological:     Mental Status: He is alert and oriented to person, place, and time.         Latest Ref Rng & Units 07/11/2023   10:50 AM  CMP  Glucose 70 - 99 mg/dL 244   BUN 6 - 20 mg/dL 14   Creatinine 0.10 - 1.24 mg/dL 2.72   Sodium 536 - 644 mmol/L 139   Potassium 3.5 - 5.1 mmol/L 3.9   Chloride 98 - 111 mmol/L 104   CO2 22 - 32 mmol/L 26   Calcium 8.9 - 10.3 mg/dL 9.2   Total Protein 6.5 - 8.1 g/dL 7.5   Total Bilirubin 0.0 - 1.2 mg/dL 0.6   Alkaline Phos 38 - 126 U/L 65   AST 15 - 41 U/L 20   ALT 0 - 44 U/L 17       Latest Ref Rng & Units 07/11/2023   10:50 AM  CBC  WBC 4.0 - 10.5 K/uL 5.6   Hemoglobin 13.0 - 17.0 g/dL 03.4   Hematocrit 74.2 - 52.0 % 42.3   Platelets 150 - 400 K/uL 260      Assessment and plan- Patient is a 37 y.o. male with metastatic colon cancer here for on treatment assistive prior to cycle 11 of palliative FOLFIRI  Avastin chemotherapy  Counts okay to proceed with cycle 11 of palliative FOLFIRI Avastin chemotherapy today.  CEA from today is pending.  I will see him back in 2 weeks for cycle 12.  Plan to get repeat CT chest abdomen and pelvis with contrast in 3 weeks.  If there is  evidence of progressive disease I will switch him to single agent Keytruda given that he is MSI high.  If there is evidence of stable disease/slow progression I will consider giving him treatment break at this time  and switch to [facility] upon progression.  Patient comprehends my plan well   Visit Diagnosis 1. Adenocarcinoma of colon (HCC)   2. Encounter for antineoplastic chemotherapy   3. Encounter for monoclonal antibody treatment for malignancy      Dr. Owens Shark, MD, MPH [facility] at [facility] [phone] 07/11/2023 1:03 PM

## 2023-07-12 ENCOUNTER — Other Ambulatory Visit: Payer: Self-pay

## 2023-07-12 LAB — CEA: CEA: 3.7 ng/mL (ref 0.0–4.7)

## 2023-07-13 ENCOUNTER — Inpatient Hospital Stay: Payer: MEDICAID

## 2023-07-13 VITALS — BP 122/86 | HR 96 | Resp 16

## 2023-07-13 DIAGNOSIS — C189 Malignant neoplasm of colon, unspecified: Secondary | ICD-10-CM

## 2023-07-13 DIAGNOSIS — Z5111 Encounter for antineoplastic chemotherapy: Secondary | ICD-10-CM | POA: Diagnosis not present

## 2023-07-13 MED ORDER — HEPARIN SOD (PORK) LOCK FLUSH 100 UNIT/ML IV SOLN
500.0000 [IU] | Freq: Once | INTRAVENOUS | Status: AC | PRN
  Administered 2023-07-13: 500 [IU]
  Filled 2023-07-13: qty 5

## 2023-07-18 ENCOUNTER — Ambulatory Visit: Payer: MEDICAID | Admitting: Oncology

## 2023-07-18 ENCOUNTER — Ambulatory Visit: Payer: MEDICAID

## 2023-07-18 ENCOUNTER — Other Ambulatory Visit: Payer: MEDICAID

## 2023-07-25 ENCOUNTER — Encounter: Payer: Self-pay | Admitting: Oncology

## 2023-07-25 ENCOUNTER — Inpatient Hospital Stay: Payer: MEDICAID | Attending: Oncology

## 2023-07-25 ENCOUNTER — Inpatient Hospital Stay (HOSPITAL_BASED_OUTPATIENT_CLINIC_OR_DEPARTMENT_OTHER): Payer: MEDICAID | Admitting: Oncology

## 2023-07-25 ENCOUNTER — Encounter: Payer: Self-pay | Admitting: Radiation Oncology

## 2023-07-25 ENCOUNTER — Inpatient Hospital Stay: Payer: MEDICAID

## 2023-07-25 ENCOUNTER — Ambulatory Visit
Admission: RE | Admit: 2023-07-25 | Discharge: 2023-07-25 | Disposition: A | Discharge status: Home / Self Care | Payer: MEDICAID | Source: Ambulatory Visit | Attending: Radiation Oncology | Admitting: Radiation Oncology

## 2023-07-25 VITALS — BP 127/94 | HR 89 | Temp 96.9°F | Resp 18 | Ht 72.0 in | Wt 196.9 lb

## 2023-07-25 VITALS — BP 120/74 | HR 87 | Temp 96.5°F | Resp 19 | Ht 72.0 in | Wt 196.0 lb

## 2023-07-25 DIAGNOSIS — R918 Other nonspecific abnormal finding of lung field: Secondary | ICD-10-CM | POA: Insufficient documentation

## 2023-07-25 DIAGNOSIS — C779 Secondary and unspecified malignant neoplasm of lymph node, unspecified: Secondary | ICD-10-CM | POA: Insufficient documentation

## 2023-07-25 DIAGNOSIS — Z8051 Family history of malignant neoplasm of kidney: Secondary | ICD-10-CM | POA: Insufficient documentation

## 2023-07-25 DIAGNOSIS — C189 Malignant neoplasm of colon, unspecified: Secondary | ICD-10-CM

## 2023-07-25 DIAGNOSIS — Z5111 Encounter for antineoplastic chemotherapy: Secondary | ICD-10-CM

## 2023-07-25 DIAGNOSIS — R590 Localized enlarged lymph nodes: Secondary | ICD-10-CM | POA: Diagnosis not present

## 2023-07-25 DIAGNOSIS — Z923 Personal history of irradiation: Secondary | ICD-10-CM | POA: Insufficient documentation

## 2023-07-25 DIAGNOSIS — F17210 Nicotine dependence, cigarettes, uncomplicated: Secondary | ICD-10-CM | POA: Insufficient documentation

## 2023-07-25 DIAGNOSIS — C7800 Secondary malignant neoplasm of unspecified lung: Secondary | ICD-10-CM | POA: Diagnosis not present

## 2023-07-25 DIAGNOSIS — C184 Malignant neoplasm of transverse colon: Secondary | ICD-10-CM | POA: Insufficient documentation

## 2023-07-25 DIAGNOSIS — Z5112 Encounter for antineoplastic immunotherapy: Secondary | ICD-10-CM

## 2023-07-25 DIAGNOSIS — Z803 Family history of malignant neoplasm of breast: Secondary | ICD-10-CM | POA: Insufficient documentation

## 2023-07-25 DIAGNOSIS — R911 Solitary pulmonary nodule: Secondary | ICD-10-CM

## 2023-07-25 LAB — CMP (CANCER CENTER ONLY)
ALT: 11 U/L (ref 0–44)
AST: 19 U/L (ref 15–41)
Albumin: 3.2 g/dL — ABNORMAL LOW (ref 3.5–5.0)
Alkaline Phosphatase: 51 U/L (ref 38–126)
Anion gap: 7 (ref 5–15)
BUN: 11 mg/dL (ref 6–20)
CO2: 21 mmol/L — ABNORMAL LOW (ref 22–32)
Calcium: 7.3 mg/dL — ABNORMAL LOW (ref 8.9–10.3)
Chloride: 111 mmol/L (ref 98–111)
Creatinine: 0.82 mg/dL (ref 0.61–1.24)
GFR, Estimated: >60 mL/min (ref >60)
Glucose, Bld: 78 mg/dL (ref 70–99)
Potassium: 3.4 mmol/L — ABNORMAL LOW (ref 3.5–5.1)
Sodium: 139 mmol/L (ref 135–145)
Total Bilirubin: 0.7 mg/dL (ref 0.0–1.2)
Total Protein: 5.6 g/dL — ABNORMAL LOW (ref 6.5–8.1)

## 2023-07-25 LAB — CBC WITH DIFFERENTIAL (CANCER CENTER ONLY)
Abs Immature Granulocytes: 0.01 10*3/uL (ref 0.00–0.07)
Basophils Absolute: 0 10*3/uL (ref 0.0–0.1)
Basophils Relative: 1 %
Eosinophils Absolute: 0.2 10*3/uL (ref 0.0–0.5)
Eosinophils Relative: 4 %
HCT: 40 % (ref 39.0–52.0)
Hemoglobin: 13 g/dL (ref 13.0–17.0)
Immature Granulocytes: 0 %
Lymphocytes Relative: 15 %
Lymphs Abs: 0.7 10*3/uL (ref 0.7–4.0)
MCH: 30.4 pg (ref 26.0–34.0)
MCHC: 32.5 g/dL (ref 30.0–36.0)
MCV: 93.5 fL (ref 80.0–100.0)
Monocytes Absolute: 0.5 10*3/uL (ref 0.1–1.0)
Monocytes Relative: 11 %
Neutro Abs: 3.1 10*3/uL (ref 1.7–7.7)
Neutrophils Relative %: 69 %
Platelet Count: 203 10*3/uL (ref 150–400)
RBC: 4.28 MIL/uL (ref 4.22–5.81)
RDW: 14 % (ref 11.5–15.5)
WBC Count: 4.5 10*3/uL (ref 4.0–10.5)
nRBC: 0 % (ref 0.0–0.2)

## 2023-07-25 LAB — TOTAL PROTEIN, URINE DIPSTICK: Protein, ur: NEGATIVE mg/dL

## 2023-07-25 MED ORDER — SODIUM CHLORIDE 0.9 % IV SOLN
5.0000 mg/kg | Freq: Once | INTRAVENOUS | Status: AC
  Administered 2023-07-25: 500 mg via INTRAVENOUS
  Filled 2023-07-25: qty 16

## 2023-07-25 MED ORDER — ATROPINE SULFATE 1 MG/ML IV SOLN
0.5000 mg | Freq: Once | INTRAVENOUS | Status: AC | PRN
  Administered 2023-07-25: 0.5 mg via INTRAVENOUS

## 2023-07-25 MED ORDER — DEXAMETHASONE SODIUM PHOSPHATE 10 MG/ML IJ SOLN
10.0000 mg | Freq: Once | INTRAMUSCULAR | Status: AC
  Administered 2023-07-25: 10 mg via INTRAVENOUS
  Filled 2023-07-25: qty 1

## 2023-07-25 MED ORDER — SODIUM CHLORIDE 0.9 % IV SOLN
2400.0000 mg/m2 | INTRAVENOUS | Status: AC
  Administered 2023-07-25: 5000 mg via INTRAVENOUS
  Filled 2023-07-25: qty 100

## 2023-07-25 MED ORDER — FLUOROURACIL CHEMO INJECTION 2.5 GM/50ML
400.0000 mg/m2 | Freq: Once | INTRAVENOUS | Status: AC
  Administered 2023-07-25: 850 mg via INTRAVENOUS
  Filled 2023-07-25: qty 17

## 2023-07-25 MED ORDER — SODIUM CHLORIDE 0.9 % IV SOLN
150.0000 mg/m2 | Freq: Once | INTRAVENOUS | Status: AC
  Administered 2023-07-25: 320 mg via INTRAVENOUS
  Filled 2023-07-25: qty 15

## 2023-07-25 MED ORDER — PALONOSETRON HCL INJECTION 0.25 MG/5ML
0.2500 mg | Freq: Once | INTRAVENOUS | Status: AC
  Administered 2023-07-25: 0.25 mg via INTRAVENOUS
  Filled 2023-07-25: qty 5

## 2023-07-25 MED ORDER — SODIUM CHLORIDE 0.9 % IV SOLN
400.0000 mg/m2 | Freq: Once | INTRAVENOUS | Status: AC
  Administered 2023-07-25: 856 mg via INTRAVENOUS
  Filled 2023-07-25: qty 25

## 2023-07-25 MED ORDER — SODIUM CHLORIDE 0.9 % IV SOLN
Freq: Once | INTRAVENOUS | Status: AC
  Filled 2023-07-25: qty 250

## 2023-07-25 NOTE — Progress Notes (Signed)
 Hematology/Oncology Consult note [facility]  [phone] [phone]  Patient Care Team: [name], MD as PCP - General (Internal Medicine) Creig Hines, MD as Consulting Physician (Hematology and Oncology) Leafy Ro, MD as Consulting Physician (General Surgery) Toney Reil, MD as Consulting Physician (Gastroenterology) Benita Gutter, RN as Oncology Nurse Navigator Carmina Miller, MD as Consulting Physician (Radiation Oncology)   Name of the patient: Connor Wilkinson  [id number]  07-Feb-1986   Date of visit: 07/25/23  Diagnosis- metastatic colon adenocarcinoma with lung metastases and retroperitoneal adenopathy   Chief complaint/ Reason for visit-on treatment assessment prior to cycle 12 of palliative FOLFIRI Avastin chemotherapy  Heme/Onc history: patient is a 38 year old male with a past medical history significant for mild bipolar disorder who is a ward of the stateAnd was admitted to the hospital for severe anemia.  He was found to have a hemoglobin of 4.5/18.7 on admission.  He received blood transfusion as well as IV iron and his hemoglobin is presently up to an 8.  He underwent colonoscopy which showed malignant partially obstructing tumor that was circumferential 80 cm proximal to the anus.  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  CT chest abdomen and pelvis with contrast did not show any evidence of distant metastatic disease.  2 small sclerotic densities in the acetabulum and proximal right femur likely benign process.  Pathologically enlarged lymph nodes superior to circumferential colonic lesion suggesting metastatic adenopathy.   Patient underwent right hemicolectomy with Dr. Everlene Farrier on 07/12/2021.  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  Vermiform appendix negative for tumor.  Radial margin positive for tumor (2 lymph nodes at margin with adenocarcinoma).  Proximal and distal margins negative.  7 out of 56 lymph nodes positive for metastatic adenocarcinoma.  Tumor deposits not applicable.  No lymphovascular or perineural invasion seen.  PT4APN2B.  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  BRAF testing was negative.  Genetic testing was offered to the patient but he declined   Fertility conservation was also discussed an option of sperm banking at [facility] was discussed with the patient and his healthcare power of attorney [name].  There would be a one-time cost involved for sperm banking and a yearly cost for preserving the specimen.  Overall chances of infertility with FOLFOX is low but possible.  Patient and his healthcare power of attorney have discussed their options and have decided not to proceed with fertility conservation at this time   Patient completed 12 cycles of adjuvant FOLFOX chemotherapy in followed by adjuvant radiation therapy in September 2023    CT scan and PET scan showed concern for metastatic disease.  18 x 16 mm left lower lobe lung nodule with an SUV of 2.4.  Progressive abdominal/retroperitoneal adenopathy with lymph nodes measuring between 6 mm to 18 mm which were PET avid.  Lung biopsy was negative for malignancy.  Retroperitoneal adenopathy was biopsied and was consistent with mucinous adenocarcinoma of colon primary.  Tissue Tempus testing ATM, BRCA2, PIK3CA mutation.  MLH1 mutation. No evidence of K-ras BRAF or NRAS mutation.  Tumor mutational burden high 95th percentile MSI status could not be assessed because the tumor content is below 30%  Patient is presently on palliative FOLFIRI Avastin chemotherapy.  He also received radiation treatment/SBRT to  the dominant left lower lobe lung nodules  Interval history-tolerating  treatment well so far without any significant side effects.  ECOG PS- 0 Pain scale- 0   Review of systems- Review of Systems  Constitutional:  Negative for chills, fever, malaise/fatigue and weight loss.  HENT:  Negative for congestion, ear discharge and nosebleeds.   Eyes:  Negative for blurred vision.  Respiratory:  Negative for cough, hemoptysis, sputum production, shortness of breath and wheezing.   Cardiovascular:  Negative for chest pain, palpitations, orthopnea and claudication.  Gastrointestinal:  Negative for abdominal pain, blood in stool, constipation, diarrhea, heartburn, melena, nausea and vomiting.  Genitourinary:  Negative for dysuria, flank pain, frequency, hematuria and urgency.  Musculoskeletal:  Negative for back pain, joint pain and myalgias.  Skin:  Negative for rash.  Neurological:  Negative for dizziness, tingling, focal weakness, seizures, weakness and headaches.  Endo/Heme/Allergies:  Does not bruise/bleed easily.  Psychiatric/Behavioral:  Negative for depression and suicidal ideas. The patient does not have insomnia.       Allergies  Allergen Reactions   Geodon [Ziprasidone Hcl] Anaphylaxis     Past Medical History:  Diagnosis Date   Bipolar 1 disorder (HCC)    Colon cancer (HCC)    Lung nodule 09/2022   PTSD (post-traumatic stress disorder)      Past Surgical History:  Procedure Laterality Date   BRONCHIAL NEEDLE ASPIRATION BIOPSY  10/12/2022   Procedure: BRONCHIAL NEEDLE ASPIRATION BIOPSIES;  Surgeon: Raechel Chute, MD;  Location: [facility] ENDOSCOPY;  Service: Pulmonary;;   COLON RESECTION N/A 07/12/2021   Procedure: HAND ASSISTED LAPAROSCOPIC COLON RESECTION;  Surgeon: Leafy Ro, MD;  Location: [facility] ORS;  Service: General;  Laterality: N/A;   COLONOSCOPY WITH PROPOFOL N/A 07/06/2021   Procedure: COLONOSCOPY WITH PROPOFOL;  Surgeon: Toney Reil, MD;  Location: [facility] ENDOSCOPY;  Service: Gastroenterology;  Laterality: N/A;   COLONOSCOPY WITH  PROPOFOL N/A 07/07/2021   Procedure: COLONOSCOPY WITH PROPOFOL;  Surgeon: Toney Reil, MD;  Location: [facility] ENDOSCOPY;  Service: Gastroenterology;  Laterality: N/A;   COLONOSCOPY WITH PROPOFOL N/A 08/02/2022   Procedure: COLONOSCOPY WITH PROPOFOL;  Surgeon: Toney Reil, MD;  Location: [facility] ENDOSCOPY;  Service: Gastroenterology;  Laterality: N/AEarvin Hansen (Caregiver & Transporter) [phone] [name] [phone] will be available for phone consent   ESOPHAGOGASTRODUODENOSCOPY (EGD) WITH PROPOFOL N/A 07/06/2021   Procedure: ESOPHAGOGASTRODUODENOSCOPY (EGD) WITH PROPOFOL;  Surgeon: Toney Reil, MD;  Location: [facility] ENDOSCOPY;  Service: Gastroenterology;  Laterality: N/A;   GIVENS CAPSULE STUDY N/A 07/06/2021   Procedure: GIVENS CAPSULE STUDY;  Surgeon: Toney Reil, MD;  Location: [facility] ENDOSCOPY;  Service: Gastroenterology;  Laterality: N/A;   IR IMAGING GUIDED PORT INSERTION  01/19/2023   IR REMOVAL TUN ACCESS W/ PORT W/O FL MOD SED  02/15/2022   PORTACATH PLACEMENT Right 07/22/2021   Procedure: INSERTION PORT-A-CATH;  Surgeon: Leafy Ro, MD;  Location: [facility] ORS;  Service: General;  Laterality: Right;    Social History   Socioeconomic History   Marital status: Single    Spouse name: Not on file   Number of children: Not on file   Years of education: Not on file   Highest education level: Not on file  Occupational History   Not on file  Tobacco Use   Smoking status: Every Day    Current packs/day: 0.25    Types: Cigarettes   Smokeless tobacco: Current   Tobacco comments:    4 cigarettes a day - khj 09/28/2022  Vaping Use  Vaping status: Every Day   Devices: elfbar  Substance and Sexual Activity   Alcohol use: Not Currently   Drug use: Not Currently   Sexual activity: Not Currently  Other Topics Concern   Not on file  Social History Narrative   Not on file   Social Drivers of Health   Financial Resource Strain: Not on file  Food  Insecurity: Not on file  Transportation Needs: Not on file  Physical Activity: Not on file  Stress: Not on file  Social Connections: Not on file  Intimate Partner Violence: Not on file    Family History  Problem Relation Age of Onset   Kidney cancer Father    Breast cancer Maternal Aunt      Current Outpatient Medications:    acetaminophen (TYLENOL) 325 MG tablet, Take 2 tablets (650 mg total) by mouth every 6 (six) hours as needed for mild pain or moderate pain., Disp: 30 tablet, Rfl: 0   ARIPiprazole (ABILIFY) 10 MG tablet, Take 10 mg by mouth in the morning. (Patient not taking: Reported on 05/30/2023), Disp: , Rfl:    ARIPiprazole ER (ABILIFY MAINTENA) 400 MG PRSY prefilled syringe, Inject 400 mg into the muscle every 30 (thirty) days., Disp: , Rfl:    benztropine (COGENTIN) 2 MG tablet, Take 2 mg by mouth at bedtime., Disp: , Rfl:    busPIRone (BUSPAR) 10 MG tablet, Take 10 mg by mouth 2 (two) times daily., Disp: , Rfl:    dexamethasone (DECADRON) 4 MG tablet, Take 2 tablets (8 mg total) by mouth daily. Start the day after chemotherapy for 2 days. Take with food., Disp: 8 tablet, Rfl: 5   FLUoxetine (PROZAC) 20 MG capsule, Take 20 mg by mouth daily., Disp: , Rfl:    ibuprofen (ADVIL) 600 MG tablet, Take 600 mg by mouth every 6 (six) hours as needed for moderate pain., Disp: , Rfl:    ibuprofen (ADVIL) 800 MG tablet, Take 1 tablet (800 mg total) by mouth every 8 (eight) hours as needed., Disp: 30 tablet, Rfl: 0   lidocaine-prilocaine (EMLA) cream, Apply 1 Application topically See admin instructions. Apply to affected area once as directed 1 hour before the pt. Comes to get chemo, Disp: 30 g, Rfl: 2   loperamide (IMODIUM) 2 MG capsule, Take 2 tabs by mouth with first loose stool, then 1 tab with each additional loose stool as needed. Do not exceed 8 tabs in a 24-hour period, Disp: 60 capsule, Rfl: 0   Melatonin 10 MG CAPS, Take 10 mg by mouth at bedtime., Disp: , Rfl:    ondansetron  (ZOFRAN) 8 MG tablet, Take 8 mg by mouth 2 (two) times daily as needed for nausea or vomiting., Disp: , Rfl:    ondansetron (ZOFRAN) 8 MG tablet, Take 1 tablet (8 mg total) by mouth every 8 (eight) hours as needed for nausea, vomiting or refractory nausea / vomiting. Start on the third day after chemotherapy., Disp: 30 tablet, Rfl: 1   oxyCODONE (OXY IR/ROXICODONE) 5 MG immediate release tablet, Take 5 mg by mouth every 6 (six) hours as needed for severe pain or breakthrough pain. (Patient not taking: Reported on 03/21/2023), Disp: , Rfl:    pantoprazole (PROTONIX) 20 MG tablet, TAKE 1 TABLET BY MOUTH ONCE DAILY *DO NOT CRUSH OR CHEW*, Disp: 24 tablet, Rfl: 10   polyethylene glycol (MIRALAX / GLYCOLAX) 17 g packet, Take 17 g by mouth daily as needed (constipation)., Disp: 14 each, Rfl: 0   prazosin (MINIPRESS) 2  MG capsule, Take 2 mg by mouth 2 (two) times daily., Disp: , Rfl:    prochlorperazine (COMPAZINE) 10 MG tablet, Take 10 mg by mouth every 6 (six) hours as needed for nausea or vomiting., Disp: , Rfl:  No current facility-administered medications for this visit.  Facility-Administered Medications Ordered in Other Visits:    fluorouracil (ADRUCIL) 5,000 mg in sodium chloride 0.9 % 150 mL chemo infusion, 2,400 mg/m2 (Treatment Plan Recorded), Intravenous, 1 day or 1 dose, Creig Hines, MD   fluorouracil (ADRUCIL) chemo injection 850 mg, 400 mg/m2 (Treatment Plan Recorded), Intravenous, Once, Creig Hines, MD   irinotecan (CAMPTOSAR) 320 mg in sodium chloride 0.9 % 500 mL chemo infusion, 150 mg/m2 (Treatment Plan Recorded), Intravenous, Once, Creig Hines, MD, Last Rate: 344 mL/hr at 07/25/23 1120, 320 mg at 07/25/23 1120   leucovorin 856 mg in sodium chloride 0.9 % 250 mL infusion, 400 mg/m2 (Treatment Plan Recorded), Intravenous, Once, Creig Hines, MD, Last Rate: 195 mL/hr at 07/25/23 1124, 856 mg at 07/25/23 1124  Physical exam:  Vitals:   07/25/23 0953  BP: 120/74  Pulse: 87   Resp: 19  Temp: (!) 96.5 F (35.8 C)  TempSrc: Tympanic  SpO2: 100%  Weight: 196 lb (88.9 kg)  Height: 6' (1.829 m)   Physical Exam Cardiovascular:     Rate and Rhythm: Normal rate and regular rhythm.     Heart sounds: Normal heart sounds.  Pulmonary:     Effort: Pulmonary effort is normal.     Breath sounds: Normal breath sounds.  Abdominal:     General: Bowel sounds are normal.     Palpations: Abdomen is soft.  Skin:    General: Skin is warm and dry.  Neurological:     Mental Status: He is alert and oriented to person, place, and time.      I have personally reviewed labs listed below:    Latest Ref Rng & Units 07/25/2023    9:25 AM  CMP  Glucose 70 - 99 mg/dL 78   BUN 6 - 20 mg/dL 11   Creatinine 1.61 - 1.24 mg/dL 0.96   Sodium 045 - 409 mmol/L 139   Potassium 3.5 - 5.1 mmol/L 3.4   Chloride 98 - 111 mmol/L 111   CO2 22 - 32 mmol/L 21   Calcium 8.9 - 10.3 mg/dL 7.3   Total Protein 6.5 - 8.1 g/dL 5.6   Total Bilirubin 0.0 - 1.2 mg/dL 0.7   Alkaline Phos 38 - 126 U/L 51   AST 15 - 41 U/L 19   ALT 0 - 44 U/L 11       Latest Ref Rng & Units 07/25/2023    9:25 AM  CBC  WBC 4.0 - 10.5 K/uL 4.5   Hemoglobin 13.0 - 17.0 g/dL 81.1   Hematocrit 91.4 - 52.0 % 40.0   Platelets 150 - 400 K/uL 203      Assessment and plan- Patient is a 38 y.o. male with metastatic colon cancer here for on treatment assessment prior to cycle 12 of palliative FOLFIRI Avastin chemotherapy  Okay to proceed with cycle 12Palliative FOLFIRI Avastin chemotherapy today.  He will be getting CT scans next week and I will see him back in 3 weeks to discuss the results of scans.  CEA overall has been trending down.  My plan is to offer him treatment break if scan shows stable disease or partial response.  Given that he has MSI high disease I  will plan to switch him to [facility] at progression.  Blood pressure remains stable and urine protein is trace to proceed with a Avastin today   Visit  Diagnosis 1. Encounter for antineoplastic chemotherapy   2. Encounter for monoclonal antibody treatment for malignancy   3. Adenocarcinoma of colon [facility])      Dr. Owens Shark, MD, MPH [facility] at [facility] [phone] 07/25/2023 12:45 PM

## 2023-07-25 NOTE — Patient Instructions (Signed)
 [facility] - A DEPT OF [facility]  Discharge Instructions: Thank you for choosing [facility] to provide your oncology and hematology care.  If you have a lab appointment with the Cancer Center, please go directly to the Cancer Center and check in at the registration area.  Wear comfortable clothing and clothing appropriate for easy access to any Portacath or PICC line.   We strive to give you quality time with your provider. You may need to reschedule your appointment if you arrive late (15 or more minutes).  Arriving late affects you and other patients whose appointments are after yours.  Also, if you miss three or more appointments without notifying the office, you may be dismissed from the clinic at the provider's discretion.      For prescription refill requests, have your pharmacy contact our office and allow 72 hours for refills to be completed.    Today you received the following chemotherapy and/or immunotherapy agents VEGZELMA, IRINOTECAN, LEUCOVORIN , 5 FU      To help prevent nausea and vomiting after your treatment, we encourage you to take your nausea medication as directed.  BELOW ARE SYMPTOMS THAT SHOULD BE REPORTED IMMEDIATELY: *FEVER GREATER THAN 100.4 F (38 C) OR HIGHER *CHILLS OR SWEATING *NAUSEA AND VOMITING THAT IS NOT CONTROLLED WITH YOUR NAUSEA MEDICATION *UNUSUAL SHORTNESS OF BREATH *UNUSUAL BRUISING OR BLEEDING *URINARY PROBLEMS (pain or burning when urinating, or frequent urination) *BOWEL PROBLEMS (unusual diarrhea, constipation, pain near the anus) TENDERNESS IN MOUTH AND THROAT WITH OR WITHOUT PRESENCE OF ULCERS (sore throat, sores in mouth, or a toothache) UNUSUAL RASH, SWELLING OR PAIN  UNUSUAL VAGINAL DISCHARGE OR ITCHING   Items with * indicate a potential emergency and should be followed up as soon as possible or go to the Emergency Department if any problems should occur.  Please show the CHEMOTHERAPY  ALERT CARD or IMMUNOTHERAPY ALERT CARD at check-in to the Emergency Department and triage nurse.  Should you have questions after your visit or need to cancel or reschedule your appointment, please contact [facility] - A DEPT OF [facility]  [phone] and follow the prompts.  Office hours are 8:00 a.m. to 4:30 p.m. Monday - Friday. Please note that voicemails left after 4:00 p.m. may not be returned until the following business day.  We are closed weekends and major holidays. You have access to a nurse at all times for urgent questions. Please call the main number to the clinic [phone] and follow the prompts.  For any non-urgent questions, you may also contact your provider using MyChart. We now offer e-Visits for anyone 7 and older to request care online for non-urgent symptoms. For details visit [email or web address].   Also download the MyChart app! Go to the app store, search "MyChart", open the app, select , and log in with your MyChart username and password.   Bevacizumab Injection What is this medication? BEVACIZUMAB (be va SIZ yoo mab) treats some types of cancer. It works by blocking a protein that causes cancer cells to grow and multiply. This helps to slow or stop the spread of cancer cells. It is a monoclonal antibody. This medicine may be used for other purposes; ask your health care provider or pharmacist if you have questions. COMMON BRAND NAME(S): Alymsys, Avastin, MVASI, Rosaland Lao What should I tell my care team before I take this medication? They need to know if you  have any of these conditions: Blood clots Coughing up blood Having or recent surgery Heart failure High blood pressure History of a connection between 2 or more body parts that do not usually connect (fistula) History of a tear in your stomach or intestines Protein in your urine An unusual or allergic reaction to bevacizumab, other medications,  foods, dyes, or preservatives Pregnant or trying to get pregnant Breast-feeding How should I use this medication? This medication is injected into a vein. It is given by your care team in a hospital or clinic setting. Talk to your care team the use of this medication in children. Special care may be needed. Overdosage: If you think you have taken too much of this medicine contact a poison control center or emergency room at once. NOTE: This medicine is only for you. Do not share this medicine with others. What if I miss a dose? Keep appointments for follow-up doses. It is important not to miss your dose. Call your care team if you are unable to keep an appointment. What may interact with this medication? Interactions are not expected. This list may not describe all possible interactions. Give your health care provider a list of all the medicines, herbs, non-prescription drugs, or dietary supplements you use. Also tell them if you smoke, drink alcohol, or use illegal drugs. Some items may interact with your medicine. What should I watch for while using this medication? Your condition will be monitored carefully while you are receiving this medication. You may need blood work while taking this medication. This medication may make you feel generally unwell. This is not uncommon as chemotherapy can affect healthy cells as well as cancer cells. Report any side effects. Continue your course of treatment even though you feel ill unless your care team tells you to stop. This medication may increase your risk to bruise or bleed. Call your care team if you notice any unusual bleeding. Before having surgery, talk to your care team to make sure it is ok. This medication can increase the risk of poor healing of your surgical site or wound. You will need to stop this medication for 28 days before surgery. After surgery, wait at least 28 days before restarting this medication. Make sure the surgical site or wound  is healed enough before restarting this medication. Talk to your care team if questions. Talk to your care team if you may be pregnant. Serious birth defects can occur if you take this medication during pregnancy and for 6 months after the last dose. Contraception is recommended while taking this medication and for 6 months after the last dose. Your care team can help you find the option that works for you. Do not breastfeed while taking this medication and for 6 months after the last dose. This medication can cause infertility. Talk to your care team if you are concerned about your fertility. What side effects may I notice from receiving this medication? Side effects that you should report to your care team as soon as possible: Allergic reactions--skin rash, itching, hives, swelling of the face, lips, tongue, or throat Bleeding--bloody or black, tar-like stools, vomiting blood or brown material that looks like coffee grounds, red or dark brown urine, small red or purple spots on skin, unusual bruising or bleeding Blood clot--pain, swelling, or warmth in the leg, shortness of breath, chest pain Heart attack--pain or tightness in the chest, shoulders, arms, or jaw, nausea, shortness of breath, cold or clammy skin, feeling faint or lightheaded Heart failure--shortness  of breath, swelling of the ankles, feet, or hands, sudden weight gain, unusual weakness or fatigue Increase in blood pressure Infection--fever, chills, cough, sore throat, wounds that don't heal, pain or trouble when passing urine, general feeling of discomfort or being unwell Infusion reactions--chest pain, shortness of breath or trouble breathing, feeling faint or lightheaded Kidney injury--decrease in the amount of urine, swelling of the ankles, hands, or feet Stomach pain that is severe, does not go away, or gets worse Stroke--sudden numbness or weakness of the face, arm, or leg, trouble speaking, confusion, trouble walking, loss of  balance or coordination, dizziness, severe headache, change in vision Sudden and severe headache, confusion, change in vision, seizures, which may be signs of posterior reversible encephalopathy syndrome (PRES) Side effects that usually do not require medical attention (report to your care team if they continue or are bothersome): Back pain Change in taste Diarrhea Dry skin Increased tears Nosebleed This list may not describe all possible side effects. Call your doctor for medical advice about side effects. You may report side effects to FDA at 1-800-FDA-1088. Where should I keep my medication? This medication is given in a hospital or clinic. It will not be stored at home. NOTE: This sheet is a summary. It may not cover all possible information. If you have questions about this medicine, talk to your doctor, pharmacist, or health care provider.  2024 Elsevier/Gold Standard (2021-08-19 00:00:00)  Irinotecan Injection What is this medication? IRINOTECAN (ir in oh TEE kan) treats some types of cancer. It works by slowing down the growth of cancer cells. This medicine may be used for other purposes; ask your health care provider or pharmacist if you have questions. COMMON BRAND NAME(S): Camptosar What should I tell my care team before I take this medication? They need to know if you have any of these conditions: Dehydration Diarrhea Infection, especially a viral infection, such as chickenpox, cold sores, herpes Liver disease Low blood cell levels (white cells, red cells, and platelets) Low levels of electrolytes, such as calcium, magnesium, or potassium in your blood Recent or ongoing radiation An unusual or allergic reaction to irinotecan, other medications, foods, dyes, or preservatives If you or your partner are pregnant or trying to get pregnant Breast-feeding How should I use this medication? This medication is injected into a vein. It is given by your care team in a hospital or  clinic setting. Talk to your care team about the use of this medication in children. Special care may be needed. Overdosage: If you think you have taken too much of this medicine contact a poison control center or emergency room at once. NOTE: This medicine is only for you. Do not share this medicine with others. What if I miss a dose? Keep appointments for follow-up doses. It is important not to miss your dose. Call your care team if you are unable to keep an appointment. What may interact with this medication? Do not take this medication with any of the following: Cobicistat Itraconazole This medication may also interact with the following: Certain antibiotics, such as clarithromycin, rifampin, rifabutin Certain antivirals for HIV or AIDS Certain medications for fungal infections, such as ketoconazole, posaconazole, voriconazole Certain medications for seizures, such as carbamazepine, phenobarbital, phenytoin Gemfibrozil Nefazodone St. John's wort This list may not describe all possible interactions. Give your health care provider a list of all the medicines, herbs, non-prescription drugs, or dietary supplements you use. Also tell them if you smoke, drink alcohol, or use illegal drugs. Some items  may interact with your medicine. What should I watch for while using this medication? Your condition will be monitored carefully while you are receiving this medication. You may need blood work while taking this medication. This medication may make you feel generally unwell. This is not uncommon as chemotherapy can affect healthy cells as well as cancer cells. Report any side effects. Continue your course of treatment even though you feel ill unless your care team tells you to stop. This medication can cause serious side effects. To reduce the risk, your care team may give you other medications to take before receiving this one. Be sure to follow the directions from your care team. This medication  may affect your coordination, reaction time, or judgement. Do not drive or operate machinery until you know how this medication affects you. Sit up or stand slowly to reduce the risk of dizzy or fainting spells. Drinking alcohol with this medication can increase the risk of these side effects. This medication may increase your risk of getting an infection. Call your care team for advice if you get a fever, chills, sore throat, or other symptoms of a cold or flu. Do not treat yourself. Try to avoid being around people who are sick. Avoid taking medications that contain aspirin, acetaminophen, ibuprofen, naproxen, or ketoprofen unless instructed by your care team. These medications may hide a fever. This medication may increase your risk to bruise or bleed. Call your care team if you notice any unusual bleeding. Be careful brushing or flossing your teeth or using a toothpick because you may get an infection or bleed more easily. If you have any dental work done, tell your dentist you are receiving this medication. Talk to your care team if you or your partner are pregnant or think either of you might be pregnant. This medication can cause serious birth defects if taken during pregnancy and for 6 months after the last dose. You will need a negative pregnancy test before starting this medication. Contraception is recommended while taking this medication and for 6 months after the last dose. Your care team can help you find the option that works for you. Do not father a child while taking this medication and for 3 months after the last dose. Use a condom for contraception during this time period. Do not breastfeed while taking this medication and for 7 days after the last dose. This medication may cause infertility. Talk to your care team if you are concerned about your fertility. What side effects may I notice from receiving this medication? Side effects that you should report to your care team as soon as  possible: Allergic reactions--skin rash, itching, hives, swelling of the face, lips, tongue, or throat Dry cough, shortness of breath or trouble breathing Increased saliva or tears, increased sweating, stomach cramping, diarrhea, small pupils, unusual weakness or fatigue, slow heartbeat Infection--fever, chills, cough, sore throat, wounds that don't heal, pain or trouble when passing urine, general feeling of discomfort or being unwell Kidney injury--decrease in the amount of urine, swelling of the ankles, hands, or feet Low red blood cell level--unusual weakness or fatigue, dizziness, headache, trouble breathing Severe or prolonged diarrhea Unusual bruising or bleeding Side effects that usually do not require medical attention (report to your care team if they continue or are bothersome): Constipation Diarrhea Hair loss Loss of appetite Nausea Stomach pain This list may not describe all possible side effects. Call your doctor for medical advice about side effects. You may report side effects to  FDA at 1-800-FDA-1088. Where should I keep my medication? This medication is given in a hospital or clinic. It will not be stored at home. NOTE: This sheet is a summary. It may not cover all possible information. If you have questions about this medicine, talk to your doctor, pharmacist, or health care provider.  2024 Elsevier/Gold Standard (2021-08-15 00:00:00)   Leucovorin Injection What is this medication? LEUCOVORIN (loo koe VOR in) prevents side effects from certain medications, such as methotrexate. It works by increasing folate levels. This helps protect healthy cells in your body. It may also be used to treat anemia caused by low levels of folate. It can also be used with fluorouracil, a type of chemotherapy, to treat colorectal cancer. It works by increasing the effects of fluorouracil in the body. This medicine may be used for other purposes; ask your health care provider or pharmacist if  you have questions. What should I tell my care team before I take this medication? They need to know if you have any of these conditions: Anemia from low levels of vitamin B12 in the blood An unusual or allergic reaction to leucovorin, folic acid, other medications, foods, dyes, or preservatives Pregnant or trying to get pregnant Breastfeeding How should I use this medication? This medication is injected into a vein or a muscle. It is given by your care team in a hospital or clinic setting. Talk to your care team about the use of this medication in children. Special care may be needed. Overdosage: If you think you have taken too much of this medicine contact a poison control center or emergency room at once. NOTE: This medicine is only for you. Do not share this medicine with others. What if I miss a dose? Keep appointments for follow-up doses. It is important not to miss your dose. Call your care team if you are unable to keep an appointment. What may interact with this medication? Capecitabine Fluorouracil Phenobarbital Phenytoin Primidone Trimethoprim;sulfamethoxazole This list may not describe all possible interactions. Give your health care provider a list of all the medicines, herbs, non-prescription drugs, or dietary supplements you use. Also tell them if you smoke, drink alcohol, or use illegal drugs. Some items may interact with your medicine. What should I watch for while using this medication? Your condition will be monitored carefully while you are receiving this medication. This medication may increase the side effects of 5-fluorouracil. Tell your care team if you have diarrhea or mouth sores that do not get better or that get worse. What side effects may I notice from receiving this medication? Side effects that you should report to your care team as soon as possible: Allergic reactions--skin rash, itching, hives, swelling of the face, lips, tongue, or throat This list may  not describe all possible side effects. Call your doctor for medical advice about side effects. You may report side effects to FDA at 1-800-FDA-1088. Where should I keep my medication? This medication is given in a hospital or clinic. It will not be stored at home. NOTE: This sheet is a summary. It may not cover all possible information. If you have questions about this medicine, talk to your doctor, pharmacist, or health care provider.  2024 Elsevier/Gold Standard (2021-09-06 00:00:00)  Fluorouracil Injection What is this medication? FLUOROURACIL (flure oh YOOR a sil) treats some types of cancer. It works by slowing down the growth of cancer cells. This medicine may be used for other purposes; ask your health care provider or pharmacist if you  have questions. COMMON BRAND NAME(S): Adrucil What should I tell my care team before I take this medication? They need to know if you have any of these conditions: Blood disorders Dihydropyrimidine dehydrogenase (DPD) deficiency Infection, such as chickenpox, cold sores, herpes Kidney disease Liver disease Poor nutrition Recent or ongoing radiation therapy An unusual or allergic reaction to fluorouracil, other medications, foods, dyes, or preservatives If you or your partner are pregnant or trying to get pregnant Breast-feeding How should I use this medication? This medication is injected into a vein. It is administered by your care team in a hospital or clinic setting. Talk to your care team about the use of this medication in children. Special care may be needed. Overdosage: If you think you have taken too much of this medicine contact a poison control center or emergency room at once. NOTE: This medicine is only for you. Do not share this medicine with others. What if I miss a dose? Keep appointments for follow-up doses. It is important not to miss your dose. Call your care team if you are unable to keep an appointment. What may interact with  this medication? Do not take this medication with any of the following: Live virus vaccines This medication may also interact with the following: Medications that treat or prevent blood clots, such as warfarin, enoxaparin, dalteparin This list may not describe all possible interactions. Give your health care provider a list of all the medicines, herbs, non-prescription drugs, or dietary supplements you use. Also tell them if you smoke, drink alcohol, or use illegal drugs. Some items may interact with your medicine. What should I watch for while using this medication? Your condition will be monitored carefully while you are receiving this medication. This medication may make you feel generally unwell. This is not uncommon as chemotherapy can affect healthy cells as well as cancer cells. Report any side effects. Continue your course of treatment even though you feel ill unless your care team tells you to stop. In some cases, you may be given additional medications to help with side effects. Follow all directions for their use. This medication may increase your risk of getting an infection. Call your care team for advice if you get a fever, chills, sore throat, or other symptoms of a cold or flu. Do not treat yourself. Try to avoid being around people who are sick. This medication may increase your risk to bruise or bleed. Call your care team if you notice any unusual bleeding. Be careful brushing or flossing your teeth or using a toothpick because you may get an infection or bleed more easily. If you have any dental work done, tell your dentist you are receiving this medication. Avoid taking medications that contain aspirin, acetaminophen, ibuprofen, naproxen, or ketoprofen unless instructed by your care team. These medications may hide a fever. Do not treat diarrhea with over the counter products. Contact your care team if you have diarrhea that lasts more than 2 days or if it is severe and  watery. This medication can make you more sensitive to the sun. Keep out of the sun. If you cannot avoid being in the sun, wear protective clothing and sunscreen. Do not use sun lamps, tanning beds, or tanning booths. Talk to your care team if you or your partner wish to become pregnant or think you might be pregnant. This medication can cause serious birth defects if taken during pregnancy and for 3 months after the last dose. A reliable form of contraception is  recommended while taking this medication and for 3 months after the last dose. Talk to your care team about effective forms of contraception. Do not father a child while taking this medication and for 3 months after the last dose. Use a condom while having sex during this time period. Do not breastfeed while taking this medication. This medication may cause infertility. Talk to your care team if you are concerned about your fertility. What side effects may I notice from receiving this medication? Side effects that you should report to your care team as soon as possible: Allergic reactions--skin rash, itching, hives, swelling of the face, lips, tongue, or throat Heart attack--pain or tightness in the chest, shoulders, arms, or jaw, nausea, shortness of breath, cold or clammy skin, feeling faint or lightheaded Heart failure--shortness of breath, swelling of the ankles, feet, or hands, sudden weight gain, unusual weakness or fatigue Heart rhythm changes--fast or irregular heartbeat, dizziness, feeling faint or lightheaded, chest pain, trouble breathing High ammonia level--unusual weakness or fatigue, confusion, loss of appetite, nausea, vomiting, seizures Infection--fever, chills, cough, sore throat, wounds that don't heal, pain or trouble when passing urine, general feeling of discomfort or being unwell Low red blood cell level--unusual weakness or fatigue, dizziness, headache, trouble breathing Pain, tingling, or numbness in the hands or feet,  muscle weakness, change in vision, confusion or trouble speaking, loss of balance or coordination, trouble walking, seizures Redness, swelling, and blistering of the skin over hands and feet Severe or prolonged diarrhea Unusual bruising or bleeding Side effects that usually do not require medical attention (report to your care team if they continue or are bothersome): Dry skin Headache Increased tears Nausea Pain, redness, or swelling with sores inside the mouth or throat Sensitivity to light Vomiting This list may not describe all possible side effects. Call your doctor for medical advice about side effects. You may report side effects to FDA at 1-800-FDA-1088. Where should I keep my medication? This medication is given in a hospital or clinic. It will not be stored at home. NOTE: This sheet is a summary. It may not cover all possible information. If you have questions about this medicine, talk to your doctor, pharmacist, or health care provider.  2024 Elsevier/Gold Standard (2021-08-09 00:00:00)

## 2023-07-25 NOTE — Progress Notes (Signed)
 Radiation Oncology Follow up Note  Name: Connor Wilkinson   Date:   07/25/2023 MRN:  [id number] DOB: Sep 20, 1985    This 38 y.o. male presents to the clinic today for 1 month follow-up status post.  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  Seen today in routine follow-up he is doing well specifically [place] cough hemoptysis chest tightness or any change in his pulmonary status.Marland Kitchen  He is currently on cycle 12 of palliative FOLFIRI and Avastin chemotherapy which she is tolerating well.  His CEA has been trending downward.  He is having no symptoms at this time which I would consider and the need for palliative treatment.  COMPLICATIONS OF TREATMENT: none  FOLLOW UP COMPLIANCE: keeps appointments   PHYSICAL EXAM:  There were no vitals taken for this visit. Well-developed well-nourished patient in NAD. HEENT reveals PERLA, EOMI, discs not visualized.  Oral cavity is clear. No oral mucosal lesions are identified. Neck is clear without evidence of cervical or supraclavicular adenopathy. Lungs are clear to A&P. Cardiac examination is essentially unremarkable with regular rate and rhythm without murmur rub or thrill. Abdomen is benign with no organomegaly or masses noted. Motor sensory and DTR levels are equal and symmetric in the upper and lower extremities. Cranial nerves II through XII are grossly intact. Proprioception is intact. No peripheral adenopathy or edema is identified. No motor or sensory levels are noted. Crude visual fields are within normal range.  RADIOLOGY RESULTS: No current films to review  PLAN: At the present time I will turn follow-up care over to medical oncology.  He continues palliative treatment through their  department.  I be happy to reevaluate the patient anytime should further palliative treatment be indicated.  Patient knows to call with any concerns.  I would like to take this opportunity to thank you for allowing me to participate in the care of your patient.Carmina Miller, MD

## 2023-07-26 ENCOUNTER — Other Ambulatory Visit: Payer: Self-pay

## 2023-07-27 ENCOUNTER — Inpatient Hospital Stay: Payer: MEDICAID

## 2023-07-27 DIAGNOSIS — C189 Malignant neoplasm of colon, unspecified: Secondary | ICD-10-CM

## 2023-07-27 DIAGNOSIS — Z5111 Encounter for antineoplastic chemotherapy: Secondary | ICD-10-CM | POA: Diagnosis not present

## 2023-07-27 MED ORDER — SODIUM CHLORIDE 0.9% FLUSH
10.0000 mL | INTRAVENOUS | Status: DC | PRN
  Administered 2023-07-27: 10 mL
  Filled 2023-07-27: qty 10

## 2023-07-27 MED ORDER — HEPARIN SOD (PORK) LOCK FLUSH 100 UNIT/ML IV SOLN
500.0000 [IU] | Freq: Once | INTRAVENOUS | Status: AC | PRN
Start: 2023-07-27 — End: 2023-07-27
  Administered 2023-07-27: 500 [IU]
  Filled 2023-07-27: qty 5

## 2023-08-01 ENCOUNTER — Ambulatory Visit
Admission: RE | Admit: 2023-08-01 | Discharge: 2023-08-01 | Disposition: A | Discharge status: Home / Self Care | Payer: MEDICAID | Source: Ambulatory Visit | Attending: Oncology | Admitting: Oncology

## 2023-08-01 DIAGNOSIS — C189 Malignant neoplasm of colon, unspecified: Secondary | ICD-10-CM

## 2023-08-01 MED ORDER — IOHEXOL 300 MG/ML  SOLN
100.0000 mL | Freq: Once | INTRAMUSCULAR | Status: AC | PRN
  Administered 2023-08-01: 100 mL via INTRAVENOUS

## 2023-08-15 ENCOUNTER — Inpatient Hospital Stay: Payer: MEDICAID | Admitting: Oncology

## 2023-08-15 ENCOUNTER — Encounter: Payer: Self-pay | Admitting: Oncology

## 2023-08-15 DIAGNOSIS — Z5111 Encounter for antineoplastic chemotherapy: Secondary | ICD-10-CM | POA: Diagnosis not present

## 2023-08-15 DIAGNOSIS — Z7189 Other specified counseling: Secondary | ICD-10-CM

## 2023-08-15 DIAGNOSIS — C189 Malignant neoplasm of colon, unspecified: Secondary | ICD-10-CM | POA: Diagnosis not present

## 2023-08-15 NOTE — Progress Notes (Signed)
 Hematology/Oncology Consult note [facility]  Telephone:([phone] [phone]  Patient Care Team: Annelle Kiel, MD as PCP - General (Internal Medicine) Avonne Boettcher, MD as Consulting Physician (Hematology and Oncology) Alben Alma, MD as Consulting Physician (General Surgery) Selena Daily, MD as Consulting Physician (Gastroenterology) Rochell Chroman, RN as Oncology Nurse Navigator Glenis Langdon, MD as Consulting Physician (Radiation Oncology)   Name of the patient: Connor Wilkinson  [id number]  1985-08-13   Date of visit: 08/15/23  Diagnosis-  metastatic colon adenocarcinoma with lung metastases and retroperitoneal adenopathy   Chief complaint/ Reason for visit-discuss CT scan results and further management  Heme/Onc history: patient is a 38 year old male with a past medical history significant for mild bipolar disorder who is a ward of the stateAnd was admitted to the hospital for severe anemia.  He was found to have a hemoglobin of 4.5/18.7 on admission.  He received blood transfusion as well as IV iron  and his hemoglobin is presently up to an 8.  He underwent colonoscopy which showed malignant partially obstructing tumor that was circumferential 80 cm proximal to the anus.  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  CT chest abdomen and pelvis with contrast did not show any evidence of distant metastatic disease.  2 small sclerotic densities in the acetabulum and proximal right femur likely benign process.  Pathologically enlarged lymph nodes superior to circumferential colonic lesion suggesting metastatic adenopathy.   Patient underwent right hemicolectomy with Dr. Dana Duncan on 07/12/2021.  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  Vermiform  appendix negative for tumor.  Radial margin positive for tumor (2 lymph nodes at margin with adenocarcinoma).  Proximal and distal margins negative.  7 out of 56 lymph nodes positive for metastatic adenocarcinoma.  Tumor deposits not applicable.  No lymphovascular or perineural invasion seen.  PT4APN2B.  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  BRAF testing was negative.  Genetic testing was offered to the patient but he declined   Fertility conservation was also discussed an option of sperm banking at [facility] was discussed with the patient and his healthcare power of attorney Cleola Dach.  There would be a one-time cost involved for sperm banking and a yearly cost for preserving the specimen.  Overall chances of infertility with FOLFOX is low but possible.  Patient and his healthcare power of attorney have discussed their options and have decided not to proceed with fertility conservation at this time   Patient completed 12 cycles of adjuvant FOLFOX chemotherapy in followed by adjuvant radiation therapy in September 2023    CT scan and PET scan showed concern for metastatic disease.  18 x 16 mm left lower lobe lung nodule with an SUV of 2.4.  Progressive abdominal/retroperitoneal adenopathy with lymph nodes measuring between 6 mm to 18 mm which were PET avid.  Lung biopsy was negative for malignancy.  Retroperitoneal adenopathy was biopsied and was consistent with mucinous adenocarcinoma of colon primary.  Tissue Tempus testing ATM, BRCA2, PIK3CA mutation.  MLH1 mutation. No evidence of K-ras BRAF or NRAS mutation.  Tumor mutational burden high 95th percentile MSI status could not be assessed because the tumor content is below 30%   Patient received 12 cycles of palliative FOLFIRI Avastin  chemotherapy ending in April 2025.  He also received radiation  treatment/SBRT to the dominant left lower lobe lung nodules    Interval history-he feels  well overall and denies any specific complaints at this time  ECOG PS- 0 Pain scale- 0   Review of systems- Review of Systems  Constitutional:  Negative for chills, fever, malaise/fatigue and weight loss.  HENT:  Negative for congestion, ear discharge and nosebleeds.   Eyes:  Negative for blurred vision.  Respiratory:  Negative for cough, hemoptysis, sputum production, shortness of breath and wheezing.   Cardiovascular:  Negative for chest pain, palpitations, orthopnea and claudication.  Gastrointestinal:  Negative for abdominal pain, blood in stool, constipation, diarrhea, heartburn, melena, nausea and vomiting.  Genitourinary:  Negative for dysuria, flank pain, frequency, hematuria and urgency.  Musculoskeletal:  Negative for back pain, joint pain and myalgias.  Skin:  Negative for rash.  Neurological:  Negative for dizziness, tingling, focal weakness, seizures, weakness and headaches.  Endo/Heme/Allergies:  Does not bruise/bleed easily.  Psychiatric/Behavioral:  Negative for depression and suicidal ideas. The patient does not have insomnia.       Allergies  Allergen Reactions   Geodon [Ziprasidone Hcl] Anaphylaxis     Past Medical History:  Diagnosis Date   Bipolar 1 disorder (HCC)    Colon cancer (HCC)    Lung nodule 09/2022   PTSD (post-traumatic stress disorder)      Past Surgical History:  Procedure Laterality Date   BRONCHIAL NEEDLE ASPIRATION BIOPSY  10/12/2022   Procedure: BRONCHIAL NEEDLE ASPIRATION BIOPSIES;  Surgeon: Vergia Glasgow, MD;  Location: [facility];  Service: Pulmonary;;   COLON RESECTION N/A 07/12/2021   Procedure: HAND ASSISTED LAPAROSCOPIC COLON RESECTION;  Surgeon: Alben Alma, MD;  Location: [facility] ORS;  Service: General;  Laterality: N/A;   COLONOSCOPY WITH PROPOFOL  N/A 07/06/2021   Procedure: COLONOSCOPY WITH PROPOFOL ;  Surgeon: Selena Daily, MD;  Location: [facility] ENDOSCOPY;  Service: Gastroenterology;  Laterality: N/A;   COLONOSCOPY  WITH PROPOFOL  N/A 07/07/2021   Procedure: COLONOSCOPY WITH PROPOFOL ;  Surgeon: Selena Daily, MD;  Location: [facility] ENDOSCOPY;  Service: Gastroenterology;  Laterality: N/A;   COLONOSCOPY WITH PROPOFOL  N/A 08/02/2022   Procedure: COLONOSCOPY WITH PROPOFOL ;  Surgeon: Selena Daily, MD;  Location: [facility] ENDOSCOPY;  Service: Gastroenterology;  Laterality: N/ADoroteo Gasmen (Caregiver & Transporter) [phone] Cleola Dach [phone] will be available for phone consent   ESOPHAGOGASTRODUODENOSCOPY (EGD) WITH PROPOFOL  N/A 07/06/2021   Procedure: ESOPHAGOGASTRODUODENOSCOPY (EGD) WITH PROPOFOL ;  Surgeon: Selena Daily, MD;  Location: [facility] ENDOSCOPY;  Service: Gastroenterology;  Laterality: N/A;   GIVENS CAPSULE STUDY N/A 07/06/2021   Procedure: GIVENS CAPSULE STUDY;  Surgeon: Selena Daily, MD;  Location: [facility] ENDOSCOPY;  Service: Gastroenterology;  Laterality: N/A;   IR IMAGING GUIDED PORT INSERTION  01/19/2023   IR REMOVAL TUN ACCESS W/ PORT W/O FL MOD SED  02/15/2022   PORTACATH PLACEMENT Right 07/22/2021   Procedure: INSERTION PORT-A-CATH;  Surgeon: Alben Alma, MD;  Location: [facility] ORS;  Service: General;  Laterality: Right;    Social History   Socioeconomic History   Marital status: Single    Spouse name: Not on file   Number of children: Not on file   Years of education: Not on file   Highest education level: Not on file  Occupational History   Not on file  Tobacco Use   Smoking status: Every Day    Current packs/day: 0.25    Types: Cigarettes   Smokeless tobacco: Current   Tobacco comments:    4 cigarettes a day -  khj 09/28/2022  Vaping Use   Vaping status: Every Day   Devices: elfbar  Substance and Sexual Activity   Alcohol use: Not Currently   Drug use: Not Currently   Sexual activity: Not Currently  Other Topics Concern   Not on file  Social History Narrative   Not on file   Social Drivers of Health   Financial Resource Strain: Not on file  Food  Insecurity: Not on file  Transportation Needs: Not on file  Physical Activity: Not on file  Stress: Not on file  Social Connections: Not on file  Intimate Partner Violence: Not on file    Family History  Problem Relation Age of Onset   Kidney cancer Father    Breast cancer Maternal Aunt      Current Outpatient Medications:    acetaminophen  (TYLENOL ) 325 MG tablet, Take 2 tablets (650 mg total) by mouth every 6 (six) hours as needed for mild pain or moderate pain., Disp: 30 tablet, Rfl: 0   ARIPiprazole  (ABILIFY ) 10 MG tablet, Take 10 mg by mouth in the morning. (Patient not taking: Reported on 05/30/2023), Disp: , Rfl:    ARIPiprazole  ER (ABILIFY  MAINTENA) 400 MG PRSY prefilled syringe, Inject 400 mg into the muscle every 30 (thirty) days., Disp: , Rfl:    benztropine  (COGENTIN ) 2 MG tablet, Take 2 mg by mouth at bedtime., Disp: , Rfl:    busPIRone  (BUSPAR ) 10 MG tablet, Take 10 mg by mouth 2 (two) times daily., Disp: , Rfl:    FLUoxetine  (PROZAC ) 20 MG capsule, Take 20 mg by mouth daily., Disp: , Rfl:    ibuprofen  (ADVIL ) 600 MG tablet, Take 600 mg by mouth every 6 (six) hours as needed for moderate pain., Disp: , Rfl:    ibuprofen  (ADVIL ) 800 MG tablet, Take 1 tablet (800 mg total) by mouth every 8 (eight) hours as needed., Disp: 30 tablet, Rfl: 0   lidocaine -prilocaine  (EMLA ) cream, Apply 1 Application topically See admin instructions. Apply to affected area once as directed 1 hour before the pt. Comes to get chemo, Disp: 30 g, Rfl: 2   Melatonin 10 MG CAPS, Take 10 mg by mouth at bedtime., Disp: , Rfl:    ondansetron  (ZOFRAN ) 8 MG tablet, Take 8 mg by mouth 2 (two) times daily as needed for nausea or vomiting., Disp: , Rfl:    oxyCODONE  (OXY IR/ROXICODONE ) 5 MG immediate release tablet, Take 5 mg by mouth every 6 (six) hours as needed for severe pain or breakthrough pain. (Patient not taking: Reported on 03/21/2023), Disp: , Rfl:    pantoprazole  (PROTONIX ) 20 MG tablet, TAKE 1 TABLET BY  MOUTH ONCE DAILY *DO NOT CRUSH OR CHEW*, Disp: 24 tablet, Rfl: 10   polyethylene glycol (MIRALAX  / GLYCOLAX ) 17 g packet, Take 17 g by mouth daily as needed (constipation)., Disp: 14 each, Rfl: 0   prazosin  (MINIPRESS ) 2 MG capsule, Take 2 mg by mouth 2 (two) times daily., Disp: , Rfl:    prochlorperazine  (COMPAZINE ) 10 MG tablet, Take 10 mg by mouth every 6 (six) hours as needed for nausea or vomiting., Disp: , Rfl:   Physical exam:  Vitals:   08/15/23 1041  BP: (!) 137/95  Pulse: 100  Resp: 18  Temp: (!) 96 F (35.6 C)  TempSrc: Tympanic  SpO2: 99%  Weight: 198 lb 12.8 oz (90.2 kg)  Height: 6' (1.829 m)   Physical Exam Cardiovascular:     Rate and Rhythm: Normal rate and regular rhythm.  Heart sounds: Normal heart sounds.  Pulmonary:     Effort: Pulmonary effort is normal.     Breath sounds: Normal breath sounds.  Abdominal:     General: Bowel sounds are normal.     Palpations: Abdomen is soft.  Skin:    General: Skin is warm and dry.  Neurological:     Mental Status: He is alert and oriented to person, place, and time.      I have personally reviewed labs listed below:    Latest Ref Rng & Units 07/25/2023    9:25 AM  CMP  Glucose 70 - 99 mg/dL 78   BUN 6 - 20 mg/dL 11   Creatinine 1.61 - 1.24 mg/dL 0.96   Sodium 045 - 409 mmol/L 139   Potassium 3.5 - 5.1 mmol/L 3.4   Chloride 98 - 111 mmol/L 111   CO2 22 - 32 mmol/L 21   Calcium  8.9 - 10.3 mg/dL 7.3   Total Protein 6.5 - 8.1 g/dL 5.6   Total Bilirubin 0.0 - 1.2 mg/dL 0.7   Alkaline Phos 38 - 126 U/L 51   AST 15 - 41 U/L 19   ALT 0 - 44 U/L 11       Latest Ref Rng & Units 07/25/2023    9:25 AM  CBC  WBC 4.0 - 10.5 K/uL 4.5   Hemoglobin 13.0 - 17.0 g/dL 81.1   Hematocrit 91.4 - 52.0 % 40.0   Platelets 150 - 400 K/uL 203    I have personally reviewed Radiology images listed below: No images are attached to the encounter.  CT CHEST ABDOMEN PELVIS W CONTRAST Result Date: 08/05/2023 CLINICAL DATA:   History of metastatic colon cancer, follow-up. * Tracking Code: BO * EXAM: CT CHEST, ABDOMEN, AND PELVIS WITH CONTRAST TECHNIQUE: Multidetector CT imaging of the chest, abdomen and pelvis was performed following the standard protocol during bolus administration of intravenous contrast. RADIATION DOSE REDUCTION: This exam was performed according to the departmental dose-optimization program which includes automated exposure control, adjustment of the mA and/or kV according to patient size and/or use of iterative reconstruction technique. CONTRAST:  OMNIPAQUE  IOHEXOL  300 MG/ML  SOLN COMPARISON:  Multiple priors including most recent CT April 20, 2023 FINDINGS: CT CHEST FINDINGS Cardiovascular: Right chest Port-A-Cath with tip near the superior cavoatrial junction. Normal caliber thoracic aorta. Normal size heart. No significant pericardial effusion/thickening. Mediastinum/Nodes: No suspicious thyroid nodule. No pathologically enlarged mediastinal, hilar or axillary lymph nodes. The esophagus is grossly unremarkable. Lungs/Pleura: Dominant solid pulmonary nodule in the left lower lobe measures 2.1 x 1.6 cm on image 119/4 previously 2.3 x 2.0 cm. -additional pulmonary nodule in medial left lower lobe measures 11 x 7 mm on image 117/4 previously 12 x 9 mm. No new suspicious pulmonary nodules or masses. Musculoskeletal: No aggressive lytic or blastic lesion of bone. CT ABDOMEN PELVIS FINDINGS Hepatobiliary: No suspicious hepatic lesion. Gallbladder is unremarkable. No biliary ductal dilation. Pancreas: No pancreatic ductal dilation or evidence of acute inflammation. Spleen: No splenomegaly. Adrenals/Urinary Tract: No suspicious adrenal nodule/mass. Kidneys demonstrate symmetric enhancement. No hydronephrosis. Urinary bladder is unremarkable for degree of distension. Stomach/Bowel: Prior right hemicolectomy with ileocolic anastomosis. No new suspicious nodularity along the suture line. No evidence of bowel  obstruction or acute bowel inflammation. Colonic stool burden compatible with constipation. Vascular/Lymphatic: Normal caliber abdominal aorta. Aortic atherosclerosis. Stable low-density retroperitoneal lymph nodes of which demonstrate internal calcifications for instance the left periaortic lymph node measuring 2.7 x 1.8 cm on image  83/2 previously measured 2.8 x 1.7 cm. No new or progressive abdominopelvic adenopathy identified. Reproductive: Prostate is unremarkable. Other: No significant abdominopelvic free fluid. Musculoskeletal: No aggressive lytic or blastic lesion of bone. IMPRESSION: 1. Stable to slight interval decrease in size of the metastatic left lower lobe pulmonary nodules. No new suspicious pulmonary nodules or masses. 2. Stable enlarged retroperitoneal lymph nodes some of which demonstrate internal calcifications. No new or progressive abdominopelvic adenopathy identified. 3. Prior right hemicolectomy with ileocolic anastomosis. No new suspicious nodularity along the suture line. 4. Colonic stool burden compatible with constipation. Electronically Signed   By: Tama Fails M.D.   On: 08/05/2023 09:43     Assessment and plan- Patient is a 38 y.o. male with history of metastatic colon cancer with lymph node and lung metastases here to discuss CT scan results and Further management  Patient has received 12 cycles of palliative FOLFIRI Avastin  chemotherapy between October 2024 to April 2025.  In October he was noted to have disease progression mainly with retroperitoneal adenopathy and 2 lung nodules.  Patient underwent radiation therapy to the 2 lung nodules.  I have reviewed CT chest abdomen and pelvis images independently and discussed findings with the patient which shows overall decrease in the size of the 2 lung nodules as well as stable size of retroperitoneal adenopathy.  I am therefore planning to hold off on further FOLFIRI Avastin  chemotherapy at this time.  We will keep him off  treatment and monitor labs including CEA every 6 weeks and I will see him back in 3 months with scans prior if there is evidence of disease progression I will plan to switch him to Keytruda given that he has MSI high disease and loss of PMS2 protein indicating good response to immunotherapy.  Patient will keep his port flushed every 3 months at this time   Visit Diagnosis 1. Goals of care, counseling/discussion   2. Adenocarcinoma of colon [facility])      Dr. Seretha Dance, MD, MPH [facility] at [facility] [phone] 08/15/2023 1:51 PM

## 2023-09-04 ENCOUNTER — Telehealth: Payer: Self-pay | Admitting: *Deleted

## 2023-09-04 NOTE — Telephone Encounter (Signed)
 Connor Wilkinson called and left 2 different phone numbers to talk to her.  Both phones I tried to call and both of them does not have ability to have to leave a message.

## 2023-09-06 ENCOUNTER — Telehealth: Payer: Self-pay | Admitting: *Deleted

## 2023-09-06 NOTE — Telephone Encounter (Signed)
 Connor Wilkinson wants to have Decadron  taken off his list and if that is okay with Dr. Randy Buttery to please call [facility] pharmacy and cancel the Decadron .  I told her that Dr. Jerome Moores is not here today but we will ask about that tomorrow.  And wanted will not be at work tomorrow but she gave me a lady that works there name Edwina Gram and her number is [phone] to make sure that it has been taken off, so we need to call them when we can take the med off his list

## 2023-09-06 NOTE — Telephone Encounter (Signed)
 Per Dr.Rao "Okto take decadron  off the list".  Decadron  is not shown as a current medication in his chart at this time.  Outbound call to [place] at [facility] [phone] to make sure that it has been taken off.  Edwina Gram stated she will let them know to remove it on their end.  No further follow up needed.

## 2023-09-06 NOTE — Telephone Encounter (Signed)
 Ok to take decadron  off the list

## 2023-09-26 ENCOUNTER — Inpatient Hospital Stay: Payer: MEDICAID | Attending: Oncology

## 2023-09-26 DIAGNOSIS — Z923 Personal history of irradiation: Secondary | ICD-10-CM | POA: Diagnosis not present

## 2023-09-26 DIAGNOSIS — C189 Malignant neoplasm of colon, unspecified: Secondary | ICD-10-CM | POA: Insufficient documentation

## 2023-09-26 DIAGNOSIS — C7802 Secondary malignant neoplasm of left lung: Secondary | ICD-10-CM | POA: Insufficient documentation

## 2023-09-26 DIAGNOSIS — C786 Secondary malignant neoplasm of retroperitoneum and peritoneum: Secondary | ICD-10-CM | POA: Diagnosis not present

## 2023-09-26 DIAGNOSIS — Z95828 Presence of other vascular implants and grafts: Secondary | ICD-10-CM

## 2023-09-26 LAB — CBC WITH DIFFERENTIAL (CANCER CENTER ONLY)
Abs Immature Granulocytes: 0.01 K/uL (ref 0.00–0.07)
Basophils Absolute: 0 K/uL (ref 0.0–0.1)
Basophils Relative: 1 %
Eosinophils Absolute: 0.2 K/uL (ref 0.0–0.5)
Eosinophils Relative: 4 %
HCT: 43.2 % (ref 39.0–52.0)
Hemoglobin: 13.8 g/dL (ref 13.0–17.0)
Immature Granulocytes: 0 %
Lymphocytes Relative: 21 %
Lymphs Abs: 1.1 K/uL (ref 0.7–4.0)
MCH: 29.3 pg (ref 26.0–34.0)
MCHC: 31.9 g/dL (ref 30.0–36.0)
MCV: 91.7 fL (ref 80.0–100.0)
Monocytes Absolute: 0.3 K/uL (ref 0.1–1.0)
Monocytes Relative: 5 %
Neutro Abs: 3.6 K/uL (ref 1.7–7.7)
Neutrophils Relative %: 69 %
Platelet Count: 237 K/uL (ref 150–400)
RBC: 4.71 MIL/uL (ref 4.22–5.81)
RDW: 13.6 % (ref 11.5–15.5)
WBC Count: 5.1 K/uL (ref 4.0–10.5)
nRBC: 0 % (ref 0.0–0.2)

## 2023-09-26 LAB — CMP (CANCER CENTER ONLY)
ALT: 13 U/L (ref 0–44)
AST: 23 U/L (ref 15–41)
Albumin: 3.9 g/dL (ref 3.5–5.0)
Alkaline Phosphatase: 61 U/L (ref 38–126)
Anion gap: 9 (ref 5–15)
BUN: 16 mg/dL (ref 6–20)
CO2: 22 mmol/L (ref 22–32)
Calcium: 9 mg/dL (ref 8.9–10.3)
Chloride: 105 mmol/L (ref 98–111)
Creatinine: 0.93 mg/dL (ref 0.61–1.24)
GFR, Estimated: >60 mL/min (ref >60)
Glucose, Bld: 105 mg/dL — ABNORMAL HIGH (ref 70–99)
Potassium: 4.2 mmol/L (ref 3.5–5.1)
Sodium: 136 mmol/L (ref 135–145)
Total Bilirubin: 0.8 mg/dL (ref 0.0–1.2)
Total Protein: 6.9 g/dL (ref 6.5–8.1)

## 2023-09-26 MED ORDER — HEPARIN SOD (PORK) LOCK FLUSH 100 UNIT/ML IV SOLN
500.0000 [IU] | Freq: Once | INTRAVENOUS | Status: AC
  Administered 2023-09-26: 500 [IU] via INTRAVENOUS
  Filled 2023-09-26: qty 5

## 2023-09-26 MED ORDER — SODIUM CHLORIDE 0.9% FLUSH
10.0000 mL | Freq: Once | INTRAVENOUS | Status: AC
  Administered 2023-09-26: 10 mL via INTRAVENOUS
  Filled 2023-09-26: qty 10

## 2023-09-27 LAB — CEA: CEA: 4.4 ng/mL (ref 0.0–4.7)

## 2023-10-05 ENCOUNTER — Other Ambulatory Visit: Payer: Self-pay | Admitting: Oncology

## 2023-10-05 DIAGNOSIS — C189 Malignant neoplasm of colon, unspecified: Secondary | ICD-10-CM

## 2023-10-05 NOTE — Telephone Encounter (Signed)
 Ibuprofen  per pcp and it is OTC. Agree not sure why he needs nausea meds but if he is nauseous, ok to refill those

## 2023-10-08 ENCOUNTER — Encounter: Payer: Self-pay | Admitting: Oncology

## 2023-10-08 NOTE — Telephone Encounter (Signed)
 Outbound call to patient who stated he is not in need of anti-nausea medications nor anti-diarrheal at this time.  Informed patient he can refer to PCP for Ibuprofen  800mg  or he can obtain lesser dose OTC with no script.  Patient verbalized understanding.

## 2023-11-12 ENCOUNTER — Ambulatory Visit
Admission: RE | Admit: 2023-11-12 | Discharge: 2023-11-12 | Disposition: A | Discharge status: Home / Self Care | Payer: MEDICAID | Source: Ambulatory Visit | Attending: Oncology | Admitting: Oncology

## 2023-11-12 DIAGNOSIS — C189 Malignant neoplasm of colon, unspecified: Secondary | ICD-10-CM | POA: Insufficient documentation

## 2023-11-12 MED ORDER — IOHEXOL 300 MG/ML  SOLN
100.0000 mL | Freq: Once | INTRAMUSCULAR | Status: AC | PRN
  Administered 2023-11-12: 100 mL via INTRAVENOUS

## 2023-11-26 ENCOUNTER — Encounter: Payer: Self-pay | Admitting: Oncology

## 2023-11-26 ENCOUNTER — Inpatient Hospital Stay: Payer: MEDICAID | Attending: Oncology

## 2023-11-26 ENCOUNTER — Inpatient Hospital Stay (HOSPITAL_BASED_OUTPATIENT_CLINIC_OR_DEPARTMENT_OTHER): Payer: MEDICAID | Admitting: Oncology

## 2023-11-26 ENCOUNTER — Other Ambulatory Visit: Payer: Self-pay

## 2023-11-26 VITALS — BP 112/82 | HR 84 | Temp 96.6°F | Resp 17 | Ht 72.0 in | Wt 205.1 lb

## 2023-11-26 DIAGNOSIS — F17290 Nicotine dependence, other tobacco product, uncomplicated: Secondary | ICD-10-CM | POA: Diagnosis not present

## 2023-11-26 DIAGNOSIS — F17210 Nicotine dependence, cigarettes, uncomplicated: Secondary | ICD-10-CM | POA: Diagnosis not present

## 2023-11-26 DIAGNOSIS — C786 Secondary malignant neoplasm of retroperitoneum and peritoneum: Secondary | ICD-10-CM

## 2023-11-26 DIAGNOSIS — Z8051 Family history of malignant neoplasm of kidney: Secondary | ICD-10-CM

## 2023-11-26 DIAGNOSIS — Z803 Family history of malignant neoplasm of breast: Secondary | ICD-10-CM | POA: Diagnosis not present

## 2023-11-26 DIAGNOSIS — C772 Secondary and unspecified malignant neoplasm of intra-abdominal lymph nodes: Secondary | ICD-10-CM | POA: Diagnosis not present

## 2023-11-26 DIAGNOSIS — Z923 Personal history of irradiation: Secondary | ICD-10-CM

## 2023-11-26 DIAGNOSIS — C7802 Secondary malignant neoplasm of left lung: Secondary | ICD-10-CM | POA: Diagnosis not present

## 2023-11-26 DIAGNOSIS — C189 Malignant neoplasm of colon, unspecified: Secondary | ICD-10-CM | POA: Diagnosis not present

## 2023-11-26 DIAGNOSIS — R590 Localized enlarged lymph nodes: Secondary | ICD-10-CM | POA: Insufficient documentation

## 2023-11-26 DIAGNOSIS — C7800 Secondary malignant neoplasm of unspecified lung: Secondary | ICD-10-CM

## 2023-11-26 DIAGNOSIS — Z9221 Personal history of antineoplastic chemotherapy: Secondary | ICD-10-CM | POA: Diagnosis not present

## 2023-11-26 DIAGNOSIS — Z08 Encounter for follow-up examination after completed treatment for malignant neoplasm: Secondary | ICD-10-CM

## 2023-11-26 LAB — CMP (CANCER CENTER ONLY)
ALT: 19 U/L (ref 0–44)
AST: 25 U/L (ref 15–41)
Albumin: 4 g/dL (ref 3.5–5.0)
Alkaline Phosphatase: 72 U/L (ref 38–126)
Anion gap: 10 (ref 5–15)
BUN: 10 mg/dL (ref 6–20)
CO2: 22 mmol/L (ref 22–32)
Calcium: 9.1 mg/dL (ref 8.9–10.3)
Chloride: 104 mmol/L (ref 98–111)
Creatinine: 0.98 mg/dL (ref 0.61–1.24)
GFR, Estimated: >60 mL/min (ref >60)
Glucose, Bld: 106 mg/dL — ABNORMAL HIGH (ref 70–99)
Potassium: 4.1 mmol/L (ref 3.5–5.1)
Sodium: 136 mmol/L (ref 135–145)
Total Bilirubin: 0.4 mg/dL (ref 0.0–1.2)
Total Protein: 7 g/dL (ref 6.5–8.1)

## 2023-11-26 LAB — CBC WITH DIFFERENTIAL (CANCER CENTER ONLY)
Abs Immature Granulocytes: 0.02 K/uL (ref 0.00–0.07)
Basophils Absolute: 0 K/uL (ref 0.0–0.1)
Basophils Relative: 0 %
Eosinophils Absolute: 0.2 K/uL (ref 0.0–0.5)
Eosinophils Relative: 3 %
HCT: 41.5 % (ref 39.0–52.0)
Hemoglobin: 13.7 g/dL (ref 13.0–17.0)
Immature Granulocytes: 0 %
Lymphocytes Relative: 23 %
Lymphs Abs: 1.4 K/uL (ref 0.7–4.0)
MCH: 29.8 pg (ref 26.0–34.0)
MCHC: 33 g/dL (ref 30.0–36.0)
MCV: 90.4 fL (ref 80.0–100.0)
Monocytes Absolute: 0.4 K/uL (ref 0.1–1.0)
Monocytes Relative: 7 %
Neutro Abs: 3.9 K/uL (ref 1.7–7.7)
Neutrophils Relative %: 67 %
Platelet Count: 241 K/uL (ref 150–400)
RBC: 4.59 MIL/uL (ref 4.22–5.81)
RDW: 13.3 % (ref 11.5–15.5)
WBC Count: 5.8 K/uL (ref 4.0–10.5)
nRBC: 0 % (ref 0.0–0.2)

## 2023-11-26 NOTE — Patient Instructions (Signed)

## 2023-11-26 NOTE — Progress Notes (Signed)
 CT done recently.  Has no questions / concerns / complaints at this time.

## 2023-11-26 NOTE — Progress Notes (Signed)
 Hematology/Oncology Consult note [facility]  [phone] [phone]  Patient Care Team: Weyman Bright, MD as PCP - General (Internal Medicine) Melanee Annah BROCKS, MD as Consulting Physician (Oncology) Jordis Laneta FALCON, MD as Consulting Physician (General Surgery) Unk Corinn Skiff, MD as Consulting Physician (Gastroenterology) Maurie Rayfield BIRCH, RN as Oncology Nurse Navigator Lenn Aran, MD as Consulting Physician (Radiation Oncology)   Name of the patient: Connor Wilkinson  [id number]  19-Nov-1985   Date of visit: 11/26/23  Diagnosis-  metastatic colon adenocarcinoma with lung metastases and retroperitoneal adenopathy     Chief complaint/ Reason for visit- routine f/u of colon cancer  Heme/Onc history: patient is a 38 year old male with a past medical history significant for mild bipolar disorder who is a ward of the stateAnd was admitted to the hospital for severe anemia.  He was found to have a hemoglobin of 4.5/18.7 on admission.  He received blood transfusion as well as IV iron  and his hemoglobin is presently up to an 8.  He underwent colonoscopy which showed malignant partially obstructing tumor that was circumferential 80 cm proximal to the anus.  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  CT chest abdomen and pelvis with contrast did not show any evidence of distant metastatic disease.  2 small sclerotic densities in the acetabulum and proximal right femur likely benign process.  Pathologically enlarged lymph nodes superior to circumferential colonic lesion suggesting metastatic adenopathy.   Patient underwent right hemicolectomy with Dr. Jordis on 07/12/2021.  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  Vermiform appendix negative for tumor.   Radial margin positive for tumor (2 lymph nodes at margin with adenocarcinoma).  Proximal and distal margins negative.  7 out of 56 lymph nodes positive for metastatic adenocarcinoma.  Tumor deposits not applicable.  No lymphovascular or perineural invasion seen.  PT4APN2B.  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  BRAF testing was negative.  Genetic testing was offered to the patient but he declined   Fertility conservation was also discussed an option of sperm banking at [facility] was discussed with the patient and his healthcare power of attorney [name].  There would be a one-time cost involved for sperm banking and a yearly cost for preserving the specimen.  Overall chances of infertility with FOLFOX is low but possible.  Patient and his healthcare power of attorney have discussed their options and have decided not to proceed with fertility conservation at this time   Patient completed 12 cycles of adjuvant FOLFOX chemotherapy in followed by adjuvant radiation therapy in September 2023    CT scan and PET scan showed concern for metastatic disease.  18 x 16 mm left lower lobe lung nodule with an SUV of 2.4.  Progressive abdominal/retroperitoneal adenopathy with lymph nodes measuring between 6 mm to 18 mm which were PET avid.  Lung biopsy was negative for malignancy.  Retroperitoneal adenopathy was biopsied and was consistent with mucinous adenocarcinoma of colon primary.  Tissue Tempus testing ATM, BRCA2, PIK3CA mutation.  MLH1 mutation. No evidence of K-ras BRAF or NRAS mutation.  Tumor mutational burden high 95th percentile MSI status could not be assessed because the tumor content is below 30%   Patient received 12 cycles of palliative FOLFIRI Avastin  chemotherapy ending in April 2025.  He also received radiation treatment/SBRT  to the dominant left lower lobe lung nodules.  Patient has been on treatment break since April 2025.      Interval history-patient is presently doing well.  Appetite and weight have improved.  Denies any complaints today  ECOG PS- 0 Pain scale- 0   Review of systems- Review of Systems  Constitutional:  Negative for chills, fever, malaise/fatigue and weight loss.  HENT:  Negative for congestion, ear discharge and nosebleeds.   Eyes:  Negative for blurred vision.  Respiratory:  Negative for cough, hemoptysis, sputum production, shortness of breath and wheezing.   Cardiovascular:  Negative for chest pain, palpitations, orthopnea and claudication.  Gastrointestinal:  Negative for abdominal pain, blood in stool, constipation, diarrhea, heartburn, melena, nausea and vomiting.  Genitourinary:  Negative for dysuria, flank pain, frequency, hematuria and urgency.  Musculoskeletal:  Negative for back pain, joint pain and myalgias.  Skin:  Negative for rash.  Neurological:  Negative for dizziness, tingling, focal weakness, seizures, weakness and headaches.  Endo/Heme/Allergies:  Does not bruise/bleed easily.  Psychiatric/Behavioral:  Negative for depression and suicidal ideas. The patient does not have insomnia.       Allergies  Allergen Reactions   Geodon [Ziprasidone Hcl] Anaphylaxis     Past Medical History:  Diagnosis Date   Bipolar 1 disorder (HCC)    Colon cancer (HCC)    Lung nodule 09/2022   PTSD (post-traumatic stress disorder)      Past Surgical History:  Procedure Laterality Date   BRONCHIAL NEEDLE ASPIRATION BIOPSY  10/12/2022   Procedure: BRONCHIAL NEEDLE ASPIRATION BIOPSIES;  Surgeon: Isadora Hose, MD;  Location: [facility] ENDOSCOPY;  Service: Pulmonary;;   COLON RESECTION N/A 07/12/2021   Procedure: HAND ASSISTED LAPAROSCOPIC COLON RESECTION;  Surgeon: Jordis Laneta FALCON, MD;  Location: [facility] ORS;  Service: General;  Laterality: N/A;   COLONOSCOPY WITH PROPOFOL  N/A 07/06/2021   Procedure: COLONOSCOPY WITH PROPOFOL ;  Surgeon: Unk Corinn Skiff, MD;  Location: [facility] ENDOSCOPY;   Service: Gastroenterology;  Laterality: N/A;   COLONOSCOPY WITH PROPOFOL  N/A 07/07/2021   Procedure: COLONOSCOPY WITH PROPOFOL ;  Surgeon: Unk Corinn Skiff, MD;  Location: [facility] ENDOSCOPY;  Service: Gastroenterology;  Laterality: N/A;   COLONOSCOPY WITH PROPOFOL  N/A 08/02/2022   Procedure: COLONOSCOPY WITH PROPOFOL ;  Surgeon: Unk Corinn Skiff, MD;  Location: [facility] ENDOSCOPY;  Service: Gastroenterology;  Laterality: N/AMERL Lung (Caregiver & Transporter) [phone] [name] [phone] will be available for phone consent   ESOPHAGOGASTRODUODENOSCOPY (EGD) WITH PROPOFOL  N/A 07/06/2021   Procedure: ESOPHAGOGASTRODUODENOSCOPY (EGD) WITH PROPOFOL ;  Surgeon: Unk Corinn Skiff, MD;  Location: [facility] ENDOSCOPY;  Service: Gastroenterology;  Laterality: N/A;   GIVENS CAPSULE STUDY N/A 07/06/2021   Procedure: GIVENS CAPSULE STUDY;  Surgeon: Unk Corinn Skiff, MD;  Location: [facility] ENDOSCOPY;  Service: Gastroenterology;  Laterality: N/A;   IR IMAGING GUIDED PORT INSERTION  01/19/2023   IR REMOVAL TUN ACCESS W/ PORT W/O FL MOD SED  02/15/2022   PORTACATH PLACEMENT Right 07/22/2021   Procedure: INSERTION PORT-A-CATH;  Surgeon: Jordis Laneta FALCON, MD;  Location: [facility] ORS;  Service: General;  Laterality: Right;    Social History   Socioeconomic History   Marital status: Single    Spouse name: Not on file   Number of children: Not on file   Years of education: Not on file   Highest education level: Not on file  Occupational History   Not on file  Tobacco Use   Smoking status: Every Day    Current packs/day: 0.25    Types: Cigarettes  Smokeless tobacco: Current   Tobacco comments:    4 cigarettes a day - khj 09/28/2022  Vaping Use   Vaping status: Every Day   Devices: elfbar  Substance and Sexual Activity   Alcohol use: Not Currently   Drug use: Not Currently   Sexual activity: Not Currently  Other Topics Concern   Not on file  Social History Narrative   Not on file   Social Drivers  of Health   Financial Resource Strain: Not on file  Food Insecurity: Not on file  Transportation Needs: Not on file  Physical Activity: Not on file  Stress: Not on file  Social Connections: Not on file  Intimate Partner Violence: Not on file    Family History  Problem Relation Age of Onset   Kidney cancer Father    Breast cancer Maternal Aunt      Current Outpatient Medications:    acetaminophen  (TYLENOL ) 325 MG tablet, Take 2 tablets (650 mg total) by mouth every 6 (six) hours as needed for mild pain or moderate pain., Disp: 30 tablet, Rfl: 0   ARIPiprazole  ER (ABILIFY  MAINTENA) 400 MG PRSY prefilled syringe, Inject 400 mg into the muscle every 30 (thirty) days., Disp: , Rfl:    benztropine  (COGENTIN ) 2 MG tablet, Take 2 mg by mouth at bedtime., Disp: , Rfl:    busPIRone  (BUSPAR ) 10 MG tablet, Take 10 mg by mouth 2 (two) times daily., Disp: , Rfl:    FLUoxetine  (PROZAC ) 20 MG capsule, Take 20 mg by mouth daily., Disp: , Rfl:    ibuprofen  (ADVIL ) 600 MG tablet, Take 600 mg by mouth every 6 (six) hours as needed for moderate pain., Disp: , Rfl:    ibuprofen  (ADVIL ) 800 MG tablet, Take 1 tablet (800 mg total) by mouth every 8 (eight) hours as needed., Disp: 30 tablet, Rfl: 0   lidocaine -prilocaine  (EMLA ) cream, Apply 1 Application topically See admin instructions. Apply to affected area once as directed 1 hour before the pt. Comes to get chemo, Disp: 30 g, Rfl: 2   Melatonin 10 MG CAPS, Take 10 mg by mouth at bedtime., Disp: , Rfl:    ondansetron  (ZOFRAN ) 8 MG tablet, Take 8 mg by mouth 2 (two) times daily as needed for nausea or vomiting., Disp: , Rfl:    pantoprazole  (PROTONIX ) 20 MG tablet, TAKE 1 TABLET BY MOUTH ONCE DAILY *DO NOT CRUSH OR CHEW*, Disp: 24 tablet, Rfl: 10   polyethylene glycol (MIRALAX  / GLYCOLAX ) 17 g packet, Take 17 g by mouth daily as needed (constipation)., Disp: 14 each, Rfl: 0   prazosin  (MINIPRESS ) 2 MG capsule, Take 2 mg by mouth 2 (two) times daily., Disp: ,  Rfl:    prochlorperazine  (COMPAZINE ) 10 MG tablet, Take 10 mg by mouth every 6 (six) hours as needed for nausea or vomiting., Disp: , Rfl:    ARIPiprazole  (ABILIFY ) 10 MG tablet, Take 10 mg by mouth in the morning. (Patient not taking: Reported on 11/26/2023), Disp: , Rfl:    oxyCODONE  (OXY IR/ROXICODONE ) 5 MG immediate release tablet, Take 5 mg by mouth every 6 (six) hours as needed for severe pain or breakthrough pain. (Patient not taking: Reported on 11/26/2023), Disp: , Rfl:   Physical exam:  Vitals:   11/26/23 1304  BP: (!) 134/104  Pulse: 85  Resp: 17  Temp: (!) 96.6 F (35.9 C)  TempSrc: Tympanic  SpO2: 100%  Weight: 205 lb 1.6 oz (93 kg)  Height: 6' (1.829 m)   Physical Exam Cardiovascular:  Rate and Rhythm: Normal rate and regular rhythm.     Heart sounds: Normal heart sounds.  Pulmonary:     Effort: Pulmonary effort is normal.     Breath sounds: Normal breath sounds.  Abdominal:     General: Bowel sounds are normal.     Palpations: Abdomen is soft.  Skin:    General: Skin is warm and dry.  Neurological:     Mental Status: He is alert and oriented to person, place, and time.      I have personally reviewed labs listed below:    Latest Ref Rng & Units 09/26/2023   10:45 AM  CMP  Glucose 70 - 99 mg/dL 894   BUN 6 - 20 mg/dL 16   Creatinine 9.38 - 1.24 mg/dL 9.06   Sodium 864 - 854 mmol/L 136   Potassium 3.5 - 5.1 mmol/L 4.2   Chloride 98 - 111 mmol/L 105   CO2 22 - 32 mmol/L 22   Calcium  8.9 - 10.3 mg/dL 9.0   Total Protein 6.5 - 8.1 g/dL 6.9   Total Bilirubin 0.0 - 1.2 mg/dL 0.8   Alkaline Phos 38 - 126 U/L 61   AST 15 - 41 U/L 23   ALT 0 - 44 U/L 13       Latest Ref Rng & Units 11/26/2023   12:37 PM  CBC  WBC 4.0 - 10.5 K/uL 5.8   Hemoglobin 13.0 - 17.0 g/dL 86.2   Hematocrit 60.9 - 52.0 % 41.5   Platelets 150 - 400 K/uL 241    I have personally reviewed Radiology images listed below: No images are attached to the encounter.  CT CHEST ABDOMEN  PELVIS W CONTRAST Result Date: 11/18/2023 CLINICAL DATA:  Restaging colon cancer.  * Tracking Code: BO * EXAM: CT CHEST, ABDOMEN, AND PELVIS WITH CONTRAST TECHNIQUE: Multidetector CT imaging of the chest, abdomen and pelvis was performed following the standard protocol during bolus administration of intravenous contrast. RADIATION DOSE REDUCTION: This exam was performed according to the departmental dose-optimization program which includes automated exposure control, adjustment of the mA and/or kV according to patient size and/or use of iterative reconstruction technique. CONTRAST:  OMNIPAQUE  IOHEXOL  300 MG/ML  SOLN COMPARISON:  Multiple priors including CT August 01, 2023 FINDINGS: CT CHEST FINDINGS Cardiovascular: Right chest Port-A-Cath with tip near the superior cavoatrial junction. Normal size heart. No significant pericardial effusion/thickening. Mediastinum/Nodes: No suspicious thyroid nodule. No pathologically enlarged mediastinal, hilar or axillary lymph nodes. Patulous esophagus. Lungs/Pleura: Dominant pulmonary nodule in the left lower lobe measures 2.0 x 1.9 cm on image 100/4 previously 2.1 x 1.6 cm. -additional pulmonary nodule in the medial left lower lobe measures 15 x 11 mm on image 99/4 previously 11 x 7 mm. New irregular nodular consolidation in the left lower lobe measures 2 cm on image 99/4. New irregular nodular consolidation in the lingula measuring 2.2 cm on image 91/4. New linear banded consolidation in the left lower lobe on image 103/4. Musculoskeletal: No aggressive lytic or blastic lesion of bone. CT ABDOMEN PELVIS FINDINGS Hepatobiliary: No suspicious hepatic lesion. Gallbladder is unremarkable. No biliary ductal dilation. Pancreas: No pancreatic ductal dilation or evidence of acute inflammation. Spleen: No splenomegaly. Adrenals/Urinary Tract: No suspicious adrenal nodule/mass. No hydronephrosis. Kidneys demonstrate symmetric enhancement. Urinary bladder is unremarkable for degree  of distension. Stomach/Bowel: Radiopaque enteric contrast material traverses the rectum. Stomach is minimally distended limiting evaluation. No pathologic dilation of small or large bowel. Colonic stool burden compatible with constipation. Vascular/Lymphatic:  Normal caliber abdominal aorta. Similar size of the retroperitoneal lymph nodes some of which demonstrate internal calcifications for instance: -A left periaortic lymph node measures 3.2 x 2.0 cm on image 77/2 previously 3.0 x 2.0 cm. -Aortocaval lymph node measures 11 mm in short axis on image 75/2. Reproductive: Prostate is unremarkable. Other: No significant abdominopelvic free fluid. Musculoskeletal: No aggressive lytic or blastic lesion of bone. IMPRESSION: 1. New irregular nodular consolidation in the left lower lobe and lingula with a new linear banded consolidation in the left lower lobe, nonspecific but favored to reflect an infectious or inflammatory process including post treatment change. 2. Similar size of the dominant left lower lobe pulmonary nodule with slight interval increase in size of the additional left lower lobe pulmonary nodule. 3. Similar size of the retroperitoneal lymph nodes some of which demonstrate internal calcifications. 4. No evidence of new or progressive disease in the abdomen or pelvis. 5. Colonic stool burden compatible with constipation. 6.  Aortic Atherosclerosis (ICD10-I70.0). Electronically Signed   By: Reyes Holder M.D.   On: 11/18/2023 09:44     Assessment and plan- Patient is a 38 y.o. male here for a routine surveillance visit for metastatic colon cancer  Patient has been off treatment since April 2025.  I have reviewed CT chest Abdomen and pelvis images independently and discussed findings with the patient which shows stable left lower lobe lung nodule as well as retroperitoneal adenopathy.  There was a second left lower lobe lung nodule which is mildly increased in size from 11 x 7 mm presently to 15 x 11  mm.  I plan is to continue to observe this without any active treatment at this time.  I will plan to repeat his CT scan in 3 months.  If there is continued evidence of growth of the second left lower lobe lung nodule I will plan to refer him to radiation oncology for SBRT for the same.  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  Labs and scans in 3 months and see me thereafter   Visit Diagnosis 1. Encounter for follow-up surveillance of colon cancer   2. Adenocarcinoma of colon [facility])      Dr. Annah Skene, MD, MPH [facility] [phone] 11/26/2023 1:06 PM

## 2023-11-27 LAB — CEA: CEA: 5 ng/mL — ABNORMAL HIGH (ref 0.0–4.7)

## 2023-12-19 ENCOUNTER — Encounter: Payer: Self-pay | Admitting: Oncology

## 2023-12-26 ENCOUNTER — Ambulatory Visit: Payer: MEDICAID | Admitting: Family

## 2024-01-22 ENCOUNTER — Ambulatory Visit: Payer: MEDICAID | Admitting: Family

## 2024-01-24 ENCOUNTER — Encounter: Payer: Self-pay | Admitting: Cardiology

## 2024-01-24 ENCOUNTER — Ambulatory Visit (INDEPENDENT_AMBULATORY_CARE_PROVIDER_SITE_OTHER): Payer: MEDICAID | Admitting: Cardiology

## 2024-01-24 VITALS — BP 126/74 | HR 95 | Ht 72.0 in | Wt 202.0 lb

## 2024-01-24 DIAGNOSIS — E782 Mixed hyperlipidemia: Secondary | ICD-10-CM | POA: Insufficient documentation

## 2024-01-24 DIAGNOSIS — Z7689 Persons encountering health services in other specified circumstances: Secondary | ICD-10-CM | POA: Insufficient documentation

## 2024-01-24 DIAGNOSIS — Z0130 Encounter for examination of blood pressure without abnormal findings: Secondary | ICD-10-CM

## 2024-01-24 NOTE — Progress Notes (Signed)
 New Patient Office Visit  Subjective   Patient ID: Connor Wilkinson, male    DOB: 06-23-1985  Age: 38 y.o. MRN: [id number]  CC:  Chief Complaint  Patient presents with   New Patient (Initial Visit)    New Patient     HPI Connor Wilkinson presents to establish care Previous Primary Care provider/office:   he does not have additional concerns to discuss today.   Patient in office to establish care. Patient doing well, no complaints today. Patient reports doing his last chemo treatment this week for colon cancer. Follows with psychiatry for his mental health medications.  Will continue same medications.     Outpatient Encounter Medications as of 01/24/2024  Medication Sig   acetaminophen  (TYLENOL ) 325 MG tablet Take 2 tablets (650 mg total) by mouth every 6 (six) hours as needed for mild pain or moderate pain.   ARIPiprazole  ER (ABILIFY  MAINTENA) 400 MG PRSY prefilled syringe Inject 400 mg into the muscle every 30 (thirty) days.   benztropine  (COGENTIN ) 2 MG tablet Take 2 mg by mouth at bedtime.   busPIRone  (BUSPAR ) 10 MG tablet Take 10 mg by mouth 2 (two) times daily.   FLUoxetine  (PROZAC ) 20 MG capsule Take 20 mg by mouth daily.   prazosin  (MINIPRESS ) 2 MG capsule Take 2 mg by mouth 2 (two) times daily.   ibuprofen  (ADVIL ) 600 MG tablet Take 600 mg by mouth every 6 (six) hours as needed for moderate pain.   ibuprofen  (ADVIL ) 800 MG tablet Take 1 tablet (800 mg total) by mouth every 8 (eight) hours as needed.   lidocaine -prilocaine  (EMLA ) cream Apply 1 Application topically See admin instructions. Apply to affected area once as directed 1 hour before the pt. Comes to get chemo   Melatonin 10 MG CAPS Take 10 mg by mouth at bedtime.   ondansetron  (ZOFRAN ) 8 MG tablet Take 8 mg by mouth 2 (two) times daily as needed for nausea or vomiting.   pantoprazole  (PROTONIX ) 20 MG tablet TAKE 1 TABLET BY MOUTH ONCE DAILY *DO NOT CRUSH OR CHEW*   polyethylene glycol (MIRALAX  / GLYCOLAX ) 17 g  packet Take 17 g by mouth daily as needed (constipation).   prochlorperazine  (COMPAZINE ) 10 MG tablet Take 10 mg by mouth every 6 (six) hours as needed for nausea or vomiting.   [DISCONTINUED] ARIPiprazole  (ABILIFY ) 10 MG tablet Take 10 mg by mouth in the morning. (Patient not taking: Reported on 11/26/2023)   [DISCONTINUED] oxyCODONE  (OXY IR/ROXICODONE ) 5 MG immediate release tablet Take 5 mg by mouth every 6 (six) hours as needed for severe pain or breakthrough pain. (Patient not taking: Reported on 01/24/2024)   No facility-administered encounter medications on file as of 01/24/2024.    Past Medical History:  Diagnosis Date   Bipolar 1 disorder (HCC)    Colon cancer (HCC)    Lung nodule 09/2022   PTSD (post-traumatic stress disorder)     Past Surgical History:  Procedure Laterality Date   BRONCHIAL NEEDLE ASPIRATION BIOPSY  10/12/2022   Procedure: BRONCHIAL NEEDLE ASPIRATION BIOPSIES;  Surgeon: Isadora Hose, MD;  Location: [facility] ENDOSCOPY;  Service: Pulmonary;;   COLON RESECTION N/A 07/12/2021   Procedure: HAND ASSISTED LAPAROSCOPIC COLON RESECTION;  Surgeon: Jordis Laneta FALCON, MD;  Location: [facility] ORS;  Service: General;  Laterality: N/A;   COLONOSCOPY WITH PROPOFOL  N/A 07/06/2021   Procedure: COLONOSCOPY WITH PROPOFOL ;  Surgeon: Unk Corinn Skiff, MD;  Location: [facility] ENDOSCOPY;  Service: Gastroenterology;  Laterality: N/A;   COLONOSCOPY WITH PROPOFOL  N/A 07/07/2021  Procedure: COLONOSCOPY WITH PROPOFOL ;  Surgeon: Unk Corinn Skiff, MD;  Location: [facility] ENDOSCOPY;  Service: Gastroenterology;  Laterality: N/A;   COLONOSCOPY WITH PROPOFOL  N/A 08/02/2022   Procedure: COLONOSCOPY WITH PROPOFOL ;  Surgeon: Unk Corinn Skiff, MD;  Location: [facility] ENDOSCOPY;  Service: Gastroenterology;  Laterality: N/AMERL Lung (Caregiver & Transporter) [phone] [name] [phone] will be available for phone consent   ESOPHAGOGASTRODUODENOSCOPY (EGD) WITH PROPOFOL  N/A 07/06/2021   Procedure:  ESOPHAGOGASTRODUODENOSCOPY (EGD) WITH PROPOFOL ;  Surgeon: Unk Corinn Skiff, MD;  Location: [facility] ENDOSCOPY;  Service: Gastroenterology;  Laterality: N/A;   GIVENS CAPSULE STUDY N/A 07/06/2021   Procedure: GIVENS CAPSULE STUDY;  Surgeon: Unk Corinn Skiff, MD;  Location: [facility] ENDOSCOPY;  Service: Gastroenterology;  Laterality: N/A;   IR IMAGING GUIDED PORT INSERTION  01/19/2023   IR REMOVAL TUN ACCESS W/ PORT W/O FL MOD SED  02/15/2022   PORTACATH PLACEMENT Right 07/22/2021   Procedure: INSERTION PORT-A-CATH;  Surgeon: Jordis Laneta FALCON, MD;  Location: [facility] ORS;  Service: General;  Laterality: Right;    Family History  Problem Relation Age of Onset   Kidney cancer Father    Breast cancer Maternal Aunt     Social History   Socioeconomic History   Marital status: Single    Spouse name: Not on file   Number of children: Not on file   Years of education: Not on file   Highest education level: Not on file  Occupational History   Not on file  Tobacco Use   Smoking status: Every Day    Current packs/day: 0.25    Types: Cigarettes   Smokeless tobacco: Current   Tobacco comments:    4 cigarettes a day - khj 09/28/2022  Vaping Use   Vaping status: Every Day   Devices: elfbar  Substance and Sexual Activity   Alcohol use: Not Currently   Drug use: Not Currently   Sexual activity: Not Currently  Other Topics Concern   Not on file  Social History Narrative   Not on file   Social Drivers of Health   Financial Resource Strain: Not on file  Food Insecurity: Not on file  Transportation Needs: Not on file  Physical Activity: Not on file  Stress: Not on file  Social Connections: Not on file  Intimate Partner Violence: Not on file    Review of Systems  Constitutional: Negative.   HENT: Negative.    Eyes: Negative.   Respiratory: Negative.  Negative for shortness of breath.   Cardiovascular: Negative.  Negative for chest pain.  Gastrointestinal: Negative.  Negative for abdominal  pain, constipation and diarrhea.  Genitourinary: Negative.   Musculoskeletal:  Negative for joint pain and myalgias.  Skin: Negative.   Neurological: Negative.  Negative for dizziness and headaches.  Endo/Heme/Allergies: Negative.   All other systems reviewed and are negative.       Objective   BP 126/74   Pulse 95   Ht 6' (1.829 m)   Wt 202 lb (91.6 kg)   SpO2 97%   BMI 27.40 kg/m   Physical Exam Nursing note reviewed.  Constitutional:      Appearance: Normal appearance. He is normal weight.  HENT:     Head: Normocephalic and atraumatic.     Nose: Nose normal.     Mouth/Throat:     Mouth: Mucous membranes are moist.     Pharynx: Oropharynx is clear.  Eyes:     Extraocular Movements: Extraocular movements intact.     Conjunctiva/sclera: Conjunctivae normal.  Pupils: Pupils are equal, round, and reactive to light.  Cardiovascular:     Rate and Rhythm: Normal rate and regular rhythm.     Pulses: Normal pulses.     Heart sounds: Normal heart sounds.  Pulmonary:     Effort: Pulmonary effort is normal.     Breath sounds: Normal breath sounds.  Abdominal:     General: Abdomen is flat. Bowel sounds are normal.     Palpations: Abdomen is soft.  Musculoskeletal:        General: Normal range of motion.     Cervical back: Normal range of motion.  Skin:    General: Skin is warm and dry.  Neurological:     General: No focal deficit present.     Mental Status: He is alert and oriented to person, place, and time.  Psychiatric:        Mood and Affect: Mood normal.        Behavior: Behavior normal.        Thought Content: Thought content normal.        Judgment: Judgment normal.        Assessment & Plan:  Continue same medications.   Problem List Items Addressed This Visit       Other   Encounter to establish care - Primary   Mixed hyperlipidemia    Return in about 3 months (around 04/25/2024) for fasting lab work prior.   Total time spent: 25  minutes  [employer], NP  01/24/2024   This document may have been prepared by Dragon Voice Recognition software and as such may include unintentional dictation errors.

## 2024-02-01 ENCOUNTER — Other Ambulatory Visit: Payer: Self-pay

## 2024-02-01 MED ORDER — PANTOPRAZOLE SODIUM 20 MG PO TBEC: 20.0000 mg | DELAYED_RELEASE_TABLET | Freq: Every day | ORAL | 3 refills | Status: AC

## 2024-02-12 ENCOUNTER — Ambulatory Visit
Admission: RE | Admit: 2024-02-12 | Discharge: 2024-02-12 | Disposition: A | Discharge status: Home / Self Care | Payer: MEDICAID | Source: Ambulatory Visit | Attending: Oncology | Admitting: Oncology

## 2024-02-12 DIAGNOSIS — C189 Malignant neoplasm of colon, unspecified: Secondary | ICD-10-CM | POA: Insufficient documentation

## 2024-02-12 MED ORDER — IOHEXOL 300 MG/ML  SOLN
100.0000 mL | Freq: Once | INTRAMUSCULAR | Status: AC | PRN
  Administered 2024-02-12: 100 mL via INTRAVENOUS

## 2024-02-15 ENCOUNTER — Encounter: Payer: Self-pay | Admitting: Oncology

## 2024-02-26 ENCOUNTER — Inpatient Hospital Stay: Payer: MEDICAID | Attending: Oncology

## 2024-02-26 ENCOUNTER — Inpatient Hospital Stay (HOSPITAL_BASED_OUTPATIENT_CLINIC_OR_DEPARTMENT_OTHER): Payer: MEDICAID | Admitting: Oncology

## 2024-02-26 ENCOUNTER — Encounter: Payer: Self-pay | Admitting: Oncology

## 2024-02-26 DIAGNOSIS — Z923 Personal history of irradiation: Secondary | ICD-10-CM | POA: Diagnosis not present

## 2024-02-26 DIAGNOSIS — Z8051 Family history of malignant neoplasm of kidney: Secondary | ICD-10-CM | POA: Diagnosis not present

## 2024-02-26 DIAGNOSIS — C7800 Secondary malignant neoplasm of unspecified lung: Secondary | ICD-10-CM | POA: Insufficient documentation

## 2024-02-26 DIAGNOSIS — C772 Secondary and unspecified malignant neoplasm of intra-abdominal lymph nodes: Secondary | ICD-10-CM | POA: Insufficient documentation

## 2024-02-26 DIAGNOSIS — F17210 Nicotine dependence, cigarettes, uncomplicated: Secondary | ICD-10-CM | POA: Diagnosis not present

## 2024-02-26 DIAGNOSIS — Z803 Family history of malignant neoplasm of breast: Secondary | ICD-10-CM | POA: Insufficient documentation

## 2024-02-26 DIAGNOSIS — R918 Other nonspecific abnormal finding of lung field: Secondary | ICD-10-CM | POA: Diagnosis not present

## 2024-02-26 DIAGNOSIS — Z9221 Personal history of antineoplastic chemotherapy: Secondary | ICD-10-CM | POA: Insufficient documentation

## 2024-02-26 DIAGNOSIS — C189 Malignant neoplasm of colon, unspecified: Secondary | ICD-10-CM | POA: Insufficient documentation

## 2024-02-26 DIAGNOSIS — Z08 Encounter for follow-up examination after completed treatment for malignant neoplasm: Secondary | ICD-10-CM

## 2024-02-26 LAB — CBC WITH DIFFERENTIAL (CANCER CENTER ONLY)
Abs Immature Granulocytes: 0.01 K/uL (ref 0.00–0.07)
Basophils Absolute: 0 K/uL (ref 0.0–0.1)
Basophils Relative: 1 %
Eosinophils Absolute: 0.2 K/uL (ref 0.0–0.5)
Eosinophils Relative: 4 %
HCT: 42.5 % (ref 39.0–52.0)
Hemoglobin: 13.8 g/dL (ref 13.0–17.0)
Immature Granulocytes: 0 %
Lymphocytes Relative: 26 %
Lymphs Abs: 1.5 K/uL (ref 0.7–4.0)
MCH: 29.4 pg (ref 26.0–34.0)
MCHC: 32.5 g/dL (ref 30.0–36.0)
MCV: 90.6 fL (ref 80.0–100.0)
Monocytes Absolute: 0.3 K/uL (ref 0.1–1.0)
Monocytes Relative: 5 %
Neutro Abs: 3.7 K/uL (ref 1.7–7.7)
Neutrophils Relative %: 64 %
Platelet Count: 227 K/uL (ref 150–400)
RBC: 4.69 MIL/uL (ref 4.22–5.81)
RDW: 13.9 % (ref 11.5–15.5)
WBC Count: 5.8 K/uL (ref 4.0–10.5)
nRBC: 0 % (ref 0.0–0.2)

## 2024-02-26 LAB — CMP (CANCER CENTER ONLY)
ALT: 14 U/L (ref 0–44)
AST: 23 U/L (ref 15–41)
Albumin: 4.3 g/dL (ref 3.5–5.0)
Alkaline Phosphatase: 65 U/L (ref 38–126)
Anion gap: 9 (ref 5–15)
BUN: 14 mg/dL (ref 6–20)
CO2: 23 mmol/L (ref 22–32)
Calcium: 9.3 mg/dL (ref 8.9–10.3)
Chloride: 104 mmol/L (ref 98–111)
Creatinine: 0.93 mg/dL (ref 0.61–1.24)
GFR, Estimated: >60 mL/min (ref >60)
Glucose, Bld: 101 mg/dL — ABNORMAL HIGH (ref 70–99)
Potassium: 4.2 mmol/L (ref 3.5–5.1)
Sodium: 136 mmol/L (ref 135–145)
Total Bilirubin: 0.5 mg/dL (ref 0.0–1.2)
Total Protein: 7.4 g/dL (ref 6.5–8.1)

## 2024-02-26 NOTE — Progress Notes (Signed)
 Patient doing well; no new or acute concerns at this time.

## 2024-02-27 LAB — CEA: CEA: 6.6 ng/mL — ABNORMAL HIGH (ref 0.0–4.7)

## 2024-03-01 ENCOUNTER — Encounter: Payer: Self-pay | Admitting: Oncology

## 2024-03-01 NOTE — Progress Notes (Signed)
 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  He was found to have a hemoglobin of 4.5/18.7 on admission.  He received blood transfusion as well as IV iron  and his hemoglobin is presently up to an 8.  He underwent colonoscopy which showed malignant partially obstructing tumor that was circumferential 80 cm proximal to the anus.  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  CT chest abdomen and pelvis with contrast did not show any evidence of distant metastatic disease.  2 small sclerotic densities in the acetabulum and proximal right femur likely benign process.  Pathologically enlarged lymph nodes superior to circumferential colonic lesion suggesting metastatic adenopathy.   Patient underwent right hemicolectomy with Dr. Jordis on 07/12/2021.  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  Vermiform appendix negative for tumor.  Radial margin positive  for tumor (2 lymph nodes at margin with adenocarcinoma).  Proximal and distal margins negative.  7 out of 56 lymph nodes positive for metastatic adenocarcinoma.  Tumor deposits not applicable.  No lymphovascular or perineural invasion seen.  PT4APN2B.  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  BRAF testing was negative.  Genetic testing was offered to the patient but he declined   Fertility conservation was also discussed an option of sperm banking at [facility] was discussed with the patient and his healthcare power of attorney Connor Wilkinson.  There would be a one-time cost involved for sperm banking and a yearly cost for preserving the specimen.  Overall chances of infertility with FOLFOX is low but possible.  Patient and his healthcare power of attorney have discussed their options and have decided not to proceed with fertility conservation at this time   Patient completed 12 cycles of adjuvant FOLFOX chemotherapy in followed by adjuvant radiation therapy in September 2023    CT scan and PET scan showed concern for metastatic disease.  18 x 16 mm left lower lobe lung nodule with an SUV of 2.4.  Progressive abdominal/retroperitoneal adenopathy with lymph nodes measuring between 6 mm to 18 mm which were PET avid.  Lung biopsy was negative for malignancy.  Retroperitoneal adenopathy was biopsied and was consistent with mucinous adenocarcinoma of colon primary.  Tissue Tempus testing ATM, BRCA2, PIK3CA mutation.  MLH1 mutation. No evidence of K-ras BRAF or NRAS mutation.  Tumor mutational burden high 95th percentile MSI status could not be assessed because the tumor content is below 30%   Patient received 12 cycles of palliative FOLFIRI Avastin  chemotherapy ending in April 2025.  He also received radiation treatment/SBRT to the dominant left lower  lobe lung nodules.  Patient has been on treatment break since April 2025.  Interval history- He feels  well today. Denies any complaints at this time. Appetite and weight have remained stable  ECOG PS- 0 Pain scale- 0   Review of systems- Review of Systems  Constitutional:  Negative for chills, fever, malaise/fatigue and weight loss.  HENT:  Negative for congestion, ear discharge and nosebleeds.   Eyes:  Negative for blurred vision.  Respiratory:  Negative for cough, hemoptysis, sputum production, shortness of breath and wheezing.   Cardiovascular:  Negative for chest pain, palpitations, orthopnea and claudication.  Gastrointestinal:  Negative for abdominal pain, blood in stool, constipation, diarrhea, heartburn, melena, nausea and vomiting.  Genitourinary:  Negative for dysuria, flank pain, frequency, hematuria and urgency.  Musculoskeletal:  Negative for back pain, joint pain and myalgias.  Skin:  Negative for rash.  Neurological:  Negative for dizziness, tingling, focal weakness, seizures, weakness and headaches.  Endo/Heme/Allergies:  Does not bruise/bleed easily.  Psychiatric/Behavioral:  Negative for depression and suicidal ideas. The patient does not have insomnia.       Allergies  Allergen Reactions   Geodon [Ziprasidone Hcl] Anaphylaxis     Past Medical History:  Diagnosis Date   Bipolar 1 disorder (HCC)    Colon cancer (HCC)    Lung nodule 09/2022   PTSD (post-traumatic stress disorder)      Past Surgical History:  Procedure Laterality Date   BRONCHIAL NEEDLE ASPIRATION BIOPSY  10/12/2022   Procedure: BRONCHIAL NEEDLE ASPIRATION BIOPSIES;  Surgeon: Isadora Hose, MD;  Location: [facility] ENDOSCOPY;  Service: Pulmonary;;   COLON RESECTION N/A 07/12/2021   Procedure: HAND ASSISTED LAPAROSCOPIC COLON RESECTION;  Surgeon: Jordis Laneta FALCON, MD;  Location: [facility] ORS;  Service: General;  Laterality: N/A;   COLONOSCOPY WITH PROPOFOL  N/A 07/06/2021   Procedure: COLONOSCOPY WITH PROPOFOL ;  Surgeon: Unk Corinn Skiff, MD;  Location: [facility] ENDOSCOPY;  Service: Gastroenterology;   Laterality: N/A;   COLONOSCOPY WITH PROPOFOL  N/A 07/07/2021   Procedure: COLONOSCOPY WITH PROPOFOL ;  Surgeon: Unk Corinn Skiff, MD;  Location: [facility] ENDOSCOPY;  Service: Gastroenterology;  Laterality: N/A;   COLONOSCOPY WITH PROPOFOL  N/A 08/02/2022   Procedure: COLONOSCOPY WITH PROPOFOL ;  Surgeon: Unk Corinn Skiff, MD;  Location: [facility] ENDOSCOPY;  Service: Gastroenterology;  Laterality: N/AMERL Lung (Caregiver & Transporter) [phone] [name] [phone] will be available for phone consent   ESOPHAGOGASTRODUODENOSCOPY (EGD) WITH PROPOFOL  N/A 07/06/2021   Procedure: ESOPHAGOGASTRODUODENOSCOPY (EGD) WITH PROPOFOL ;  Surgeon: Unk Corinn Skiff, MD;  Location: [facility] ENDOSCOPY;  Service: Gastroenterology;  Laterality: N/A;   GIVENS CAPSULE STUDY N/A 07/06/2021   Procedure: GIVENS CAPSULE STUDY;  Surgeon: Unk Corinn Skiff, MD;  Location: [facility] ENDOSCOPY;  Service: Gastroenterology;  Laterality: N/A;   IR IMAGING GUIDED PORT INSERTION  01/19/2023   IR REMOVAL TUN ACCESS W/ PORT W/O FL MOD SED  02/15/2022   PORTACATH PLACEMENT Right 07/22/2021   Procedure: INSERTION PORT-A-CATH;  Surgeon: Jordis Laneta FALCON, MD;  Location: [facility] ORS;  Service: General;  Laterality: Right;    Social History   Socioeconomic History   Marital status: Single    Spouse name: Not on file   Number of children: Not on file   Years of education: Not on file   Highest education level: Not on file  Occupational History   Not on file  Tobacco Use   Smoking status: Every Day    Current packs/day: 0.25    Types: Cigarettes   Smokeless tobacco: Current   Tobacco comments:  4 cigarettes a day - khj 09/28/2022  Vaping Use   Vaping status: Every Day   Devices: elfbar  Substance and Sexual Activity   Alcohol use: Not Currently   Drug use: Not Currently   Sexual activity: Not Currently  Other Topics Concern   Not on file  Social History Narrative   Not on file   Social Drivers of Health   Financial  Resource Strain: Not on file  Food Insecurity: No Food Insecurity (02/26/2024)   Hunger Vital Sign    Worried About Running Out of Food in the Last Year: Never true    Ran Out of Food in the Last Year: Never true  Transportation Needs: No Transportation Needs (02/26/2024)   PRAPARE - Administrator, Civil Service (Medical): No    Lack of Transportation (Non-Medical): No  Physical Activity: Not on file  Stress: Not on file  Social Connections: Not on file  Intimate Partner Violence: Not At Risk (02/26/2024)   Humiliation, Afraid, Rape, and Kick questionnaire    Fear of Current or Ex-Partner: No    Emotionally Abused: No    Physically Abused: No    Sexually Abused: No    Family History  Problem Relation Age of Onset   Kidney cancer Father    Breast cancer Maternal Aunt      Current Outpatient Medications:    acetaminophen  (TYLENOL ) 325 MG tablet, Take 2 tablets (650 mg total) by mouth every 6 (six) hours as needed for mild pain or moderate pain., Disp: 30 tablet, Rfl: 0   ARIPiprazole  ER (ABILIFY  MAINTENA) 400 MG PRSY prefilled syringe, Inject 400 mg into the muscle every 30 (thirty) days., Disp: , Rfl:    benztropine  (COGENTIN ) 2 MG tablet, Take 2 mg by mouth at bedtime., Disp: , Rfl:    busPIRone  (BUSPAR ) 10 MG tablet, Take 10 mg by mouth 2 (two) times daily., Disp: , Rfl:    FLUoxetine  (PROZAC ) 20 MG capsule, Take 20 mg by mouth daily., Disp: , Rfl:    ibuprofen  (ADVIL ) 600 MG tablet, Take 600 mg by mouth every 6 (six) hours as needed for moderate pain., Disp: , Rfl:    ibuprofen  (ADVIL ) 800 MG tablet, Take 1 tablet (800 mg total) by mouth every 8 (eight) hours as needed., Disp: 30 tablet, Rfl: 0   lidocaine -prilocaine  (EMLA ) cream, Apply 1 Application topically See admin instructions. Apply to affected area once as directed 1 hour before the pt. Comes to get chemo, Disp: 30 g, Rfl: 2   Melatonin 10 MG CAPS, Take 10 mg by mouth at bedtime., Disp: , Rfl:    ondansetron   (ZOFRAN ) 8 MG tablet, Take 8 mg by mouth 2 (two) times daily as needed for nausea or vomiting., Disp: , Rfl:    pantoprazole  (PROTONIX ) 20 MG tablet, Take 1 tablet (20 mg total) by mouth daily., Disp: 30 tablet, Rfl: 3   polyethylene glycol (MIRALAX  / GLYCOLAX ) 17 g packet, Take 17 g by mouth daily as needed (constipation)., Disp: 14 each, Rfl: 0   prazosin  (MINIPRESS ) 2 MG capsule, Take 2 mg by mouth 2 (two) times daily., Disp: , Rfl:    prochlorperazine  (COMPAZINE ) 10 MG tablet, Take 10 mg by mouth every 6 (six) hours as needed for nausea or vomiting., Disp: , Rfl:   Physical exam:  Vitals:   02/26/24 1319 02/26/24 1323  BP: (!) 154/113 (!) 153/141  Pulse: 91   Resp: 18   Temp: (!) 95.9 F (35.5 C)  TempSrc: Tympanic   SpO2: 100%   Weight: 204 lb 3.2 oz (92.6 kg)   Height: 6' (1.829 m)    Physical Exam Cardiovascular:     Rate and Rhythm: Normal rate and regular rhythm.     Heart sounds: Normal heart sounds.  Pulmonary:     Effort: Pulmonary effort is normal.     Breath sounds: Normal breath sounds.  Abdominal:     General: Bowel sounds are normal.     Palpations: Abdomen is soft.  Musculoskeletal:     Cervical back: Normal range of motion.  Skin:    General: Skin is warm and dry.  Neurological:     Mental Status: He is alert and oriented to person, place, and time.      I have personally reviewed labs listed below:    Latest Ref Rng & Units 02/26/2024   12:56 PM  CMP  Glucose 70 - 99 mg/dL 898   BUN 6 - 20 mg/dL 14   Creatinine 9.38 - 1.24 mg/dL 9.06   Sodium 864 - 854 mmol/L 136   Potassium 3.5 - 5.1 mmol/L 4.2   Chloride 98 - 111 mmol/L 104   CO2 22 - 32 mmol/L 23   Calcium  8.9 - 10.3 mg/dL 9.3   Total Protein 6.5 - 8.1 g/dL 7.4   Total Bilirubin 0.0 - 1.2 mg/dL 0.5   Alkaline Phos 38 - 126 U/L 65   AST 15 - 41 U/L 23   ALT 0 - 44 U/L 14       Latest Ref Rng & Units 02/26/2024   12:56 PM  CBC  WBC 4.0 - 10.5 K/uL 5.8   Hemoglobin 13.0 - 17.0 g/dL  86.1   Hematocrit 60.9 - 52.0 % 42.5   Platelets 150 - 400 K/uL 227    I have personally reviewed Radiology images listed below: No images are attached to the encounter.  CT CHEST ABDOMEN PELVIS W CONTRAST Result Date: 02/14/2024 CLINICAL DATA:  Colon cancer, restaging.  * Tracking Code: BO * EXAM: CT CHEST, ABDOMEN, AND PELVIS WITH CONTRAST TECHNIQUE: Multidetector CT imaging of the chest, abdomen and pelvis was performed following the standard protocol during bolus administration of intravenous contrast. RADIATION DOSE REDUCTION: This exam was performed according to the departmental dose-optimization program which includes automated exposure control, adjustment of the mA and/or kV according to patient size and/or use of iterative reconstruction technique. CONTRAST:  OMNIPAQUE  IOHEXOL  300 MG/ML  SOLN COMPARISON:  Multiple priors including CT November 12, 2023 FINDINGS: CT CHEST FINDINGS Cardiovascular: Right chest Port-A-Cath with tip near the superior cavoatrial junction. Normal caliber thoracic aorta. Normal size heart. No significant pericardial effusion/thickening. Mediastinum/Nodes: No suspicious thyroid nodule. Wispy crescentic soft tissue in the anterior mediastinum which conforms underlying vasculature is favored thymic tissue. No pathologically enlarged mediastinal, hilar or axillary lymph nodes. Lungs/Pleura: Dominant nodule in the left lower lobe measures 2.1 x 2.1 cm on image 117/4 previously 2.0 x 1.9 cm. Additional subpleural pulmonary nodule in the medial left lower lobe measures 16 x 10 mm on image 117/4 previously 15 x 11 mm. Similar nodular consolidation in the left lower lobe with internal air bronchograms measuring 2 cm on image 114/4. Similar consolidation in the lingula on image 103/4. Musculoskeletal: No aggressive lytic or blastic lesion of bone. CT ABDOMEN PELVIS FINDINGS Hepatobiliary: No suspicious hepatic lesion. Gallbladder is unremarkable. No biliary ductal dilation.  Pancreas: No pancreatic ductal dilation or evidence of acute inflammation. Spleen: No splenomegaly. Adrenals/Urinary Tract:  No suspicious adrenal nodule/mass. No hydronephrosis. Kidneys demonstrate symmetric enhancement. Urinary bladder is unremarkable for degree of distension. Stomach/Bowel: Stomach is unremarkable for degree of distension. No pathologic dilation of small or large bowel. Prior partial right hemicolectomy with ileocolic anastomosis in the right hemiabdomen. Colonic stool burden compatible with constipation. Vascular/Lymphatic: Aortic atherosclerosis. Normal caliber abdominal aorta. Again seen are retroperitoneal lymph nodes some of which demonstrate internal calcifications. For reference: -left periaortic lymph node measures 3.3 x 2.2 cm on image 83/2 previously 3.2 x 2.0 cm. -aortocaval lymph node measures 15 mm in short axis on image 81/2 previously 12 mm. Reproductive: Prostate is unremarkable. Other: No significant abdominopelvic free fluid. Musculoskeletal: No aggressive lytic or blastic lesion of bone. IMPRESSION: 1. Slight interval increase in size of the dominant left lower lobe pulmonary nodule and retroperitoneal lymph nodes. 2. Similar nodular consolidation in the left lower lobe and lingula. 3. Prior partial right hemicolectomy with ileocolic anastomosis in the right hemiabdomen. 4. Colonic stool burden compatible with constipation. 5.  Aortic Atherosclerosis (ICD10-I70.0). Electronically Signed   By: Reyes Holder M.D.   On: 02/14/2024 06:46     Assessment and plan- Patient is a 38 y.o. 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  CT shows slow growth in lung lesions and lymph nodes but does not meet RECIST criteria for  progression yet.   - Order scan end of January to assess progression. If there is continued progression across 2 scans, I will plan to start combination ipilumumab and nivolumab given that he has MLH1 mutation - Initiate immunotherapy with nivolumab and another agent every three weeks for four treatments, then continue with single agent Nivolumab every three weeks. - Discuss immunotherapy in detail during early February visit. - Prepare for treatment initiation by February.  Management of implanted vascular access device (port) Port had access issues; not used during visit. Requires regular flushing. - Schedule port flush in eight weeks. - Evaluate port functionality and address issues during next visit.         Visit Diagnosis 1. Adenocarcinoma of colon [facility])      Dr. Annah Skene, MD, MPH [facility] at [facility] [phone] 03/01/2024 6:52 PM

## 2024-04-25 ENCOUNTER — Encounter: Payer: Self-pay | Admitting: Cardiology

## 2024-04-25 ENCOUNTER — Ambulatory Visit: Payer: MEDICAID | Admitting: Cardiology

## 2024-04-25 VITALS — BP 134/66 | HR 94 | Ht 72.0 in | Wt 204.0 lb

## 2024-04-25 DIAGNOSIS — F319 Bipolar disorder, unspecified: Secondary | ICD-10-CM | POA: Diagnosis not present

## 2024-04-25 DIAGNOSIS — Z1329 Encounter for screening for other suspected endocrine disorder: Secondary | ICD-10-CM | POA: Diagnosis not present

## 2024-04-25 DIAGNOSIS — E782 Mixed hyperlipidemia: Secondary | ICD-10-CM

## 2024-04-25 DIAGNOSIS — Z13220 Encounter for screening for lipoid disorders: Secondary | ICD-10-CM | POA: Diagnosis not present

## 2024-04-25 DIAGNOSIS — Z0130 Encounter for examination of blood pressure without abnormal findings: Secondary | ICD-10-CM

## 2024-04-25 DIAGNOSIS — Z131 Encounter for screening for diabetes mellitus: Secondary | ICD-10-CM

## 2024-04-25 DIAGNOSIS — F17210 Nicotine dependence, cigarettes, uncomplicated: Secondary | ICD-10-CM

## 2024-04-25 MED ORDER — SALINE SPRAY 0.65 % NA SOLN: 1.0000 | NASAL | 1 refills | Status: DC | PRN

## 2024-04-25 NOTE — Progress Notes (Signed)
 "  Established Patient Office Visit  Subjective:  Patient ID: Connor Wilkinson, male    DOB: Nov 20, 1985  Age: 39 y.o. MRN: [id number]  Chief Complaint  Patient presents with   Follow-up    3 months follow up    Patient in office for 3 month follow up, did not have fasting lab work done. Caregiver from group home in room for visit.  Patient doing well over all. Complains of dry nasal passages. Recommend saline nasal spray as needed.  Will do lab work today. Continue current medications.     No other concerns at this time.   Past Medical History:  Diagnosis Date   Bipolar 1 disorder (HCC)    Colon cancer (HCC)    Lung nodule 09/2022   PTSD (post-traumatic stress disorder)     Past Surgical History:  Procedure Laterality Date   BRONCHIAL NEEDLE ASPIRATION BIOPSY  10/12/2022   Procedure: BRONCHIAL NEEDLE ASPIRATION BIOPSIES;  Surgeon: Isadora Hose, MD;  Location: [facility] ENDOSCOPY;  Service: Pulmonary;;   COLON RESECTION N/A 07/12/2021   Procedure: HAND ASSISTED LAPAROSCOPIC COLON RESECTION;  Surgeon: Jordis Laneta FALCON, MD;  Location: [facility] ORS;  Service: General;  Laterality: N/A;   COLONOSCOPY WITH PROPOFOL  N/A 07/06/2021   Procedure: COLONOSCOPY WITH PROPOFOL ;  Surgeon: Unk Corinn Skiff, MD;  Location: [facility] ENDOSCOPY;  Service: Gastroenterology;  Laterality: N/A;   COLONOSCOPY WITH PROPOFOL  N/A 07/07/2021   Procedure: COLONOSCOPY WITH PROPOFOL ;  Surgeon: Unk Corinn Skiff, MD;  Location: [facility] ENDOSCOPY;  Service: Gastroenterology;  Laterality: N/A;   COLONOSCOPY WITH PROPOFOL  N/A 08/02/2022   Procedure: COLONOSCOPY WITH PROPOFOL ;  Surgeon: Unk Corinn Skiff, MD;  Location: [facility] ENDOSCOPY;  Service: Gastroenterology;  Laterality: N/AMERL Lung (Caregiver & Transporter) [phone] [name] [phone] will be available for phone consent   ESOPHAGOGASTRODUODENOSCOPY (EGD) WITH PROPOFOL  N/A 07/06/2021   Procedure: ESOPHAGOGASTRODUODENOSCOPY (EGD) WITH PROPOFOL ;  Surgeon:  Unk Corinn Skiff, MD;  Location: [facility] ENDOSCOPY;  Service: Gastroenterology;  Laterality: N/A;   GIVENS CAPSULE STUDY N/A 07/06/2021   Procedure: GIVENS CAPSULE STUDY;  Surgeon: Unk Corinn Skiff, MD;  Location: [facility] ENDOSCOPY;  Service: Gastroenterology;  Laterality: N/A;   IR IMAGING GUIDED PORT INSERTION  01/19/2023   IR REMOVAL TUN ACCESS W/ PORT W/O FL MOD SED  02/15/2022   PORTACATH PLACEMENT Right 07/22/2021   Procedure: INSERTION PORT-A-CATH;  Surgeon: Jordis Laneta FALCON, MD;  Location: [facility] ORS;  Service: General;  Laterality: Right;    Social History   Socioeconomic History   Marital status: Single    Spouse name: Not on file   Number of children: Not on file   Years of education: Not on file   Highest education level: Not on file  Occupational History   Not on file  Tobacco Use   Smoking status: Every Day    Current packs/day: 0.25    Types: Cigarettes   Smokeless tobacco: Current   Tobacco comments:    4 cigarettes a day - khj 09/28/2022  Vaping Use   Vaping status: Every Day   Devices: elfbar  Substance and Sexual Activity   Alcohol use: Not Currently   Drug use: Not Currently   Sexual activity: Not Currently  Other Topics Concern   Not on file  Social History Narrative   Not on file   Social Drivers of Health   Tobacco Use: High Risk (04/25/2024)   Patient History    Smoking Tobacco Use: Every Day    Smokeless Tobacco Use: Current  Passive Exposure: Not on file  Financial Resource Strain: Not on file  Food Insecurity: No Food Insecurity (02/26/2024)   Epic    Worried About Programme Researcher, Broadcasting/film/video in the Last Year: Never true    Ran Out of Food in the Last Year: Never true  Transportation Needs: No Transportation Needs (02/26/2024)   Epic    Lack of Transportation (Medical): No    Lack of Transportation (Non-Medical): No  Physical Activity: Not on file  Stress: Not on file  Social Connections: Not on file  Intimate Partner Violence: Not At Risk  (02/26/2024)   Epic    Fear of Current or Ex-Partner: No    Emotionally Abused: No    Physically Abused: No    Sexually Abused: No  Depression (PHQ2-9): Low Risk (02/26/2024)   Depression (PHQ2-9)    PHQ-2 Score: 0  Alcohol Screen: Not on file  Housing: Low Risk (02/26/2024)   Epic    Unable to Pay for Housing in the Last Year: No    Number of Times Moved in the Last Year: 0    Homeless in the Last Year: No  Utilities: Not At Risk (02/26/2024)   Epic    Threatened with loss of utilities: No  Health Literacy: Not on file    Family History  Problem Relation Age of Onset   Kidney cancer Father    Breast cancer Maternal Aunt     Allergies[1]  Show/hide medication list[2]  Review of Systems  Constitutional: Negative.   HENT: Negative.    Eyes: Negative.   Respiratory: Negative.  Negative for shortness of breath.   Cardiovascular: Negative.  Negative for chest pain.  Gastrointestinal: Negative.  Negative for abdominal pain, constipation and diarrhea.  Genitourinary: Negative.   Musculoskeletal:  Negative for joint pain and myalgias.  Skin: Negative.   Neurological: Negative.  Negative for dizziness and headaches.  Endo/Heme/Allergies: Negative.   All other systems reviewed and are negative.      Objective:   BP 134/66   Pulse 94   Ht 6' (1.829 m)   Wt 204 lb (92.5 kg)   SpO2 96%   BMI 27.67 kg/m   Vitals:   04/25/24 1317  BP: 134/66  Pulse: 94  Height: 6' (1.829 m)  Weight: 204 lb (92.5 kg)  SpO2: 96%  BMI (Calculated): 27.66    Physical Exam Nursing note reviewed.  Constitutional:      Appearance: Normal appearance. He is normal weight.  HENT:     Head: Normocephalic and atraumatic.     Nose: Nose normal.     Mouth/Throat:     Mouth: Mucous membranes are moist.     Pharynx: Oropharynx is clear.  Eyes:     Extraocular Movements: Extraocular movements intact.     Conjunctiva/sclera: Conjunctivae normal.     Pupils: Pupils are equal, round, and  reactive to light.  Cardiovascular:     Rate and Rhythm: Normal rate and regular rhythm.     Pulses: Normal pulses.     Heart sounds: Normal heart sounds.  Pulmonary:     Effort: Pulmonary effort is normal.     Breath sounds: Normal breath sounds.  Abdominal:     General: Abdomen is flat. Bowel sounds are normal.     Palpations: Abdomen is soft.  Musculoskeletal:        General: Normal range of motion.     Cervical back: Normal range of motion.  Skin:    General: Skin is  warm and dry.  Neurological:     General: No focal deficit present.     Mental Status: He is alert and oriented to person, place, and time.  Psychiatric:        Mood and Affect: Mood normal.        Behavior: Behavior normal.        Thought Content: Thought content normal.        Judgment: Judgment normal.      No results found for any visits on 04/25/24.  Recent Results (from the past 2160 hours)  CBC with Differential (Cancer Center Only)     Status: None   Collection Time: 02/26/24 12:56 PM  Result Value Ref Range   WBC Count 5.8 4.0 - 10.5 K/uL   RBC 4.69 4.22 - 5.81 MIL/uL   Hemoglobin 13.8 13.0 - 17.0 g/dL   HCT 57.4 60.9 - 47.9 %   MCV 90.6 80.0 - 100.0 fL   MCH 29.4 26.0 - 34.0 pg   MCHC 32.5 30.0 - 36.0 g/dL   RDW 86.0 88.4 - 84.4 %   Platelet Count 227 150 - 400 K/uL   nRBC 0.0 0.0 - 0.2 %   Neutrophils Relative % 64 %   Neutro Abs 3.7 1.7 - 7.7 K/uL   Lymphocytes Relative 26 %   Lymphs Abs 1.5 0.7 - 4.0 K/uL   Monocytes Relative 5 %   Monocytes Absolute 0.3 0.1 - 1.0 K/uL   Eosinophils Relative 4 %   Eosinophils Absolute 0.2 0.0 - 0.5 K/uL   Basophils Relative 1 %   Basophils Absolute 0.0 0.0 - 0.1 K/uL   Immature Granulocytes 0 %   Abs Immature Granulocytes 0.01 0.00 - 0.07 K/uL    Comment: Performed at [facility], [address] [place], [place] [address]  CMP (Cancer Center only)     Status: Abnormal   Collection Time: 02/26/24 12:56 PM  Result Value Ref Range    Sodium 136 135 - 145 mmol/L   Potassium 4.2 3.5 - 5.1 mmol/L   Chloride 104 98 - 111 mmol/L   CO2 23 22 - 32 mmol/L   Glucose, Bld 101 (H) 70 - 99 mg/dL    Comment: Glucose reference range applies only to samples taken after fasting for at least 8 hours.   BUN 14 6 - 20 mg/dL   Creatinine 9.06 9.38 - 1.24 mg/dL   Calcium  9.3 8.9 - 10.3 mg/dL   Total Protein 7.4 6.5 - 8.1 g/dL   Albumin  4.3 3.5 - 5.0 g/dL   AST 23 15 - 41 U/L   ALT 14 0 - 44 U/L   Alkaline Phosphatase 65 38 - 126 U/L   Total Bilirubin 0.5 0.0 - 1.2 mg/dL   GFR, Estimated >39 >39 mL/min    Comment: (NOTE) Calculated using the CKD-EPI Creatinine Equation (2021)    Anion gap 9 5 - 15    Comment: Performed at [facility], [address] [place], [place] [address]  CEA     Status: Abnormal   Collection Time: 02/26/24 12:56 PM  Result Value Ref Range   CEA 6.6 (H) 0.0 - 4.7 ng/mL    Comment: (NOTE)                             Nonsmokers          <3.9  Smokers             <5.6 [employer] Electrochemiluminescence Immunoassay (ECLIA) Values obtained with different assay methods or kits cannot be used interchangeably.  Results cannot be interpreted as absolute evidence of the presence or absence of malignant disease. Performed At: [facility] [address] [place], [place] [id number] Jennette Shorter MD Ey:[phone]       Assessment & Plan:  Saline nasal spray Lab work today Continue current medications  Problem List Items Addressed This Visit       Other   Bipolar 1 disorder (HCC)   Nicotine  dependence   Mixed hyperlipidemia   Other Visit Diagnoses       Diabetes mellitus screening    -  Primary   Relevant Orders   Hemoglobin A1c     Thyroid disorder screening       Relevant Orders   TSH     Lipid screening       Relevant Orders   Lipid Profile       Return in about 4 months (around 08/23/2024) for fasting lab work prior.   Total time  spent: 25 minutes. This time includes review of previous notes and results and patient face to face interaction during today's visit.    Jeoffrey Pollen, NP  04/25/2024   This document may have been prepared by Dragon Voice Recognition software and as such may include unintentional dictation errors.     [1]  Allergies Allergen Reactions   Geodon [Ziprasidone Hcl] Anaphylaxis  [2]  Outpatient Medications Prior to Visit  Medication Sig   acetaminophen  (TYLENOL ) 325 MG tablet Take 2 tablets (650 mg total) by mouth every 6 (six) hours as needed for mild pain or moderate pain.   ARIPiprazole  ER (ABILIFY  MAINTENA) 400 MG PRSY prefilled syringe Inject 400 mg into the muscle every 30 (thirty) days.   benztropine  (COGENTIN ) 2 MG tablet Take 2 mg by mouth at bedtime.   busPIRone  (BUSPAR ) 10 MG tablet Take 10 mg by mouth 2 (two) times daily.   FLUoxetine  (PROZAC ) 20 MG capsule Take 20 mg by mouth daily.   ibuprofen  (ADVIL ) 600 MG tablet Take 600 mg by mouth every 6 (six) hours as needed for moderate pain.   ibuprofen  (ADVIL ) 800 MG tablet Take 1 tablet (800 mg total) by mouth every 8 (eight) hours as needed.   lidocaine -prilocaine  (EMLA ) cream Apply 1 Application topically See admin instructions. Apply to affected area once as directed 1 hour before the pt. Comes to get chemo   Melatonin 10 MG CAPS Take 10 mg by mouth at bedtime.   ondansetron  (ZOFRAN ) 8 MG tablet Take 8 mg by mouth 2 (two) times daily as needed for nausea or vomiting.   pantoprazole  (PROTONIX ) 20 MG tablet Take 1 tablet (20 mg total) by mouth daily.   polyethylene glycol (MIRALAX  / GLYCOLAX ) 17 g packet Take 17 g by mouth daily as needed (constipation).   prazosin  (MINIPRESS ) 2 MG capsule Take 2 mg by mouth 2 (two) times daily.   prochlorperazine  (COMPAZINE ) 10 MG tablet Take 10 mg by mouth every 6 (six) hours as needed for nausea or vomiting.   No facility-administered medications prior to visit.   "

## 2024-04-26 LAB — HEMOGLOBIN A1C
Est. average glucose Bld gHb Est-mCnc: 111 mg/dL
Hgb A1c MFr Bld: 5.5 % (ref 4.8–5.6)

## 2024-04-26 LAB — LIPID PANEL
Chol/HDL Ratio: 4.7 ratio (ref 0.0–5.0)
Cholesterol, Total: 199 mg/dL (ref 100–199)
HDL: 42 mg/dL
LDL Chol Calc (NIH): 98 mg/dL (ref 0–99)
Triglycerides: 355 mg/dL — ABNORMAL HIGH (ref 0–149)
VLDL Cholesterol Cal: 59 mg/dL — ABNORMAL HIGH (ref 5–40)

## 2024-04-26 LAB — TSH: TSH: 1.1 u[IU]/mL (ref 0.450–4.500)

## 2024-04-28 ENCOUNTER — Other Ambulatory Visit: Payer: Self-pay | Admitting: Cardiology

## 2024-04-28 ENCOUNTER — Ambulatory Visit: Payer: Self-pay | Admitting: Cardiology

## 2024-04-28 MED ORDER — SALINE SPRAY 0.65 % NA SOLN: 1.0000 | Freq: Every day | NASAL | 1 refills | Status: AC

## 2024-05-02 ENCOUNTER — Telehealth: Payer: Self-pay

## 2024-05-02 NOTE — Telephone Encounter (Signed)
 We have the patients FL2 and paper work ready for pick up, copies of the paper work have been made

## 2024-05-05 ENCOUNTER — Inpatient Hospital Stay: Payer: MEDICAID

## 2024-05-05 ENCOUNTER — Inpatient Hospital Stay: Payer: MEDICAID | Attending: Oncology

## 2024-05-05 ENCOUNTER — Ambulatory Visit
Admission: RE | Admit: 2024-05-05 | Discharge: 2024-05-05 | Disposition: A | Discharge status: Home / Self Care | Payer: MEDICAID | Source: Ambulatory Visit | Attending: Oncology | Admitting: Oncology

## 2024-05-05 DIAGNOSIS — R918 Other nonspecific abnormal finding of lung field: Secondary | ICD-10-CM | POA: Insufficient documentation

## 2024-05-05 DIAGNOSIS — C189 Malignant neoplasm of colon, unspecified: Secondary | ICD-10-CM | POA: Diagnosis present

## 2024-05-05 DIAGNOSIS — C786 Secondary malignant neoplasm of retroperitoneum and peritoneum: Secondary | ICD-10-CM | POA: Diagnosis not present

## 2024-05-05 LAB — CBC WITH DIFFERENTIAL (CANCER CENTER ONLY)
Abs Immature Granulocytes: 0.01 K/uL (ref 0.00–0.07)
Basophils Absolute: 0 K/uL (ref 0.0–0.1)
Basophils Relative: 0 %
Eosinophils Absolute: 0.2 K/uL (ref 0.0–0.5)
Eosinophils Relative: 3 %
HCT: 42.7 % (ref 39.0–52.0)
Hemoglobin: 14 g/dL (ref 13.0–17.0)
Immature Granulocytes: 0 %
Lymphocytes Relative: 24 %
Lymphs Abs: 1.3 K/uL (ref 0.7–4.0)
MCH: 30 pg (ref 26.0–34.0)
MCHC: 32.8 g/dL (ref 30.0–36.0)
MCV: 91.4 fL (ref 80.0–100.0)
Monocytes Absolute: 0.3 K/uL (ref 0.1–1.0)
Monocytes Relative: 6 %
Neutro Abs: 3.5 K/uL (ref 1.7–7.7)
Neutrophils Relative %: 67 %
Platelet Count: 211 K/uL (ref 150–400)
RBC: 4.67 MIL/uL (ref 4.22–5.81)
RDW: 12.7 % (ref 11.5–15.5)
WBC Count: 5.3 K/uL (ref 4.0–10.5)
nRBC: 0 % (ref 0.0–0.2)

## 2024-05-05 LAB — CMP (CANCER CENTER ONLY)
ALT: 9 U/L (ref 0–44)
AST: 22 U/L (ref 15–41)
Albumin: 4.6 g/dL (ref 3.5–5.0)
Alkaline Phosphatase: 86 U/L (ref 38–126)
Anion gap: 10 (ref 5–15)
BUN: 10 mg/dL (ref 6–20)
CO2: 26 mmol/L (ref 22–32)
Calcium: 9.6 mg/dL (ref 8.9–10.3)
Chloride: 101 mmol/L (ref 98–111)
Creatinine: 0.98 mg/dL (ref 0.61–1.24)
GFR, Estimated: >60 mL/min
Glucose, Bld: 81 mg/dL (ref 70–99)
Potassium: 4.5 mmol/L (ref 3.5–5.1)
Sodium: 137 mmol/L (ref 135–145)
Total Bilirubin: 0.3 mg/dL (ref 0.0–1.2)
Total Protein: 7.3 g/dL (ref 6.5–8.1)

## 2024-05-05 MED ORDER — IOHEXOL 300 MG/ML  SOLN
100.0000 mL | Freq: Once | INTRAMUSCULAR | Status: AC | PRN
  Administered 2024-05-05: 100 mL via INTRAVENOUS

## 2024-05-06 LAB — CEA: CEA: 29.8 ng/mL — ABNORMAL HIGH (ref 0.0–4.7)

## 2024-05-15 ENCOUNTER — Encounter: Payer: Self-pay | Admitting: Cardiology

## 2024-05-15 ENCOUNTER — Ambulatory Visit: Payer: MEDICAID | Admitting: Cardiology

## 2024-05-15 VITALS — BP 110/82 | HR 78 | Ht 72.0 in | Wt 200.0 lb

## 2024-05-15 DIAGNOSIS — C189 Malignant neoplasm of colon, unspecified: Secondary | ICD-10-CM

## 2024-05-15 DIAGNOSIS — E782 Mixed hyperlipidemia: Secondary | ICD-10-CM | POA: Diagnosis not present

## 2024-05-15 DIAGNOSIS — Z131 Encounter for screening for diabetes mellitus: Secondary | ICD-10-CM

## 2024-05-15 DIAGNOSIS — Z0130 Encounter for examination of blood pressure without abnormal findings: Secondary | ICD-10-CM

## 2024-05-15 DIAGNOSIS — F17210 Nicotine dependence, cigarettes, uncomplicated: Secondary | ICD-10-CM | POA: Diagnosis not present

## 2024-05-15 DIAGNOSIS — D508 Other iron deficiency anemias: Secondary | ICD-10-CM | POA: Diagnosis not present

## 2024-05-15 NOTE — Progress Notes (Signed)
 "  Established Patient Office Visit  Subjective:  Patient ID: Connor Wilkinson, male    DOB: April 26, 1985  Age: 39 y.o. MRN: [id number]  Chief Complaint  Patient presents with   Follow-up    Follow up     Patient in office for regular follow up. Patient doing well, no complaints today. Discussed recent lab work. Hgb A1c normal, TSH normal. LDL at goal. Triglycerides elevated, patient admits to not fasting for lab work.  Colonoscopy in 2024, was recommended to repeat in one year. Was not done. Will send referral to schedule.  Continue current medications.     No other concerns at this time.   Past Medical History:  Diagnosis Date   Bipolar 1 disorder (HCC)    Colon cancer (HCC)    Lung nodule 09/2022   PTSD (post-traumatic stress disorder)     Past Surgical History:  Procedure Laterality Date   BRONCHIAL NEEDLE ASPIRATION BIOPSY  10/12/2022   Procedure: BRONCHIAL NEEDLE ASPIRATION BIOPSIES;  Surgeon: Isadora Hose, MD;  Location: [facility] ENDOSCOPY;  Service: Pulmonary;;   COLON RESECTION N/A 07/12/2021   Procedure: HAND ASSISTED LAPAROSCOPIC COLON RESECTION;  Surgeon: Jordis Laneta FALCON, MD;  Location: [facility] ORS;  Service: General;  Laterality: N/A;   COLONOSCOPY WITH PROPOFOL  N/A 07/06/2021   Procedure: COLONOSCOPY WITH PROPOFOL ;  Surgeon: Unk Corinn Skiff, MD;  Location: [facility] ENDOSCOPY;  Service: Gastroenterology;  Laterality: N/A;   COLONOSCOPY WITH PROPOFOL  N/A 07/07/2021   Procedure: COLONOSCOPY WITH PROPOFOL ;  Surgeon: Unk Corinn Skiff, MD;  Location: [facility] ENDOSCOPY;  Service: Gastroenterology;  Laterality: N/A;   COLONOSCOPY WITH PROPOFOL  N/A 08/02/2022   Procedure: COLONOSCOPY WITH PROPOFOL ;  Surgeon: Unk Corinn Skiff, MD;  Location: [facility] ENDOSCOPY;  Service: Gastroenterology;  Laterality: N/AMERL Lung (Caregiver & Transporter) [phone] [name] [phone] will be available for phone consent   ESOPHAGOGASTRODUODENOSCOPY (EGD) WITH PROPOFOL  N/A 07/06/2021    Procedure: ESOPHAGOGASTRODUODENOSCOPY (EGD) WITH PROPOFOL ;  Surgeon: Unk Corinn Skiff, MD;  Location: [facility] ENDOSCOPY;  Service: Gastroenterology;  Laterality: N/A;   GIVENS CAPSULE STUDY N/A 07/06/2021   Procedure: GIVENS CAPSULE STUDY;  Surgeon: Unk Corinn Skiff, MD;  Location: [facility] ENDOSCOPY;  Service: Gastroenterology;  Laterality: N/A;   IR IMAGING GUIDED PORT INSERTION  01/19/2023   IR REMOVAL TUN ACCESS W/ PORT W/O FL MOD SED  02/15/2022   PORTACATH PLACEMENT Right 07/22/2021   Procedure: INSERTION PORT-A-CATH;  Surgeon: Jordis Laneta FALCON, MD;  Location: [facility] ORS;  Service: General;  Laterality: Right;    Social History   Socioeconomic History   Marital status: Single    Spouse name: Not on file   Number of children: Not on file   Years of education: Not on file   Highest education level: Not on file  Occupational History   Not on file  Tobacco Use   Smoking status: Every Day    Current packs/day: 0.25    Types: Cigarettes   Smokeless tobacco: Current   Tobacco comments:    4 cigarettes a day - khj 09/28/2022  Vaping Use   Vaping status: Every Day   Devices: elfbar  Substance and Sexual Activity   Alcohol use: Not Currently   Drug use: Not Currently   Sexual activity: Not Currently  Other Topics Concern   Not on file  Social History Narrative   Not on file   Social Drivers of Health   Tobacco Use: High Risk (05/15/2024)   Patient History    Smoking Tobacco Use: Every Day  Smokeless Tobacco Use: Current    Passive Exposure: Not on file  Financial Resource Strain: Not on file  Food Insecurity: No Food Insecurity (02/26/2024)   Epic    Worried About Programme Researcher, Broadcasting/film/video in the Last Year: Never true    Ran Out of Food in the Last Year: Never true  Transportation Needs: No Transportation Needs (02/26/2024)   Epic    Lack of Transportation (Medical): No    Lack of Transportation (Non-Medical): No  Physical Activity: Not on file  Stress: Not on file  Social  Connections: Not on file  Intimate Partner Violence: Not At Risk (02/26/2024)   Epic    Fear of Current or Ex-Partner: No    Emotionally Abused: No    Physically Abused: No    Sexually Abused: No  Depression (PHQ2-9): Low Risk (02/26/2024)   Depression (PHQ2-9)    PHQ-2 Score: 0  Alcohol Screen: Not on file  Housing: Low Risk (02/26/2024)   Epic    Unable to Pay for Housing in the Last Year: No    Number of Times Moved in the Last Year: 0    Homeless in the Last Year: No  Utilities: Not At Risk (02/26/2024)   Epic    Threatened with loss of utilities: No  Health Literacy: Not on file    Family History  Problem Relation Age of Onset   Kidney cancer Father    Breast cancer Maternal Aunt     Allergies[1]  Show/hide medication list[2]  Review of Systems  Constitutional: Negative.   HENT: Negative.    Eyes: Negative.   Respiratory: Negative.  Negative for shortness of breath.   Cardiovascular: Negative.  Negative for chest pain.  Gastrointestinal: Negative.  Negative for abdominal pain, constipation and diarrhea.  Genitourinary: Negative.   Musculoskeletal:  Negative for joint pain and myalgias.  Skin: Negative.   Neurological: Negative.  Negative for dizziness and headaches.  Endo/Heme/Allergies: Negative.   All other systems reviewed and are negative.      Objective:   BP 110/82   Pulse 78   Ht 6' (1.829 m)   Wt 200 lb (90.7 kg)   SpO2 98%   BMI 27.12 kg/m   Vitals:   05/15/24 1322  BP: 110/82  Pulse: 78  Height: 6' (1.829 m)  Weight: 200 lb (90.7 kg)  SpO2: 98%  BMI (Calculated): 27.12    Physical Exam Nursing note reviewed.  Constitutional:      Appearance: Normal appearance. He is normal weight.  HENT:     Head: Normocephalic and atraumatic.     Nose: Nose normal.     Mouth/Throat:     Mouth: Mucous membranes are moist.     Pharynx: Oropharynx is clear.  Eyes:     Extraocular Movements: Extraocular movements intact.      Conjunctiva/sclera: Conjunctivae normal.     Pupils: Pupils are equal, round, and reactive to light.  Cardiovascular:     Rate and Rhythm: Normal rate and regular rhythm.     Pulses: Normal pulses.     Heart sounds: Normal heart sounds.  Pulmonary:     Effort: Pulmonary effort is normal.     Breath sounds: Normal breath sounds.  Abdominal:     General: Abdomen is flat. Bowel sounds are normal.     Palpations: Abdomen is soft.  Musculoskeletal:        General: Normal range of motion.     Cervical back: Normal range of motion.  Skin:    General: Skin is warm and dry.  Neurological:     General: No focal deficit present.     Mental Status: He is alert and oriented to person, place, and time.  Psychiatric:        Mood and Affect: Mood normal.        Behavior: Behavior normal.        Thought Content: Thought content normal.        Judgment: Judgment normal.      No results found for any visits on 05/15/24.  Recent Results (from the past 2160 hours)  CBC with Differential (Cancer Center Only)     Status: None   Collection Time: 02/26/24 12:56 PM  Result Value Ref Range   WBC Count 5.8 4.0 - 10.5 K/uL   RBC 4.69 4.22 - 5.81 MIL/uL   Hemoglobin 13.8 13.0 - 17.0 g/dL   HCT 57.4 60.9 - 47.9 %   MCV 90.6 80.0 - 100.0 fL   MCH 29.4 26.0 - 34.0 pg   MCHC 32.5 30.0 - 36.0 g/dL   RDW 86.0 88.4 - 84.4 %   Platelet Count 227 150 - 400 K/uL   nRBC 0.0 0.0 - 0.2 %   Neutrophils Relative % 64 %   Neutro Abs 3.7 1.7 - 7.7 K/uL   Lymphocytes Relative 26 %   Lymphs Abs 1.5 0.7 - 4.0 K/uL   Monocytes Relative 5 %   Monocytes Absolute 0.3 0.1 - 1.0 K/uL   Eosinophils Relative 4 %   Eosinophils Absolute 0.2 0.0 - 0.5 K/uL   Basophils Relative 1 %   Basophils Absolute 0.0 0.0 - 0.1 K/uL   Immature Granulocytes 0 %   Abs Immature Granulocytes 0.01 0.00 - 0.07 K/uL    Comment: Performed at [facility], [address] [place], [place] [address]  CMP (Cancer Center only)      Status: Abnormal   Collection Time: 02/26/24 12:56 PM  Result Value Ref Range   Sodium 136 135 - 145 mmol/L   Potassium 4.2 3.5 - 5.1 mmol/L   Chloride 104 98 - 111 mmol/L   CO2 23 22 - 32 mmol/L   Glucose, Bld 101 (H) 70 - 99 mg/dL    Comment: Glucose reference range applies only to samples taken after fasting for at least 8 hours.   BUN 14 6 - 20 mg/dL   Creatinine 9.06 9.38 - 1.24 mg/dL   Calcium  9.3 8.9 - 10.3 mg/dL   Total Protein 7.4 6.5 - 8.1 g/dL   Albumin  4.3 3.5 - 5.0 g/dL   AST 23 15 - 41 U/L   ALT 14 0 - 44 U/L   Alkaline Phosphatase 65 38 - 126 U/L   Total Bilirubin 0.5 0.0 - 1.2 mg/dL   GFR, Estimated >39 >39 mL/min    Comment: (NOTE) Calculated using the CKD-EPI Creatinine Equation (2021)    Anion gap 9 5 - 15    Comment: Performed at [facility], [address] [place], [place] [address]  CEA     Status: Abnormal   Collection Time: 02/26/24 12:56 PM  Result Value Ref Range   CEA 6.6 (H) 0.0 - 4.7 ng/mL    Comment: (NOTE)                             Nonsmokers          <3.9  Smokers             <5.6 [employer] Electrochemiluminescence Immunoassay (ECLIA) Values obtained with different assay methods or kits cannot be used interchangeably.  Results cannot be interpreted as absolute evidence of the presence or absence of malignant disease. Performed At: [facility] [address] [place], [place] [id number] Jennette Shorter MD Ey:[phone]   Hemoglobin A1c     Status: None   Collection Time: 04/25/24  1:47 PM  Result Value Ref Range   Hgb A1c MFr Bld 5.5 4.8 - 5.6 %    Comment:          Prediabetes: 5.7 - 6.4          Diabetes: >6.4          Glycemic control for adults with diabetes: <7.0    Est. average glucose Bld gHb Est-mCnc 111 mg/dL  Lipid Profile     Status: Abnormal   Collection Time: 04/25/24  1:47 PM  Result Value Ref Range   Cholesterol, Total 199 100 - 199 mg/dL   Triglycerides 644 (H) 0 -  149 mg/dL   HDL 42 >60 mg/dL   VLDL Cholesterol Cal 59 (H) 5 - 40 mg/dL   LDL Chol Calc (NIH) 98 0 - 99 mg/dL   Chol/HDL Ratio 4.7 0.0 - 5.0 ratio    Comment:                                   T. Chol/HDL Ratio                                             Men  Women                               1/2 Avg.Risk  3.4    3.3                                   Avg.Risk  5.0    4.4                                2X Avg.Risk  9.6    7.1                                3X Avg.Risk 23.4   11.0   TSH     Status: None   Collection Time: 04/25/24  1:47 PM  Result Value Ref Range   TSH 1.100 0.450 - 4.500 uIU/mL  CBC with Differential (Cancer Center Only)     Status: None   Collection Time: 05/05/24  1:07 PM  Result Value Ref Range   WBC Count 5.3 4.0 - 10.5 K/uL   RBC 4.67 4.22 - 5.81 MIL/uL   Hemoglobin 14.0 13.0 - 17.0 g/dL   HCT 57.2 60.9 - 47.9 %   MCV 91.4 80.0 - 100.0 fL   MCH 30.0 26.0 - 34.0 pg   MCHC 32.8 30.0 - 36.0 g/dL   RDW 87.2 88.4 - 84.4 %   Platelet Count 211 150 -  400 K/uL   nRBC 0.0 0.0 - 0.2 %   Neutrophils Relative % 67 %   Neutro Abs 3.5 1.7 - 7.7 K/uL   Lymphocytes Relative 24 %   Lymphs Abs 1.3 0.7 - 4.0 K/uL   Monocytes Relative 6 %   Monocytes Absolute 0.3 0.1 - 1.0 K/uL   Eosinophils Relative 3 %   Eosinophils Absolute 0.2 0.0 - 0.5 K/uL   Basophils Relative 0 %   Basophils Absolute 0.0 0.0 - 0.1 K/uL   Immature Granulocytes 0 %   Abs Immature Granulocytes 0.01 0.00 - 0.07 K/uL    Comment: Performed at [facility], [address] [place], [place] [address]  CMP (Cancer Center only)     Status: None   Collection Time: 05/05/24  1:07 PM  Result Value Ref Range   Sodium 137 135 - 145 mmol/L   Potassium 4.5 3.5 - 5.1 mmol/L   Chloride 101 98 - 111 mmol/L   CO2 26 22 - 32 mmol/L   Glucose, Bld 81 70 - 99 mg/dL    Comment: Glucose reference range applies only to samples taken after fasting for at least 8 hours.   BUN 10 6 - 20 mg/dL   Creatinine 9.01  9.38 - 1.24 mg/dL   Calcium  9.6 8.9 - 10.3 mg/dL   Total Protein 7.3 6.5 - 8.1 g/dL   Albumin  4.6 3.5 - 5.0 g/dL   AST 22 15 - 41 U/L   ALT 9 0 - 44 U/L   Alkaline Phosphatase 86 38 - 126 U/L   Total Bilirubin 0.3 0.0 - 1.2 mg/dL   GFR, Estimated >39 >39 mL/min    Comment: (NOTE) Calculated using the CKD-EPI Creatinine Equation (2021)    Anion gap 10 5 - 15    Comment: Performed at [facility], [address] [place], [place] [address]  CEA     Status: Abnormal   Collection Time: 05/05/24  1:07 PM  Result Value Ref Range   CEA 29.8 (H) 0.0 - 4.7 ng/mL    Comment: (NOTE)                             Nonsmokers          <3.9                             Smokers             <5.6 [employer] Electrochemiluminescence Immunoassay (ECLIA) Values obtained with different assay methods or kits cannot be used interchangeably.  Results cannot be interpreted as absolute evidence of the presence or absence of malignant disease. Performed At: [facility] [address] [place], [place] [id number] Jennette Shorter MD Ey:[id number]       Assessment & Plan:  Referral sent for repeat colonoscopy Continue current medications  Problem List Items Addressed This Visit       Digestive   Adenocarcinoma of colon [facility])   Relevant Orders   Ambulatory referral to Gastroenterology     Other   Iron  deficiency anemia   Nicotine  dependence   Mixed hyperlipidemia - Primary    Return in about 4 months (around 09/12/2024) for fasting lab work prior.   Total time spent: 25 minutes. This time includes review of previous notes and results and patient face to face interaction during today's visit.    Linc Renne, NP  05/15/2024   This document may have been prepared by Dragon Voice Recognition software and as such may include unintentional dictation errors.     [1]  Allergies Allergen Reactions   Geodon [Ziprasidone Hcl] Anaphylaxis  [2]  Outpatient Medications  Prior to Visit  Medication Sig   acetaminophen  (TYLENOL ) 325 MG tablet Take 2 tablets (650 mg total) by mouth every 6 (six) hours as needed for mild pain or moderate pain.   ARIPiprazole  ER (ABILIFY  MAINTENA) 400 MG PRSY prefilled syringe Inject 400 mg into the muscle every 30 (thirty) days.   benztropine  (COGENTIN ) 2 MG tablet Take 2 mg by mouth at bedtime.   busPIRone  (BUSPAR ) 10 MG tablet Take 10 mg by mouth 2 (two) times daily.   divalproex (DEPAKOTE) 500 MG DR tablet Take 500 mg by mouth 2 (two) times daily.   FLUoxetine  (PROZAC ) 20 MG capsule Take 20 mg by mouth daily.   Melatonin 10 MG CAPS Take 10 mg by mouth at bedtime.   pantoprazole  (PROTONIX ) 20 MG tablet Take 1 tablet (20 mg total) by mouth daily.   prazosin  (MINIPRESS ) 2 MG capsule Take 2 mg by mouth 2 (two) times daily.   sodium chloride  (OCEAN) 0.65 % SOLN nasal spray Place 1 spray into both nostrils daily.   ibuprofen  (ADVIL ) 600 MG tablet Take 600 mg by mouth every 6 (six) hours as needed for moderate pain.   ibuprofen  (ADVIL ) 800 MG tablet Take 1 tablet (800 mg total) by mouth every 8 (eight) hours as needed.   prochlorperazine  (COMPAZINE ) 10 MG tablet Take 10 mg by mouth every 6 (six) hours as needed for nausea or vomiting.   [DISCONTINUED] lidocaine -prilocaine  (EMLA ) cream Apply 1 Application topically See admin instructions. Apply to affected area once as directed 1 hour before the pt. Comes to get chemo (Patient not taking: Reported on 05/15/2024)   [DISCONTINUED] ondansetron  (ZOFRAN ) 8 MG tablet Take 8 mg by mouth 2 (two) times daily as needed for nausea or vomiting. (Patient not taking: Reported on 05/15/2024)   [DISCONTINUED] polyethylene glycol (MIRALAX  / GLYCOLAX ) 17 g packet Take 17 g by mouth daily as needed (constipation). (Patient not taking: Reported on 05/15/2024)   No facility-administered medications prior to visit.   "

## 2024-05-16 ENCOUNTER — Telehealth: Payer: Self-pay | Admitting: Oncology

## 2024-05-16 NOTE — Telephone Encounter (Signed)
 Left vm with Stephania (patients legal guardian) in regards to pts appt on Monday morning. We are having a delayed opening and need to reschedule his appt. Dr. Melanee said to move him to 3:15 on Monday. I left this information with diamond and asked her to call me back to confirm if this would work.

## 2024-05-18 ENCOUNTER — Telehealth: Payer: Self-pay | Admitting: Oncology

## 2024-05-18 NOTE — Telephone Encounter (Signed)
 Called pts Legal guardian and lvm about change in appt. We are closed Monday and he has been rescheduled to Thursday 2/5 at 845 I left my number with LG and asked her to call me back

## 2024-05-19 ENCOUNTER — Inpatient Hospital Stay: Payer: MEDICAID | Admitting: Oncology

## 2024-05-19 ENCOUNTER — Encounter: Payer: Self-pay | Admitting: Oncology

## 2024-05-19 NOTE — Telephone Encounter (Signed)
 error

## 2024-05-22 ENCOUNTER — Inpatient Hospital Stay: Payer: MEDICAID | Attending: Oncology | Admitting: Oncology

## 2024-05-22 ENCOUNTER — Telehealth: Payer: Self-pay

## 2024-05-22 ENCOUNTER — Encounter: Payer: Self-pay | Admitting: Oncology

## 2024-05-22 VITALS — BP 130/96 | HR 87 | Temp 97.0°F | Resp 19 | Ht 72.0 in | Wt 201.4 lb

## 2024-05-22 DIAGNOSIS — Z7189 Other specified counseling: Secondary | ICD-10-CM

## 2024-05-22 DIAGNOSIS — C189 Malignant neoplasm of colon, unspecified: Secondary | ICD-10-CM

## 2024-05-22 MED ORDER — PROCHLORPERAZINE MALEATE 10 MG PO TABS: 10.0000 mg | ORAL_TABLET | Freq: Four times a day (QID) | ORAL | 1 refills | Status: AC | PRN

## 2024-05-22 MED ORDER — LIDOCAINE-PRILOCAINE 2.5-2.5 % EX CREA: TOPICAL_CREAM | CUTANEOUS | 3 refills | Status: AC

## 2024-05-22 MED ORDER — ONDANSETRON HCL 8 MG PO TABS: 8.0000 mg | ORAL_TABLET | Freq: Three times a day (TID) | ORAL | 1 refills | Status: AC | PRN

## 2024-05-22 NOTE — Patient Instructions (Signed)
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 Your recent tests show increased lymph node involvement and growth of a lung lesion, indicating that your disease is progressing. We have decided to start you on a new treatment regimen.  YOUR PLAN: -METASTATIC MSI-HIGH COLON CANCER: Metastatic MSI-high colon cancer is an advanced form of colon cancer that has spread to other parts of the body and has a high level of microsatellite instability, which means it may respond well to immunotherapy. Given the progression of your disease, we will start you on a combination of immunotherapy drugs, ipilimumab and nivolumab, every three weeks for four cycles. After completing the four cycles, we will discontinue ipilimumab and continue with nivolumab alone every four weeks for about two years. We will closely monitor you for any side effects, including skin rash, diarrhea, and organ dysfunction, and regularly check your blood counts and organ function. We have obtained consent from your legal guardian to proceed with this treatment, and we plan to start on Monday, June 02, 2024, pending final consent and logistical arrangements.  INSTRUCTIONS: Please ensure that your legal guardian is available to provide final consent for your treatment. We will begin your immunotherapy on Monday, June 02, 2024. If you experience any side effects such as skin rash, diarrhea, or signs of organ dysfunction, please contact our office immediately.    Contains text generated by Abridge.   "

## 2024-05-22 NOTE — Progress Notes (Signed)
 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  He was found to have a hemoglobin of 4.5/18.7 on admission.  He received blood transfusion as well as IV iron  and his hemoglobin is presently up to an 8.  He underwent colonoscopy which showed malignant partially obstructing tumor that was circumferential 80 cm proximal to the anus.  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  CT chest abdomen and pelvis with contrast did not show any evidence of distant metastatic disease.  2 small sclerotic densities in the acetabulum and proximal right femur likely benign process.  Pathologically enlarged lymph nodes superior to circumferential colonic lesion suggesting metastatic adenopathy.   Patient underwent right hemicolectomy with Dr. Jordis on 07/12/2021.  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  Vermiform appendix negative for tumor.  Radial margin positive for tumor (2 lymph nodes at margin with adenocarcinoma).  Proximal and distal margins  negative.  7 out of 56 lymph nodes positive for metastatic adenocarcinoma.  Tumor deposits not applicable.  No lymphovascular or perineural invasion seen.  PT4APN2B.  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  BRAF testing was negative.  Genetic testing was offered to the patient but he declined   Fertility conservation was also discussed an option of sperm banking at [facility] was discussed with the patient and his healthcare power of attorney Connor Wilkinson.  There would be a one-time cost involved for sperm banking and a yearly cost for preserving the specimen.  Overall chances of infertility with FOLFOX is low but possible.  Patient and his healthcare power of attorney have discussed their options and have decided not to proceed with fertility conservation at this time   Patient completed 12 cycles of adjuvant FOLFOX chemotherapy in followed by adjuvant radiation therapy in September 2023    CT scan and PET scan showed concern for metastatic disease.  18 x 16 mm left lower lobe lung nodule with an SUV of 2.4.  Progressive abdominal/retroperitoneal adenopathy with lymph nodes measuring between 6 mm to 18 mm which were PET avid.  Lung biopsy was negative for malignancy.  Retroperitoneal adenopathy was biopsied and was consistent with mucinous adenocarcinoma of colon primary.  Tissue Tempus testing ATM, BRCA2, PIK3CA mutation.  MLH1 mutation. No evidence of K-ras BRAF or NRAS mutation.  Tumor mutational burden high 95th percentile MSI status could not be assessed because the tumor content is below 30%   Patient received 12 cycles of palliative FOLFIRI Avastin  chemotherapy ending in April 2025.  He also received radiation treatment/SBRT to the dominant left lower lobe lung nodules.  Patient has been on treatment break since April 2025.  Interval  history-he is doing well presently and denies any complaints at this time.  Appetite and weight have  remained stable.  Denies any nausea vomiting diarrhea.  ECOG PS- 0 Pain scale- 0   Review of systems- Review of Systems  Constitutional:  Negative for chills, fever, malaise/fatigue and weight loss.  HENT:  Negative for congestion, ear discharge and nosebleeds.   Eyes:  Negative for blurred vision.  Respiratory:  Negative for cough, hemoptysis, sputum production, shortness of breath and wheezing.   Cardiovascular:  Negative for chest pain, palpitations, orthopnea and claudication.  Gastrointestinal:  Negative for abdominal pain, blood in stool, constipation, diarrhea, heartburn, melena, nausea and vomiting.  Genitourinary:  Negative for dysuria, flank pain, frequency, hematuria and urgency.  Musculoskeletal:  Negative for back pain, joint pain and myalgias.  Skin:  Negative for rash.  Neurological:  Negative for dizziness, tingling, focal weakness, seizures, weakness and headaches.  Endo/Heme/Allergies:  Does not bruise/bleed easily.  Psychiatric/Behavioral:  Negative for depression and suicidal ideas. The patient does not have insomnia.       Allergies[1]   Past Medical History:  Diagnosis Date   Bipolar 1 disorder (HCC)    Colon cancer (HCC)    Lung nodule 09/2022   PTSD (post-traumatic stress disorder)      Past Surgical History:  Procedure Laterality Date   BRONCHIAL NEEDLE ASPIRATION BIOPSY  10/12/2022   Procedure: BRONCHIAL NEEDLE ASPIRATION BIOPSIES;  Surgeon: Isadora Hose, MD;  Location: [facility] ENDOSCOPY;  Service: Pulmonary;;   COLON RESECTION N/A 07/12/2021   Procedure: HAND ASSISTED LAPAROSCOPIC COLON RESECTION;  Surgeon: Jordis Laneta FALCON, MD;  Location: [facility] ORS;  Service: General;  Laterality: N/A;   COLONOSCOPY WITH PROPOFOL  N/A 07/06/2021   Procedure: COLONOSCOPY WITH PROPOFOL ;  Surgeon: Unk Corinn Skiff, MD;  Location: [facility] ENDOSCOPY;  Service: Gastroenterology;  Laterality: N/A;   COLONOSCOPY WITH PROPOFOL  N/A 07/07/2021   Procedure: COLONOSCOPY WITH PROPOFOL ;   Surgeon: Unk Corinn Skiff, MD;  Location: [facility] ENDOSCOPY;  Service: Gastroenterology;  Laterality: N/A;   COLONOSCOPY WITH PROPOFOL  N/A 08/02/2022   Procedure: COLONOSCOPY WITH PROPOFOL ;  Surgeon: Unk Corinn Skiff, MD;  Location: [facility] ENDOSCOPY;  Service: Gastroenterology;  Laterality: N/AMERL Lung (Caregiver & Transporter) [phone] [name] [phone] will be available for phone consent   ESOPHAGOGASTRODUODENOSCOPY (EGD) WITH PROPOFOL  N/A 07/06/2021   Procedure: ESOPHAGOGASTRODUODENOSCOPY (EGD) WITH PROPOFOL ;  Surgeon: Unk Corinn Skiff, MD;  Location: [facility] ENDOSCOPY;  Service: Gastroenterology;  Laterality: N/A;   GIVENS CAPSULE STUDY N/A 07/06/2021   Procedure: GIVENS CAPSULE STUDY;  Surgeon: Unk Corinn Skiff, MD;  Location: [facility] ENDOSCOPY;  Service: Gastroenterology;  Laterality: N/A;   IR IMAGING GUIDED PORT INSERTION  01/19/2023   IR REMOVAL TUN ACCESS W/ PORT W/O FL MOD SED  02/15/2022   PORTACATH PLACEMENT Right 07/22/2021   Procedure: INSERTION PORT-A-CATH;  Surgeon: Jordis Laneta FALCON, MD;  Location: [facility] ORS;  Service: General;  Laterality: Right;    Social History   Socioeconomic History   Marital status: Single    Spouse name: Not on file   Number of children: Not on file   Years of education: Not on file   Highest education level: Not on file  Occupational History   Not on file  Tobacco Use   Smoking status: Every Day    Current packs/day: 0.25    Types: Cigarettes   Smokeless tobacco: Current   Tobacco comments:    4 cigarettes a day - khj 09/28/2022  Vaping Use   Vaping status: Every  Day   Devices: elfbar  Substance and Sexual Activity   Alcohol use: Not Currently   Drug use: Not Currently   Sexual activity: Not Currently  Other Topics Concern   Not on file  Social History Narrative   Not on file   Social Drivers of Health   Tobacco Use: High Risk (05/22/2024)   Patient History    Smoking Tobacco Use: Every Day    Smokeless Tobacco Use:  Current    Passive Exposure: Not on file  Financial Resource Strain: Not on file  Food Insecurity: No Food Insecurity (02/26/2024)   Epic    Worried About Programme Researcher, Broadcasting/film/video in the Last Year: Never true    Ran Out of Food in the Last Year: Never true  Transportation Needs: No Transportation Needs (02/26/2024)   Epic    Lack of Transportation (Medical): No    Lack of Transportation (Non-Medical): No  Physical Activity: Not on file  Stress: Not on file  Social Connections: Not on file  Intimate Partner Violence: Not At Risk (02/26/2024)   Epic    Fear of Current or Ex-Partner: No    Emotionally Abused: No    Physically Abused: No    Sexually Abused: No  Depression (PHQ2-9): Low Risk (05/22/2024)   Depression (PHQ2-9)    PHQ-2 Score: 0  Alcohol Screen: Not on file  Housing: Low Risk (02/26/2024)   Epic    Unable to Pay for Housing in the Last Year: No    Number of Times Moved in the Last Year: 0    Homeless in the Last Year: No  Utilities: Not At Risk (02/26/2024)   Epic    Threatened with loss of utilities: No  Health Literacy: Not on file    Family History  Problem Relation Age of Onset   Kidney cancer Father    Breast cancer Maternal Aunt     Current Medications[2]  Physical exam:  Vitals:   05/22/24 0844  BP: (!) 130/96  Pulse: 87  Resp: 19  Temp: (!) 97 F (36.1 C)  TempSrc: Tympanic  SpO2: 98%  Weight: 201 lb 6.4 oz (91.4 kg)  Height: 6' (1.829 m)   Physical Exam Cardiovascular:     Rate and Rhythm: Normal rate and regular rhythm.     Heart sounds: Normal heart sounds.  Pulmonary:     Effort: Pulmonary effort is normal.     Breath sounds: Normal breath sounds.  Abdominal:     General: Bowel sounds are normal.     Palpations: Abdomen is soft.  Skin:    General: Skin is warm and dry.  Neurological:     Mental Status: He is alert and oriented to person, place, and time.      I have personally reviewed labs listed below:    Latest Ref Rng &  Units 05/05/2024    1:07 PM  CMP  Glucose 70 - 99 mg/dL 81   BUN 6 - 20 mg/dL 10   Creatinine 9.38 - 1.24 mg/dL 9.01   Sodium 864 - 854 mmol/L 137   Potassium 3.5 - 5.1 mmol/L 4.5   Chloride 98 - 111 mmol/L 101   CO2 22 - 32 mmol/L 26   Calcium  8.9 - 10.3 mg/dL 9.6   Total Protein 6.5 - 8.1 g/dL 7.3   Total Bilirubin 0.0 - 1.2 mg/dL 0.3   Alkaline Phos 38 - 126 U/L 86   AST 15 - 41 U/L 22   ALT 0 -  44 U/L 9       Latest Ref Rng & Units 05/05/2024    1:07 PM  CBC  WBC 4.0 - 10.5 K/uL 5.3   Hemoglobin 13.0 - 17.0 g/dL 85.9   Hematocrit 60.9 - 52.0 % 42.7   Platelets 150 - 400 K/uL 211    I have personally reviewed Radiology images listed below: No images are attached to the encounter.  CT CHEST ABDOMEN PELVIS W CONTRAST Result Date: 05/07/2024 CLINICAL DATA:  History of colon cancer, follow-up. * Tracking Code: BO * EXAM: CT CHEST, ABDOMEN, AND PELVIS WITH CONTRAST TECHNIQUE: Multidetector CT imaging of the chest, abdomen and pelvis was performed following the standard protocol during bolus administration of intravenous contrast. RADIATION DOSE REDUCTION: This exam was performed according to the departmental dose-optimization program which includes automated exposure control, adjustment of the mA and/or kV according to patient size and/or use of iterative reconstruction technique. CONTRAST:  OMNIPAQUE  IOHEXOL  300 MG/ML  SOLN COMPARISON:  Multiple priors including CT February 12, 2024 FINDINGS: CT CHEST FINDINGS Cardiovascular: Right chest Port-A-Cath with tip near the superior cavoatrial junction. Normal caliber thoracic aorta. Normal size heart. No significant pericardial effusion/thickening. Gas in the pulmonary outflow tract reflects sequela of vascular access. Mediastinum/Nodes: No supraclavicular adenopathy. No suspicious thyroid nodule. No pathologically enlarged mediastinal, hilar or axillary lymph nodes. Decreased crescentic feathery soft tissue in the anterior mediastinum  favored to reflect thymic rebound/remnant. Lungs/Pleura: Dominant nodule in the left lower lobe measures 2.1 x 1.9 cm on image 43/4 previously 2.1 x 2.2 cm. Additional subpleural left lower lobe pulmonary nodule in the medial left lower lobe measures 19 x 11 mm on image 43/4 previously 16 x 10 mm. Similar consolidation with air bronchograms in the perihilar left lower lobe on image 42/4. Nodular consolidation in the lingula similar to prior on image 48/4. No new suspicious pulmonary nodules or masses. No pleural effusion.  No pneumothorax. Musculoskeletal: No aggressive lytic or blastic lesion of bone. CT ABDOMEN PELVIS FINDINGS Hepatobiliary: No suspicious hepatic lesion. Gallbladder is unremarkable. No biliary ductal dilation. Pancreas: No pancreatic ductal dilation or evidence of acute inflammation. Spleen: No splenomegaly. Adrenals/Urinary Tract: No suspicious adrenal nodule/mass. No hydronephrosis. Kidneys demonstrate symmetric enhancement. Urinary bladder is unremarkable for degree of distension. Stomach/Bowel: Radiopaque enteric contrast material traverses the descending colon. Wall thickening versus underdistention of the gastric antrum. No pathologic dilation of small or large bowel. Prior right hemicolectomy with ileocolic anastomosis. No new suspicious nodularity along the suture line in the right upper quadrant. Vascular/Lymphatic: Partially calcified retroperitoneal lymph nodes are similar to prior for instance the dominant lymph node left para-aortic lymph node measuring 3.4 x 2.2 cm on image 79/2 previously measured 3.3 x 2.2 cm. However there are some new/enlarging retroperitoneal lymph nodes. For reference: -Heterogeneous retrocaval lymph node measuring 18 x 16 mm on image 72/2 previously 9 x 5 mm -Retroperitoneal lymph node to the left of the aorta anterior to the psoas muscle measures 12 by 11 mm on image 72/2 and is not confidently identifiable on prior examination -Precaval lymph node near the  bifurcation measuring 8 mm in short axis on image 86/2 which was not confidently identifiable on prior examination. Normal caliber abdominal aorta. Smooth IVC contours. Question nonocclusive thrombus in the SMV on image 76/2. Reproductive: Prostate is unremarkable. Other: No significant abdominopelvic free fluid. Musculoskeletal: No aggressive lytic or blastic lesion of bone. IMPRESSION: 1. New/enlarging retroperitoneal lymph nodes, concerning for progressive nodal metastatic disease. 2. Slight increase in size of  the solid subpleural pulmonary nodule in the medial left lower lobe with stable dominant left lower lobe pulmonary nodule. 3. Similar consolidation with air bronchograms in the perihilar left lower lobe and nodular consolidation in the lingula. 4. Prior right hemicolectomy with ileocolic anastomosis. No new suspicious nodularity along the suture line in the right upper quadrant. 5. Question nonocclusive thrombus in the SMV versus mixing artifact. Consider further evaluation with multiphase CTA of the abdomen and pelvis. 6. Wall thickening versus underdistention of the gastric antrum. Correlate for gastritis. 7.  Aortic Atherosclerosis (ICD10-I70.0). Electronically Signed   By: Reyes Holder M.D.   On: 05/07/2024 11:10     Assessment and plan- Patient is a 39 y.o. 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  He was noted to have mildly enlarging retroperitoneal adenopathy in October 2024 and was restarted on FOLFOX a Avastin  treatment with stable disease and treatment stopped in April 2025.  - Patient has been having slow progression of his disease over the last 6 months.  CEA has increased from 4.4 in June 20 25-6.6 in November 2025 and is  presently at 29.8.  I have reviewed CT chest abdomen pelvis images independently and discussed findings with the patient.  - Disease progression with increased retroperitoneal lymphadenopathy, growing left lung lesion, and elevated tumor markers. MSI-high status predicts favorable response to immunotherapy. Previously received chemotherapy, now candidate for immunotherapy.  - Will initiate combination immunotherapy with ipilimumab and nivolumab every three weeks for four cycles.  - In an open-label, multicohort phase II trial (CheckMate 142) trial, 119 patients were treated with nivolumab plus ipilimumab (four doses of nivolumab 3 mg/kg plus ipilimumab 1 mg/kg every three weeks, followed by nivolumab alone 3 mg/kg every two weeks) . Most patients (76 percent) had received two or more prior systemic therapies. At a median follow-up of 51 months, the ORR was 65 percent, including a complete and partial response rate of 13 and 52 percent, respectively [13]. Median duration of response was not reached. Four-year PFS and OS were 53 and 71 percent, respectively.  - After four cycles, discontinued ipilimumab, continued nivolumab monotherapy every four weeks for approximately two years.  Patient has a legal guardian and we will be getting in touch with them tomorrow over the phone to document verbal consent before proceeding with treatment  - Monitored for immune-related adverse events including skin rash, diarrhea, and organ dysfunction (kidney, liver, thyroid). - Monitored blood counts and organ function during therapy. - Obtained consent from legal guardian prior to initiation of therapy. - Planned to start immunotherapy on Monday, June 02, 2024, pending consent and logistical arrangements.         Visit Diagnosis 1. Adenocarcinoma of colon (HCC)   2. Goals of care, counseling/discussion      Dr. Annah Skene, MD, MPH [facility] [phone] 05/22/2024 9:48  AM                   [1]  Allergies Allergen Reactions   Geodon [Ziprasidone Hcl] Anaphylaxis  [2]  Current Outpatient Medications:    acetaminophen  (TYLENOL ) 325 MG tablet, Take 2 tablets (650 mg total) by mouth every 6 (six) hours as needed for mild pain or moderate pain., Disp: 30 tablet, Rfl: 0  ARIPiprazole  ER (ABILIFY  MAINTENA) 400 MG PRSY prefilled syringe, Inject 400 mg into the muscle every 30 (thirty) days., Disp: , Rfl:    benztropine  (COGENTIN ) 2 MG tablet, Take 2 mg by mouth at bedtime., Disp: , Rfl:    busPIRone  (BUSPAR ) 10 MG tablet, Take 10 mg by mouth 2 (two) times daily., Disp: , Rfl:    FLUoxetine  (PROZAC ) 20 MG capsule, Take 20 mg by mouth daily., Disp: , Rfl:    Melatonin 10 MG CAPS, Take 10 mg by mouth at bedtime., Disp: , Rfl:    pantoprazole  (PROTONIX ) 20 MG tablet, Take 1 tablet (20 mg total) by mouth daily., Disp: 30 tablet, Rfl: 3   prazosin  (MINIPRESS ) 2 MG capsule, Take 2 mg by mouth 2 (two) times daily., Disp: , Rfl:    sodium chloride  (OCEAN) 0.65 % SOLN nasal spray, Place 1 spray into both nostrils daily., Disp: 44 mL, Rfl: 1   divalproex (DEPAKOTE) 500 MG DR tablet, Take 500 mg by mouth 2 (two) times daily., Disp: , Rfl:    ibuprofen  (ADVIL ) 600 MG tablet, Take 600 mg by mouth every 6 (six) hours as needed for moderate pain., Disp: , Rfl:    ibuprofen  (ADVIL ) 800 MG tablet, Take 1 tablet (800 mg total) by mouth every 8 (eight) hours as needed., Disp: 30 tablet, Rfl: 0   lidocaine -prilocaine  (EMLA ) cream, Apply to affected area once, Disp: 30 g, Rfl: 3   ondansetron  (ZOFRAN ) 8 MG tablet, Take 1 tablet (8 mg total) by mouth every 8 (eight) hours as needed for nausea or vomiting., Disp: 30 tablet, Rfl: 1   prochlorperazine  (COMPAZINE ) 10 MG tablet, Take 10 mg by mouth every 6 (six) hours as needed for nausea or vomiting., Disp: , Rfl:    prochlorperazine  (COMPAZINE ) 10 MG tablet, Take 1 tablet (10 mg total) by mouth every 6 (six) hours as needed for  nausea or vomiting., Disp: 30 tablet, Rfl: 1  "

## 2024-05-22 NOTE — Telephone Encounter (Signed)
 Pt will start opdivo/ yervoy on 06/02/24.  Will need to contact legal guardian and set up a date / time for phone discussion to obtain consent for therapy.  Outbound call; spoke to Sheldon who indicated she had no availability today at all. Agreed to phone conversation tomorrow 05/23/24 at 4pm.  Provided telephone number in case she had any questions / needed to reschedule. Legal guardian verbalized understanding.

## 2024-05-22 NOTE — Progress Notes (Signed)
 DISCONTINUE ON PATHWAY REGIMEN - Colorectal     A cycle is every 14 days:     Bevacizumab -xxxx      Irinotecan       Leucovorin       Fluorouracil       Fluorouracil    **Always confirm dose/schedule in your pharmacy ordering system**  PRIOR TREATMENT: [id number]: FOLFIRI + Bevacizumab  q14 Days  START ON PATHWAY REGIMEN - Colorectal     Cycles 1 through 4: A cycle is every 21 days:     Nivolumab      Ipilimumab    Cycles 5 and beyond: A cycle is every 28 days:     Nivolumab   **Always confirm dose/schedule in your pharmacy ordering system**  Patient Characteristics: Distant Metastases, Nonsurgical Candidate, Non-KRAS G12C, RAS Mutation Positive/Unknown (BRAF V600 Wild-Type/Unknown), Targeted/Immunotherapy (MSI-H/dMMR or HER2-Positive), MSI-H/dMMR Therapeutic Status: Distant Metastases Tumor Location: Colon BRAF Mutation Status: Wild-Type (no mutation) KRAS/NRAS Mutation Status: Awaiting Test Results Microsatellite/Mismatch Repair Status: MSI-H/dMMR Preferred Therapy Approach: Targeted/Immunotherapy (MSI-H/dMMR or  HER2-Positive) HER2 Status: Negative Intent of Therapy: Non-Curative / Palliative Intent, Discussed with Patient

## 2024-05-23 NOTE — Progress Notes (Unsigned)
 Pharmacist Chemotherapy Monitoring - Initial Assessment    Anticipated start date: 06/01/24   The following has been reviewed per standard work regarding the patient's treatment regimen: The patient's diagnosis, treatment plan and drug doses, and organ/hematologic function Lab orders and baseline tests specific to treatment regimen  The treatment plan start date, drug sequencing, and pre-medications Prior authorization status  Patient's documented medication list, including drug-drug interaction screen and prescriptions for anti-emetics and supportive care specific to the treatment regimen The drug concentrations, fluid compatibility, administration routes, and timing of the medications to be used The patient's access for treatment and lifetime cumulative dose history, if applicable  The patient's medication allergies and previous infusion related reactions, if applicable  Metastatic MSI-high colon cancer - Disease progression with increased retroperitoneal lymphadenopathy, growing left lung lesion, and elevated tumor markers.  Previously received chemotherapy, now candidate for immunotherapy. combination immunotherapy with ipilimumab and nivolumab every three weeks  Changes made to treatment plan:  N/A  Follow up needed:  Pending authorization for treatment    Redell JINNY Gaskins, RPH, 05/23/2024  2:42 PM

## 2024-05-23 NOTE — Telephone Encounter (Signed)
 Outbound call to guardian Rico Jewett along with Dr. Melanee present.  Dr. Melanee discussed in detail proposed treatment plan of opdivo / yervoy immunotherapy.  Shelly agreed to move forward with treatment and requested specific details be emailed to her [email or web address].  Consent form completed.

## 2024-06-02 ENCOUNTER — Inpatient Hospital Stay: Payer: MEDICAID

## 2024-06-02 ENCOUNTER — Inpatient Hospital Stay: Payer: MEDICAID | Admitting: Oncology

## 2024-08-26 ENCOUNTER — Ambulatory Visit: Payer: MEDICAID | Admitting: Internal Medicine

## 2024-09-17 ENCOUNTER — Ambulatory Visit: Payer: MEDICAID | Admitting: Internal Medicine
